# Patient Record
Sex: Female | Born: 1985 | Race: Black or African American | Hispanic: No | Marital: Single
Health system: Midwestern US, Community
[De-identification: ages and names within clinical notes are randomized; demographics above are authoritative.]

## PROBLEM LIST (undated history)

## (undated) DIAGNOSIS — R10A1 Flank pain, right side: Secondary | ICD-10-CM

## (undated) DIAGNOSIS — N200 Calculus of kidney: Secondary | ICD-10-CM

## (undated) DIAGNOSIS — N189 Chronic kidney disease, unspecified: Secondary | ICD-10-CM

## (undated) DIAGNOSIS — E875 Hyperkalemia: Secondary | ICD-10-CM

## (undated) DIAGNOSIS — N186 End stage renal disease: Secondary | ICD-10-CM

## (undated) DIAGNOSIS — I10 Essential (primary) hypertension: Secondary | ICD-10-CM

## (undated) DIAGNOSIS — D62 Acute posthemorrhagic anemia: Secondary | ICD-10-CM

## (undated) DIAGNOSIS — M329 Systemic lupus erythematosus, unspecified: Secondary | ICD-10-CM

## (undated) DIAGNOSIS — N19 Unspecified kidney failure: Secondary | ICD-10-CM

## (undated) DIAGNOSIS — IMO0002 Reserved for concepts with insufficient information to code with codable children: Secondary | ICD-10-CM

## (undated) DIAGNOSIS — I871 Compression of vein: Secondary | ICD-10-CM

## (undated) HISTORY — PX: KIDNEY SURGERY: SHX687

## (undated) HISTORY — PX: CARDIAC SURGERY: SHX584

## (undated) NOTE — Unmapped External Note (Signed)
Formatting of this note might be different from the original.      The patient's case has been endorsed to me by Dr Jeannine Kitten, please see their initial patient care note.  Pt was discussed on rounds, plan of care agreed upon plan as follows:    CT AP and then Admit  Renal failure     Likely Disposition:Admit    SIGN OUT RE-EVALUATION:    Pt is to have a CT and then admit given they are in ESRD and no dialysis.     9:40 AM EST  FINDINGS/IMPRESSION:   *    Vicarious excretion of contrast within the gallbladder.  *    Numerous bilateral renal cystic lesions replacing most of the renal parenchyma. No hydroureteronephrosis. No obstructing renal calculi.  *    Left adnexal cyst measuring up to 3.0 cm.  *    Splenic cystic lesion. Mild gastric wall edema. Diffuse engorged subcutaneous vasculature.  *    No bowel obstruction or distention.      Preliminary findings were published to the electronic medical record by Tish Frederickson, MD.    9:54 AM EST  Pt still with nausea and generalized abd pain.  Noted to also have suprapubic tenderness and bilateral CVA tenderness.     No active vomiting.   Mild headache  Will add on a COVID/Flu panel.   IV morphine and antiemetics. Cosideration to treat UTI (although pt makes only a small amount of urine every day)  She has also missed her dialysi (since Wednesday) and she has M/W/F.   Admit for pain and   Diagnosis(es):  ESRD  Abdominal pain     Disposition:  Admit per sign out from Dr. Jeannine Kitten    Condition:   Stable    The patient verbalizes understanding of the diagnosis and disposition.    Pt will notify their PCP of the ED visit and schedule a follow up.     Pt should return for worsening symptoms.     If any controlled substances or new pain medications/ muscle relaxants are given, please read the insert from the pharmacy regarding activities while taking those medications.    Please do not drink or drive with these medications.   Follow up with your PCP regarding any new medications.      Pt appears to have medical capacity and all questions answered to their satisfaction    Note: translation line was used, if needed.     Alferd Patee, MD    Electronically signed by Alferd Patee, MD at 03/13/2022  9:57 AM EST

## (undated) NOTE — Nursing Note (Signed)
Formatting of this note might be different from the original.  Writer reviewed discharge instructions with pt. Pt packed up all her personal belongings herself. No personal belongings left in room.  Pt left discharge instructions on bed stating we did not do anything for her so she is not taking it.   Electronically signed by Simonne Come, LPN at [phone]  [id number] PM EDT

## (undated) NOTE — H&P (Signed)
Associated Order(s): ED Admission Consult to Hospitalist  Summary: Patient admitted for missed hemodialysis with hyperkalemia nausea and vomiting    Formatting of this note is different from the original.  Consult:  ED Admission Consult to Hospitalist  Consult performed by: Shirley Friar, MD  Consult ordered by: Renold Genta, MD    CC: Missed hemodialysis with dizziness and lightheadedness    History Of Present Illness:  Katherine [name] is a 43 y.o. female presenting with generalized weakness and lightheadedness associated with the fact that she has missed hemodialysis for over a week now.  Patient also reported swelling of the extremities bilaterally and generalized body aches and pains.  Patient reports that she is a traveling [place].  Reports nausea and vomiting but no diarrhea.  Lab work investigations in the ER showed hyperkalemia, 6.4Chest x-ray shows vascular congestion but EKG did not show any changes.  Case discussed with nephrology in the ER we will dialyze patient immediately.  Patient denies any focal weaknesses.    Past Medical History:  She has a past medical history of ESRD (end stage renal disease) (CMS/HCC) and Lupus (CMS/HCC) (HCC) (CMS/HCC).    Surgical History:  She has no past surgical history on file.    Social History:  She has no history on file for tobacco use, alcohol use, and drug use.  Her family history is not on file.    Allergies:  Ace inhibitors, Acetaminophen, Codeine, Fentanyl, Hydrocodone, Oxycodone, Sulfa (sulfonamide antibiotics), Nsaids (non-steroidal anti-inflammatory drug), and Zofran [ondansetron hcl]    Medications:  Prior to Admission medications    Medication Sig Start Date End Date Taking? Authorizing Provider   amLODIPine (NORVASC) 10 mg tablet Take ONE tablet by mouth 1 (one) time each day.    Historical Provider, MD   apixaban (ELIQUIS) 5 mg tablet Take ONE tablet by mouth every 12 (twelve) hours. 04/12/21   Historical Provider, MD   calcitrioL (ROCALTROL) 0.25  mcg capsule Take ONE capsule by mouth 3 (three) times a week. 04/14/21   Historical Provider, MD   losartan (COZAAR) 50 mg tablet Take ONE tablet by mouth 1 (one) time each day. 08/27/21   Historical Provider, MD   metoprolol succinate (TOPROL-XL) 25 mg 24 hr tablet Take ONE tablet by mouth 1 (one) time each day. 06/11/21   Historical Provider, MD       Review of Systems   Constitutional:  Positive for activity change, appetite change and fatigue. Negative for chills, fever and unexpected weight change.   HENT:  Negative for congestion, ear pain, sinus pain and sore throat.    Eyes:  Negative for pain, discharge and redness.   Respiratory:  Negative for cough, chest tightness, shortness of breath and wheezing.    Cardiovascular:  Negative for chest pain, palpitations and leg swelling.   Gastrointestinal:  Negative for abdominal pain, blood in stool, constipation, diarrhea, nausea and vomiting.   Endocrine: Negative for cold intolerance, heat intolerance, polydipsia, polyphagia and polyuria.   Genitourinary:  Negative for decreased urine volume, difficulty urinating, dysuria, flank pain, frequency, hematuria and urgency.   Musculoskeletal:  Negative for back pain, joint swelling, neck pain and neck stiffness.   Skin:  Negative for color change, rash and wound.   Allergic/Immunologic: Negative for immunocompromised state.   Neurological:  Negative for dizziness, tremors, speech difficulty, weakness, light-headedness, numbness and headaches.   Hematological:  Does not bruise/bleed easily.   Psychiatric/Behavioral:  Negative for agitation, confusion, hallucinations, sleep disturbance and suicidal ideas. The  patient is not nervous/anxious.      Last Recorded Vitals:  Blood pressure 135/86, pulse 96, temperature 36.7 C (98.1 F), temperature source Axillary, resp. rate 26, height 1.676 m (5' 6"$ ), SpO2 95 %.    Physical Exam  Constitutional:       General: She is not in acute distress.     Appearance: She is well-developed.  She is ill-appearing. She is not diaphoretic.   HENT:      Head: Normocephalic and atraumatic.      Nose: Nose normal.      Mouth/Throat:      Mouth: Mucous membranes are moist.      Pharynx: Oropharynx is clear.   Eyes:      General: No scleral icterus.     Extraocular Movements: Extraocular movements intact.      Conjunctiva/sclera: Conjunctivae normal.      Pupils: Pupils are equal, round, and reactive to light.   Neck:      Thyroid: No thyromegaly.      Vascular: No JVD.   Cardiovascular:      Rate and Rhythm: Normal rate and regular rhythm.      Pulses: Normal pulses.      Heart sounds: Murmur heard.      No friction rub. No gallop.   Pulmonary:      Effort: Pulmonary effort is normal. No respiratory distress.      Breath sounds: Normal breath sounds. No wheezing or rales.   Chest:      Chest wall: No tenderness.   Abdominal:      General: Bowel sounds are normal. There is no distension.      Palpations: Abdomen is soft. There is no mass.      Tenderness: There is no abdominal tenderness. There is no rebound.   Musculoskeletal:         General: Swelling present. Normal range of motion.      Cervical back: Normal range of motion and neck supple.      Right lower leg: Edema present.      Left lower leg: Edema present.      Comments: Status post right arm HD access   Lymphadenopathy:      Cervical: No cervical adenopathy.   Skin:     General: Skin is warm and dry.      Capillary Refill: Capillary refill takes less than 2 seconds.   Neurological:      General: No focal deficit present.      Mental Status: She is alert and oriented to person, place, and time. Mental status is at baseline.   Psychiatric:         Mood and Affect: Mood normal.     I reviewed the patient's labs, notes, and procedures.     Relevant Results:  I reviewed lab results and outside records for this visit and discussed relevant results with patient and/or family.  Recent Results (from the past 24 hour(s))   ECG 12 lead    Collection Time:  12/23/21  8:51 PM   Result Value Ref Range    Heart Rate 78 bpm    RR Interval 769 ms    Atrial Rate 77 ms    P-R Interval 218 ms    P Duration 136 ms    P Horizontal Axis 10 deg    P Front Axis 46 deg    Q Onset 510 ms    QRSD Interval 97 ms    QT Interval  405 ms    QTcB 462 ms    QTcF 442 ms    QRS Horizontal Axis -36 deg    QRS Axis 34 deg    I-40 Horizontal Axis 5 deg    I-40 Front Axis 29 deg    T-40 Horizontal Axis -78 deg    T-40 Front Axis -4 deg    T Horizontal Axis 42 deg    T Wave Axis 60 deg    S-T Horizontal Axis 74 deg    S-T Front Axis 76 deg   CBC    Collection Time: 12/23/21  9:53 PM   Result Value Ref Range    WBC  4.9 4.5 - 12.5 x10*3/uL    RBC 3.51 (L) 4.20 - 5.40 x10E6/uL    Hemoglobin 11.0 (L) 12.0 - 16.0 g/dL    Hematocrit 33.2 (L) 36.0 - 48.0 %    MCV 94.6 81.0 - 99.0 fL    MCH 31.3 (H) 27.0 - 31.0 pg    MCHC 33.1 31.0 - 36.0 g/dL    RDWSD 60.1 (H) 36.4 - 46.3 fL    Platelets 140 (L) 150 - 450 x10*3/uL    MPV 10.3 7.4 - 10.4 fL    RDWCV 17.7 (H) 11.7 - 14.4 %    nRBC 0.0 %    nRBC Absolute 0.00 0.00 - 0.01 x10*3/uL   Comprehensive Metabolic Panel    Collection Time: 12/23/21  9:53 PM   Result Value Ref Range    Sodium 137 136 - 145 mmol/L    Potassium 6.4 (HH) 3.4 - 4.9 mmol/L    Chloride 96 (L) 98 - 107 mmol/L    CO2 16 (L) 21 - 32 mmol/L    BUN 92 (H) 7 - 25 mg/dL    Creatinine 15.14 (HH) 0.60 - 1.30 mg/dL    Glucose, Random 63 (L) 74 - 109 mg/dL    Calcium 8.3 (L) 8.6 - 10.2 mg/dL    AST 16 13 - 39 U/L    ALT  7 7 - 52 U/L    Alkaline Phosphatase 84 30 - 105 U/L    Protein Total 8.1 6.4 - 8.9 g/dL    Albumin 4.4 3.5 - 5.7 g/dL    Bilirubin Total 0.4 0.3 - 1.0 mg/dL    eGFR 2.9 (L) >60.0 mL/min/1.55m2    Anion Gap 25 (H) 1 - 11 mmol/L   Lipase    Collection Time: 12/23/21  9:53 PM   Result Value Ref Range    Lipase 22 11 - 82 U/L   Magnesium    Collection Time: 12/23/21  9:53 PM   Result Value Ref Range    Magnesium 2.7 1.9 - 2.7 mg/dL   POCT glucose meter docked device    Collection Time:  12/24/21  2:52 AM   Result Value Ref Range    Glucose bgl- 35 mg/dl    POCT glucose meter    Collection Time: 12/24/21  2:52 AM   Result Value Ref Range    Glucose 35 (LL) 74 - 106 mg/dL   POCT glucose meter    Collection Time: 12/24/21  3:21 AM   Result Value Ref Range    Glucose 49 (LL) 74 - 106 mg/dL   POCT glucose meter    Collection Time: 12/24/21  3:40 AM   Result Value Ref Range    Glucose 60 (L) 74 - 106 mg/dL   POCT glucose meter    Collection Time: 12/24/21  4:56 AM  Result Value Ref Range    Glucose 106 74 - 106 mg/dL   POCT glucose meter docked device    Collection Time: 12/24/21  4:57 AM   Result Value Ref Range    Glucose bgl-106 mg/dl      X-ray Chest 1 View  FINDINGS:  Mild cardiomegaly. Borderline vascular congestion without overt edema. Interval  placement of graft material in the brachiocephalic/SVC    IMPRESSION:  Borderline vascular congestion    Assessment/Plan:   #.  End-stage renal disease with missed hemodialysis sessions       Admit to medical floor telemetry       Nephrology consult for hemodialysis    #.  Hyperkalemia secondary to end-stage renal disease: Patient has already received initial treatment with calcium gluconate IV insulin and Lokelma.,  Continue hemodialysis and monitor levels post hemodialysis and treat accordingly.  May need to hold off losartan hyperkalemia is corrected.    #.  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  History of lupus: Continue home medications and follow-up with rheumatology as scheduled.    #.  Nausea with intermittent vomiting: IV promethazine as needed    Continue with all other pre admission medications as appropriate  SCDs  and lovenox for DVT prophylaxis  Protonix for GI prophylaxis  Pt is high risk for decompensation  Expected length of stay more than 2 midnights  Plan of care discussed with patient    Medical Decision Making  Refer to assessment and plan for further details    Amount and/or Complexity of Data  Reviewed  Independent Historian:      Details: Obtained from patient  Labs: ordered.     Details: Labs ordered to follow response to treatment.  Radiology: independent interpretation performed.     Details: Reviewed  ECG/medicine tests: independent interpretation performed.     Details: Reviewed    Risk  Prescription drug management.  Drug therapy requiring intensive monitoring for toxicity.  Decision regarding hospitalization.  Risk Details: Is high risk for decompensation.      Electronically signed by Shirley Friar, MD at 12/24/2021  7:44 AM EDT

## (undated) NOTE — Nursing Note (Signed)
Formatting of this note might be different from the original.  Pt arrived to the floor escorted by [employer] transportation. Pt oriented to room. Pt states she wants a new MD. Charge nurse notified and I will notify MD in MDR at 1300.  Electronically signed by Simonne Come, LPN at [phone] [id number] PM EDT

## (undated) NOTE — ED Notes (Signed)
Formatting of this note might be different from the original.  Pt on call light states she wants a new doctor as this current one does not seemed concerned about her at all. Pt notified pain meds requested not received. Pt sitting at bedside on home computer eating salad at present time in no acute distress  Electronically signed by Roxanne Mins, RN at 07/01/2022  8:54 PM EDT

## (undated) NOTE — Progress Notes (Signed)
Formatting of this note is different from the original.    [place] Team E Progress Notes      Patient ID: [facility] day:  2  Admit Date: 03/12/2022 11:27 PM  Today's date: 03/15/2022    Summary Statement     Katherine [name] is a 46 y.o. female with a PMHx of  Lupus, ESRD, Superior Vena Cava Syndrome, and HTN  admitted for abdominal pain. Patient states she has been having nausea for the last couple of days. Complains of supra pubic pain that wraps around to the back. Endorses some increased urinary frequency and occasional dysuria. Patient tolerated lunch at bedside. S/p HD today.     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)    Secondary hyperparathyroidism of renal origin (CMS-HCC)     Subjective     HD went well today, Completed full lunch at bedside today     Objective    Vital Signs   Temp:  [36.5 C (97.7 F)-37.2 C (99 F)] 36.5 C (97.7 F)  Heart Rate:  [55-78] 78  Respirations:  [18] 18  Blood Pressure: (126-155)/(77-103) 152/88  BP (!) 152/88   Pulse 78   Temp 36.5 C (97.7 F) (Tympanic)   Resp 18   Ht 1.676 m (5' 6"$ )   Wt 95.5 kg (210 lb 9.6 oz)   SpO2 100%   BMI 33.99 kg/m      Intake/Output Summary (Last 24 hours) at 03/15/2022 1414  Last data filed at 03/15/2022 1030  Gross per 24 hour   Intake 500 ml   Output 2500 ml   Net -2000 ml       Physical Exam  General appearance: Pt is not  in NAD. The patient is conversant.   Eyes: Anicteric sclerae, moist conjunctivae; no lid-lag  HENT: Atraumatic  Neck: Trachea midline. FROM, supple, tortuous veins noted  Lungs: Trachea in midline. Equal chest expansions bilaterally. Clear breath sounds heard bilaterally.  No wheezes or rales appreciated. No peripheral cyanosis or clubbing.   CV: Regular rate and rhythm. Normal S1/S2. No S3/S4. Systolic murmur noted   Abdomen:  Non-distended. No visible pulsations. Normoactive bowel sounds. No voluntary or involuntary guarding; or rebound tenderness; no obvious organomegaly. Mild tenderness noted   Musculoskeletal: Normal bulk and tone, left UE fistula noted, RUE healed fistula noted   Skin: Normal temperature, turgor and texture  Neurological:  CN 2-12 are grossly intact without any deficits.   Psych: Appropriate affect, alert and oriented to person, place and time     Medications    Scheduled Medications   normal saline  10 mL Intravenous EVERY 8 HOURS    AmLODIPine  10 mg Oral Daily    apixaban  5 mg Oral BID    cefTRIAXone  1 g IV PUSH Q24H    sevelamer carbonate  1,600 mg Oral TID w/meals     Continuous Infusions     PRN Medications  promethazine, morphine, albumin human, [COMPLETED] Insert Midline Catheter **AND** Maintain Midline Catheter **AND** normal saline **AND** sodium chloride, [COMPLETED] Insert Peripheral IV **AND** Maintain Peripheral IV **AND** normal saline **AND** sodium chloride, sennosides, polyethylene glycol, ondansetron    Labs    Recent Labs     03/13/22  0126 03/15/22  1003  NA 139 135   K 4.2 3.5   CL 97* 93*   CO2 25 29   BUN 28* 23*   CREATININE 8.3* 6.8*   GLU 82 76   MG  --  1.9   PHOS  --  2.9   PROT 7.0 6.8     Recent Labs     03/13/22  0126 03/15/22  0924   WBC 2.2* 2.2*   HGB 10.4* 9.5*   HCT 32.4* 28.2*   PLT 132* 123*   MCH 30.9 31.4   MCHC 32.1 33.6   MCV 96.0 94.0     No results for input(s): "PT", "APTT", "INR" in the last 72 hours.    No results for input(s): "PHART", "PCO2ART", "PO2ART" in the last 72 hours.    No results for input(s): "CKMB", "TROPONINI", "BNP" in the last 72 hours.    Invalid input(s): "CPKTOTAL"    Urine Analysis      Component Ref Range & Units 03/13/22 0426   COLOR-URINE Yellow, Straw, Colorless Yellow   CLARITY-URINE Clear, Hazy Hazy   SPECIFIC GRAVITY-URINE 1.001 - 1.035 1.009   PH, QUANT-URINE 5.0 - 8.0 8.0   PROTEIN,QUALITATIVE-URINE Negative <30 mg/dL 2+=100 mg/dL  Abnormal    GLUCOSE,QUALITATIVE-URINE Negative < 50 mg/dL Negative < 50 mg/dL   KETONES,QUALITATIVE-URINE Negative < 5 mg/dL Negative < 5 mg/dL   BILIRUBIN,QUALITATIVE-URINE Negative < 2.0 mg/dL Negative < 2.0 mg/dL   UROBILINOGEN,QUALITATIVE-URINE Negative <2.0 mg/dL Negative <2.0 mg/dL   HEMOGLOBIN,QUALITATIVE-URINE Negative <0.03 mg/dL Negative <0.03 mg/dL   NITRITE,BACTERIAL-URINE Negative Negative   LEUKOCYTE ESTERASE-URINE Negative <25 WBC/uL 3+=500 WBC/uL Abnormal    WBC-URINE None / HPF, <2 / HPF > 50 / HPF Abnormal    RBC-URINE < 3 / HPF 3-10 / HPF Abnormal    SQUAMOUS EPITHELIAL CELL-URINE None / HPF, <2 / HPF 6 - 10 /HPF Abnormal    BACTERIA-URINE (none) Present Abnormal        Imaging:    03/13/22  Narrative & Impression   EXAM: CT ABDOMEN AND PELVIS WITH IV CONTRAST ONLY    CLINICAL INDICATION:  Abdominal pain, acute, nonlocalized     TECHNIQUE: Following administration of non-ionic IV contrast, postcontrast images through the abdomen and pelvis were obtained.    COMPARISON: None    FINDINGS:   Lower Thorax: Normal.    Liver: No suspicious hepatic lesions. Mild periportal edema.    Gallbladder/Biliary Tree: Excreted contrast within the gallbladder lumen    Spleen: Indeterminate hypoattenuating lesion within the spleen measuring up to 1.6 cm    Pancreas: No pancreatic ductal dilation.    Adrenal Glands: Normal.    Kidneys/Ureters: Innumerable cystic lesions within the bilateral kidneys most of which are too small to characterize. No hydronephrosis.    Gastrointestinal: No bowel obstruction. Colonic diverticulosis without inflammation.     Bladder: Decompressed.    Uterus/Adnexa: 3.0 x 1.9 cm cystic lesion of the left adnexa likely represents a benign cyst.    Lymph Nodes: No pathologically enlarged lymph nodes    Vessels: Aorta is normal in caliber. Proximal most celiac trunk and proximal most SMA are patent. Main portal vein is patent.    Peritoneum/Retroperitoneum: No organized fluid  collections.    Bones/Soft Tissues: Diffuse abdominal wall collaterals as well as partially visualized chest wall collaterals.    *  IMPRESSION:   1.    No acute intra-abdominal or intrapelvic processes.  2.    Innumerable bilateral renal cystic lesions most of which are  too small to characterize which could be secondary to polycystic renal disease. No hydronephrosis  3.    Diffuse engorged subcutaneous vessels involving the partially visualized thoracic subcutaneous soft tissues as well as the abdominal wall. This is of uncertain etiology.  4.    Additional chronic/incidental findings, as above.      Assessment and Plan   Katherine [name] is a 42 y.o. female with a PMHx of Lupus, ESRD, Superior Vena Cava Syndrome, and HTN  admitted for abdominal pain.       #Abdominal Pain   Assessment:  - Lipase: 30, Liver Enzymes unremarkable   - Complaints of persistent nausea and abdominal pain   - CT AB Reading:   1.    No acute intra-abdominal or intrapelvic processes.  2.    Innumerable bilateral renal cystic lesions most of which are too small to characterize which could be secondary to polycystic renal disease. No hydronephrosis  3.    Diffuse engorged subcutaneous vessels involving the partially visualized thoracic subcutaneous soft tissues as well as the abdominal wall. This is of uncertain etiology.  4.    Additional chronic/incidental findings, as above.  - Patient has had several ED visits and admission   - Patient tolerated lunch today   Plan:  - Pain management 27m morphine Q6 prn   - Zofran 475mIV Q8prn fo nausea   - Will add Promethazine   - Advance diet as tolerated   - Follow up final read CTAB   - Continue to monitor     #ESRD   Assessment:  - On HD MWF endorses missing some sessions but recently [place]"  - S/p HD today   Plan:  - Appreciate recommendations per nephrology       #SLE   Assessment:  - Does not appear to be on any specific  management  Plan:  - Can consider Rheum consult if patient fails to improve     #Hypertension   Assessment:  - Per patient is only taking Amlodipine 1065m - BP 150s/90s may be 2/2 to c/o of pain and discomfort  Plan:  - Continue home med  - Continue to monitor       #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   Assessment:  - Home Med: Apixiban 5mg47mD   Plan:  - Continue home medication     #Concern for UTI   Assessment:  - States that she has had increased frequency with occasional dysuria   - UA is LE positive   Plan:  - Continue Ceftriaxone     -----------------------------------------------------------------------------------------------  Activity                         :  As tolerated   Diet                              :  Regular   Prophylaxis                 :  On Home Anticoagulation   Consults                      :  Nephrology  Code status                 :  Full  Disposition                  :   Pending clinical advancement   Discharge planning     :   Pending    Amparo Bristol, MD  Family Medicine, PGY-3  [place] Team H  Phone: [phone]    Please call Cross Cover Mon-Fri after 3pm, and Sat-Sun after 12pm  [facility] Deliah Goody team Pray cover: [phone]   [facility] team Cross cover: [phone]   [facility] Nighttime Cross cover: [phone] (7pm-7am)     Electronically signed by Hildred Laser, MD at 03/16/2022 10:31 AM EST    Associated attestation - Hildred Laser, MD - 03/16/2022 10:31 AM EST  Formatting of this note might be different from the original.  Patient seen and examined.  The resident team and I discussed the patient and agree with assessment plan.

## (undated) NOTE — Consults (Signed)
Formatting of this note might be different from the original.  Initial Care Management Discharge Planning Assessment    Per chart, 32 y.o. female with a PMHx of  Lupus, ESRD, Superior Vena Cava Syndrome, and HTN  admitted for abdominal pain. Patient states she has been having nausea for the last couple of days. States that she is not experiencing vomiting or diarrhea. Nausea is made worse by eating, was able to tolerate juice at bedside. Complains of supra pubic pain that wraps around to the back. Endorses some increased urinary frequency and occasional dysuria. States that she has multiple ED visits due to her chronic conditions. Last dialysis she states was "Monday". States she is visiting a sick cousin for the holidays but primary residence is in [place].     Current living environment:   Address: [place] [address]   Has address been verified? Yes    Updated Address (if applicable): N/A     Residential type: Lives alone      Legal next of kin/emergency contact: Did not provide one    Payor source (Please contact financial services to verify payor source.):   Payor 1: [place] 2 (if applicable): None  Payor 3 (if applicable): None    Services prior to admission: Paediatric nurse: HD (MWF) with [employer], [place]    High risks factors: Hospitalization related to CHF, COPD, DM, End Stage Disease, CVA, Cancer, Sickle Cell, Poor social supports, Comorbidities, and Life altering diagnosis    Current functional status: Level I: Independent. Able to complete all mobility and activities of daily living without assistance.      Do you have stairs to enter your home ? no    If so how many ?  0    Mode of transportation at discharge: Self    Where will medications be filled at discharge? (insert address/phone number of non-[facility] locations, if known): [facility]  *NOTE: If the patient does not know the actual address, a  location can be used instead (i.e. "[facility]    Discharge Planning Guide given? no    Discharge Planning Guide reviewed with: Patient    Concern about returning to current living environment? None    Projected discharge plan: Return to [place]. Per patient she has a bus ticket to [place], when asked the time on the ticket, she stated afternoon, not specific about the time.    [facility]  [address]  Rm [id number]  [place] [address]  Phone: [phone] Fax: [phone]    Yates Decamp, [place]        Electronically signed by Yates Decamp, [place] at 03/16/2022  2:37 PM EST

## (undated) NOTE — Unmapped External Note (Signed)
Formatting of this note might be different from the original.    Problem: Pain/Comfort  Goal: Adequate pain control while hospitalized  Outcome: Continue with Current Goal    Problem: Fall Risk  Goal: Patient will remain as independent as possible  Outcome: Continue with Current Goal    Problem: Fall Risk  Goal: Patient will have lower fall risk. Lower injury risk.  Outcome: Continue with Current Goal    Problem: Fall Risk  Goal: Patient will remain safe from falls and injury  Outcome: Continue with Current Goal    Electronically signed by Rolan Lipa, RN at 03/13/2022 10:16 PM EST

## (undated) NOTE — Unmapped External Note (Signed)
Formatting of this note might be different from the original.    Problem: Pain/Comfort  Goal: Adequate pain control while hospitalized  Outcome: Continue with Current Goal    Problem: Fall Risk  Goal: Patient will remain as independent as possible  Outcome: Continue with Current Goal  Goal: Patient will have lower fall risk. Lower injury risk.  Outcome: Continue with Current Goal  Goal: Patient will remain safe from falls and injury  Outcome: Continue with Current Goal  Goal: Patient/family will understand fall prevention measures  Outcome: Continue with Current Goal  Goal: Patient/family will understand injury reduction measures  Outcome: Continue with Current Goal  Goal: Patient/family will comply with fall program  Outcome: Continue with Current Goal  Goal: Patient/family will verbalize fall prevention strategies to implement after discharge  Outcome: Continue with Current Goal    Electronically signed by Carron Curie, RN at 03/16/2022  8:28 AM EST

## (undated) NOTE — Nursing Note (Signed)
Formatting of this note might be different from the original.  Pt signed of tx AMA. Nephrologist notified. She received approximately 3 hrs and 10 min tx. Removed 2580 ml. Pt received pain and nausea medications during tx. Post VSS. NAD noted. Pt is able to reposition self. Pt's status is unchanged. Pt's condition is stable to return to floor.        Electronically signed by Lake Bells, RN at 12/24/2021 11:22 AM EDT

## (undated) NOTE — Nursing Note (Signed)
Formatting of this note might be different from the original.  Patient arrived to the unit with transport,ambulated to the room without assistance and AIDET performed.Admission,skin assessment and CHG completed..Oriented to the room,belongings at bedside with patient informed.One episode of emesis ,prn pain medicine given times two rating pain  at 10/10. Has urinalysis pending, patient educated to call when sample is available.  Fluids and snacks given,call light within reach.  Electronically signed by Rolan Lipa, RN at 03/14/2022  4:47 AM EST

## (undated) NOTE — ED Notes (Signed)
Formatting of this note might be different from the original.  Assumed of patient sleeping in hall bed who was transferred from [facility] for dialysis. Pt was at [facility] prior to [facility] long and left AMA. Pt states everyone is just ignoring me. Pt c/o right sided flank pain x 1 week missed dialysis x 2 days and headache. Pt wants pain and nausea meds but was just given phenergan and is lethargic at present time. Pt has fistula in right upper arm + thrill/bruite. Pt slightly htn at 155/101 all other vs wnl  Electronically signed by Roxanne Mins, RN at 07/01/2022  7:59 PM EDT

## (undated) NOTE — ED Provider Notes (Signed)
Formatting of this note is different from the original.    [facility]    ED Provider Note    Katherine [name] 55 y.o. female DOB: 14-Dec-1985 MRN: [id number]  History     Chief Complaint   Patient presents with   ? Throat Pain     Pt c/o throat pain/swelling that radiates to R side of chest and abdomen, HA, N/V/D since last night.      This is a 36 year old female with a complicated medical history which includes end-stage renal disease on dialysis, hypertension, lupus nephritis, SVC syndrome, presents with multiple complaints.  Patient states that she is fatigued and has had some nausea and vomiting over the past 24 hours.  Of note: This is the patient's third emergency department visit in less than 24 hours.  She was seen at [facility] for numerous complaints yesterday followed by a subsequent visit at this hospital earlier today and now presents again.  Patient states she is in from out of town visiting family.  She normally follows at [place].  Patient endorses compliance with dialysis.  Denies fever, chills or trauma.    Past Medical History:   Diagnosis Date   ? Dialysis patient (*)    ? Hypertension    ? Lupus nephritis (*)    ? Superior vena cava syndrome      No past surgical history on file.    Social History     Substance and Sexual Activity   Alcohol Use Never     Social History     Tobacco Use   Smoking Status Never   Smokeless Tobacco Never     E-Cigarettes   ? Vaping Use     ? Start Date     ? Cartridges/Day     ? Quit Date       Social History     Substance and Sexual Activity   Drug Use Never           Allergies   Allergen Reactions   ? Ace Inhibitors Unknown   ? Acetaminophen Unknown   ? Fentanyl Unknown   ? Hydrocodone Unknown   ? Nsaids Unknown   ? Oxycodone Unknown   ? Roxicodone Unknown   ? Sulfa Antibiotics Unknown     Discharge Medication List as of 11/27/2021  6:31 AM     CONTINUE these medications which have NOT CHANGED    Details   ondansetron  (ZOFRAN-ODT) 4 mg disintegrating tablet Take one tablet (4 mg dose) by mouth every 8 (eight) hours as needed for up to 7 days., Starting Fri 11/26/2021, Until Fri 12/03/2021 at 2359, Print         Review of Systems     Review of Systems   Constitutional: Negative for appetite change, fatigue and fever.   HENT: Negative for trouble swallowing.    Respiratory: Negative for cough, chest tightness and shortness of breath.    Cardiovascular: Negative for chest pain.   Gastrointestinal: Positive for nausea and vomiting. Negative for abdominal pain and diarrhea.   Endocrine: Negative for polyuria.   Genitourinary: Negative for difficulty urinating, vaginal bleeding and vaginal discharge.   Musculoskeletal: Negative for arthralgias, back pain and neck pain.   Skin: Negative for rash.   Neurological: Negative for weakness and headaches.   Psychiatric/Behavioral: Negative for sleep disturbance.     Physical Exam     ED Triage Vitals [11/27/21 0229]   BP (!) 166/85  Heart Rate 116   Resp 16   SpO2 95 %   Temp 97.5 F (36.4 C)     Physical Exam   Nursing note and vitals reviewed.  Constitutional: She appears well-developed and well-nourished.   HENT:   Head: Normocephalic and atraumatic.   Mouth/Throat: Voice normal.   Eyes: EOM are intact. Conjunctivae are normal. Pupils are equal, round, and reactive to light.   Neck: Normal range of motion and voice normal. Neck supple.   Cardiovascular: Normal rate, regular rhythm, S1 normal and S2 normal.   Audible murmur.    Systolic murmur is present.No friction rub and gallop.   Prominent anterior/superior chest wall veins are noted into her right shoulder, chest and neck.   Pulmonary/Chest: Respiratory effort normal. No wheezing. No rhonchi. No rales.  Abdominal: Soft. There is no abdominal tenderness. There is no guarding and no rebound. Bowel sounds are normal. There is no CVA tenderness. No ascites present.   Musculoskeletal: No obvious deformity noted to extremities.      Cervical  back: Normal range of motion and neck supple. no edema.    Lymphadenopathy:     No cervical adenopathy.   Neurological: She is alert and oriented to person, place, and time. Moves all extremities equally. She has normal speech. Cranial nerves intact II through XII. She has normal strength.   Skin: Skin is warm. No rash noted.   Psychiatric: She has a normal mood and affect. Thought content normal.     ED Course     Lab results:    CBC AND DIFFERENTIAL - Abnormal       Result Value    WBC 3.8      RBC 3.41 (*)     HGB 10.3 (*)     HCT 32.2 (*)     MCV 94      MCH 30.2      MCHC 32.0      Plt Ct 157      RDW SD 54.7 (*)     MPV 9.9      NRBC% 0.0      NRBC 0.000      NEUTROPHIL % 50.6      LYMPHOCYTE % 30.5      MONOCYTE % 9.3      Eosinophil % 7.4 (*)     BASOPHIL % 1.9      IG% 0.300      ABSOLUTE NEUTROPHIL COUNT 1.91      ABSOLUTE LYMPHOCYTE COUNT 1.2      MONO ABSOLUTE 0.4      EOS ABSOLUTE 0.3      BASO ABSOLUTE 0.1      IG ABSOLUTE 0.010     COMPREHENSIVE METABOLIC PANEL - Abnormal    Na 136      Potassium 4.9      Cl 96 (*)     CO2 19 (*)     AGAP 21 (*)     Glucose 67      BUN 60 (*)     Creatinine 11.65 (*)     Ca 8.5 (*)     ALK PHOS 135      T Bili 0.15      Total Protein 7.5      Alb 4.6      GLOBULIN 2.9      ALBUMIN/GLOBULIN RATIO 1.6      BUN/CREAT RATIO 5.2 (*)     ALT 11      AST  9      eGFR 4      Comment: Normal GFR (glomerular filtration rate) > 60 mL/min/1.73 meters squared, < 60 may include impaired kidney function.  Calculation based on the Chronic Kidney Disease Epidemiology Collaboration (CK-EPI)equation refit without adjustment for race.   COVID-19, FLU A+B AND RSV - Normal    Flu A Negative      Flu B Negative      RSV PCR Negative      SARS-COV-2 Not Detected      Narrative:     SARS-COV-2 (COVID-19)PCR-Negative results do not preclude SARS-CoV-2 infection and should not be used as the sole basis for patient management decisions. Negative results must be combined with clinical  observations, patient history, and epidemiological information.    Flu and/or RSV - Negative results do not preclude the presence of Flu or RSV virus and should not be used as the sole basis for treatment or other patient management decisions. False negative results may occur if virus is present at levels below the analytical limit of detection.    This test detects Influenza A, Influenza B, and Respiratory Syncytial Virus and SARS-COV-2 (COVID-19) by PCR.    Testing was performed using the CEPHEID SARS-CoV-2 EUA ASSAY:   The Cepheid SARS-CoV-2 EUA assay has not been FDA cleared or approved.  It has been authorized by FDA under an Emergency Use Authorization (EUA).  The test has been authorized only for the detection of nucleic acid from SARS-CoV-2, not for any other viruses or pathogens.  It is only authorized for the duration of time the declaration that circumstances exist justifying the authorization of the emergency use of in vitro diagnostic tests for detection of SARS-CoV-2 virus and/or diagnosis of COVID-19 infection under section 564(b) (1) of the Act, 21 U.S.C [phone] (b) (1), unless the authorization is terminated or revoked sooner.     Link to Patient Fact Sheet:   PinkCheek.be     Link to Provider Fact Sheet   GravelBags.it      BETA STREP SCREEN THROAT - Normal    Strep A Negative     LIPASE - Normal    Lipase 50     HUMAN CHORIONIC GONADOTROPIN (HCG), BETA-SUBUNIT, QUALITATIVE - Normal    Beta HCG Qual Negative     CULTURE, THROAT   LIGHT BLUE TOP   GOLD SST     Imaging:  No data to display    ECG:  ECG Results    None                                  Pre-Sedation  Procedures      Medical Decision Making  Differential diagnosis includes metabolic derangement, volume overload, infectious etiology, traumatic etiology or other.    Again: This is patient's third ED visit in the past 24 hours.  [facility] abnormality.    Laboratory evaluation was again repeated this evening: No significant changes are noted.    Patient's vital signs are within a reasonable limit.  Heart rate on my exam is in the mid to upper 80s.    Prior records reviewed: Please see HPI, the patient also with approximately 21 ED visits in the past 2 months between [name] as well as [facility].  After review of these records I feel the patient is  at her baseline regarding her chronic disease and requires no further work-up here.    Patient is amenable to the plan.  She has no further complaint.    Patient appears non-toxic and is hemodynamically stable at this time.     Katherine [name] has been re-evaluated and is ready for discharge. Reviewed available results with her. I have counseled her on diagnosis and care plan, she has expressed understanding, and all questions have been answered. Patient agrees with plan and agrees to F/U as recommended, or return to the ED if her sxs worsen. Discharge instructions have been provided and explained to this patient, along with reasons to return to the ED.     Amount and/or Complexity of Data Reviewed  Labs: ordered.    Provider Communication    Discharge Medication List as of 11/27/2021  6:31 AM       Discharge Medication List as of 11/27/2021  6:31 AM       Discharge Medication List as of 11/27/2021  6:31 AM       Clinical Impression     Final diagnoses:   SVC syndrome     ED Disposition     ED Disposition   Discharge    Condition   Stable    Comment   --                  Follow-up Information     [facility] Emergency Department.    Specialty: Emergency Medicine  Comments: As needed  Contact information:  [place] [phone]  [phone]                Electronically signed by:    Graylon Good, PA-C  11/27/21 2359    Electronically signed by Tonye Pearson, MD at 11/28/2021  4:05 AM EDT    Associated  attestation - Tonye Pearson, MD - 11/28/2021  4:05 AM EDT  Formatting of this note might be different from the original.  I have reviewed and agree with the APP's findings and plan for this patient.  Tonye Pearson, MD  Emergency Department - 11/28/2021 4:05 AM

## (undated) NOTE — Unmapped External Note (Signed)
Formatting of this note is different from the original.  Katherine [name]  Admit Date: 07/01/2022  07/01/2022  Katherine [name]  Requesting Physician:  Fredderick Phenix MD    Reason for Consult:  ESRD, Hyperkalemia  HPI:   25F ESRD THS in [place] with [facility] presented to Katherine [name], ED earlier today after missing dialysis and found to have hyperkalemia with a potassium of 6.8.  Thus far treated with Lokelma, insulin/dextrose, albuterol.  She was seen yesterday in the [facility] emergency room.  Notes have been reviewed.  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      She had missed least 2 outpatient treatments prior to her presentation yesterday at [facility].  It appears that she left AMA before receiving dialysis yesterday and her potassium was 5.7.  She was seen by nephrology there.    In interview with the patient she is defiant and somewhat angry.  She starts her conversation asking for pain medicines and nausea medicines.  She declines to tell me the reason that she is come to [place].  It sounds like it is connected to family matters.    She has a right upper extremity AV fistula with no concerns.  Chest x-ray is clear without active cardiopulmonary disease.  Hemoglobin is 8.0 it was 8.7 yesterday.  Imaging yesterday included a CT of the abdomen and pelvis with contrast demonstrating an evolving and decreased in size left subcapsular renal hematoma without evidence of hydronephrosis.    EKG with normal sinus rhythm.  Some peaked T's in leads II, III, V4, V5    ROS  Balance of 12 systems is negative w/ exceptions as above    PMH   Past Medical History:   Diagnosis Date    Kidney failure     Lupus      PSH History reviewed. No pertinent surgical history.  FH History reviewed. No pertinent family history.  SH  reports that she  has never smoked. She has never used smokeless tobacco. She reports that she does not drink alcohol and does not use drugs.  Allergies   Allergies   Allergen Reactions    Acetaminophen Anaphylaxis    Codeine Anaphylaxis    Enalapril Anaphylaxis    Fentanyl Anaphylaxis    Hydrocodone Anaphylaxis    Oxycodone Anaphylaxis    Sulfa Antibiotics Anaphylaxis and Other (See Comments)     Other reaction(s): Anaphylaxis    Nsaids      Not an actual allergy, pt cannot take as a dialysis pt     Home medications  Prior to Admission medications    Medication Sig Start Date End Date Taking? Authorizing Provider   amLODipine (NORVASC) 10 MG tablet Take 10 mg by mouth daily. 04/12/17   [provider]   calcium acetate (PHOSLO) 667 MG capsule Take 667 mg by mouth 3 (three) times daily with meals.    [provider]   HYDROmorphone (DILAUDID) 2 MG tablet Take 0.5 tablets (1 mg total) by mouth every 4 (four) hours as needed for severe pain. 09/23/17   Jacalyn Lefevre, MD   ondansetron (ZOFRAN-ODT) 4 MG disintegrating tablet Take 1 tablet (4 mg total) by mouth every 8 (eight) hours as needed for up to 15 doses for nausea or vomiting. 11/30/21   Trifan, Kermit Balo, MD  promethazine (PHENERGAN) 25 MG tablet Take 1 tablet (25 mg total) by mouth every 6 (six) hours as needed for nausea or vomiting. 09/23/17   Jacalyn Lefevre, MD     Current Medications  Scheduled Meds:  Continuous Infusions:   promethazine (PHENERGAN) injection (IM or IVPB) Stopped (07/01/22 1915)     PRN Meds:.promethazine (PHENERGAN) injection (IM or IVPB)    CBC  Recent Labs   Lab 07/01/22  1420   WBC 3.1*   HGB 8.0*   HCT 24.4*   MCV 90.7   PLT 162     Basic Metabolic Panel  Recent Labs   Lab 07/01/22  1420   NA 129*   K 6.8*   CL 94*   CO2 21*   GLUCOSE 80   BUN 75*   CREATININE 11.87*   CALCIUM 7.4*     Physical Exam  Blood pressure (!) 155/101, pulse 91, temperature (!) 97.5 F (36.4 C), temperature source Oral, resp. rate 15, height 5\' 6"  (1.676 m),  SpO2 100 %.  GEN: NAD, sitting at the edge of the bed  ENT: NCAT  EYES: EOMI  CV: Regular, systolic murmur noted  PULM: Clear bilaterally, normal work of breathing  ABD: Soft, nontender  SKIN: No rashes or lesions  EXT: No peripheral edema  VASCULAR: Bruit and thrill present of right upper arm AV fistula  NEURO: Nonfocal, CN II through XII grossly intact    Assessment  28F ESRD with Hyperkalemia after missing HD several times.     ESRD on HD, [facility] in [place], [place] AVF  Hyperkalemia, moderate with EKG changes  Anemia  Recent L subcapsular hematoma, improving it appears  HTN  SLE, hx/o LN, APLA and SVC syndrome  Hypocalcemia, ASx mild  Mild hyponatremia    Plan  HD tonight, 2K, 2-3L UF, AVF, no heparin.    No indications for admission, will return to ED post HD for reassessment and disposition    Katherine [name]   07/01/2022, 8:30 PM      Electronically signed by Katherine Miss, MD at 07/01/2022  8:40 PM EDT

## (undated) NOTE — ED Triage Notes (Signed)
Formatting of this note might be different from the original.  N/V and flank pain since Wed afternoon.  Electronically signed by Carmell Austria, RN at 07/01/2022 12:30 PM EDT

## (undated) NOTE — ED Triage Notes (Signed)
Formatting of this note might be different from the original.  Pt A+Ox4 with C/O neck pain, leg pain, flank pain and stomach pain x 2 days. She has been having nausea and vomiting x 2 days with minimal blood. She also C/O minimal blood in her stool since 2 days ago. Swelling to legs and neck since yesterday. [place] and hit her head yesterday. Had no LOC. She does dialysis M/W/F. Her last dialysis was last week WED. H/O ESRD, HTN.  Electronically signed by Merrily Brittle, RN at 12/24/2021 12:44 AM EDT

## (undated) NOTE — ED Triage Notes (Signed)
Formatting of this note might be different from the original.  Pt BIB EMS from [facility]. Per EMS, pt has had increasing right flank pain over the last several days. Pt was seen at another hospital prior to [facility] and left AMA because she felt she was being treated properly. Pt ended up missing dialysis. Flank pain persists. A/Ox4. VSS.   Electronically signed by Jeremy Johann, RN at 07/01/2022  6:13 PM EDT

## (undated) NOTE — Unmapped External Note (Signed)
Formatting of this note might be different from the original.    Problem: Knowledge Deficit  Goal: Patient will Participate in learning opportunities offered by staff  Outcome: Continue with Current Goal    Problem: Infection/Isolation (Risk/Actual)  Goal: Patient will be free of hospital acquired infection throughout hospitalization  Outcome: Continue with Current Goal    Problem: Pain/Comfort  Goal: Adequate pain control while hospitalized  Outcome: Continue with Current Goal    Problem: Fall Risk  Goal: Patient will remain as independent as possible  Outcome: Continue with Current Goal  Goal: Patient will have lower fall risk. Lower injury risk.  Outcome: Continue with Current Goal  Goal: Patient will remain safe from falls and injury  Outcome: Continue with Current Goal  Goal: Patient/family will understand fall prevention measures  Outcome: Continue with Current Goal    Electronically signed by Duayne Cal, RN at 03/15/2022  8:13 PM EST

## (undated) NOTE — ED Notes (Signed)
Formatting of this note might be different from the original.  Patient has refused other vital signs at this time    Electronically signed by Carleene Overlie, Paramedic at 07/01/2022  5:02 PM EDT

## (undated) NOTE — ED Notes (Signed)
Formatting of this note might be different from the original.  Patient to nurses station demanding to speak to supervisor above CN head is to be transported to dialysis   Electronically signed by Roxanne Mins, RN at 07/01/2022 10:42 PM EDT

## (undated) NOTE — ED Notes (Signed)
Formatting of this note might be different from the original.  Pt given urine specimen cup and wet prep for self swab. Educated on indication and how to collect specimens, states she would like to wait until she has to urinate to collect.   Electronically signed by Pricilla Larsson, RN at 03/13/2022  1:38 AM EST

## (undated) NOTE — ED Notes (Signed)
Formatting of this note might be different from the original.  Pts IV occluded. IV team brought down and could not find a suitable spot that the pt would allow them to attempt an IV. Pt became agitated when IV team wanted to try a previously attempted IV location. This RN explained to pt that that was the only suitable spot available, pt became extremely rude and accused the staff of racism. RN informed pt they let the MD know of their refusal.  Electronically signed by Daleen Squibb, RN at 07/01/2022  4:36 PM EDT

## (undated) NOTE — H&P (Signed)
Formatting of this note is different from the original.  [facility] Medicine History & Physical Note   Patient ID: [facility] day:  0  Admit Date: 03/12/2022 11:27 PM  Today's date: 03/13/2022    Chief Complaint: Abdominal Pain     HPI:   Katherine [name] is a 25 y.o. female with a PMHx of  Lupus, ESRD, Superior Vena Cava Syndrome, and HTN  admitted for abdominal pain. Patient states she has been having nausea for the last couple of days. States that she is not experiencing vomiting or diarrhea. Nausea is made worse by eating, was able to tolerate juice at bedside. Complains of supra pubic pain that wraps around to the back. Endorses some increased urinary frequency and occasional dysuria. States that she has multiple ED visits due to her chronic conditions. Last dialysis she states was "Monday". States she is visiting a sick cousin for the holidays but primary residence is in [place]. Denies chest pain, SOB, active bleeding.     PMHx:   Past Medical History:   Diagnosis Date    ESRD (end stage renal disease) on dialysis (CMS-HCC)      Home medications:   No current facility-administered medications on file prior to encounter.     Current Outpatient Medications on File Prior to Encounter   Medication Sig Dispense Refill    AmLODIPine (NORVASC) 10 mg tablet Take 1 tablet by mouth.      apixaban (ELIQUIS) 5 MG tablet Take 5 mg by mouth.         FMHx:   family history is not on file.    SurgHx:  History reviewed. No pertinent surgical history.    SoHx:   Tobacco:  reports that she has never smoked. She has never used smokeless tobacco.   Alcohol:  reports no history of alcohol use.  Illicit Drugs:  reports no history of drug use.    Allergies:   Allergies   Allergen Reactions    Ace Inhibitors     Acetaminophen     Codeine     Fentanyl     Hydrocodone     Oxycodone     Sulfa Antibiotics      Review of Systems:  Review of Systems  GEN:   Denies fevers,   HEENT:  Denies changes in vision   CV:    Denies chest pain, orthopnea, and palpitations  PULM:  Denies cough and shortness of breath  GI:   Endorses nausea denies vomiting  GU:   Endorses dysuria and frequency  MSK:   Denies swelling and weakness  NEURO:  Denies numbness, tingling and focal weakness  SKIN:    Denies rash and skin breakdown  PSYCH:  Denies any depressive or manic symptoms    Objective     Physical Exam:   Weight: 77.5 kg (170 lb 13.7 oz)    Temp:  [35.7 C (96.3 F)-36.9 C (98.4 F)] 35.7 C (96.3 F)  Heart Rate:  [74-84] 75  Respirations:  [16-18] 18  Blood Pressure: (150-159)/(86-100) 150/97    Vitals:    03/12/22 1928   Weight: 77.5 kg (170 lb 13.7 oz)     No intake or output data in the 24 hours ending 03/13/22 1149    General appearance: Pt is not  in NAD. The patient is conversant.   Eyes: Anicteric sclerae, moist conjunctivae; no lid-lag  HENT: Atraumatic  Neck: Trachea midline. FROM, supple, tortuous veins noted  Lungs: Trachea in midline.  Equal chest expansions bilaterally. Clear breath sounds heard bilaterally.  No wheezes or rales appreciated. No peripheral cyanosis or clubbing.   CV: Regular rate and rhythm. Normal S1/S2. No S3/S4. No murmurs, rubs, or gallops.   Abdomen: Non-distended. No visible pulsations. Normoactive bowel sounds. No voluntary or involuntary guarding; or rebound tenderness; no obvious organomegaly. Mild tenderness noted   Musculoskeletal: Normal bulk and tone, left UE fistula noted, RUE healed fistula noted   Skin: Normal temperature, turgor and texture  Neurological:  CN 2-12 are grossly intact without any deficits.   Psych: Appropriate affect, alert and oriented to person, place and time      Labs/Studies:   Recent Labs   Lab 03/13/22  0126   WBC 2.2*   HGB 10.4*   HCT 32.4*   PLT 132*   MCH 30.9   MCHC 32.1   MCV 96.0     Recent Labs   Lab 03/13/22  0126   NA 139   K 4.2   CL 97*   CO2 25   BUN 28*   CREATININE 8.3*   GLU 82   CALCIUM 8.7*   CALIUMALBADJ 8.7*   LABALB 4.3   PROT 7.0   BILITOT 0.3    BILIDIR <0.1   ALKPHOS 104   ALT 20   AST 24     No results for input(s): "PT", "APTT", "INR" in the last 168 hours.  No results for input(s): "CKMB", "TROPONINI" in the last 72 hours.    Invalid input(s): "CPKTOTAL"  No results for input(s): "PHART", "PCO2ART", "PO2ART" in the last 72 hours.    Imaging:   Radiology Results (last 1 day)       Procedure Component Value Units Date/Time    CT Abdomen and Pelvis With IV Contrast Only [id number] Collected: 03/13/22 0912    Order Status: Completed Updated: 03/13/22 0918    Narrative:      PRELIMINARY FINDINGS AND COMMUNICATION:    EXAM:  CT ABDOMEN AND PELVIS WITH IV CONTRAST ONLY  MRN: [id number]  INDICATION:  Abdominal pain, acute, nonlocalized  ADDITIONAL HISTORY: Chart review reveals history of lupus, on dialysis. Presents status post abdominal pain after missed dialysis.  COMPARISON: None.    FINDINGS/IMPRESSION:   *    Vicarious excretion of contrast within the gallbladder.  *    Numerous bilateral renal cystic lesions replacing most of the renal parenchyma. No hydroureteronephrosis. No obstructing renal calculi.  *    Left adnexal cyst measuring up to 3.0 cm.  *    Splenic cystic lesion. Mild gastric wall edema. Diffuse engorged subcutaneous vasculature.  *    No bowel obstruction or distention.    Preliminary findings were published to the electronic medical record by Tish Frederickson, MD.    COMMUNICATION:  None.    Prelim reconciliation: N/A         Assessment and Plan     Katherine [name] is a 26 y.o. female with a PMHx of Lupus, ESRD, Superior Vena Cava Syndrome, and HTN  admitted for abdominal pain.     #Abdominal Pain   Assessment:  - Lipase: 30, Liver Enzymes unremarkable   - Complaints of persistent nausea and abdominal pain   - CT AB Prelim Reading:    *    Vicarious excretion of contrast within the gallbladder.  *    Numerous bilateral renal cystic lesions replacing most of the renal parenchyma. No hydroureteronephrosis. No obstructing renal calculi.  *     Left  adnexal cyst measuring up to 3.0 cm.  *    Splenic cystic lesion. Mild gastric wall edema. Diffuse engorged subcutaneous vasculature.  *    No bowel obstruction or distention.  - Patient has had several ED visits and admission   Plan:  - Pain management 65m morphine Q4 prn   - Zofran 428mIV Q8prn fo nausea   - Advance diet as tolerated   - Follow up final read CTAB   - Continue to monitor     #ESRD   Assessment:  - On HD MWF endorses missing some sessions but [place]  Plan:  - Consult to Nephrology for HD scheduling     #SLE   Assessment:  - Does not appear to be on any specific management  Plan:  - Can consider Rheum consult if patient fails to improve     #Hypertension   Assessment:  - Per patient is only taking Amlodipine 1051m - BP 150s/90s may be 2/2 to c/o of pain and discomfort  Plan:  - Continue home med  - Continue to monitor     #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   Assessment:  - Home Med: Apixiban 5mg34mD   Plan:  - Continue home medication     #Concern for UTI   Assessment:  - States that she has had increased frequency with occasional dysuria   - UA is LE positive   Plan:  - Start Ceftriaxone     -----------------------------------------------------------------------------------------------  Activity   :  As tolerated   Diet   :  Regular   Prophylaxis  : On Home Anticoagulation   Consults  : Nephrology  Code status  : Full   Disposition  : Pending clinical advancement   Discharge planning : Pending    CarmAmparo Bristol  Family Medicine, PGY-3  [facility]  Phone: [phone] call Cross Cover Mon-Fri after 3pm, and Sat-Sun after 12pm  [place] Team A, CDeliah Goodym CrosPort Townsender: [phone] Cross cover: [phone] Nighttime Cross cover: [phone]-[phone])         Electronically signed by KhanHildred Laser at 03/16/2022 10:32 AM EST    Associated attestation - KhanHildred Laser - 03/16/2022 10:32 AM EST  Formatting of this note might be different from the original.  Patient seen and examined.  The resident team and I discussed the patient and agree with assessment plan.

## (undated) NOTE — Unmapped External Note (Signed)
Formatting of this note might be different from the original.    Problem: Knowledge Deficit  Goal: Patient will Participate in learning opportunities offered by staff  Outcome: Continue with Current Goal    Problem: Infection/Isolation (Risk/Actual)  Goal: Patient will be free of hospital acquired infection throughout hospitalization  Outcome: Continue with Current Goal    Problem: Pain/Comfort  Goal: Adequate pain control while hospitalized  Outcome: Continue with Current Goal    Problem: Fall Risk  Goal: Patient will remain as independent as possible  Outcome: Continue with Current Goal    Electronically signed by Benito Mccreedy, RN at 03/14/2022  7:49 AM EST

## (undated) NOTE — Consults (Signed)
Formatting of this note is different from the original.  Reason for Consult:  ESRD on Dialysis    History Of Present Illness:  Katherine [name] is a 60 y.o. 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 She was also seen at [facility] in [place] on 9/30 and diagnosed with pyelonephritis based on positive urinalysis and flank pain. She was discharged with a 10 day course of 500 mg ciprofloxacin to treat. She denies any dysuria, but does not urinary frequency over the past few weeks. On admission she was found to have potassium of 6.4 and a creatinine of 15.14, baseline ~8 and was given calcium gluconate, insulin with glucose and lokelma. Nephrology was consulted for dialysis.    Past Medical History:  She has a past medical history of ESRD (end stage renal disease) (CMS/HCC) and Lupus (CMS/HCC) (HCC) (CMS/HCC).    Surgical History:  She has no past surgical history on file.    Social History:  She has no history on file for tobacco use, alcohol use, and drug use.    Allergies:  Ace inhibitors, Acetaminophen, Codeine, Fentanyl, Hydrocodone, Oxycodone, Sulfa (sulfonamide antibiotics), Nsaids (non-steroidal anti-inflammatory drug), and Zofran [ondansetron hcl]    Medications:  Medications Prior to Admission   Medication Sig Dispense Refill Last Dose    amLODIPine (NORVASC) 10 mg tablet Take ONE tablet by mouth 1 (one) time each day.       apixaban (ELIQUIS) 5 mg tablet Take ONE tablet by mouth every 12 (twelve) hours.       calcitrioL (ROCALTROL) 0.25 mcg capsule Take ONE capsule by mouth 3 (three) times a week.       losartan (COZAAR) 50 mg tablet Take ONE tablet by mouth 1 (one) time each day.       metoprolol  succinate (TOPROL-XL) 25 mg 24 hr tablet Take ONE tablet by mouth 1 (one) time each day.        Review of Systems   Constitutional:  Positive for appetite change (decreased) and fatigue. Negative for fever and unexpected weight change.   Respiratory:  Negative for chest tightness and shortness of breath.    Cardiovascular:  Negative for chest pain, palpitations and leg swelling.   Gastrointestinal:  Positive for nausea and vomiting. Negative for abdominal distention, abdominal pain, constipation and diarrhea.   Genitourinary:  Positive for frequency. Negative for difficulty urinating, dysuria, flank pain, hematuria and urgency.   Musculoskeletal:  Negative for back pain.   Skin: Negative.    Neurological:  Positive for light-headedness. Negative for dizziness and weakness.   :    Physical Exam  Vitals reviewed.   Constitutional:       General: She is not in acute distress.     Appearance: Normal appearance. She is ill-appearing. She is not toxic-appearing.   HENT:      Head: Normocephalic and atraumatic.      Mouth/Throat:      Mouth: Mucous membranes are moist.      Pharynx: Oropharynx is clear. No oropharyngeal exudate or posterior oropharyngeal erythema.   Eyes:      Extraocular Movements: Extraocular movements intact.      Pupils: Pupils are equal, round, and reactive  to light.   Cardiovascular:      Rate and Rhythm: Normal rate and regular rhythm.      Pulses: Normal pulses.      Heart sounds: Normal heart sounds. No murmur heard.     No friction rub. No gallop.   Pulmonary:      Effort: Pulmonary effort is normal. No respiratory distress.      Breath sounds: No wheezing, rhonchi or rales.   Abdominal:      General: Bowel sounds are normal. There is no distension.      Palpations: Abdomen is soft. There is no mass.      Tenderness: There is no abdominal tenderness. There is no right CVA tenderness or left CVA tenderness.      Hernia: No hernia is present.   Musculoskeletal:         General: No tenderness.  Normal range of motion.      Cervical back: Normal range of motion and neck supple.      Right lower leg: Edema (1+ pitting edema up to mid calf) present.      Left lower leg: Edema (1+ pitting edema up to mid calf) present.   Skin:     General: Skin is warm and dry.   Neurological:      General: No focal deficit present.      Mental Status: She is alert and oriented to person, place, and time.   Psychiatric:         Mood and Affect: Mood normal.         Behavior: Behavior normal.     :    Last Recorded Vitals:   Blood pressure (!) 182/111, pulse 88, temperature 36.4 C (97.6 F), temperature source Infrared Forehead, resp. rate 15, height 1.676 m (5' 6"$ ), SpO2 100 %.    Relevant Results:  Recent Results (from the past 24 hour(s))   ECG 12 lead    Collection Time: 12/23/21  8:51 PM   Result Value Ref Range    Heart Rate 78 bpm    RR Interval 769 ms    Atrial Rate 77 ms    P-R Interval 218 ms    P Duration 136 ms    P Horizontal Axis 10 deg    P Front Axis 46 deg    Q Onset 510 ms    QRSD Interval 97 ms    QT Interval 405 ms    QTcB 462 ms    QTcF 442 ms    QRS Horizontal Axis -36 deg    QRS Axis 34 deg    I-40 Horizontal Axis 5 deg    I-40 Front Axis 29 deg    T-40 Horizontal Axis -78 deg    T-40 Front Axis -4 deg    T Horizontal Axis 42 deg    T Wave Axis 60 deg    S-T Horizontal Axis 74 deg    S-T Front Axis 76 deg   CBC    Collection Time: 12/23/21  9:53 PM   Result Value Ref Range    WBC  4.9 4.5 - 12.5 x10*3/uL    RBC 3.51 (L) 4.20 - 5.40 x10E6/uL    Hemoglobin 11.0 (L) 12.0 - 16.0 g/dL    Hematocrit 33.2 (L) 36.0 - 48.0 %    MCV 94.6 81.0 - 99.0 fL    MCH 31.3 (H) 27.0 - 31.0 pg    MCHC 33.1 31.0 - 36.0 g/dL    RDWSD 60.1 (  H) 36.4 - 46.3 fL    Platelets 140 (L) 150 - 450 x10*3/uL    MPV 10.3 7.4 - 10.4 fL    RDWCV 17.7 (H) 11.7 - 14.4 %    nRBC 0.0 %    nRBC Absolute 0.00 0.00 - 0.01 x10*3/uL   Comprehensive Metabolic Panel    Collection Time: 12/23/21  9:53 PM   Result Value Ref Range    Sodium 137 136 - 145  mmol/L    Potassium 6.4 (HH) 3.4 - 4.9 mmol/L    Chloride 96 (L) 98 - 107 mmol/L    CO2 16 (L) 21 - 32 mmol/L    BUN 92 (H) 7 - 25 mg/dL    Creatinine 15.14 (HH) 0.60 - 1.30 mg/dL    Glucose, Random 63 (L) 74 - 109 mg/dL    Calcium 8.3 (L) 8.6 - 10.2 mg/dL    AST 16 13 - 39 U/L    ALT  7 7 - 52 U/L    Alkaline Phosphatase 84 30 - 105 U/L    Protein Total 8.1 6.4 - 8.9 g/dL    Albumin 4.4 3.5 - 5.7 g/dL    Bilirubin Total 0.4 0.3 - 1.0 mg/dL    eGFR 2.9 (L) >60.0 mL/min/1.41m2    Anion Gap 25 (H) 1 - 11 mmol/L   Lipase    Collection Time: 12/23/21  9:53 PM   Result Value Ref Range    Lipase 22 11 - 82 U/L   Magnesium    Collection Time: 12/23/21  9:53 PM   Result Value Ref Range    Magnesium 2.7 1.9 - 2.7 mg/dL   POCT glucose meter docked device    Collection Time: 12/24/21  2:52 AM   Result Value Ref Range    Glucose bgl- 35 mg/dl    POCT glucose meter    Collection Time: 12/24/21  2:52 AM   Result Value Ref Range    Glucose 35 (LL) 74 - 106 mg/dL   POCT glucose meter    Collection Time: 12/24/21  3:21 AM   Result Value Ref Range    Glucose 49 (LL) 74 - 106 mg/dL   POCT glucose meter    Collection Time: 12/24/21  3:40 AM   Result Value Ref Range    Glucose 60 (L) 74 - 106 mg/dL   POCT glucose meter    Collection Time: 12/24/21  4:56 AM   Result Value Ref Range    Glucose 106 74 - 106 mg/dL   POCT glucose meter docked device    Collection Time: 12/24/21  4:57 AM   Result Value Ref Range    Glucose bgl-106 mg/dl      Assessment/Plan:    #ESRD HD MWF  #Hyperkalemia  Anemia in CKD  Dependence on dialysis   Patient was diagnosed with ESRD in 2016 2/2 lupus nephritis and is currently on dialysis MWF. She missed the past week of dialysis treatments as she was out of town.   -Nausea, vomiting, fatigue likely secondary to uremia in the setting of missed dialysis.  -Hyperkalemia 2/2 missed dialysis, anticipate this will improve with HD.  -Can give calcium gluconate and lokelma if potassium does not improve with  dialysis.  -Hemodialysis today, return to normal schedule at [place].  -Patient cleared for  after dialysis from a nephrology perspective.    #NAGMA  Normal anion gap with CO2 of 16 on admission.  -Likely pure NAGMA in the setting of hyperkalemia, will improve with  improvement in serum potassium.    I have seen and evaluated this patient and discussed the plan of care with my attending Dr. Selinda Orion. Please see attestation for definitive recommendations.    Charlann Boxer Earleen Newport DO  Internal Medicine, PGY2  Electronically signed by Oneal Deputy, MD at 12/24/2021 11:56 AM EDT

## (undated) NOTE — ED Provider Notes (Signed)
Formatting of this note is different from the original.  Images from the original note were not included.    [facility] EMERGENCY DEPARTMENT AT [facility]  Provider Note    CSN: [id number]  Arrival date & time: 07/01/22  1219        History    Chief Complaint   Patient presents with    Emesis    Nausea    Flank Pain     Katherine [name] is a 5 y.o. female with past medical history significant for ESRD on dialysis, lupus, lupus nephritis, anemia of chronic disease, asthma, hypertension presents to the ED complaining of nausea, vomiting, and left-sided flank pain since Wednesday afternoon.  Patient was seen for same symptoms yesterday at [facility] ED, but did not stay for treatment.  She reports some shortness of breath and leg swelling today.  Patient attends dialysis T/Th/Sat with her last session on Tuesday.  Patient missed yesterday and this past Saturday.  Patient had sudden onset of left flank pain on Wednesday and states it reminds her of her prior renal hematoma.  Denies fever, chills, diarrhea, chest pain, abdominal pain, weakness, syncope, palpitations.          Home Medications  Prior to Admission medications    Medication Sig Start Date End Date Taking? Authorizing Provider   amLODipine (NORVASC) 10 MG tablet Take 10 mg by mouth daily. 04/12/17   [provider]   calcium acetate (PHOSLO) 667 MG capsule Take 667 mg by mouth 3 (three) times daily with meals.    [provider]   HYDROmorphone (DILAUDID) 2 MG tablet Take 0.5 tablets (1 mg total) by mouth every 4 (four) hours as needed for severe pain. 09/23/17   Jacalyn Lefevre, MD   ondansetron (ZOFRAN-ODT) 4 MG disintegrating tablet Take 1 tablet (4 mg total) by mouth every 8 (eight) hours as needed for up to 15 doses for nausea or vomiting. 11/30/21   Terald Sleeper, MD   promethazine (PHENERGAN) 25 MG tablet Take 1 tablet (25 mg total) by mouth every 6 (six) hours as needed for nausea or vomiting. 09/23/17   Jacalyn Lefevre, MD        Allergies     Acetaminophen, Codeine, Enalapril, Fentanyl, Hydrocodone, Oxycodone, Sulfa antibiotics, and Nsaids      Review of Systems    Review of Systems   Constitutional:  Negative for chills and fever.   Respiratory:  Positive for shortness of breath.    Cardiovascular:  Positive for leg swelling. Negative for chest pain and palpitations.   Gastrointestinal:  Positive for nausea and vomiting. Negative for abdominal pain and diarrhea.   Genitourinary:  Positive for flank pain.   Neurological:  Negative for syncope and weakness.     Physical Exam  Updated Vital Signs  BP 137/84 (BP Location: Left Arm)   Pulse 84   Temp 97.8 F (36.6 C) (Oral)   Resp 16   Ht 5\' 6"  (1.676 m)   SpO2 99%   BMI 27.58 kg/m   Physical Exam  Vitals and nursing note reviewed.   Constitutional:       General: She is not in acute distress.     Appearance: Normal appearance. She is not ill-appearing or diaphoretic.   Cardiovascular:      Rate and Rhythm: Normal rate and regular rhythm.      Heart sounds: Normal heart sounds.      Arteriovenous access: Right arteriovenous access is present.  Pulmonary:  Effort: Pulmonary effort is normal.      Breath sounds: Normal breath sounds and air entry.   Abdominal:      General: Abdomen is flat.      Palpations: Abdomen is soft.      Tenderness: There is no abdominal tenderness. There is left CVA tenderness.      Comments: Left CVA tenderness with light palpation.  Abdomen is soft and non-tender.    Musculoskeletal:      Right lower leg: 2+ Edema present.      Left lower leg: 2+ Edema present.   Skin:     General: Skin is warm and dry.      Capillary Refill: Capillary refill takes less than 2 seconds.   Neurological:      Mental Status: She is alert and oriented to person, place, and time. Mental status is at baseline.   Psychiatric:         Mood and Affect: Mood normal.         Behavior: Behavior normal.     ED Results / Procedures / Treatments    Labs  (all labs ordered are listed,  but only abnormal results are displayed)  Labs Reviewed   COMPREHENSIVE METABOLIC PANEL - Abnormal; Notable for the following components:       Result Value    Sodium 129 (*)     Potassium 6.8 (*)     Chloride 94 (*)     CO2 21 (*)     BUN 75 (*)     Creatinine, Ser 11.87 (*)     Calcium 7.4 (*)     AST 14 (*)     GFR, Estimated 4 (*)     All other components within normal limits   CBC - Abnormal; Notable for the following components:    WBC 3.1 (*)     RBC 2.69 (*)     Hemoglobin 8.0 (*)     HCT 24.4 (*)     RDW 15.9 (*)     All other components within normal limits   LIPASE, BLOOD   HEPATITIS B SURFACE ANTIGEN   HEPATITIS B SURFACE ANTIBODY, QUANTITATIVE   I-STAT BETA HCG BLOOD, ED ([facility], [facility], [facility] ONLY)     EKG  EKG Interpretation    Date/Time:  Friday July 01 2022 13:55:16 EDT  Ventricular Rate:  77  PR Interval:  218  QRS Duration: 108  QT Interval:  413  QTC Calculation: 468  R Axis:   78  Text Interpretation: Sinus rhythm Prolonged PR interval similar to prior, no stemi Confirmed by Tanda Rockers ([id number]) on 07/01/2022 3:18:26 PM    Radiology  DG Chest Portable 1 View    Result Date: 07/01/2022  CLINICAL DATA:  Short of breath.  Nausea, vomiting and flank pain. EXAM: PORTABLE CHEST 1 VIEW COMPARISON:  09/23/2017. FINDINGS: Cardiac silhouette normal in size. No mediastinal or hilar masses. Right subclavian vascular stent, new since the prior exam. Clear lungs.  No pleural effusion or pneumothorax. Surgical vascular clips along the right lateral breast. Medial right upper extremity vascular stent. IMPRESSION: No acute cardiopulmonary disease. Electronically Signed   By: Amie Portland M.D.   On: 07/01/2022 14:51      Procedures  Procedures     Medications Ordered in ED  Medications   HYDROmorphone (DILAUDID) injection 1 mg (1 mg Intravenous Given 07/01/22 1501)   sodium zirconium cyclosilicate (LOKELMA) packet 10 g (10 g Oral Given 07/01/22 1532)  insulin aspart (novoLOG) injection 5 Units (5 Units Intravenous Given  07/01/22 1732)     And   dextrose 50 % solution 50 mL (50 mLs Intravenous Given 07/01/22 1729)   albuterol (PROVENTIL) (2.5 MG/3ML) 0.083% nebulizer solution 10 mg (10 mg Nebulization Given 07/01/22 1732)     ED Course/ Medical Decision Making/ A&P        Medical Decision Making  Amount and/or Complexity of Data Reviewed  Labs: ordered.  Radiology: ordered.    Risk  OTC drugs.  Prescription drug management.    This patient presents to the ED with chief complaint(s) of left flank pain with pertinent past medical history of ESRD on dialysis, lupus, lupus nephritis.  The complaint involves an extensive differential diagnosis and also carries with it a high risk of complications and morbidity.      The differential diagnosis includes symptoms secondary to missed dialysis, renal hemorrhage, musculoskeletal strain; low suspicion for pyelonephritis, obstructive uropathy, or renal infarction due to patient having CT performed yesterday without acute findings    The initial plan is to obtain baseline labs, provide analgesia and antiemetics     Additional history obtained:  Records reviewed  patient has been seen at [facility] for similar symptoms and has a known hematoma of the left kidney.  CT imaging was performed yesterday at [facility] ED with evidence of interval improvement in the hematoma and no active extravasation.  Plan was to have patient emergently dialyzed due to missing dialysis.  She received IV calcium, Lasix, and Lokelma.  ECG yesterday with peaked T waves.  Patient was to be admitted for further workup and dialysis, however, left AMA.     Initial Assessment:    Exam significant for tenderness to palpation of the L CVA.  No abdominal tenderness or obvious distension.  She does have 2+ non pitting edema in bilateral lower extremities.  She is able to ambulate without difficulty.  Heart rate is normal in the 80s with regular rhythm.  Lungs clear to auscultation with adequate tidal volume.  Vitals are stable.      Independent ECG/labs interpretation:   The following labs were independently interpreted:   ECG with prolonged PR interval, but no peaked T waves, ischemia, or infarction.    CBC with leukopenia and anemia.  Patient with known anemia of chronic disease.   Metabolic panel with hyperkalemia, appears worse than yesterday, and worsening creatinine.  She also has continued hypocalcemia and hyponatremia.  Hepatic enzymes are not elevated.   Lipase is normal.     Independent visualization and interpretation of imaging:  I independently visualized the following imaging with scope of interpretation limited to determining acute life threatening conditions related to emergency care: chest x-ray, which revealed no evidence of pleural effusion, pulmonary edema, or infiltrate.  Heart is of normal size.  I agree with radiologist interpretation.     I do not believe that further imaging is required at this time.  Patient had CT performed yesterday at [facility] ED without new acute intraabdominal findings.  Patient had interval improvement in known renal hematoma.       Consultations Obtained:  I requested consultation with on-call nephrologist and discussed patient case with Dr. Marisue Humble who recommends patient to be transferred to [facility] for emergent dialysis.    Treatment and Reassessment:  IV dilaudid and phenergan given to patient with improvement in symptoms.  Patient was given [facility] for hyperkalemia.  Per Dr. Lily Lovings request, she was also given albuterol  and insulin/dextrose due to probable increased wait time until patient able to be dialyzed tonight.      Disposition:    Patient to be transferred to [facility] for emergent dialysis.  Patient will go via [facility].  Dr. Fredderick Phenix assumed care of the patient.      Final Clinical Impression(s) / ED Diagnoses  Final diagnoses:   Left flank pain   Nausea and vomiting, unspecified vomiting type   Non-compliance with renal dialysis     Rx / [place] Orders  ED Discharge Orders        None           Lenard Simmer, PA-C  07/02/22 1222      Sloan Leiter, DO  07/07/22 1538    Electronically signed by Sloan Leiter, DO at 07/07/2022  3:38 PM EDT    Associated attestation - Sloan Leiter, DO - 07/07/2022  3:38 PM EDT  Formatting of this note might be different from the original.  Attestation: Medical screening examination/treatment/procedure(s) were performed by non-physician practitioner and as supervising physician I was immediately available for consultation/collaboration.    EKG Interpretation    Date/Time:  Friday July 01 2022 13:55:16 EDT  Ventricular Rate:  77  PR Interval:  218  QRS Duration: 108  QT Interval:  413  QTC Calculation: 468  R Axis:   78  Text Interpretation: Sinus rhythm Prolonged PR interval similar to prior, no stemi Confirmed by Tanda Rockers ([id number]) on 07/01/2022 3:18:26 PM

## (undated) NOTE — ED Notes (Signed)
Formatting of this note might be different from the original.  Pt refusing vitals at this time.   Electronically signed by Marcella Dubs, NT at 07/01/2022  4:32 PM EDT

## (undated) NOTE — Discharge Summary (Signed)
Formatting of this note might be different from the original.  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  Patient Condition: Stable.    Hospital Course:  A 58 years old African-American female with obesity, ESRD on hemodialysis via right arm AV fistula, SVC syndrome, hypertension who presented with lightheadedness, nausea after missing dialysis.  She was found to have severe hypertension and hyperkalemia.  These were due to missing dialysis.  Nephrology took the patient for dialysis with improvement of her symptoms.  Subsequently she was discharged home in stable condition without oxygen supplement.  Dialysis compliance encouraged.  Her dialysis center is in [place], [place].  Of note; patient had multiple recent ED visits.  In September 2023, it was 11 times for various complaints.  Since October, 3 times already.    #ESRD  Dialysis per nephrology  Encouraged compliant  Continued calcitriol, phoslo    #Hypertension  Resumed home losartan, metoprolol, amlodipine    Pertinent Physical Exam At Time of Discharge:  Physical Exam  General Appearance: Obese lady, Not in Acute Distress.   Head/Ear/Nose/Throat: Normocephalic; Atraumatic.  On room air.  Respiratory: CTA B/L. No wheezing.  Cardiovascular: RRR; Normal S1, S2.   Gastrointestinal: Soft; Not tender; +BS.   Extremities: No Cyanosis, Clubbing or Edema.    Musculoskeletal: No Injury or Deformity.  Right arm AV fistula with good thrill.  Neurological: Awake alert.  Behavior/Emotional: Appropriate; Cooperative.     Outpatient Follow-Up:  PCP 1 week.    35 minutes used coordinating this discharge    Electronically signed by Hansel Feinstein, MD at 12/24/2021  3:27 PM EDT

## (undated) NOTE — Unmapped External Note (Signed)
Formatting of this note might be different from the original.  6:40 PM EST    Patient complains of vomiting with hematemesis and hematochezia. Patient is a dialysis patient and has fistula in right arm.  No acute distress, ambulatory, and A&OX4        I have seen the patient and initiated a medical screening exam.  Patient will require further evaluation to ensure that an emergency medical condition does not exist or that one has been stabilized.  Jodelle Gross, PA-C     Jodelle Gross, PA-C    Electronically signed by Jodelle Gross, PA-C at 03/12/2022  6:42 PM EST

## (undated) NOTE — Unmapped External Note (Signed)
Formatting of this note might be different from the original.    PLAN OF CARE SUMMARY NOTE    NAME: Katherine [name] MRN: [id number] DOB: 1985/09/16    Admit date: 03/12/2022    Medical reason(s) for inpatient admission (i.e., service(s)/ treatment(s) mandating inpatient care) :   Abdominal pain  N/V        Anticipated Discharge Date: Today    Estimated length of stay for DRG(s):  4 days    Actual Length of Stay: 4 days    Discharge Diagnoses:    Abdominal pain  N/V  UTI      Medications anticipated after discharge are listed in Discharge Medication Reconciliation.       Anticipated discharge to: home        Discharge instructions include:    1. Continue your medications as discussed.  2. Follow up with your appointment dates by calling Central Scheduling at               [phone] to schedule the appointment.   3. Please see your PCP within 1-2 weeks of discharge.  4. The Advice Nurse is available 24 hours a day, seven days a week to help patients access the healthcare services they need. The center is staffed by specially trained registered nurses. Doctors are also on call around the clock to support the advice nurses. Call [phone] if you have any questions/concerns    Important numbers and information if needed:  - Blood Work:  WALK-IN to the main outpatient lab 1st floor, D wing.    From 7am-5pm Monday through Friday.  - X-RAY: WALK-IN 3rd floor radiology, D wing.     From 7am-3pm Monday through Friday.   - PFTs (pulmonary function test):  2nd floor, D wing, Room 0051.    Call Central Scheduling to make appointment:  [phone]  - CT Scan: 10th floor, B wing.    Call Central Scheduling to make appointment: [phone]  - ECHO (heart ultrasound):  2nd floor, F wing.    Call to make appointment:  [phone]  - [facility] (Main Outpatient), please call [phone] or [phone] for refill .   - Social Work questions, please call [phone].     General Instructions:   Pt instructed to show up to all  appointments.  Pt instructed on importance of medical compliance/adherence to medications and follow up appointments.  Pt instructed on proper dietary and lifestyle modifications (ie sodium restriction, cessation of tobacco, drug, ETOH or unsafe sexual practices as it applies) to enhance adequate health maintenance.  Pt advised to notify PCP or present to [facility] if symptoms recur or worsen.    Future Appointments:  Follow-up with PCP, Nephrology      Also, the following future appointments already exist:  No future appointments.    Electronically signed by  Marko Stai, MD         03/17/2022  9:09 AM EST    Electronically signed by Hildred Laser, MD at 03/17/2022 11:13 AM EST    Associated attestation - Hildred Laser, MD - 03/17/2022 11:13 AM EST  Formatting of this note might be different from the original.  Agree.

## (undated) NOTE — ED Notes (Signed)
Formatting of this note is different from the original.  First contact with pt.  Chief Complaint   Patient presents with    Abdominal Pain     Pt endorses chest pain/flank pain/abd pain 9/10 for the past 2 days. Pt also endorses blood in vomit and bowel movements.       PMH ESRD w/HD M/W/F, lupus ovarian cysts. States she missed her dialysis on Wednesday and Friday. Denies HA, urinary symptoms. Resp even and unlabored on RA. Care ongoing.   Electronically signed by Pricilla Larsson, RN at 03/13/2022 12:46 AM EST

## (undated) NOTE — ED Provider Notes (Signed)
Formatting of this note might be different from the original.  Patient has been waiting for dialysis.  I was notified by nursing staff that patient left yelling down the hall that she did not want to wait any longer and felt that this was a racist hospital.  I did not have the opportunity to discuss this further with the patient as she was already gone.    Rolan Bucco, MD  07/01/22 2337    Electronically signed by Rolan Bucco, MD at 07/01/2022 11:37 PM EDT

## (undated) NOTE — Progress Notes (Signed)
Formatting of this note might be different from the original.  Care Management Final Discharge Note    Discharge Disposition:    Discharge: 01 - Discharge to Home or Self Care (Routine Discharge)   Hospice:     Readmission:       1. Disposition Location: Home/Recuperative Care/Self    2. Address of Discharge Location: [place], [place] [address]    3. Nurse-to-Nurse Report Number (if applicable): N/A    4. Family Notified of Discharge Location: yes, by pt    Contact Information:     5. Transportation (please call Dispatch at [phone] and follow prompts): Self-pt has a bus ticket to get back to [place]  Time scheduled:     6. Post-Discharge Services Arranged:    Home Health? No  Home Health Provider: N/A  Clinic Appointments? Yes  TOC Clinic? Yes  DME? No  Home IV Abx? No  Equipment: N/A  DME Provider: N/A  Infusion Company: N/A    7. Choice Form Completed? N/A    8. IM Medicare Letter Completed? N/A    9. TOC Completed? Yes    10. Comments: Patient is independent and is returning to [place] by bus. Patient did not request any further assistance.     Bertram Denver, [place]    Electronically signed by Bertram Denver, MSW at 03/17/2022 10:07 AM EST

## (undated) NOTE — Consults (Signed)
Formatting of this note is different from the original.    [facility] Nephrology Consult Notes     Patient ID: Katherine [name]  MRN: [id number]  Admission Date: 03/12/2022  Attending [place], [place]: [facility] - [facility] [place]  Name of Nephrologist: Dr. Lacretia Leigh  Access: RUE AVF ~7 years ago  How long has patient been on dialysis: 2014    Reason for consult: ESRD(MWF)    Subjective     CC: Abdominal/flank/chest pain    Katherine [name] is a 29 y.o. 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 Pt states these symptoms started 2 days ago, but denies any fevers, SOB. Endorse pleuritic chest pain. Patient was last dialyzed on Monday prior to arriving to [facility] for an emergency family case. She does not take any medications associated for her lupus since starting HD ~10 years ago. Pt also c/o of dysuria and increase in urinary frequency starting 2 days ago.    Upon admission to ED, patient lab significant for BUN 28, Cr 8.3, AG17, with a UA positive for >50 WBC and 3+LE. She received CTAP with IV contrast significant for bilateral renal cystic lesions, likely consistent with ESRD. Pt started on CTX 1g. [facility] was consulted for assistance in management of ESRD(MWF).     Past Medical History  Past Medical History:   Diagnosis Date    ESRD (end stage renal disease) on dialysis (CMS-HCC)      Past Surgical History  History reviewed. No pertinent surgical history.  Right UE AVF placed ~7 years ago    Home Medications  No current facility-administered medications on file prior to encounter.     Current Outpatient Medications on File Prior to Encounter   Medication Sig Dispense Refill    AmLODIPine (NORVASC) 10 mg tablet Take 1 tablet by mouth.      apixaban (ELIQUIS) 5 MG tablet Take 5 mg by mouth.       Allergies  Allergies   Allergen  Reactions    Ace Inhibitors     Acetaminophen     Codeine     Fentanyl     Hydrocodone     Oxycodone     Sulfa Antibiotics      Social History  Social History     Tobacco Use    Smoking status: Never    Smokeless tobacco: Never   Substance Use Topics    Alcohol use: Never    Drug use: Never     Family History  No family history on file.  None    Review of Systems  A 10 point ROS was completed and was negative except per HPI    Objective      Vital Signs  Vital Signs    Temp:  [35.7 C (96.3 F)-36.9 C (98.4 F)] 35.7 C (96.3 F) (12/24 1059)  Heart Rate:  [74-84] 75 (12/24 1059)  Respirations:  [16-18] 18 (12/24 1059)  Blood Pressure: (150-159)/(86-100) 150/97 (12/24 1059)     No intake or output data in the 24 hours ending 03/13/22 1244     No intake/output data recorded.  No intake/output data recorded.    Physical Exam  Constitutional: NAD, appropriately conversant   Eyes: Anicteric sclerae, EOMI bilaterally  HENMT: NC/AT, MMM  Cardiovascular: Harsh systolic murmur, normal S1 S2, no pericardial friction rub, no JVD  Respiratory: CTAB, no wheezing, rales, rhonchi  Gastrointestinal: mildly tender, NT, BS+  Genitourital: Suprapubic tenderness  Musculoskeletal: NROM, cyanosis, 1+ pitting edema, RUE AVF with +thrill/bruit, negative asterixis   Skin: Warm, dry  Neurologic: AOx4  Psychiatric: Appropriate mood and affect  Hem/Lymph/Imm: No bruising, bleeding or petechiae appreciable    Labs/Imaging     Lab Results   Component Value Date    WBC 2.2 (L) 03/13/2022    HGB 10.4 (L) 03/13/2022    HCT 32.4 (L) 03/13/2022    PLT 132 (L) 03/13/2022    NA 139 03/13/2022    K 4.2 03/13/2022    CL 97 (L) 03/13/2022    CO2 25 03/13/2022    BUN 28 (H) 03/13/2022    CREATININE 8.3 (H) 03/13/2022    GLU 82 03/13/2022    AST 24 03/13/2022    ALT 20 03/13/2022    BILITOT 0.3 03/13/2022    BILIDIR <0.1 03/13/2022    ALKPHOS 104 03/13/2022    PROT 7.0 03/13/2022    LABALB 4.3 03/13/2022     UA:  Lab Results   Component Value Date     COLORU Yellow 03/13/2022    CLARITYU Hazy 03/13/2022    SPECGRAV 1.009 03/13/2022    UAPH 8.0 03/13/2022    PROTEINU 2+=100 mg/dL (A) 03/13/2022    GLUCOSEU Negative < 50 mg/dL 03/13/2022    KETONESU Negative < 5 mg/dL 03/13/2022    BILIRUBINUR Negative < 2.0 mg/dL 03/13/2022    UROBILUA Negative <2.0 mg/dL 03/13/2022    HGBQUALU Negative <0.03 mg/dL 03/13/2022    NITRITE Negative 03/13/2022    LEUKESTRASEU 3+=500 WBC/uL (A) 03/13/2022    LEUKOCYTESUR > 50 / HPF (A) 03/13/2022    RBCU 3-10 / HPF (A) 03/13/2022    SQUAEPIUA 6 - 10 /HPF (A) 03/13/2022     Lab Results   Component Value Date    BACTERIAUA Present (A) 03/13/2022     No results found for: "RENALEPIUA", "HYALINECAST", "BROADCASTS", "GRANCASTUA", "CELLCASTUA", "WBCCASTSUR", "RBCCASTSUR", "EPICELLCAST", "WAXYCASTSUR", "FATTYCASTUA", "CASTSOTHERUA", "FINEGRANCA", "CRSEGRANCASU", "CACARBONCRY", "CALOXCRY"  No results found for: "PROTEIN24HR"    CTAP with IV contrast  IMPRESSION:   1.    No acute intra-abdominal or intrapelvic processes.  2.    Innumerable bilateral renal cystic lesions most of which are too small to characterize which could be secondary to polycystic renal disease. No hydronephrosis  3.    Diffuse engorged subcutaneous vessels involving the partially visualized thoracic subcutaneous soft tissues as well as the abdominal wall. This is of uncertain etiology.  4.    Additional chronic/incidental findings, as above.    Summary     Katherine [name] is a 29 y.o. 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 [facility] was consulted for assistance in management of ESRD(MWF) last dialyzed Monday.     Assessment and Recommendations     #ESRD(MWF)  #Anemia of Chronic Disease  #AGMA  - Pt last reported HD session on Monday. Lives in [place] and droves down to [place] for emergent family case.   - PE negative for pericardial friction rub, asterixis  - Clinically mildly volume overloaded  with 1+ PE  - Labs: BUN 28, Cr 8.3 with AG 17, Hgb 10.4  - Access RUE AVF  - Given patients current clinical picture, with normal electrolytes, and mildly elevated BUN and Cr. Will hold ooff on HD today.    #HTN  BP 150/90s  Home med: amlodipine 10 mg    #UTI  - Dysuria and increase in urinary frequency ~2 days   - +UA 3+ LE, and >50 WBC, started on CTX 1g    #SLE  - Dx over 10 years ago, without any medical management since starting HD ~7 years ago  - Denies any joint paint    Recommendations:  - Re-evaluate tomorrow for need of dialysis. Continue to monitor for now  - Restart home BP medications  - Collect Mg, PO4, PTH, Vit. D, Iron, TIBC, %Iron Sat, ferritin  - Collect HBV surface antigen  - Renally dose medications  - Avoid nephrotoxins   - Monitor UOP  - Monitor renal function     Thank you for having [place] participate in this patient's care! The assessment & recommendations reflect the input of my attending. Will continue to follow. Attending note to follow.      Haskel Khan, MD  PGY-1, Internal Medicine  [facility]  Nephrology Phone: [phone]      Electronically signed by Rosine Door, MD at 03/13/2022  7:27 PM EST    Associated attestation - Rosine Door, MD - 03/13/2022  7:27 PM EST  Formatting of this note might be different from the original.  Nephrology Attending:    Pt seen and examined. Reviewed with Dr. Erlinda Hong.  Agree with resident's note & findings. The assessment and plan of care reflects my direct input.  ESRD pt with SLE admitted for abdominal pain with N/V.  No s/s of uremia.  Not volume overloaded.  No HD today. Evaluate for HD in am.    Rosine Door, MD  [facility]  [facility] # [phone]  Pager# [phone]

## (undated) NOTE — Unmapped External Note (Signed)
Formatting of this note might be different from the original.    Problem: Knowledge Deficit  Goal: Patient will Participate in learning opportunities offered by staff  Outcome: Continue with Current Goal    Problem: Infection/Isolation (Risk/Actual)  Goal: Patient will be free of hospital acquired infection throughout hospitalization  Outcome: Continue with Current Goal    Problem: Pain/Comfort  Goal: Adequate pain control while hospitalized  Outcome: Continue with Current Goal    Problem: Fall Risk  Goal: Patient will remain as independent as possible  Outcome: Continue with Current Goal  Goal: Patient will have lower fall risk. Lower injury risk.  Outcome: Continue with Current Goal  Goal: Patient will remain safe from falls and injury  Outcome: Continue with Current Goal    Electronically signed by Duayne Cal, RN at 03/14/2022  8:25 PM EST

## (undated) NOTE — Nursing Note (Signed)
Formatting of this note might be different from the original.  Pt requested to speak to a supervisor. House nursing supervisor called and patient relations notified. Patient relations arrived to room but pt was in the bathroom. Pt relation waited for 10-15 min pt never came out of the restroom so she left. House supervisor called and arrived to bedside to speak with the pt. Pt stated she wanted to stay for dinner, pt made aware that she has been discharged, and writer wanted t make sure she spoke to someone before she left to hear her grievances. IV and tele removed. Pt received and reviewed discharge instructions.   Electronically signed by Simonne Come, LPN at [phone]  075-GRM PM EDT

## (undated) NOTE — ED Triage Notes (Signed)
Formatting of this note might be different from the original.  Although pt made aware of the risks of refusing the medication - she is refusing to take  Electronically signed by Janene Madeira, RN at 12/24/2021 12:48 AM EDT

## (undated) NOTE — ED Notes (Signed)
Formatting of this note might be different from the original.  Report given to [facility] charge RN  Electronically signed by Daleen Squibb, RN at 07/01/2022  5:56 PM EDT

## (undated) NOTE — Nursing Note (Signed)
Formatting of this note might be different from the original.  Pt stable and in NAD. Pt pain managed with PRN pain medication. Pt off unit for dialysis. Will transfer pt care to incoming day shift nurse.  Electronically signed by Duayne Cal, RN at 03/15/2022  7:22 AM EST

## (undated) NOTE — Consults (Signed)
Formatting of this note is different from the original.    [facility] Nephrology Progress Note     Patient ID: [facility] day: 1  Reason for consult: ESRD needing HD     Medications  Scheduled meds:    normal saline  10 mL Intravenous EVERY 8 HOURS    AmLODIPine  10 mg Oral Daily    apixaban  5 mg Oral BID    cefTRIAXone  1 g IV PUSH Q24H    sevelamer carbonate  1,600 mg Oral TID w/meals     Infusion meds:   PRN meds: promethazine, morphine, [COMPLETED] Insert Midline Catheter **AND** Maintain Midline Catheter **AND** normal saline **AND** sodium chloride, [COMPLETED] Insert Peripheral IV **AND** Maintain Peripheral IV **AND** normal saline **AND** sodium chloride, sennosides, polyethylene glycol, ondansetron    Summary Statement   Katherine [name] is a 69 y.o. female with a PMHx of  Lupus, ESRD, Superior Vena Cava Syndrome, and HTN  admitted for abdominal pain and missed HD session with her last session being 03/07/22 due to visiting [place] from [place] for family related issues.      Subjective     NAEO. Pt denies any CP and reports improvement of her abdominal pain.    Objective #1 24 hours     No UOP overnight     Objective #2   Vital Signs  Range: Temp:  [36.1 C (97 F)-36.3 C (97.3 F)] 36.3 C (97.3 F)  Heart Rate:  [65-78] 65  Respirations:  [18] 18  Blood Pressure: (143-169)/(96-102) 143/97  Current: BP (!) 143/97   Pulse 65   Temp 36.3 C (97.3 F) (Oral)   Resp 18   Ht 1.676 m (5' 6"$ )   Wt 85.1 kg (187 lb 9.6 oz)   SpO2 100%   BMI 30.28 kg/m     I/O:  No intake or output data in the 24 hours ending 03/14/22 1515  No intake/output data recorded.  No intake/output data recorded.    Physical Exam  Constitutional: NAD, pleasant  Eyes: Anicteric sclerae, EOMI bilaterally  HENMT: NC/AT, mucous membranes moist, supple neck  Cardiovascular: RRR, normal s1s2, no m/r/g, 2+ pulses radial/DP bilaterally, no JVD  Respiratory: CTAB, no wheezes, rales, rhonchi  Gastrointestinal: Soft,  NT, ND, normoactive +BS  Genitourinary: Deferred  Musculoskeletal: NROM, no edema, cyanosis  Skin: Warm, dry  Neurologic: AOx4, CN II-XII grossly intact  Psychiatric: Appropriate mood and affect  Hem/Lymph/Imm: No bruising, bleeding or petechiae appreciable    Labs/Imaging  Recent Labs   Lab 03/13/22  0126   WBC 2.2*   HGB 10.4*   HCT 32.4*   PLT 132*   MCH 30.9   MCHC 32.1   MCV 96.0     Recent Labs   Lab 03/13/22  0126   NA 139   K 4.2   CL 97*   CO2 25   BUN 28*   CREATININE 8.3*   GFR 6*   GLU 82   CALCIUM 8.7*   CALIUMALBADJ 8.7*   LABALB 4.3   PROT 7.0   BILITOT 0.3   BILIDIR <0.1   AST 24   ALT 20   ALKPHOS 104     No results for input(s): "PT", "APTT", "INR" in the last 168 hours.  No results for input(s): "PHART", "PCO2ART", "PO2ART" in the last 72 hours.  No results for input(s): "CKMB", "TROPONINI", "BNP" in the last 72 hours.    Invalid input(s): "CPKTOTAL"    Assessment  and Plan     #ESRD(MWF)  #Anemia of Chronic Disease  #AGMA  - Pt last reported HD session on Monday. Lives in [place] and dro[place] to [place] for emergent family case.   - PE negative for pericardial friction rub, asterixis  - Clinically mildly volume overloaded with 1+ PE  - Labs: BUN 28, Cr 8.3 with AG 17, Hgb 10.4  - Access RUE AVF  - Given patients current clinical picture, with normal electrolytes, and mildly elevated BUN and Cr. Will hold ooff on HD today.    #HTN  BP 150/90s  Home med: amlodipine 10 mg    #UTI  - Dysuria and increase in urinary frequency ~2 days   - +UA 3+ LE, and >50 WBC, started on CTX 1g    #SLE  - Dx over 10 years ago, without any medical management since starting HD ~7 years ago  - Denies any joint paint    Recommendations:  - NO HD today. Due to pt being stable.   - will schedule her for HD tomorrow but continue to monitor for now   - Collect Mg, PO4, PTH, Vit. D, Iron, TIBC, %Iron Sat, ferritin  - Collect HBV surface antigen  - Restart home BP medications  - Renally dose medications  - Avoid nephrotoxins   -  Monitor UOP  - Monitor renal function       Thank you for allowing [place] to participate in the care of this patient. Attending note to follow.    Freda Jackson, MD  PGY-3  [facility]  03/14/2022  3:15 PM EST    Electronically signed by Rosine Door, MD at 03/14/2022  4:38 PM EST    Associated attestation - Rosine Door, MD - 03/14/2022  4:38 PM EST  Formatting of this note might be different from the original.  Nephrology Attending:    Pt seen and examined. Reviewed with Dr. Michela Pitcher.  Agree with resident's note & findings. The assessment and plan of care reflects my direct input.  ESRD pt with SLE admitted for abdominal pain with N/V. Symptoms persist.  No s/s of uremia.  Not volume overloaded.  No HD today. For HD in am. Orders placed.    Rosine Door, [facility]  [facility] # [phone]  Pager# [phone]

## (undated) NOTE — Progress Notes (Signed)
Formatting of this note might be different from the original.  Discharge Planning Note    SW spoke with patient and she is requesting she be provided with transportation back  to [place], [place].  SW confirmed with patient that she did not have family/friends to provide transportation, patients reply was '" if I had transportation you think I would be asking you'.      SW reached out to management for authorization.    SW will continue to follow to assist with discharge needs.    Patient provided with a bus pass.     Suezanne Jacquet, MSW    Electronically signed by Dustin Flock, Social Worker II at 12/24/2021  4:22 PM EDT

## (undated) NOTE — ED Notes (Signed)
Formatting of this note might be different from the original.  Patient is resting comfortably. No distress.  Electronically signed by Kandice Moos, RN at 03/13/2022  2:16 PM EST

## (undated) NOTE — Progress Notes (Signed)
Formatting of this note might be different from the original.  Katherine [name]   69 y.o. female with a PMHx of  Lupus, ESRD, Superior Vena Cava Syndrome, and HTN  admitted for abdominal pain and missed HD session with her last session being 03/07/22 due to visiting [name] from [place] for family related issues.     Height: 167.6 cm (5' 6"$ )    Weight: 100.5 kg (221 lb 8 oz)   Body mass index is 35.75 kg/m.  %Weight Change: Pt denies recent wt changes.     RBW: 70 kg (BMI 24.9kg/m ABW)  EEN: 2100-2450 kcal (25-30 kcal/kg RBW)   EPN: 77-105 g (1.1-1.5 g/kg RBW)    -Needs adjusted for ESRD on HD     RD Assessment: Pt was awake and in good spirits upon visit. RD observed lunch tray being delivered to bed side. Pt says that her appetite is good however, she is currently sleepy and will eat once she has woken up more. No documentation for % of meal consumed noted via flow sheet. Pt denies N/V and chewing/swallowing issues. PTA she says that appetite was good and that she typically eats 2-3 meals per day.        Nutrition History:   Medical Tests/Procedures: Ct abdomen/pelvis   Physical Findings: NFPE performed with no muscle or fat wasting noted.   Level of Malnutrition: Nourished  Wound Assessment: No recent wounds or pressure injuries noted via avatar.   Current Nutrition Therapy: DIET REGULAR  Significant Labs: Cl 90, BUN 31, Creatinine 8.7, GFR 6, Ca 8.2, Phos 5.2, Vitamin D 10  Significant Medications: Norvasc, Renvela, Zofran-PRN     Food Insecurity: Negative Food Insecurity     Education Needs: Pt declined education at this time 2/2 just waking up.     NUTRITION DIAGNOSIS:  Increased nutrient needs (energy and protein) NI-5.1 related to altered renal function as evidence by ESRD on HD      NUTRITION INTERVENTIONS:Energy-modified diet and Protein-modified diet     PLAN:  Adjust to Renal Diet Level 2 with low phos restriction   -Encourage and  monitor PO intake   -Document % of meal consumed as able     2. Consider adding phosphate binder if pt continues to have hyperphosphatemia     Nutrition Goal: Patient to consume greater than 75% meals Throughout stay  Monitoring/Evaluation: PO intake, labs, wt, BM   Goal Outcome/Progress: New goal established    Erin Hearing MS, RDN, LD   [phone]  For weekend/holiday coverage please page [phone]  Electronically signed by Phineas Semen, RD at 03/16/2022  1:12 PM EST

## (undated) NOTE — Unmapped External Note (Signed)
Formatting of this note might be different from the original.    Problem: Knowledge Deficit  Goal: Patient will verbalize and/or demonstrate understanding of teaching by discharge  Outcome: Continue with Current Goal  Goal: Patient will Participate in learning opportunities offered by staff  Outcome: Continue with Current Goal    Problem: Infection/Isolation (Risk/Actual)  Goal: Patient will be free of hospital acquired infection throughout hospitalization  Outcome: Continue with Current Goal    Problem: Pain/Comfort  Goal: Adequate pain control while hospitalized  Outcome: Continue with Current Goal    Problem: Fall Risk  Goal: Patient will remain as independent as possible  Outcome: Continue with Current Goal  Goal: Patient will have lower fall risk. Lower injury risk.  Outcome: Continue with Current Goal  Goal: Patient will remain safe from falls and injury  Outcome: Continue with Current Goal  Goal: Patient/family will understand fall prevention measures  Outcome: Continue with Current Goal  Goal: Patient/family will understand injury reduction measures  Outcome: Continue with Current Goal  Goal: Patient/family will comply with fall program  Outcome: Continue with Current Goal  Goal: Patient/family will verbalize fall prevention strategies to implement after discharge  Outcome: Continue with Current Goal    Problem: Hemodialysis  Goal: Minimize complications related to the disease process.  Outcome: Continue with Current Goal  Goal: Knowledge of the prescribed therapeutic regimen will improve  Outcome: Continue with Current Goal  Goal: Remain free from infection  Outcome: Continue with Current Goal    Electronically signed by Eli Phillips, RN at 03/17/2022  6:45 PM EST

## (undated) NOTE — Unmapped External Note (Signed)
Formatting of this note might be different from the original.    Problem: Knowledge Deficit  Goal: Patient will verbalize and/or demonstrate understanding of teaching by discharge  03/17/2022 1947 by Eli Phillips, RN  Outcome: Goal Met  03/17/2022 1845 by Eli Phillips, RN  Outcome: Continue with Current Goal  Goal: Patient will Participate in learning opportunities offered by staff  03/17/2022 1947 by Eli Phillips, RN  Outcome: Goal Met  03/17/2022 1845 by Eli Phillips, RN  Outcome: Continue with Current Goal    Problem: Infection/Isolation (Risk/Actual)  Goal: Patient will be free of hospital acquired infection throughout hospitalization  03/17/2022 1947 by Eli Phillips, RN  Outcome: Goal Met  03/17/2022 1845 by Eli Phillips, RN  Outcome: Continue with Current Goal    Problem: Pain/Comfort  Goal: Adequate pain control while hospitalized  03/17/2022 1947 by Eli Phillips, RN  Outcome: Goal Met  03/17/2022 1845 by Eli Phillips, RN  Outcome: Continue with Current Goal    Problem: Fall Risk  Goal: Patient will remain as independent as possible  03/17/2022 1947 by Eli Phillips, RN  Outcome: Goal Met  03/17/2022 1845 by Eli Phillips, RN  Outcome: Continue with Current Goal  Goal: Patient will have lower fall risk. Lower injury risk.  03/17/2022 1947 by Eli Phillips, RN  Outcome: Goal Met  03/17/2022 1845 by Eli Phillips, RN  Outcome: Continue with Current Goal  Goal: Patient will remain safe from falls and injury  03/17/2022 1947 by Eli Phillips, RN  Outcome: Goal Met  03/17/2022 1845 by Eli Phillips, RN  Outcome: Continue with Current Goal  Goal: Patient/family will understand fall prevention measures  03/17/2022 1947 by Eli Phillips, RN  Outcome: Goal Met  03/17/2022 1845 by Eli Phillips, RN  Outcome: Continue with Current Goal  Goal: Patient/family will understand injury reduction measures  03/17/2022 1947 by Eli Phillips, RN  Outcome: Goal Met  03/17/2022 1845 by Eli Phillips,  RN  Outcome: Continue with Current Goal  Goal: Patient/family will comply with fall program  03/17/2022 1947 by Eli Phillips, RN  Outcome: Goal Met  03/17/2022 1845 by Eli Phillips, RN  Outcome: Continue with Current Goal  Goal: Patient/family will verbalize fall prevention strategies to implement after discharge  03/17/2022 1947 by Eli Phillips, RN  Outcome: Goal Met  03/17/2022 1845 by Eli Phillips, RN  Outcome: Continue with Current Goal    Problem: Hemodialysis  Goal: Minimize complications related to the disease process.  03/17/2022 1947 by Eli Phillips, RN  Outcome: Goal Met  03/17/2022 1845 by Eli Phillips, RN  Outcome: Continue with Current Goal  Goal: Knowledge of the prescribed therapeutic regimen will improve  03/17/2022 1947 by Eli Phillips, RN  Outcome: Goal Met  03/17/2022 1845 by Eli Phillips, RN  Outcome: Continue with Current Goal  Goal: Remain free from infection  03/17/2022 1947 by Eli Phillips, RN  Outcome: Goal Met  03/17/2022 1845 by Eli Phillips, RN  Outcome: Continue with Current Goal    Electronically signed by Eli Phillips, RN at 03/17/2022  7:47 PM EST

## (undated) NOTE — ED Notes (Signed)
Formatting of this note might be different from the original.  Vasc team at bedside for midline placement.  Electronically signed by Pricilla Larsson, RN at 03/13/2022  3:14 AM EST

## (undated) NOTE — ED Notes (Signed)
Formatting of this note might be different from the original.  Pt states that she is a hard stick and will not see anyone to draw her blood unless they have an ultrasound machine.   Electronically signed by Vilinda Flake, RN at 03/12/2022  7:33 PM EST

## (undated) NOTE — Progress Notes (Signed)
Formatting of this note is different from the original.    [place] Team E Progress Notes      Patient ID: [facility] day:  1  Admit Date: 03/12/2022 11:27 PM  Today's date: 03/14/2022    Summary Statement     Katherine [name] is a 72 y.o. female with a PMHx of  Lupus, ESRD, Superior Vena Cava Syndrome, and HTN  admitted for abdominal pain. Patient states she has been having nausea for the last couple of days. Complains of supra pubic pain that wraps around to the back. Endorses some increased urinary frequency and occasional dysuria.     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 The patient is conversant.   Eyes: Anicteric sclerae, moist conjunctivae; no lid-lag  HENT: Atraumatic  Neck: Trachea midline. FROM, supple, tortuous veins noted  Lungs: Trachea in midline. Equal chest expansions bilaterally. Clear breath sounds heard bilaterally.  No wheezes or rales appreciated. No peripheral cyanosis or clubbing.   CV: Regular rate and rhythm. Normal S1/S2. No S3/S4. No murmurs, rubs, or gallops.   Abdomen: Non-distended. No visible pulsations. Normoactive bowel sounds. No voluntary or involuntary guarding; or rebound tenderness; no obvious organomegaly. Mild  tenderness noted   Musculoskeletal: Normal bulk and tone, left UE fistula noted, RUE healed fistula noted   Skin: Normal temperature, turgor and texture  Neurological:  CN 2-12 are grossly intact without any deficits.   Psych: Appropriate affect, alert and oriented to person, place and time     Medications    Scheduled Medications   normal saline  10 mL Intravenous EVERY 8 HOURS    AmLODIPine  10 mg Oral Daily    apixaban  5 mg Oral BID    cefTRIAXone  1 g IV PUSH Q24H    sevelamer carbonate  1,600 mg Oral TID w/meals     Continuous Infusions     PRN Medications  [COMPLETED] Insert Midline Catheter **AND** Maintain Midline Catheter **AND** normal saline **AND** sodium chloride, [COMPLETED] Insert Peripheral IV **AND** Maintain Peripheral IV **AND** normal saline **AND** sodium chloride, sennosides, polyethylene glycol, morphine, ondansetron    Labs    Recent Labs     03/13/22  0126   NA 139   K 4.2   CL 97*   CO2 25   BUN 28*   CREATININE 8.3*   GLU 82   PROT 7.0     Recent Labs     03/13/22  0126   WBC 2.2*   HGB 10.4*   HCT 32.4*   PLT 132*   MCH 30.9   MCHC 32.1   MCV 96.0     No results for input(s): "PT", "APTT", "INR" in the last 72 hours.    No results for input(s): "PHART", "PCO2ART", "PO2ART" in the last 72 hours.    No results for input(s): "CKMB", "TROPONINI", "BNP" in the last 72 hours.    Invalid input(s): "CPKTOTAL"    Urine Analysis      Component Ref Range & Units 03/13/22 0426   COLOR-URINE Yellow, Straw, Colorless Yellow   CLARITY-URINE Clear, Hazy Hazy   SPECIFIC GRAVITY-URINE 1.001 - 1.035 1.009   PH, QUANT-URINE 5.0 - 8.0 8.0   PROTEIN,QUALITATIVE-URINE Negative <30 mg/dL 2+=100 mg/dL Abnormal    GLUCOSE,QUALITATIVE-URINE Negative < 50 mg/dL Negative < 50 mg/dL   KETONES,QUALITATIVE-URINE Negative < 5 mg/dL Negative < 5 mg/dL   BILIRUBIN,QUALITATIVE-URINE Negative < 2.0 mg/dL Negative < 2.0 mg/dL   UROBILINOGEN,QUALITATIVE-URINE Negative <2.0 mg/dL Negative <2.0 mg/dL    HEMOGLOBIN,QUALITATIVE-URINE Negative <0.03 mg/dL Negative <0.03 mg/dL   NITRITE,BACTERIAL-URINE Negative Negative   LEUKOCYTE ESTERASE-URINE Negative <25 WBC/uL 3+=500 WBC/uL Abnormal    WBC-URINE None / HPF, <2 / HPF > 50 / HPF Abnormal    RBC-URINE < 3 / HPF 3-10 / HPF Abnormal    SQUAMOUS EPITHELIAL CELL-URINE None / HPF, <2 / HPF 6 - 10 /HPF Abnormal    BACTERIA-URINE (none) Present Abnormal        Imaging:    03/13/22  Narrative & Impression   EXAM: CT ABDOMEN AND PELVIS WITH IV CONTRAST ONLY    CLINICAL INDICATION:  Abdominal pain, acute, nonlocalized     TECHNIQUE: Following administration of non-ionic IV contrast, postcontrast images through the abdomen and pelvis were obtained.    COMPARISON: None    FINDINGS:   Lower Thorax: Normal.    Liver: No suspicious hepatic lesions. Mild periportal edema.    Gallbladder/Biliary Tree: Excreted contrast within the gallbladder lumen    Spleen: Indeterminate hypoattenuating lesion within the spleen measuring up to 1.6 cm    Pancreas: No pancreatic ductal dilation.    Adrenal Glands: Normal.    Kidneys/Ureters: Innumerable cystic lesions within the bilateral kidneys most of which are too small to characterize. No hydronephrosis.    Gastrointestinal: No bowel obstruction. Colonic diverticulosis without inflammation.     Bladder: Decompressed.    Uterus/Adnexa: 3.0 x 1.9 cm cystic lesion of the left adnexa likely represents a benign cyst.    Lymph Nodes: No pathologically enlarged lymph nodes    Vessels: Aorta is normal in caliber. Proximal most celiac trunk and proximal most SMA are patent. Main portal vein is patent.    Peritoneum/Retroperitoneum: No organized fluid collections.    Bones/Soft Tissues: Diffuse abdominal wall collaterals as well as partially visualized chest wall collaterals.    *  IMPRESSION:   1.    No acute intra-abdominal or intrapelvic processes.  2.    Innumerable bilateral renal cystic lesions most of which are too small to characterize which  could be secondary to polycystic renal disease. No hydronephrosis  3.    Diffuse engorged subcutaneous vessels involving the partially visualized thoracic subcutaneous soft tissues as well as the abdominal wall. This is of uncertain etiology.  4.    Additional chronic/incidental findings, as above.      Assessment and Plan   Katherine [name] is a 87 y.o. female with a PMHx of Lupus,  ESRD, Superior Vena Cava Syndrome, and HTN  admitted for abdominal pain.       #Abdominal Pain   Assessment:  - Lipase: 30, Liver Enzymes unremarkable   - Complaints of persistent nausea and abdominal pain   - CT AB Reading:   1.    No acute intra-abdominal or intrapelvic processes.  2.    Innumerable bilateral renal cystic lesions most of which are too small to characterize which could be secondary to polycystic renal disease. No hydronephrosis  3.    Diffuse engorged subcutaneous vessels involving the partially visualized thoracic subcutaneous soft tissues as well as the abdominal wall. This is of uncertain etiology.  4.    Additional chronic/incidental findings, as above.  - Patient has had several ED visits and admission   Plan:  - Pain management 66m morphine Q4 prn   - Zofran 475mIV Q8prn fo nausea   - Will add Promethazine   - Advance diet as tolerated   - Follow up final read CTAB   - Continue to monitor     #ESRD   Assessment:  - On HD MWF endorses missing some sessions but [place]  Plan:  - Appreciate recommendations per cardiology     #SLE   Assessment:  - Does not appear to be on any specific management  Plan:  - Can consider Rheum consult if patient fails to improve     #Hypertension   Assessment:  - Per patient is only taking Amlodipine 1057m - BP 150s/90s may be 2/2 to c/o of pain and discomfort  Plan:  - Continue home med  - Continue to monitor       #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   Assessment:  - Home Med: Apixiban 5mg88mD   Plan:  - Continue home medication     #Concern for UTI   Assessment:  - States that she has had increased frequency with occasional dysuria   - UA is LE positive   Plan:  - Continue Ceftriaxone     -----------------------------------------------------------------------------------------------  Activity                         :  As tolerated   Diet                              :  Regular   Prophylaxis                 :  On Home Anticoagulation   Consults                      :  Nephrology  Code status                 :  Full   Disposition                  :   Pending clinical advancement   Discharge planning     :   Pending    CarmAmparo Bristol  Family Medicine, PGY-3  [facility]  Phone: [phone] call Cross Cover Mon-Fri after 3pm, and Sat-Sun after 12pm  [facility] Cross cover: [phone] Hospital[facility] Cross  cover: [phone]   [facility] Nighttime Cross cover: [phone] (7pm-7am)     Electronically signed by Hildred Laser, MD at 03/16/2022 10:32 AM EST    Associated attestation - Hildred Laser, MD - 03/16/2022 10:32 AM EST  Formatting of this note might be different from the original.  Patient seen and examined.  The resident team and I discussed the patient and agree with assessment plan.

## (undated) NOTE — ED Provider Notes (Signed)
Formatting of this note is different from the original.     EMERGENCY MEDICINE  RAPID MEDICAL EXAMINATION NOTE       03/12/2022  2:39 AM EST    Subjective:   Chief Complaint:   Chief Complaint   Patient presents with    Abdominal Pain     Pt endorses chest pain/flank pain/abd pain 9/10 for the past 2 days. Pt also endorses blood in vomit and bowel movements.      HPI:(at least one Chief Complaint detail)  Narrative: Katherine [name] is a 26 yo female with ho lupus who presents with co cp and abdominal pain after missed dialysis. Pt reports she is typically dialyzed MWF, however she traveled to [place] to see about her family member with last dialysis session on Monday. PT reports over the past 2 days she has had achy, diffuse pain to her abdomen. She reports multiple episodes of nausea/vomiting and diarrhea and states she saw streaking of blood in her vomit and diarrhea. She denies melena.    Past Medical History: The information below is populated from the nursing note but has been reviewed by the provider.  Past Medical History:   Diagnosis Date    ESRD (end stage renal disease) on dialysis (CMS-HCC)      History reviewed. No pertinent surgical history.   No family history on file.  Social History     Socioeconomic History    Marital status: Single     Spouse name: None    Number of children: None    Years of education: None    Highest education level: None   Occupational History    None   Tobacco Use    Smoking status: Never    Smokeless tobacco: Never   Substance and Sexual Activity    Alcohol use: Never    Drug use: Never    Sexual activity: Not Currently   Other Topics Concern    None   Social History Narrative    None     Social Determinants of Health     Financial Resource Strain: Not on file   Food Insecurity: Not on file   Transportation Needs: Not on file   Housing Stability: Not on file     Objective: (at least two organ systems)  VS: Temp: 36.9 C (98.4 F)  Heart Rate: 78  Respirations: 16  Blood Pressure:  (!) 159/100  SpO2: 100 %  Pt alert  RRR  Lungs clear  Abdomen soft, nt, nd  Fistula noted to RUE.   No significant leg swelling noted.      Assessment/Diagnosis(es)   Plan/Recommendation    1. Pt presents with diffuse abdominal pain in setting of missed dialysis. Pt also reports some streaking of blood in her vomit and stool.  VS notable only for mild elevation in bp. Plan for basic labs and reassessment.     1. Pending Ct Abdominal/Pelvis      Disposition: PENDING CT    Condition: stable    Documentation of Risk  In support of CPT [phone], the chart reflects that the patient was seen for a new problem, has at least one system reviewed, has at least two organ systems examined, and had two or more data points ordered/reviewed.     Zenaida Deed, MD     Emergency Department       Zenaida Deed, MD  03/23/22 1239    Electronically signed by Zenaida Deed, MD at 03/23/2022 12:39  PM EST

## (undated) NOTE — ED Notes (Signed)
Formatting of this note might be different from the original.  [facility] called by unit Diplomatic Services operational officer. Pt on transport list  Electronically signed by Daleen Squibb, RN at 07/01/2022  5:24 PM EDT

## (undated) NOTE — Unmapped External Note (Signed)
Formatting of this note might be different from the original.    Problem: Pain/Comfort  Goal: Adequate pain control while hospitalized  Outcome: Progressing Toward Goal    Problem: Hemodialysis  Goal: Minimize complications related to the disease process.  Outcome: Progressing Toward Goal  Goal: Knowledge of the prescribed therapeutic regimen will improve  Outcome: Progressing Toward Goal  Goal: Remain free from infection  Outcome: Progressing Toward Goal    Problem: Pain/Comfort  Goal: Adequate pain control while hospitalized  Outcome: Progressing Toward Goal    Problem: Fall Risk  Goal: Patient will remain as independent as possible  Outcome: Progressing Toward Goal  Goal: Patient will have lower fall risk. Lower injury risk.  Outcome: Progressing Toward Goal  Goal: Patient will remain safe from falls and injury  Outcome: Progressing Toward Goal  Goal: Patient/family will understand fall prevention measures  Outcome: Progressing Toward Goal  Goal: Patient/family will understand injury reduction measures  Outcome: Progressing Toward Goal  Goal: Patient/family will comply with fall program  Outcome: Progressing Toward Goal  Goal: Patient/family will verbalize fall prevention strategies to implement after discharge  Outcome: Progressing Toward Goal    Electronically signed by Laretta Alstrom, RN at 03/17/2022  1:44 AM EST

## (undated) NOTE — Progress Notes (Signed)
Formatting of this note is different from the original.    [facility] Nephrology Progress Note     Patient ID: [facility] day: 3  Reason for consult: ESRD needing HD     Medications  Scheduled meds:    cefdinir  300 mg Oral Every Other Day    normal saline  10 mL Intravenous EVERY 8 HOURS    AmLODIPine  10 mg Oral Daily    apixaban  5 mg Oral BID    sevelamer carbonate  1,600 mg Oral TID w/meals     Infusion meds:   PRN meds: promethazine, morphine, [COMPLETED] Insert Midline Catheter **AND** Maintain Midline Catheter **AND** normal saline **AND** sodium chloride, [COMPLETED] Insert Peripheral IV **AND** Maintain Peripheral IV **AND** normal saline **AND** sodium chloride, sennosides, polyethylene glycol, ondansetron    Summary Statement   Katherine [name] is a 35 y.o. female with a PMHx of  Lupus, ESRD, Superior Vena Cava Syndrome, and HTN  admitted for abdominal pain and missed HD session with her last session being 03/07/22 due to visiting [place] from [place] for family related issues.      Subjective     NAEON.    The patient was seen & assessed at the bedside she stated that she was doing well and had no complaints. She reports that she tolerated dialysis well [id number] without complications.     HD 12/26 with 2L of fluid removed.     Objective #2   Vital Signs  Range: Temp:  [36.7 C (98.1 F)-37.1 C (98.8 F)] 36.7 C (98.1 F)  Heart Rate:  [66-77] 72  Respirations:  [18-19] 19  Blood Pressure: (122-137)/(72-82) 122/72  Current: BP 122/72   Pulse 72   Temp 36.7 C (98.1 F) (Oral)   Resp 19   Ht 1.676 m (5' 6"$ )   Wt 100.5 kg (221 lb 8 oz)   SpO2 99%   BMI 35.75 kg/m     I/O:  No intake or output data in the 24 hours ending 03/16/22 1558    I/O last 3 completed shifts:  In: 500   Out: 2500   No intake/output data recorded.    Physical Exam  GENERAL: Middle age female seen laying comfortably in bed  CVS: RRR, normal s1s2, no m/r/g, 2+ pulses radial/DP bilaterally, no JVD  RESP:  CTAB, no wheezes, rales, rhonchi  GI: Soft, NT, ND, normoactive +BS  MSK: RUE AV Fistula with audible bruit and palpable thrill  NEURO: AOx4    Labs/Imaging  Recent Labs   Lab 03/13/22  0126 03/15/22  0924 03/16/22  0241   WBC 2.2* 2.2* 2.7*   HGB 10.4* 9.5* 9.2*   HCT 32.4* 28.2* 27.3*   PLT 132* 123* 118*   MCH 30.9 31.4 31.5   MCHC 32.1 33.6 33.6   MCV 96.0 94.0 94.0     Recent Labs   Lab 03/13/22  0126 03/15/22  1003 03/15/22  1004 03/16/22  0241   NA 139 135  --  135   K 4.2 3.5  --  4.3   CL 97* 93*  --  90*   CO2 25 29  --  30   BUN 28* 23*  --  31*   CREATININE 8.3* 6.8*  --  8.7*   GFR 6* 8*  --  6*   GLU 82 76  --  94   CALCIUM 8.7* 8.4* 8.4* 8.2*   CALIUMALBADJ 8.7* 8.4* 8.4* 8.2*   MG  --  1.9  --  2.2   PHOS  --  2.9  --  5.2*   LABALB 4.3 4.2 4.2 3.7   PROT 7.0 6.8  --  6.5   BILITOT 0.3 0.3  --  0.2*   BILIDIR <0.1 <0.1  --  <0.1   AST 24 21  --  22   ALT 20 20  --  21   ALKPHOS 104 107  --  109     No results for input(s): "PT", "APTT", "INR" in the last 168 hours.  No results for input(s): "PHART", "PCO2ART", "PO2ART" in the last 72 hours.  No results for input(s): "CKMB", "TROPONINI", "BNP" in the last 72 hours.    Invalid input(s): "CPKTOTAL"    Assessment and Plan     #ESRD (MWF) RUE AV Fistula   #Anemia of ESRD  #AGMA  #CKD MBD  #Secondary Hyperparathyroidism   #HTN  - Pt last reported HD session on Monday. Lives in [place] and drove down to [place] for emergent family situation.   - Access RUE AVF  - Hgb 9.2  - Ferritin 1,582, Iron 216  - Vit D 10, PTH 501.6   - HBV surface antigen negative   - Potassium 3.5, BUN 23, Cr 6.8   Last dialysis 12/26. The patient tolerated the treatment well with 2L removed     #UTI  - Dysuria and increase in urinary frequency  - +UA 3+ LE, and >50 WBC, started on ceftriaxone 1g    #SLE  - Dx over 10 years ago, without any medical management since starting HD ~7 years ago  - Denies any joint paint  CT abdomen and pelvis showed Innumerable bilateral renal cystic lesions most  of which are too small to characterize concerning for possible polycystic renal disease. She denied any knowledge of cysts or history of cysts in the past      Recommendations:  - HD MWF  - Continue home BP medications  - Renally dose medications  - Avoid nephrotoxins   - Monitor UOP  - Monitor renal function     #Hyperphosphatemia  Assessment:  Acute  Likely 2/2 ESRD  Phos 5.2  Recommendations:   - Continue Sevelamer 1614m TID      Thank you for allowing [facility] participate in the care of this patient. Attending note to follow.    DAvie Echevaria MBBS  PGY-1  [facility]  03/16/2022  3:15 PM EST    Electronically signed by BRosine Door MD at 03/16/2022 10:57 PM EST    Associated attestation - BRosine Door MD - 03/16/2022 10:57 PM EST  Formatting of this note might be different from the original.  Nephrology Attending:    Pt seen and examined. Reviewed with Dr. FMarijean Bravo  Agree with resident's note & findings. The assessment and plan of care reflects my direct input.  No HD today.  Next HD on Friday.  D/C planning.    KRosine Door MFrontierNephrology  [facility]# [phone] Pager# [phone]

## (undated) NOTE — ED Provider Notes (Signed)
Formatting of this note might be different from the original.  Patient presents from [facility] with left flank pain.  She has been missing dialysis recently.  She was noted to be hyperkalemic and in need of dialysis.  There is no evidence of respiratory symptoms.  Chest x-ray does not show any fluid overload.  She does have a known history of a left kidney hematoma.  She had a CT of her abdomen pelvis yesterday which showed improvement of the left kidney hematoma.  Her pain seems to be consistent with the pain it has been ongoing with this hematoma.  Will consult nephrology for dialysis.    Rolan Bucco, MD  07/01/22 1840    Electronically signed by Rolan Bucco, MD at 07/01/2022  6:40 PM EDT

## (undated) NOTE — Progress Notes (Signed)
Formatting of this note is different from the original.  Hemodialysis Treatment Summary Note    Name: Katherine [name] Admission Date: 03/12/2022 11:27 PM   Sex: female Attending Provider: Hildred Laser, MD   MRN: [id number] DOB: 1985-09-01   Age: 27 y.o.      Treatment Start Date: 03/15/22  Treatment Start Time: 0730  Treatment End Date: (P) 03/15/22  Treatment End Time: (P) 1030    Treatment Status: (P) Completed  Reason Treatment Not Completed: --    Albumin (mL): (P) 0  Normal Saline (mL): (P) 0  Blood Transfusion (mL): (P) 0  Net Fluid Bal: (P) -2000 mL    Patient remained stable during treatment, tolerated well. SBAR given to Hinda Lenis, RN    Signed: Tyna Jaksch, RN  Electronically signed by Tyna Jaksch, RN at 03/15/2022 10:48 AM EST

## (undated) NOTE — Unmapped External Note (Signed)
Formatting of this note might be different from the original.    Problem: Increased nutrient needs (NI-5.1)  Goal: Food and/or Nutrient Delivery  Description: Individualized approach for food/nutrient provision.  Outcome: New Goal Established    Electronically signed by Phineas Semen, RD at 03/16/2022  1:12 PM EST

## (undated) NOTE — Progress Notes (Signed)
Formatting of this note is different from the original.    [facility] Nephrology Progress Note     Patient ID: [facility] day: 2  Reason for consult: ESRD needing HD     Medications  Scheduled meds:    heparin (porcine)  2,000 Units Intravenous Once in dialysis    heparin (porcine)  1,000 Units Intravenous Once in dialysis    epoetin alfa  4,000 Units Intravenous Once in dialysis    paricalcitol  1 mcg Intravenous Once in dialysis    normal saline  10 mL Intravenous EVERY 8 HOURS    AmLODIPine  10 mg Oral Daily    apixaban  5 mg Oral BID    cefTRIAXone  1 g IV PUSH Q24H    sevelamer carbonate  1,600 mg Oral TID w/meals     Infusion meds:   PRN meds: promethazine, morphine, albumin human, [COMPLETED] Insert Midline Catheter **AND** Maintain Midline Catheter **AND** normal saline **AND** sodium chloride, [COMPLETED] Insert Peripheral IV **AND** Maintain Peripheral IV **AND** normal saline **AND** sodium chloride, sennosides, polyethylene glycol, ondansetron    Summary Statement   Katherine [name] is a 44 y.o. female with a PMHx of  Lupus, ESRD, Superior Vena Cava Syndrome, and HTN  admitted for abdominal pain and missed HD session with her last session being 03/07/22 due to visiting [place] from [place] for family related issues.      Subjective     No acute events overnight or current concerns, set to receive dialysis today     Objective #2   Vital Signs  Range: Temp:  [36.7 C (98.1 F)-37.2 C (99 F)] 36.8 C (98.2 F)  Heart Rate:  [55-74] 69  Respirations:  [18] 18  Blood Pressure: (126-155)/(77-103) 152/103  Current: BP (!) 152/103   Pulse 69   Temp 36.8 C (98.2 F) (Tympanic)   Resp 18   Ht 1.676 m (5' 6"$ )   Wt 95.5 kg (210 lb 9.6 oz)   SpO2 100%   BMI 33.99 kg/m     I/O:  No intake or output data in the 24 hours ending 03/15/22 0811  No intake/output data recorded.  No intake/output data recorded.    Physical Exam  Constitutional: NAD, pleasant  Cardiovascular: RRR, normal s1s2,  no m/r/g, 2+ pulses radial/DP bilaterally, no JVD  Respiratory: CTAB, no wheezes, rales, rhonchi  Gastrointestinal: Soft, NT, ND, normoactive +BS  Neurologic: AOx4    Labs/Imaging  Recent Labs   Lab 03/13/22  0126   WBC 2.2*   HGB 10.4*   HCT 32.4*   PLT 132*   MCH 30.9   MCHC 32.1   MCV 96.0     Recent Labs   Lab 03/13/22  0126   NA 139   K 4.2   CL 97*   CO2 25   BUN 28*   CREATININE 8.3*   GFR 6*   GLU 82   CALCIUM 8.7*   CALIUMALBADJ 8.7*   LABALB 4.3   PROT 7.0   BILITOT 0.3   BILIDIR <0.1   AST 24   ALT 20   ALKPHOS 104     No results for input(s): "PT", "APTT", "INR" in the last 168 hours.  No results for input(s): "PHART", "PCO2ART", "PO2ART" in the last 72 hours.  No results for input(s): "CKMB", "TROPONINI", "BNP" in the last 72 hours.    Invalid input(s): "CPKTOTAL"    Assessment and Plan     #ESRD(MWF)  #Anemia of  Chronic Disease  #AGMA  - Pt last reported HD session on Monday. Lives in [place] and droves down to [place] for emergent family case.   - Access RUE AVF  - Hgb 10.4  - Ferritin 1,582  -Iron 216  - Vit D 10  -HBV surface antigen negative   - Potassium 3.5, BUN 23, Cr 6.8   Dialysis today removed 2 L    #HTN  Systolic BP ranged from [id number] to 150s   Home med: amlodipine 10 mg    #UTI  - Dysuria and increase in urinary frequency  - +UA 3+ LE, and >50 WBC, started on ceftriaxone 1g    #SLE  - Dx over 10 years ago, without any medical management since starting HD ~7 years ago  - Denies any joint paint  CT abdomen and pelvis showed Innumerable bilateral renal cystic lesions most of which are too small to characterize concerning for possible polycystic renal disease. She denied any knowledge of cysts or history of cysts in the past      Recommendations:  -HD today   -Sevelamer carbonate   - Continue home BP medications  - Renally dose medications  - Avoid nephrotoxins   - Monitor UOP  - Monitor renal function     Thank you for allowing [place] to participate in the care of this patient. Attending note to  follow.    Velta Addison, MD  PGY-1  [facility] Nephrology  03/15/2022  3:15 PM EST    Electronically signed by Rosine Door, MD at 03/15/2022  6:52 PM EST    Associated attestation - Rosine Door, MD - 03/15/2022  6:52 PM EST  Formatting of this note might be different from the original.  Nephrology Attending:    Pt seen and examined. Reviewed with Dr. Elisabeth Cara.  Agree with resident's note & findings. The assessment and plan of care reflects my direct input.  HD today- tolerated it well.    Rosine Door, [facility]  [facility] # [phone]  Pager# [phone]

## (undated) NOTE — Progress Notes (Signed)
Formatting of this note might be different from the original.  Patient medicated with morphine for abdominal pain and relief obtained. Fully alert , oriented x 4 . No significant changes at this moment, plan of care in progress  Laretta Alstrom, RN    Electronically signed by Laretta Alstrom, RN at 03/17/2022  2:02 AM EST

## (undated) NOTE — Nursing Note (Signed)
Formatting of this note might be different from the original.  Patient is discharged,discharge instructions given and explained to patient,received all prescribed medications and Peripheral IV removed Fistula to Lt arm intact, thrills and bruit present on auscultation.Patient is ambulatory and stated she has an arranged private transportation.  Electronically signed by Eli Phillips, RN at 03/17/2022  7:46 PM EST

## (undated) NOTE — ED Provider Notes (Signed)
Formatting of this note is different from the original.  HPI:     Chief Complaint   Patient presents with    Abdominal Pain    Vomiting    Nausea    Neck Pain    Leg Pain    Flank Pain    Fall    Leg Swelling     HPI  Patient is a 59 year old female with history of lupus nephritis end-stage renal disease on hemodialysis.  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  She reports she stopped at multiple emergency departments requesting dialysis but none of them have been able to treat her because she did not meet their criteria.  Patient arrives today complaining of neck pain leg pain flank pain and stabbing pain x2 days.  She is also having persistent nausea and vomiting.  She reports she has a complex medical history with previous port and AV fistula on the same side.  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  She is also complaining of some blood in her stools.  She still makes urine.  Reports swelling has worsened over the last day.  She did report that she fell and hit her head yesterday but did not have any loss of consciousness.  She normally gets dialysis Monday Wednesdays and Fridays.  Her last dialysis was last week Wednesday.  She reports multiple drug allergies and states the only pain medication she can take is Dilaudid.  Also request for Phenergan for nausea and vomiting.  Reports she has been unable to keep down any of her blood pressure medications due to the nausea and vomiting.  Denies any history of drug use tobacco use or alcohol abuse.              Glasgow Coma Scale Score: 15           Patient History:     No past medical history on file.    No past surgical history on file.    No family history on file.    Social History     Tobacco Use    Smoking status: Not on file    Smokeless tobacco: Not on  file   Substance Use Topics    Alcohol use: Not on file    Drug use: Not on file      Review of Systems:     Review of Systems   Constitutional:  Negative for chills and fever.   HENT:  Negative for ear pain and sore throat.    Eyes:  Negative for pain and visual disturbance.   Respiratory:  Negative for cough and shortness of breath.    Cardiovascular:  Positive for leg swelling. Negative for chest pain and palpitations.   Gastrointestinal:  Positive for abdominal pain, blood in stool, nausea and vomiting.   Genitourinary:  Positive for flank pain. Negative for dysuria and hematuria.   Musculoskeletal:  Positive for neck pain. Negative for arthralgias and back pain.   Skin:  Negative for color change and rash.   Neurological:  Negative for seizures and syncope.   All other systems reviewed and are negative.     Physical Exam:     ED Triage Vitals [12/23/21 2004]   Temp Heart Rate Resp BP   36.7 C (98  F) 80 15 (!) 181/108     SpO2 Temp Source Heart Rate Source Patient Position   100 % Oral Monitor Sitting     BP Location FiO2 (%)     Left arm --       Physical Exam  Vitals and nursing note reviewed.   Constitutional:       General: She is not in acute distress.     Appearance: She is well-developed. She is ill-appearing (chronically).   HENT:      Head: Normocephalic and atraumatic.      Mouth/Throat:      Dentition: Abnormal dentition.   Eyes:      Conjunctiva/sclera: Conjunctivae normal.   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      Pulses:           Radial pulses are 2+ on the right side and 2+ on the left side.        Dorsalis pedis pulses are 2+ on the right side and 2+ on the left side.      Heart sounds: Murmur (patient reports history of VSD) heard.      Arteriovenous access: Right arteriovenous access is present.     Comments: Good pulse/thrill right Bicep AV fistula with overlying pseudoaneursym    Pulmonary:      Effort: Pulmonary effort is normal. No respiratory distress.      Breath sounds: Normal breath sounds.   Abdominal:      General: Abdomen is protuberant.      Palpations: Abdomen is soft.      Tenderness: There is no abdominal tenderness. There is no guarding or rebound.   Musculoskeletal:         General: No swelling.      Cervical back: Neck supple.      Right lower leg: Edema present.      Left lower leg: Edema present.   Skin:     General: Skin is warm and dry.      Capillary Refill: Capillary refill takes less than 2 seconds.   Neurological:      Mental Status: She is alert.   Psychiatric:         Mood and Affect: Mood normal.      ED Course & MDM     ED Course as of 12/24/21 0351   Thu Dec 23, 2021   2034 MSE initiated in triage.  34 year old female with history of lupus, ESRD on dialysis and SVC syndrome presents to the ED complaining of abdominal pain, nausea, vomiting, and leg swelling over the past 2 days.  Patient reports pain across her entire abdomen and both flanks.  She reports that she has missed multiple dialysis sessions and has not been dialyzed since last Wednesday.  She does report small amounts of blood in her emesis yesterday but none today.  She endorses a fall yesterday in which she hit her head but did not lose consciousness.  She denies being on anticoagulation.  On examination, patient's abdomen is soft and nontender.  She has distended neck veins due to her history of SVC syndrome but her airway is patent and there is no stridor or increased work of breathing.  Labs, EKG, and Phenergan ordered in triage. [DE]   Fri Dec 24, 2021   0224 [place] IV placed at bedside   [KW]  [id number] X-ray Chest 1 View  FINDINGS:  Mild cardiomegaly. Borderline vascular congestion without overt edema. Interval  placement of graft material in the brachiocephalic/SVC    IMPRESSION:  Borderline vascular congestion [KW]   0228 ECG 12 lead  EKG my interpretation sinus rhythm with a rate of 78.  Prolonged  PR interval consistent with first-degree AV block.  Normal QTc interval.  No ST segment changes concerning for acute ischemia.  Mild peaking of T waves in V2 and V3 but no previous for comparison. [KW]   0232 Potassium(!!): 6.4  HyperK treatment ordered [KW]   0245 Spoke with nephrologist Dr. Selinda Orion who will place patient on dialysis schedule.  [KW]   [id number] Glucose: bgl- 35 mg/dl  Patient asymptomatic given glucose tablet [KW]     ED Course User Index  [DE] Sheran Lawless, MD  [KW] Doreen Salvage, DO       Clinical Impressions as of 12/24/21 0351   ESRD on hemodialysis (CMS/HCC) (HCC) (CMS/HCC)   Hyperkalemia   Hypoglycemia   SVC syndrome   Primary hypertension     Vital Signs  Temp Source: Oral (12/24/2021  1:40 AM)  Heart Rate: 78 (12/24/2021  1:40 AM)  Heart Rate Source: Monitor (12/24/2021  1:40 AM)  Resp: 17 (12/24/2021  1:40 AM)  Resp Rate Source: Monitor (12/24/2021  1:40 AM)  BP: (!) 191/121 (12/24/2021  1:40 AM)  BP MAP: 144 (12/24/2021  1:40 AM)  BP Location: Left arm (12/24/2021  1:40 AM)  BP Method: Automatic (12/24/2021  1:40 AM)  Patient Position: Sitting (12/24/2021  1:40 AM)  Level of Consciousness-NURSES ONLY: Alert (12/24/2021  1:41 AM)  Patient Refused Vitals?: No (12/24/2021  1:40 AM)    Medical Decision Making  Patient is a 89 year old female with history and presentation above.  Patient's presentation with multiple complaints.  Will evaluate with differential diagnosis include but not limited to fluid overload secondary to end-stage renal disease, gastroenteritis, gastritis, pancreatitis, pyelonephritis, urinary tract infection, musculoskeletal pain, nephrolithiasis.  Appropriate labs and imaging ordered.  Labs are consistent with end-stage renal disease with hyperkalemia noted 6.4.  Mild peaking of T waves but no recent for comparison.  Patient given hyperkalemia protocol.  Patient also severely hypertensive also likely secondary to missed dialysis and unable to tolerate p.o. to take blood pressure  medications nephrology consulted and will dialysis patient.  Patient does have distended jugular veins likely secondary to fluid overload as well as increasing peripheral edema is likely sick of the same.  No respiratory distress noted.  Chest x-ray does show mild pulmonary edema.  Patient also noted to have hypoglycemia that was treated with glucose tablets and food.  Patient minimally symptomatic at those times.  Hypoglycemia likely secondary to poor p.o. intake in the setting of persistent nausea and vomiting.  Given patient's presentation will admit for further work-up and management of her symptoms.    Amount and/or Complexity of Data Reviewed  Labs: ordered. Decision-making details documented in ED Course.  Radiology: ordered. Decision-making details documented in ED Course.  ECG/medicine tests:  Decision-making details documented in ED Course.    Risk  Prescription drug management.  Decision regarding hospitalization.          Doreen Salvage, DO  Resident  12/24/21 [place], DO  Resident  12/24/21 [place], DO  Resident  12/24/21 [id number]    Electronically signed by Renold Genta, MD at 12/29/2021  7:03 AM  EDT    Associated attestation - Renold Genta, MD - 12/29/2021  7:03 AM EDT  Formatting of this note might be different from the original.  ED Attending Attestation    This patient was examined, evaluated, and dispositioned by the resident physician. I physically examined and interviewed the patient. I discussed the evaluation, workup, and treatment plan of the patient directly with the resident physician and agree with associated documentation, plan, and disposition.

## (undated) NOTE — Progress Notes (Signed)
Formatting of this note might be different from the original.  Discharge Note    SW spoke with patient and informed her that she is discharging. Patient asked when would she be discharging; SW informed her once nurse provides discharge paperwork. Patient states that she has not eaten since she arrived to the hospital and she would like a new doctor. SW informed her that a new doctor will probably not be provided as she is up for discharge but SW will ask nursing about receiving a meal.   Electronically signed by Ledora Bottcher, Social Worker II at 12/24/2021  2:44 PM EDT

## (undated) NOTE — ED Notes (Signed)
Formatting of this note might be different from the original.  Pt to nurses station demands iv be removed stating this is a racist hospital because you all dont have no black doctor I can see now. Pt iv removed cath intact pt dresses and walks out refusing further vital signs  Electronically signed by Roxanne Mins, RN at 07/01/2022 11:19 PM EDT

## (undated) NOTE — ED Notes (Signed)
Formatting of this note might be different from the original.  Pt eating snacks despite request by the RN to avoid PO intake while she is still vomiting.  Electronically signed by Daleen Squibb, RN at 07/01/2022  4:39 PM EDT

---

## 2017-09-18 ENCOUNTER — Emergency Department
Admission: EM | Admit: 2017-09-18 | Discharge: 2017-09-19 | Disposition: A | Discharge status: Home / Self Care | Payer: Medicaid Other | Attending: Emergency Medicine | Admitting: Emergency Medicine

## 2017-09-18 ENCOUNTER — Emergency Department: Payer: Medicaid Other

## 2017-09-18 ENCOUNTER — Encounter: Payer: Self-pay | Admitting: Emergency Medicine

## 2017-09-18 DIAGNOSIS — M7918 Myalgia, other site: Secondary | ICD-10-CM | POA: Insufficient documentation

## 2017-09-18 DIAGNOSIS — R51 Headache: Secondary | ICD-10-CM | POA: Insufficient documentation

## 2017-09-18 DIAGNOSIS — R079 Chest pain, unspecified: Secondary | ICD-10-CM | POA: Diagnosis not present

## 2017-09-18 DIAGNOSIS — R112 Nausea with vomiting, unspecified: Secondary | ICD-10-CM | POA: Diagnosis not present

## 2017-09-18 DIAGNOSIS — M791 Myalgia, unspecified site: Secondary | ICD-10-CM

## 2017-09-18 DIAGNOSIS — R52 Pain, unspecified: Secondary | ICD-10-CM

## 2017-09-18 DIAGNOSIS — R110 Nausea: Secondary | ICD-10-CM

## 2017-09-18 HISTORY — DX: Systemic lupus erythematosus, unspecified: M32.9

## 2017-09-18 HISTORY — DX: Unspecified kidney failure: N19

## 2017-09-18 HISTORY — DX: Reserved for concepts with insufficient information to code with codable children: IMO0002

## 2017-09-18 NOTE — ED Notes (Signed)
MD wants urine specimen asap. Asked pt if she had urinated yet and pt states "No I will let you know when I do. Have they decided what they are going to do?" Notified pt that I put in a request for the IV team so we could obtain blood and give medicine and that a urine specimen is needed asap. Pt asked if the doctor had ordered the meds yet. Told pt yes but I could not give them yet as she does not have an IV and no urine has been given yet. Waiting for access.

## 2017-09-18 NOTE — ED Notes (Signed)
Lights out per pt request.

## 2017-09-18 NOTE — ED Triage Notes (Signed)
Pt reports has lupus and is on dialysis. Had dialysis today.

## 2017-09-18 NOTE — ED Notes (Signed)
Pt to xray

## 2017-09-18 NOTE — ED Triage Notes (Signed)
Pt reports pain over her entire body and nausea and vomiting since yesterday.

## 2017-09-18 NOTE — ED Notes (Signed)
Pt to the er c/o headache, pain in arms bilaterally, pain in legs bilaterally, pain in abdomen, chest pain, nausea, vomiting. Pt went to dialysis today and had the usual procedure done. Pt stated she told staff at dialysis about pain but they suggesste dnothing. Pt has a flat affect. Shunt is on the right side.

## 2017-09-18 NOTE — ED Notes (Signed)
Unable to obtain IV on pt. Pt states she doesn't want her AC attempted as the vein rolls. Right arm cannot be used due to shunt. Will have IV team consult. Pt is laying in bed on her side. No distress noted. VSS. Pt happy at this time.

## 2017-09-19 ENCOUNTER — Emergency Department: Payer: Medicaid Other

## 2017-09-19 ENCOUNTER — Other Ambulatory Visit: Payer: Self-pay

## 2017-09-19 LAB — CK: Total CK: 59 U/L (ref 38–234)

## 2017-09-19 LAB — COMPREHENSIVE METABOLIC PANEL
ALK PHOS: 141 U/L — AB (ref 38–126)
ALT: 13 U/L (ref 0–44)
ANION GAP: 14 (ref 5–15)
AST: 19 U/L (ref 15–41)
Albumin: 3.6 g/dL (ref 3.5–5.0)
BUN: 36 mg/dL — ABNORMAL HIGH (ref 6–20)
CO2: 24 mmol/L (ref 22–32)
Calcium: 8 mg/dL — ABNORMAL LOW (ref 8.9–10.3)
Chloride: 101 mmol/L (ref 98–111)
Creatinine, Ser: 4.73 mg/dL — ABNORMAL HIGH (ref 0.44–1.00)
GFR, EST AFRICAN AMERICAN: 13 mL/min — AB (ref >60)
GFR, EST NON AFRICAN AMERICAN: 12 mL/min — AB (ref >60)
Glucose, Bld: 79 mg/dL (ref 70–99)
Potassium: 3.5 mmol/L (ref 3.5–5.1)
Sodium: 139 mmol/L (ref 135–145)
Total Bilirubin: 0.6 mg/dL (ref 0.3–1.2)
Total Protein: 7.4 g/dL (ref 6.5–8.1)

## 2017-09-19 LAB — CBC
HEMATOCRIT: 36.2 % (ref 35.0–47.0)
HEMOGLOBIN: 11.8 g/dL — AB (ref 12.0–16.0)
MCH: 31.8 pg (ref 26.0–34.0)
MCHC: 32.6 g/dL (ref 32.0–36.0)
MCV: 97.6 fL (ref 80.0–100.0)
Platelets: 193 10*3/uL (ref 150–440)
RBC: 3.71 MIL/uL — ABNORMAL LOW (ref 3.80–5.20)
RDW: 18.9 % — AB (ref 11.5–14.5)
WBC: 3.6 10*3/uL (ref 3.6–11.0)

## 2017-09-19 LAB — LIPASE, BLOOD: LIPASE: 45 U/L (ref 11–51)

## 2017-09-19 LAB — POCT PREGNANCY, URINE: PREG TEST UR: NEGATIVE

## 2017-09-19 MED ORDER — HYDROMORPHONE HCL 1 MG/ML IJ SOLN
INTRAMUSCULAR | Status: AC
  Filled 2017-09-19: qty 1

## 2017-09-19 MED ORDER — HYDROMORPHONE HCL 1 MG/ML IJ SOLN
1.0000 mg | Freq: Once | INTRAMUSCULAR | Status: AC
  Administered 2017-09-19: 1 mg via INTRAVENOUS

## 2017-09-19 MED ORDER — PROMETHAZINE HCL 25 MG PO TABS
ORAL_TABLET | ORAL | Status: AC
  Filled 2017-09-19: qty 1

## 2017-09-19 MED ORDER — PROMETHAZINE HCL 25 MG PO TABS
25.0000 mg | ORAL_TABLET | Freq: Once | ORAL | Status: AC
  Administered 2017-09-19: 25 mg via ORAL

## 2017-09-19 MED ORDER — TRAMADOL HCL 50 MG PO TABS
100.0000 mg | ORAL_TABLET | Freq: Once | ORAL | Status: AC
  Administered 2017-09-19: 100 mg via ORAL
  Filled 2017-09-19: qty 2

## 2017-09-19 NOTE — ED Notes (Signed)
Pt to ct 

## 2017-09-19 NOTE — ED Notes (Signed)
Pt. Verbalizes understanding of d/c instructions, medications, and follow-up. VS stable.  Pt. In NAD at time of d/c and denies further concerns regarding this visit. Pt. Stable at the time of departure from the unit, departing unit by the safest and most appropriate manner per that pt condition and limitations with all belongings accounted for. Pt advised to return to the ED at any time for emergent concerns, or for new/worsening symptoms.   

## 2017-09-19 NOTE — ED Provider Notes (Signed)
I assumed care of the patient from Dr. Dwaine Deter at midnight.  Recommendation to follow-up with laboratory data and CT and if unchanged without any acute findings patient stable for discharge home.  IV team was unable to obtain access however laboratory testing was able to be performed.  Laboratory data unremarkable and appears to be baseline for the patient.  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  Patient received IM Dilaudid and Phenergan as patient states that this is what normally works for her pain and nausea.  I spoke with the patient at length regarding the necessity of following up with [facility] which he plans to do   Gregor Hams, MD 09/19/17 [phone]

## 2017-09-19 NOTE — ED Notes (Signed)
This Rn in with ER MD. Pt states she has been hurting since yesterday. IV team unsuccessful. MD asks if this is reoccurring and pt states no.

## 2017-09-19 NOTE — ED Notes (Signed)
Patient is resting comfortably at this time with no signs of distress present. Equal, unlabored rise and fall of chest noted within normal rate. VS stable. Will continue to monitor.   

## 2017-09-19 NOTE — ED Notes (Signed)
Pt given meds and blood sent to lab. Ct called. Pt has had no episodes of emesis yet and VSS.

## 2017-09-19 NOTE — ED Provider Notes (Signed)
[facility] Emergency Department Provider Note   ____________________________________________   First MD Initiated Contact with Patient 09/18/17 2233     (approximate)  I have reviewed the triage vital signs and the nursing notes.   HISTORY  Chief Complaint Abdominal Pain; Leg Pain; Back Pain; Headache; Emesis; and Nausea    HPI Evelyn Bryant is a 32 y.o. female history of end-stage renal disease and lupus  Patient also relates a history of a recent hematoma in her chest after a catheter removal.  Also had pneumonia about 3 weeks ago treated with antibiotics including doxycycline at either [facility] or [facility].  Patient not quite certain.  Patient reports she had dialysis today before she left [place], she traveled to [place] to visit a friend.  She reports throughout the entire day she is been experiencing achiness, joint discomfort, mild frontal throbbing headache, nausea, and vomiting at home but not any longer, discomfort in the muscles of her legs, her back and some achiness of her shoulders.  Patient reports she has had similar symptoms about 3 to 4 weeks ago, also been seen at [place] for similar.  She is compliant with her dialysis and had this done in [place] today.  No shortness of breath.  Denies chest pain.  She reports that her back and flank feel achy.  She reports the symptoms happen many times in the past and she is not sure why.  Patient reports only thing that seems to make her symptoms better are Dilaudid and Phenergan.  Denies pain or burning with urination.  Denies fevers.  No chest pain or cough.  Denies pregnancy, reports she is never been sexually active.    Past Medical History:  Diagnosis Date  . Kidney failure   . Lupus ([place])     There are no active problems to display for this patient.   History reviewed. No pertinent surgical history.  Prior to Admission medications   Not on File     Allergies Acetaminophen; Codeine; Enalapril; Fentanyl; Hydrocodone; Nsaids; Oxycodone; and Sulfur  No family history on file.  Social History Social History   Tobacco Use  . Smoking status: Not on file  Substance Use Topics  . Alcohol use: Not on file  . Drug use: Not on file    Review of Systems Constitutional: No fever/chills but some fatigue and achy all over Eyes: No visual changes. ENT: No sore throat. Cardiovascular: Denies chest pain. Respiratory: Denies shortness of breath. Gastrointestinal: No abdominal pain.  No diarrhea.  No constipation. Genitourinary: Negative for dysuria. Musculoskeletal: Negative for back pain except her flanks both feel sore. Skin: Negative for rash. Neurological: Negative for  focal weakness or numbness.    ____________________________________________   PHYSICAL EXAM:  VITAL SIGNS: ED Triage Vitals  Enc Vitals Group     BP 09/18/17 2144 (!) 136/107     Pulse Rate 09/18/17 2144 91     Resp 09/18/17 2144 16     Temp 09/18/17 2144 98 F (36.7 C)     Temp Source 09/18/17 2144 Oral     SpO2 09/18/17 2144 97 %     Weight 09/18/17 2140 152 lb 1.9 oz (69 kg)     Height 09/18/17 2140 5\' 6"  (1.676 m)     Head Circumference --      Peak Flow --      Pain Score 09/18/17 2140 10     Pain Loc --      Pain Edu? --  Excl. in [place]? --     Constitutional: Alert and oriented. Well appearing and in no acute distress.  She is pleasant.  Does not appear to be in any pain or distress, resting comfortably watching television. Eyes: Conjunctivae are normal. Head: Atraumatic. Nose: No congestion/rhinnorhea. Mouth/Throat: Mucous membranes are moist. Neck: No stridor.   Cardiovascular: Normal rate, regular rhythm.  Strong machinelike systolic murmur.  Good peripheral circulation.  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 Respiratory: Normal respiratory effort.  No retractions. Lungs  CTAB. Gastrointestinal: Soft and nontender. No distention. Musculoskeletal: No lower extremity tenderness nor edema.  Right upper extremity fistula with strong thrill. Neurologic:  Normal speech and language. No gross focal neurologic deficits are appreciated.  Skin:  Skin is warm, dry and intact. No rash noted. Psychiatric: Mood and affect are normal. Speech and behavior are normal.  ____________________________________________   LABS (all labs ordered are listed, but only abnormal results are displayed)  Labs Reviewed  CBC  CK  URINALYSIS, COMPLETE (UACMP) WITH MICROSCOPIC  COMPREHENSIVE METABOLIC PANEL  LIPASE, BLOOD  POCT PREGNANCY, URINE  POC URINE PREG, ED   ____________________________________________  EKG   ____________________________________________  RADIOLOGY  CT chest pending at time of signout ____________________________________________   PROCEDURES  Procedure(s) performed: None  Procedures  Critical Care performed: No  ____________________________________________   INITIAL IMPRESSION / ASSESSMENT AND PLAN / ED COURSE  Pertinent labs & imaging results that were available during my care of the patient were reviewed by me and considered in my medical decision making (see chart for details).  Patient presents for evaluation for multiple symptoms, reports muscle aches, arthralgias, headache, nausea, vomiting.  Of note, she does not report any focal tenderness on exam.  She does report nausea and vomiting but is not a vomiting in the emergency department.  She is afebrile stable hemodynamics.  Does not appear to have any respiratory distress, but has a very complicated medical history including what appears to be a previous chest hematoma, also treatment for pneumonia with doxycycline recently, end-stage renal disease and lupus.  She appears very stable and nontoxic, but given her symptoms and her recent work-up will proceed by checking lab work, checking  electrolytes, urinalysis, and also a CT of the chest to further evaluate for any evidence of pneumonia.  Should she have an ongoing pneumonia on CT scan, I would suggest is likely healthcare associated given her recent treatment with doxycycline.  I have ordered pain medicine, difficult IV access awaiting vascular access team.  Ongoing care assigned to Dr. Owens Shark to follow-up on lab work, CT of the chest.  If lab work and CAT scan are reassuring without acute, likely discharge to follow-up given her stability if lab work and ongoing evaluation are reassuring.  However if the patient does have pneumonia, would consider treatment for healthcare associated pneumonia or further complication denoted on CT or lab work patient may need admission.      ____________________________________________   FINAL CLINICAL IMPRESSION(S) / ED DIAGNOSES  Final diagnoses:  Myalgia  Nausea  Pain of multiple sites      NEW MEDICATIONS STARTED DURING THIS VISIT:  New Prescriptions   No medications on file     Note:  This document was prepared using Dragon voice recognition software and may include unintentional dictation errors.     Delman Kitten, MD 09/19/17 [phone]

## 2017-09-23 ENCOUNTER — Emergency Department (HOSPITAL_COMMUNITY): Payer: Medicaid Other

## 2017-09-23 ENCOUNTER — Other Ambulatory Visit: Payer: Self-pay

## 2017-09-23 ENCOUNTER — Emergency Department (HOSPITAL_COMMUNITY)
Admission: EM | Admit: 2017-09-23 | Discharge: 2017-09-23 | Disposition: A | Discharge status: Home / Self Care | Payer: Medicaid Other | Attending: Emergency Medicine | Admitting: Emergency Medicine

## 2017-09-23 ENCOUNTER — Encounter (HOSPITAL_COMMUNITY): Payer: Self-pay | Admitting: Emergency Medicine

## 2017-09-23 DIAGNOSIS — Z79899 Other long term (current) drug therapy: Secondary | ICD-10-CM | POA: Insufficient documentation

## 2017-09-23 DIAGNOSIS — R1084 Generalized abdominal pain: Secondary | ICD-10-CM | POA: Diagnosis present

## 2017-09-23 DIAGNOSIS — N83201 Unspecified ovarian cyst, right side: Secondary | ICD-10-CM | POA: Diagnosis not present

## 2017-09-23 LAB — COMPREHENSIVE METABOLIC PANEL
ALK PHOS: 128 U/L — AB (ref 38–126)
ALT: 11 U/L (ref 0–44)
ANION GAP: 16 — AB (ref 5–15)
AST: 35 U/L (ref 15–41)
Albumin: 3.6 g/dL (ref 3.5–5.0)
BILIRUBIN TOTAL: 1 mg/dL (ref 0.3–1.2)
BUN: 55 mg/dL — ABNORMAL HIGH (ref 6–20)
CALCIUM: 8.1 mg/dL — AB (ref 8.9–10.3)
CO2: 23 mmol/L (ref 22–32)
CREATININE: 5.73 mg/dL — AB (ref 0.44–1.00)
Chloride: 101 mmol/L (ref 98–111)
GFR calc non Af Amer: 9 mL/min — ABNORMAL LOW (ref >60)
GFR, EST AFRICAN AMERICAN: 11 mL/min — AB (ref >60)
GLUCOSE: 77 mg/dL (ref 70–99)
Potassium: 4.7 mmol/L (ref 3.5–5.1)
Sodium: 140 mmol/L (ref 135–145)
TOTAL PROTEIN: 7.4 g/dL (ref 6.5–8.1)

## 2017-09-23 LAB — CBC WITH DIFFERENTIAL/PLATELET
Basophils Absolute: 0 10*3/uL (ref 0.0–0.1)
Basophils Relative: 1 %
Eosinophils Absolute: 0.2 10*3/uL (ref 0.0–0.7)
Eosinophils Relative: 4 %
HEMATOCRIT: 37.7 % (ref 36.0–46.0)
HEMOGLOBIN: 12.1 g/dL (ref 12.0–15.0)
LYMPHS ABS: 1.5 10*3/uL (ref 0.7–4.0)
LYMPHS PCT: 31 %
MCH: 30.7 pg (ref 26.0–34.0)
MCHC: 32.1 g/dL (ref 30.0–36.0)
MCV: 95.7 fL (ref 78.0–100.0)
MONOS PCT: 8 %
Monocytes Absolute: 0.4 10*3/uL (ref 0.1–1.0)
NEUTROS ABS: 2.7 10*3/uL (ref 1.7–7.7)
NEUTROS PCT: 56 %
Platelets: 227 10*3/uL (ref 150–400)
RBC: 3.94 MIL/uL (ref 3.87–5.11)
RDW: 17.6 % — ABNORMAL HIGH (ref 11.5–15.5)
WBC: 4.8 10*3/uL (ref 4.0–10.5)

## 2017-09-23 LAB — I-STAT CG4 LACTIC ACID, ED
Lactic Acid, Venous: 0.73 mmol/L (ref 0.5–1.9)
Lactic Acid, Venous: 0.6 mmol/L (ref 0.5–1.9)

## 2017-09-23 LAB — LIPASE, BLOOD: Lipase: 52 U/L — ABNORMAL HIGH (ref 11–51)

## 2017-09-23 LAB — MAGNESIUM: Magnesium: 2.4 mg/dL (ref 1.7–2.4)

## 2017-09-23 MED ORDER — HYDROMORPHONE HCL 1 MG/ML IJ SOLN
1.0000 mg | Freq: Once | INTRAMUSCULAR | Status: DC
  Filled 2017-09-23: qty 1

## 2017-09-23 MED ORDER — HYDROMORPHONE HCL 1 MG/ML IJ SOLN
1.0000 mg | Freq: Once | INTRAMUSCULAR | Status: AC
  Administered 2017-09-23: 1 mg via INTRAVENOUS
  Filled 2017-09-23: qty 1

## 2017-09-23 MED ORDER — PROMETHAZINE HCL 25 MG PO TABS: 25.0000 mg | ORAL_TABLET | Freq: Four times a day (QID) | ORAL | 0 refills | Status: DC | PRN

## 2017-09-23 MED ORDER — PROMETHAZINE HCL 25 MG/ML IJ SOLN
12.5000 mg | Freq: Once | INTRAMUSCULAR | Status: AC
  Administered 2017-09-23: 12.5 mg via INTRAVENOUS
  Filled 2017-09-23: qty 1

## 2017-09-23 MED ORDER — HYDROMORPHONE HCL 2 MG PO TABS: 1.0000 mg | ORAL_TABLET | ORAL | 0 refills | Status: DC | PRN

## 2017-09-23 MED ORDER — IOPAMIDOL (ISOVUE-300) INJECTION 61%: 30.0000 mL | Freq: Once | INTRAVENOUS | Status: DC | PRN

## 2017-09-23 NOTE — ED Provider Notes (Signed)
Pt signed out from Dr. Ellender Hose pending CT results.  This showed:  IMPRESSION: 1. Complex right adnexal mass located posterior to the uterus toward the right. This mass measures 4.8 x 4.5 x 3.7 cm. Question hemorrhagic cyst arising from the right ovary. Differential considerations include endometrioma or ovarian neoplasm. Correlation with pelvic ultrasound may be advisable in this regard.  2. Incomplete visualization of mass at right cardiophrenic angle, fully described on recent CT of the chest. Suspect lymph node prominence in this area. Note that there is no appreciable adenopathy below the diaphragm.  3. No evident bowel obstruction. No abscess in the abdomen pelvis. No ascites. Appendix appears normal.  4.  No renal or ureteral calculus.  No hydronephrosis.  5.  Skin and subcutaneous edema, also noted on recent chest CT.  6.  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   So, a pelvic US was ordered which shows:  IMPRESSION: 1. There is a mass in the right adnexa. I believe this is the right ovary with a rim of normal tissue and a centrally located masslike region replacing much of the right ovary. There is no blood flow in the masslike region. I suspect these findings likely represent a large hemorrhagic cyst replacing much of the right ovary. A neoplasm is not completely excluded on this study. Recommend a follow-up ultrasound in 6-12 weeks to ensure resolution. 2. There is a small cyst in the uterus. It is unclear whether this is in the sub endometrial myometrium such as a degenerated fibroid or arising from the endometrium itself. Recommend attention on follow-up.  Pt has never seen obgyn, so she's given the number to the women's clinic to f/u.  She knows she is supposed to have a repeat US to ensure resolution of cyst.  Return if worse.   Isla Pence, MD 09/23/17 [phone]

## 2017-09-23 NOTE — ED Triage Notes (Signed)
Patient is complaining of pain all over. Patient states her dialysis is Monday, Wednesday, Friday.

## 2017-09-23 NOTE — Discharge Instructions (Signed)
It is very important that you follow up with OBGyn to have a pelvic US in 6-12 weeks to check to make sure your cyst goes away.

## 2017-09-23 NOTE — ED Provider Notes (Signed)
[facility] DEPT Provider Note   CSN: [id number] Arrival date & time: 09/23/17  0036     History   Chief Complaint Chief Complaint  Patient presents with  . Lupus    HPI Evelyn Bryant is a 32 y.o. female.  HPI 32 year old female with history of lupus and subsequent end-stage renal disease here with abdominal pain and diffuse body aches.  The patient states her primary complaint is a diffuse, cramp-like, generalized abdominal pain with associated nausea, vomiting, and diarrhea.  She denies any fevers.  She is been having difficulty eating and drinking over the last 24 hours.  She states that over the last 24 hours, she is also developed diffuse body aches.  She describes it as just "pain all over."  She has a history of chronic pain from her lupus, but states this is worse than usual.  Denies any cramping.  She denies any known sick contacts, though she does go to dialysis regularly.  She states she is at her dry weight.  She does note that her blood pressure has been slightly less than usual over the last 24 hours, and she is been unable to eat or drink.  She continues to produce a small amount of urine and denies any dysuria.  No other complaints.  Past Medical History:  Diagnosis Date  . Kidney failure   . Lupus ([place])     There are no active problems to display for this patient.   History reviewed. No pertinent surgical history.   OB History   None      Home Medications    Prior to Admission medications   Medication Sig Start Date End Date Taking? Authorizing Provider  amLODipine (NORVASC) 10 MG tablet Take 10 mg by mouth daily. 04/12/17  Yes [provider]  calcium acetate (PHOSLO) 667 MG capsule Take 667 mg by mouth 3 (three) times daily with meals.   Yes [provider]  HYDROmorphone (DILAUDID) 2 MG tablet Take 0.5 tablets (1 mg total) by mouth every 4 (four) hours as needed for severe pain. 09/23/17   Isla Pence,  MD  promethazine (PHENERGAN) 25 MG tablet Take 1 tablet (25 mg total) by mouth every 6 (six) hours as needed for nausea or vomiting. 09/23/17   Isla Pence, MD    Family History History reviewed. No pertinent family history.  Social History Social History   Tobacco Use  . Smoking status: Never Smoker  . Smokeless tobacco: Never Used  Substance Use Topics  . Alcohol use: Never    Frequency: Never  . Drug use: Never     Allergies   Acetaminophen; Codeine; Enalapril; Fentanyl; Hydrocodone; Oxycodone; Sulfa antibiotics; and Nsaids   Review of Systems Review of Systems  Constitutional: Positive for fatigue. Negative for chills and fever.  HENT: Negative for congestion and rhinorrhea.   Eyes: Negative for visual disturbance.  Respiratory: Negative for cough, shortness of breath and wheezing.   Cardiovascular: Negative for chest pain and leg swelling.  Gastrointestinal: Positive for abdominal pain, diarrhea, nausea and vomiting.  Genitourinary: Negative for dysuria and flank pain.  Musculoskeletal: Positive for arthralgias and myalgias. Negative for neck pain and neck stiffness.  Skin: Negative for rash and wound.  Allergic/Immunologic: Negative for immunocompromised state.  Neurological: Positive for weakness. Negative for syncope and headaches.  All other systems reviewed and are negative.    Physical Exam Updated Vital Signs BP 111/73 (BP Location: Left Arm)   Pulse 90   Temp 98.7  F (37.1 C) (Oral)   Resp 13   Ht 5\' 6"  (1.676 m)   Wt 68.9 kg (152 lb)   LMP 08/24/2017   SpO2 100%   BMI 24.53 kg/m   Physical Exam  Constitutional: She is oriented to person, place, and time. She appears well-developed and well-nourished. No distress.  HENT:  Head: Normocephalic and atraumatic.  Eyes: Conjunctivae are normal.  Neck: Neck supple.  Cardiovascular: Normal rate, regular rhythm and normal heart sounds. Exam reveals no friction rub.  No murmur  heard. Pulmonary/Chest: Effort normal and breath sounds normal. No respiratory distress. She has no wheezes. She has no rales.  Abdominal: Soft. She exhibits no distension. There is tenderness (Generalized, no rebound or guarding). There is guarding. There is no rebound.  Musculoskeletal: She exhibits no edema.  Right AV fistula intact, palpable thrill  Neurological: She is alert and oriented to person, place, and time. She exhibits normal muscle tone.  Skin: Skin is warm. Capillary refill takes less than 2 seconds.  Psychiatric: She has a normal mood and affect.  Nursing note and vitals reviewed.    ED Treatments / Results  Labs (all labs ordered are listed, but only abnormal results are displayed) Labs Reviewed  CBC WITH DIFFERENTIAL/PLATELET - Abnormal; Notable for the following components:      Result Value   RDW 17.6 (*)    All other components within normal limits  COMPREHENSIVE METABOLIC PANEL - Abnormal; Notable for the following components:   BUN 55 (*)    Creatinine, Ser 5.73 (*)    Calcium 8.1 (*)    Alkaline Phosphatase 128 (*)    GFR calc non Af Amer 9 (*)    GFR calc Af Amer 11 (*)    Anion gap 16 (*)    All other components within normal limits  LIPASE, BLOOD - Abnormal; Notable for the following components:   Lipase 52 (*)    All other components within normal limits  MAGNESIUM  I-STAT CG4 LACTIC ACID, ED  I-STAT CG4 LACTIC ACID, ED    EKG None  Radiology Ct Abdomen Pelvis Wo Contrast  Result Date: 09/23/2017 CLINICAL DATA:  Pain.  Renal failure. EXAM: CT ABDOMEN AND PELVIS WITHOUT CONTRAST TECHNIQUE: Multidetector CT imaging of the abdomen and pelvis was performed following the standard protocol without oral or IV contrast. COMPARISON:  Chest CT including lung bases September 19, 2017 FINDINGS: Lower chest: Currently there is incomplete visualization of a mass at the right cardiophrenic angle level, fully characterized on recent CT. There is atelectatic change in  the right middle lobe. Lung bases are otherwise clear. Hepatobiliary: No focal liver lesions are evident on this noncontrast enhanced study. Gallbladder wall is not appreciably thickened. There is no biliary duct dilatation. Pancreas: There is no pancreatic mass or inflammatory focus. Spleen: No splenic lesions are evident on this noncontrast enhanced study. Adrenals/Urinary Tract: Adrenals bilaterally appear normal. Kidneys are rather small bilaterally. There is no renal mass or hydronephrosis on either side. There is no renal or ureteral calculus on either side. Urinary bladder is midline with wall thickness within normal limits for degree of urinary bladder decompression. Stomach/Bowel: There is no appreciable bowel wall or mesenteric thickening. No evident bowel obstruction. No free air or portal venous air. Vascular/Lymphatic: There is no abdominal aortic aneurysm. No vascular lesion evident on this noncontrast enhanced study. No adenopathy is appreciable in the abdomen or pelvis. Reproductive: Uterus is anteverted. There is a complex mass measuring 4.8 x 4.5 x  3.7 cm located posteriorly and slightly to the right of the uterus, likely arising posteriorly from the right adnexa. This mass contains both cystic and noncystic components. No other pelvic mass is evident. Other: Appendix appears unremarkable. No abscess or ascites is evident in the abdomen or pelvis. There is extensive subcutaneous edema throughout the right breast region, similar to recent CT examination. There is skin in the abdominal wall with prominent vessels in this area, similar to recent CT examination of the chest and upper abdomen. 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 No lytic or destructive bone lesions evident. No intramuscular lesions are appreciable. IMPRESSION: 1. Complex right adnexal mass located posterior to the uterus toward the right. This mass measures 4.8 x 4.5 x 3.7 cm.  Question hemorrhagic cyst arising from the right ovary. Differential considerations include endometrioma or ovarian neoplasm. Correlation with pelvic ultrasound may be advisable in this regard. 2. Incomplete visualization of mass at right cardiophrenic angle, fully described on recent CT of the chest. Suspect lymph node prominence in this area. Note that there is no appreciable adenopathy below the diaphragm. 3. No evident bowel obstruction. No abscess in the abdomen pelvis. No ascites. Appendix appears normal. 4.  No renal or ureteral calculus.  No hydronephrosis. 5.  Skin and subcutaneous edema, also noted on recent chest CT. 6.  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 Electronically Signed   By: Lowella Grip III M.D.   On: 09/23/2017 08:02   US Transvaginal Non-ob  Result Date: 09/23/2017 CLINICAL DATA:  Pain. EXAM: TRANSABDOMINAL AND TRANSVAGINAL ULTRASOUND OF PELVIS DOPPLER ULTRASOUND OF OVARIES TECHNIQUE: Both transabdominal and transvaginal ultrasound examinations of the pelvis were performed. Transabdominal technique was performed for global imaging of the pelvis including uterus, ovaries, adnexal regions, and pelvic cul-de-sac. It was necessary to proceed with endovaginal exam following the transabdominal exam to visualize the endometrium and ovaries. Color and duplex Doppler ultrasound was utilized to evaluate blood flow to the ovaries. COMPARISON:  CT scan September 23, 2017 FINDINGS: Uterus Measurements: 7.4 x 4.3 x 4.8 cm. No fibroids or other mass visualized. Endometrium Thickness: 7.8 mm. There is a small cyst measuring 4 x 3 x 4 mm either within the endometrium or subendometrial myometrium. This is in the lower uterine segment. Right ovary Measurements: 5.1 x 4.2 x 4.9 cm. There is a complex mass in the right adnexa measuring 4.6 x 4.2 x 4.1 cm, likely ovarian in origin. There is no blood flow in the mass but there is arterial and venous blood flow in the rim of peripheral tissue  thought to be ovarian. Left ovary Measurements: 3.8 x 2.4 x 2.7 cm. Contains a dominant follicle measuring 1.6 cm. Pulsed Doppler evaluation of both ovaries demonstrates normal low-resistance arterial and venous waveforms. Other findings Trace fluid in the pelvis is likely physiologic. IMPRESSION: 1. There is a mass in the right adnexa. I believe this is the right ovary with a rim of normal tissue and a centrally located masslike region replacing much of the right ovary. There is no blood flow in the masslike region. I suspect these findings likely represent a large hemorrhagic cyst replacing much of the right ovary. A neoplasm is not completely excluded on this study. Recommend a follow-up ultrasound in 6-12 weeks to ensure resolution. 2. There is a small cyst in the uterus. It is unclear whether this is in the sub endometrial myometrium such as a degenerated fibroid or arising from the endometrium itself. Recommend attention on  follow-up. Electronically Signed   By: Dorise Bullion III M.D   On: 09/23/2017 09:33   US Pelvis Complete  Result Date: 09/23/2017 CLINICAL DATA:  Pain. EXAM: TRANSABDOMINAL AND TRANSVAGINAL ULTRASOUND OF PELVIS DOPPLER ULTRASOUND OF OVARIES TECHNIQUE: Both transabdominal and transvaginal ultrasound examinations of the pelvis were performed. Transabdominal technique was performed for global imaging of the pelvis including uterus, ovaries, adnexal regions, and pelvic cul-de-sac. It was necessary to proceed with endovaginal exam following the transabdominal exam to visualize the endometrium and ovaries. Color and duplex Doppler ultrasound was utilized to evaluate blood flow to the ovaries. COMPARISON:  CT scan September 23, 2017 FINDINGS: Uterus Measurements: 7.4 x 4.3 x 4.8 cm. No fibroids or other mass visualized. Endometrium Thickness: 7.8 mm. There is a small cyst measuring 4 x 3 x 4 mm either within the endometrium or subendometrial myometrium. This is in the lower uterine segment. Right  ovary Measurements: 5.1 x 4.2 x 4.9 cm. There is a complex mass in the right adnexa measuring 4.6 x 4.2 x 4.1 cm, likely ovarian in origin. There is no blood flow in the mass but there is arterial and venous blood flow in the rim of peripheral tissue thought to be ovarian. Left ovary Measurements: 3.8 x 2.4 x 2.7 cm. Contains a dominant follicle measuring 1.6 cm. Pulsed Doppler evaluation of both ovaries demonstrates normal low-resistance arterial and venous waveforms. Other findings Trace fluid in the pelvis is likely physiologic. IMPRESSION: 1. There is a mass in the right adnexa. I believe this is the right ovary with a rim of normal tissue and a centrally located masslike region replacing much of the right ovary. There is no blood flow in the masslike region. I suspect these findings likely represent a large hemorrhagic cyst replacing much of the right ovary. A neoplasm is not completely excluded on this study. Recommend a follow-up ultrasound in 6-12 weeks to ensure resolution. 2. There is a small cyst in the uterus. It is unclear whether this is in the sub endometrial myometrium such as a degenerated fibroid or arising from the endometrium itself. Recommend attention on follow-up. Electronically Signed   By: Dorise Bullion III M.D   On: 09/23/2017 09:33   [place] Art/ven Flow Abd Pelv Doppler  Result Date: 09/23/2017 CLINICAL DATA:  Pain. EXAM: TRANSABDOMINAL AND TRANSVAGINAL ULTRASOUND OF PELVIS DOPPLER ULTRASOUND OF OVARIES TECHNIQUE: Both transabdominal and transvaginal ultrasound examinations of the pelvis were performed. Transabdominal technique was performed for global imaging of the pelvis including uterus, ovaries, adnexal regions, and pelvic cul-de-sac. It was necessary to proceed with endovaginal exam following the transabdominal exam to visualize the endometrium and ovaries. Color and duplex Doppler ultrasound was utilized to evaluate blood flow to the ovaries. COMPARISON:  CT scan September 23, 2017  FINDINGS: Uterus Measurements: 7.4 x 4.3 x 4.8 cm. No fibroids or other mass visualized. Endometrium Thickness: 7.8 mm. There is a small cyst measuring 4 x 3 x 4 mm either within the endometrium or subendometrial myometrium. This is in the lower uterine segment. Right ovary Measurements: 5.1 x 4.2 x 4.9 cm. There is a complex mass in the right adnexa measuring 4.6 x 4.2 x 4.1 cm, likely ovarian in origin. There is no blood flow in the mass but there is arterial and venous blood flow in the rim of peripheral tissue thought to be ovarian. Left ovary Measurements: 3.8 x 2.4 x 2.7 cm. Contains a dominant follicle measuring 1.6 cm. Pulsed Doppler evaluation of both ovaries demonstrates normal low-resistance  arterial and venous waveforms. Other findings Trace fluid in the pelvis is likely physiologic. IMPRESSION: 1. There is a mass in the right adnexa. I believe this is the right ovary with a rim of normal tissue and a centrally located masslike region replacing much of the right ovary. There is no blood flow in the masslike region. I suspect these findings likely represent a large hemorrhagic cyst replacing much of the right ovary. A neoplasm is not completely excluded on this study. Recommend a follow-up ultrasound in 6-12 weeks to ensure resolution. 2. There is a small cyst in the uterus. It is unclear whether this is in the sub endometrial myometrium such as a degenerated fibroid or arising from the endometrium itself. Recommend attention on follow-up. Electronically Signed   By: Dorise Bullion III M.D   On: 09/23/2017 09:33   Dg Abdomen Acute W/chest  Result Date: 09/23/2017 CLINICAL DATA:  32 y/o F; right-sided abdominal pain with nausea, vomiting, and diarrhea. EXAM: DG ABDOMEN ACUTE W/ 1V CHEST COMPARISON:  09/19/2017 CT chest.  09/18/2017 chest radiograph. FINDINGS: There is no evidence of dilated bowel loops or free intraperitoneal air. No radiopaque calculi or other significant radiographic abnormality is  seen. Heart size and mediastinal contours are within normal limits. Mediastinal and right hilar fullness corresponding to adenopathy on prior CT of the chest is stable. Stable linear opacities in the right mid lung zone compatible with atelectasis/scarring. No new consolidation. Surgical clips project over the right breast. IMPRESSION: Negative abdominal radiographs. Stable mediastinal and right hilar fullness better characterized on prior CT of chest. Electronically Signed   By: Kristine Garbe M.D.   On: 09/23/2017 04:52    Procedures Procedures (including critical care time)  Medications Ordered in ED Medications  iopamidol (ISOVUE-300) 61 % injection 30 mL (has no administration in time range)  HYDROmorphone (DILAUDID) injection 1 mg (1 mg Intravenous Not Given 09/23/17 0849)  HYDROmorphone (DILAUDID) injection 1 mg (1 mg Intravenous Given 09/23/17 0551)  promethazine (PHENERGAN) injection 12.5 mg (12.5 mg Intravenous Given 09/23/17 0551)  promethazine (PHENERGAN) injection 12.5 mg (12.5 mg Intravenous Given 09/23/17 0847)  HYDROmorphone (DILAUDID) injection 1 mg (1 mg Intravenous Given 09/23/17 0854)     Initial Impression / Assessment and Plan / ED Course  I have reviewed the triage vital signs and the nursing notes.  Pertinent labs & imaging results that were available during my care of the patient were reviewed by me and considered in my medical decision making (see chart for details).     32 year old female with history of lupus and subsequent end-stage renal disease here with nausea, vomiting, diarrhea, and diffuse abdominal pain.  Vital signs are stable and patient is overall well-appearing on exam.  She has minimal diffuse tenderness but no overt peritonitis.  Initial lab work is very reassuring with normal white count.  Normal lactic acid.  Electrolytes and LFTs are within normal limits.  Given her diffuse tenderness, will obtain CT scan.  Suspect possible viral GI illness, versus  foodborne illness, versus other nonemergent pathology.  Patient care transferred to Dr. Gilford Raid at the end of my shift. Patient presentation, ED course, and plan of care discussed with review of all pertinent labs and imaging. Please see his/her note for further details regarding further ED course and disposition.   Final Clinical Impressions(s) / ED Diagnoses        Duffy Bruce, MD 09/23/17 [phone]

## 2017-09-23 NOTE — ED Notes (Signed)
Pt observed to be sleeping soundly upon entering room. Pt reports 8/10 pain and is requesting more pain and nausea medication

## 2017-09-27 NOTE — ED Notes (Signed)
Formatting of this note might be different from the original.  Pt refusing treatment. Does not want to provide urine specimen. States she is not going to sit here and hurt and be nauseated. Informed pt that we have zofran ordered for her nausea. Pt refuses zofran. States she needs dilaudid, morphine, and phenergan. Explained to pt that those medications will not be provided at this time. Pt states she is leaving. Refuses to sign AMA paper. Ambulatory without any difficulty.  Electronically signed by Orvil Feil, RN at 09/27/2017  3:55 AM EDT

## 2017-09-27 NOTE — ED Triage Notes (Signed)
Formatting of this note might be different from the original.  Patient presents ambulatory to ED c/o headache, n/v, and flank pain X 3 days. Patient reports having dialysis on Monday.   Electronically signed by Wilburn Mylar, RN at 09/27/2017  3:31 AM EDT

## 2017-09-27 NOTE — ED Provider Notes (Signed)
Formatting of this note is different from the original.  [facility] Department     Chief Complaint   Patient presents with   ? Vomiting   ? Flank Pain     The patient presents to this ER about 2 hours after leaving [facility] with similar complaints.  She complains of bilateral flank pain and vomiting.   No chest pain, sob, bleeding.   She states dilaudid and phenergan will help her.       Allergies: Ace inhibitors; Codeine; Fentanyl; Hydrocodone; Oxycodone; Sulfa (sulfonamide antibiotics); and Nsaids (non-steroidal anti-inflammatory drug)    Current Meds:   Current Facility-Administered Medications   Medication Dose Route Frequency Provider Last Rate Last Dose   ? ondansetron (ZOFRAN) injection 4 mg  4 mg Intravenous ED ONCE Octavio Manns, MD         Current Outpatient Medications   Medication Sig Dispense Refill   ? amLODIPine (NORVASC) 10 mg Oral Tablet Take 10 mg by mouth daily.     ? calcium acetate (PHOSLO) 667 mg Oral Capsule Take 667 mg by mouth Three Times a Day With Meals.       Past Medical History:   Diagnosis Date   ? GERD (gastroesophageal reflux disease)    ? Hypertension    ? Kidney failure    ? Lupus (CMS/HCC)      No past surgical history on file.    Social History     Tobacco Use   ? Smoking status: Never Smoker   ? Smokeless tobacco: Never Used   Substance Use Topics   ? Alcohol use: Not Currently     Social History     Substance and Sexual Activity   Drug Use Not Currently     Review of Systems   Constitutional: Negative for fever.   Respiratory: Negative for shortness of breath.    Cardiovascular: Negative for chest pain.   Gastrointestinal: Positive for abdominal pain, nausea and vomiting. Negative for blood in stool and diarrhea.   Genitourinary: Positive for flank pain. Negative for dysuria.   Musculoskeletal: Positive for back pain.   Neurological: Negative for dizziness, syncope, weakness, light-headedness and headaches.     Recent VS:  Patient Vitals for the past 24  hrs:   BP Temp Pulse Resp SpO2 Weight   09/27/17 0321 123/75 97.9 F (36.6 C) 89 20 100 % 165 lb 14.4 oz (75.3 kg)     I performed a physical exam    Patient height not recorded    Physical Exam   Constitutional: She is oriented to person, place, and time. No distress.   HENT:   Head: Atraumatic.   No stridor or bleeding.   Eyes: Pupils are equal, round, and reactive to light. Conjunctivae and EOM are normal. Right eye exhibits no discharge. Left eye exhibits no discharge.   Neck: Normal range of motion and full passive range of motion without pain. Neck supple. No JVD present. No Brudzinski's sign and no Kernig's sign noted.   Cardiovascular: Normal rate, regular rhythm and normal heart sounds.   Pulmonary/Chest: Effort normal and breath sounds normal. No accessory muscle usage or stridor. No tachypnea. No respiratory distress. She has no wheezes. She has no rales. She exhibits no tenderness.   Abdominal: Soft. Bowel sounds are normal. She exhibits no distension and no mass. There is tenderness in the periumbilical area. There is no rigidity, no rebound, no guarding, no CVA tenderness, no tenderness at McBurney's point and negative Murphy's  sign.   Musculoskeletal: Normal range of motion. She exhibits no edema, tenderness or deformity.   Neurological: She is alert and oriented to person, place, and time. She is not disoriented. She displays no tremor. No cranial nerve deficit or sensory deficit. She exhibits normal muscle tone. She displays no seizure activity. Coordination and gait normal. GCS eye subscore is 4. GCS verbal subscore is 5. GCS motor subscore is 6.   Skin: Skin is warm and dry. Capillary refill takes less than 2 seconds. No rash noted. She is not diaphoretic. No erythema. No pallor.   Psychiatric: She has a normal mood and affect. Her behavior is normal.     No results found for this visit on 09/27/17 (from the past 24 hour(s)).    EKG (Interpretation if ordered):     MEDICAL DECISION  MAKING/RESULTS/PLAN    I have reviewed the past medical, family and social history sections: including the medications, nurses notes, vital signs, and allergies listed in the above record.      SNOMED CT(R)    1. Chronic abdominal pain  CHRONIC ABDOMINAL PAIN    2. Nausea and vomiting, intractability of vomiting not specified, unspecified vomiting type  NAUSEA AND VOMITING    3. Drug-seeking behavior  DRUG SEEKING BEHAVIOR      Emergency Department Course:  Abdomen soft.    When advised workup needed prior to assessing need for medications, patient then refused further intervention and left ama.   I suspect drug seeking behavior.                 Clinical Impressions as of Sep 27 400   Chronic abdominal pain   Nausea and vomiting, intractability of vomiting not specified, unspecified vomiting type   Drug-seeking behavior     ED Provider: Octavio Manns, MD    09/27/2017      Electronically signed by Octavio Manns, MD at 09/27/2017  4:05 AM EDT

## 2018-08-02 IMAGING — US US ART/VEN ABD/PELV/SCROTUM DOPPLER LTD
1 series · 13 of 25 positions shown · non-contrast
Comparison: CT scan September 23, 2017

CLINICAL DATA: Pain.

EXAM:
TRANSABDOMINAL AND TRANSVAGINAL ULTRASOUND OF PELVIS
DOPPLER ULTRASOUND OF OVARIES
TECHNIQUE: Both transabdominal and transvaginal ultrasound examinations of the
pelvis were performed. Transabdominal technique was performed for
global imaging of the pelvis including uterus, ovaries, adnexal
regions, and pelvic cul-de-sac.
It was necessary to proceed with endovaginal exam following the
transabdominal exam to visualize the endometrium and ovaries. Color
and duplex Doppler ultrasound was utilized to evaluate blood flow to
the ovaries.

[Series 1: us art/ven abd/pelv/scrotum doppler ltd · 0.20mm/px · 13 of 77 slices shown]
[im 1/77]
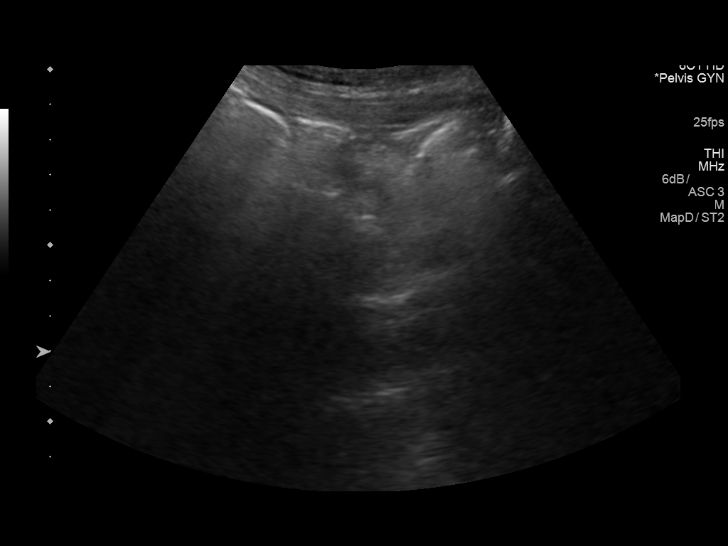
[im 7/77]
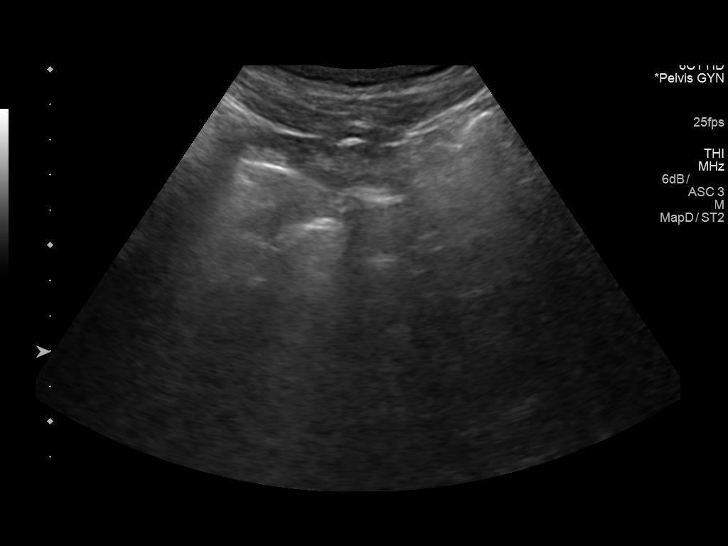
[im 13/77]
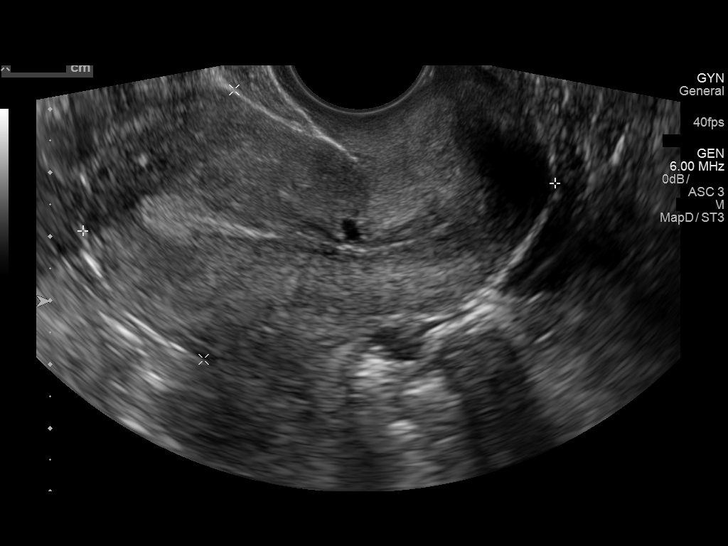
[im 20/77]
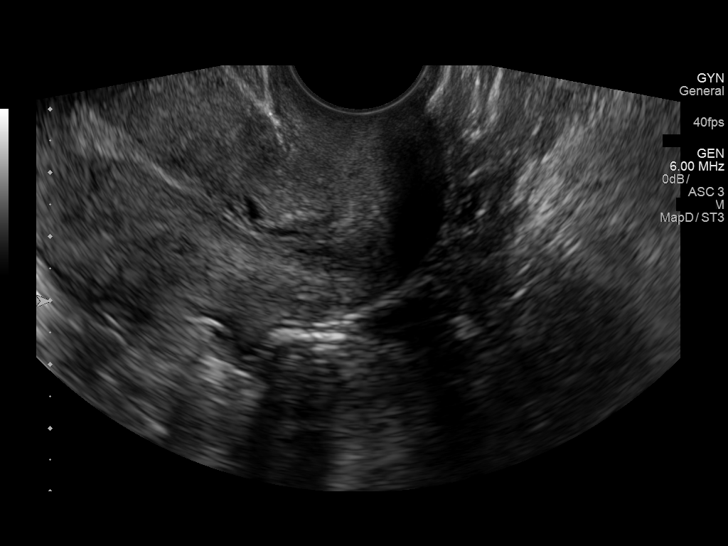
[im 26/77]
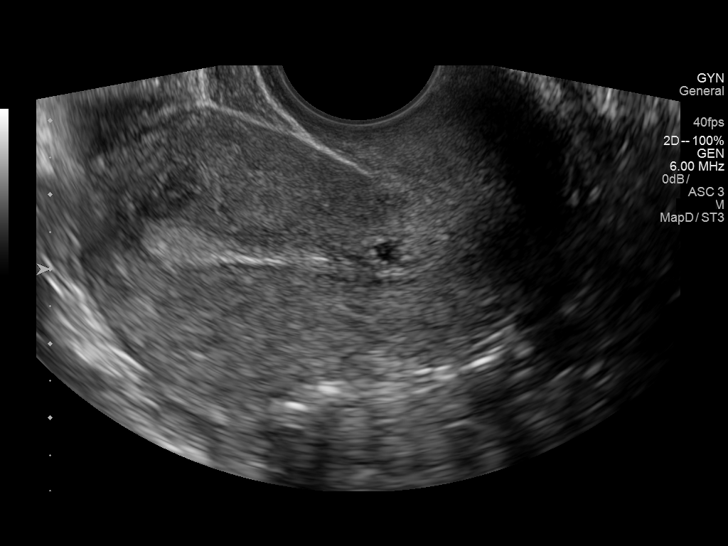
[im 32/77]
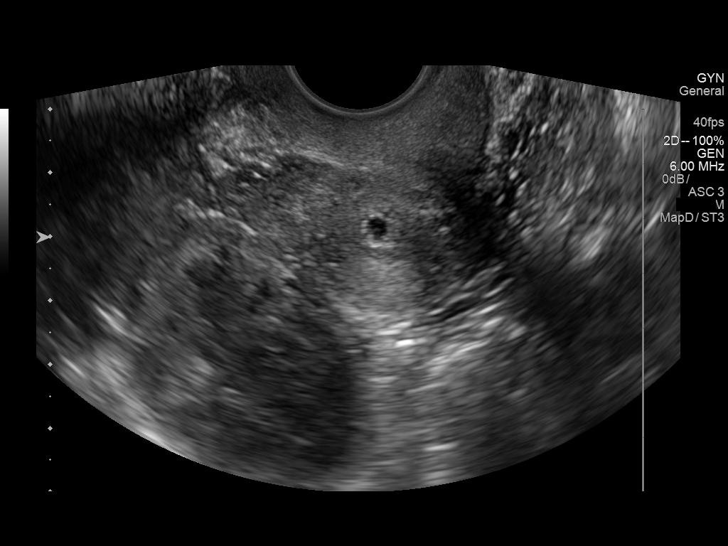
[im 39/77]
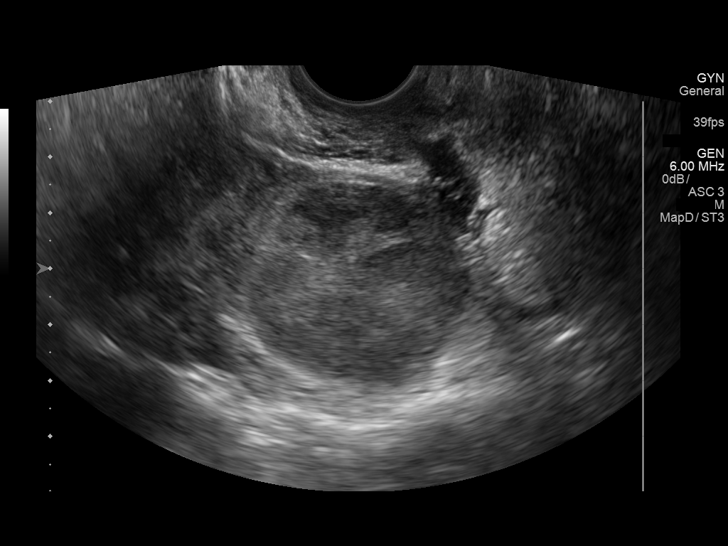
[im 45/77]
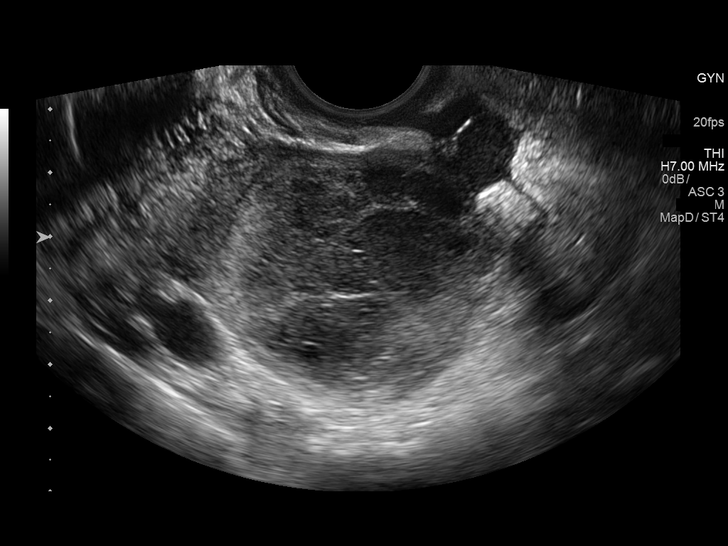
[im 51/77]
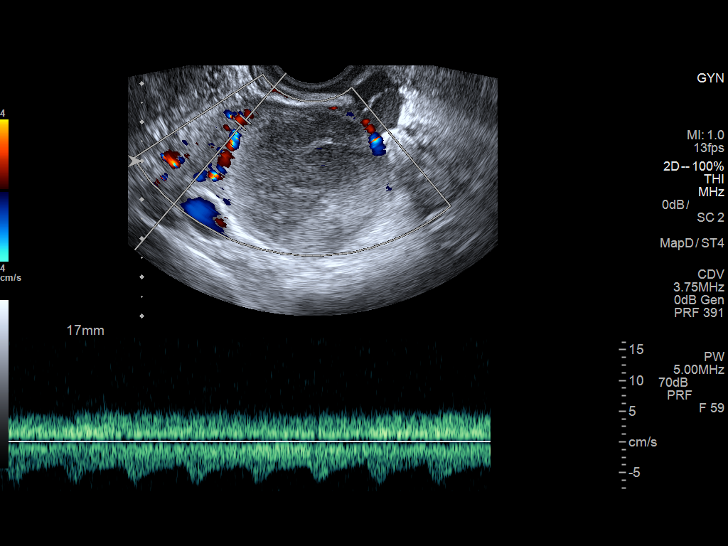
[im 58/77]
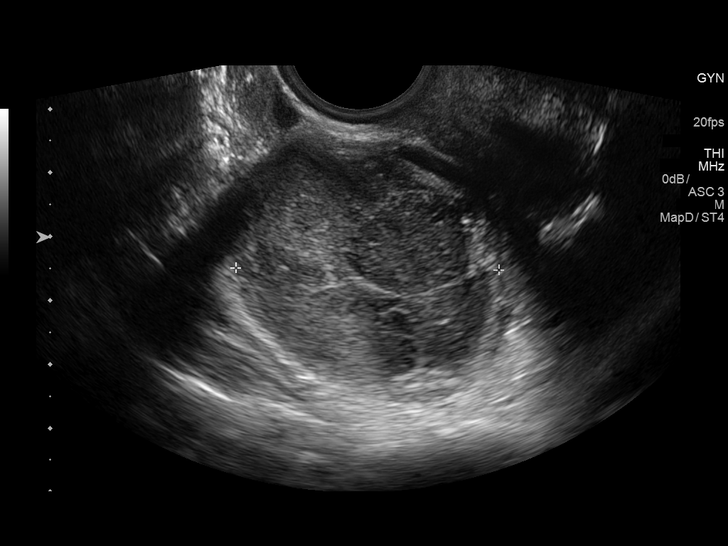
[im 64/77]
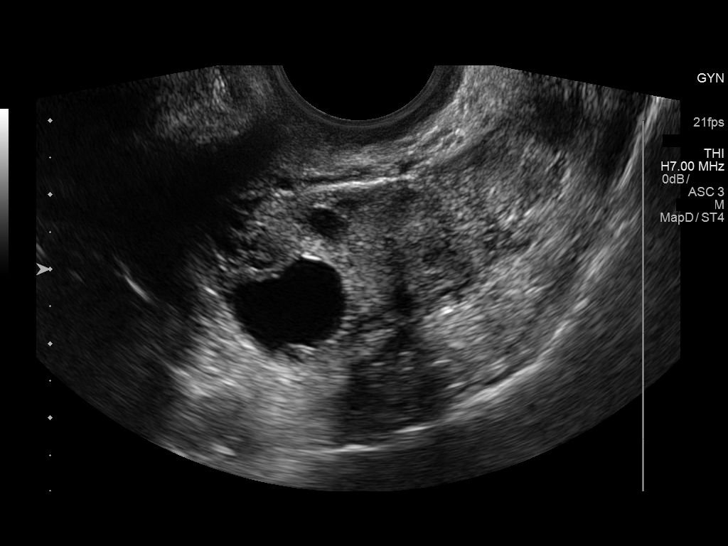
[im 70/77]
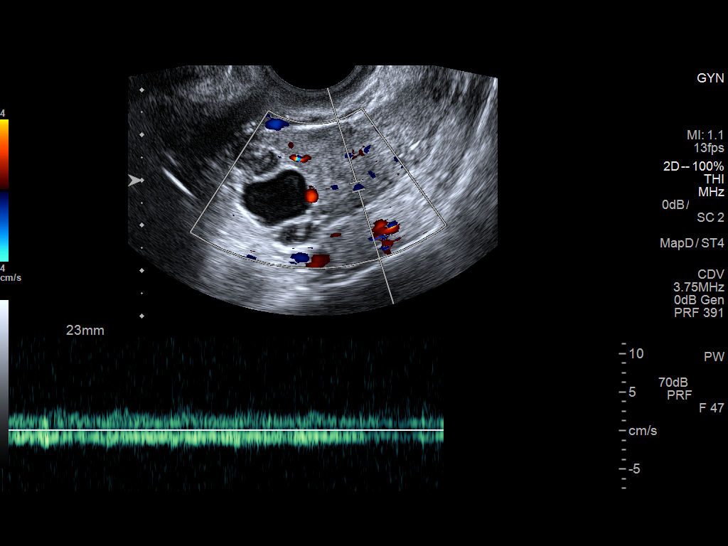
[im 77/77]
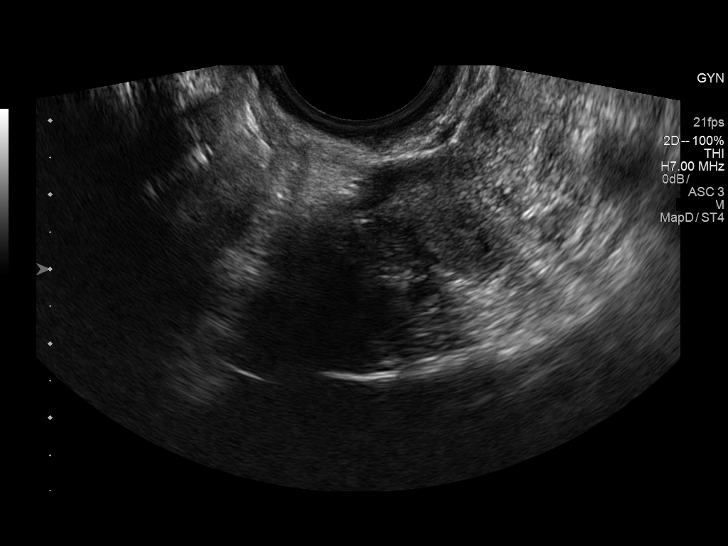

[13 of 25 positions shown; findings below may reference images not displayed]

FINDINGS: Uterus

Measurements: 7.4 x 4.3 x 4.8 cm. No fibroids or other mass
visualized.

Endometrium

Thickness: 7.8 mm. There is a small cyst measuring 4 x 3 x 4 mm
either within the endometrium or subendometrial myometrium. This is
in the lower uterine segment.

Right ovary

Measurements: 5.1 x 4.2 x 4.9 cm. There is a complex mass in the
right adnexa measuring 4.6 x 4.2 x 4.1 cm, likely ovarian in origin.
There is no blood flow in the mass but there is arterial and venous
blood flow in the rim of peripheral tissue thought to be ovarian.

Left ovary

Measurements: 3.8 x 2.4 x 2.7 cm. Contains a dominant follicle
measuring 1.6 cm.

Pulsed Doppler evaluation of both ovaries demonstrates normal
low-resistance arterial and venous waveforms.

Other findings

Trace fluid in the pelvis is likely physiologic.
IMPRESSION: 1. There is a mass in the right adnexa. I believe this is the right
ovary with a rim of normal tissue and a centrally located masslike
region replacing much of the right ovary. There is no blood flow in
the masslike region. I suspect these findings likely represent a
large hemorrhagic cyst replacing much of the right ovary. A neoplasm
is not completely excluded on this study. Recommend a follow-up
ultrasound in 6-12 weeks to ensure resolution.
2. There is a small cyst in the uterus. It is unclear whether this
is in the sub endometrial myometrium such as a degenerated fibroid
or arising from the endometrium itself. Recommend attention on
follow-up.

## 2018-10-26 IMAGING — DX DG ABDOMEN ACUTE W/ 1V CHEST
4 series · 4 of 4 positions shown · non-contrast
Comparison: 09/19/2017 CT chest.  09/18/2017 chest radiograph.

CLINICAL DATA: 31 y/o F; right-sided abdominal pain with nausea,
vomiting, and diarrhea.

EXAM:
DG ABDOMEN ACUTE W/ 1V CHEST

[chest pa]
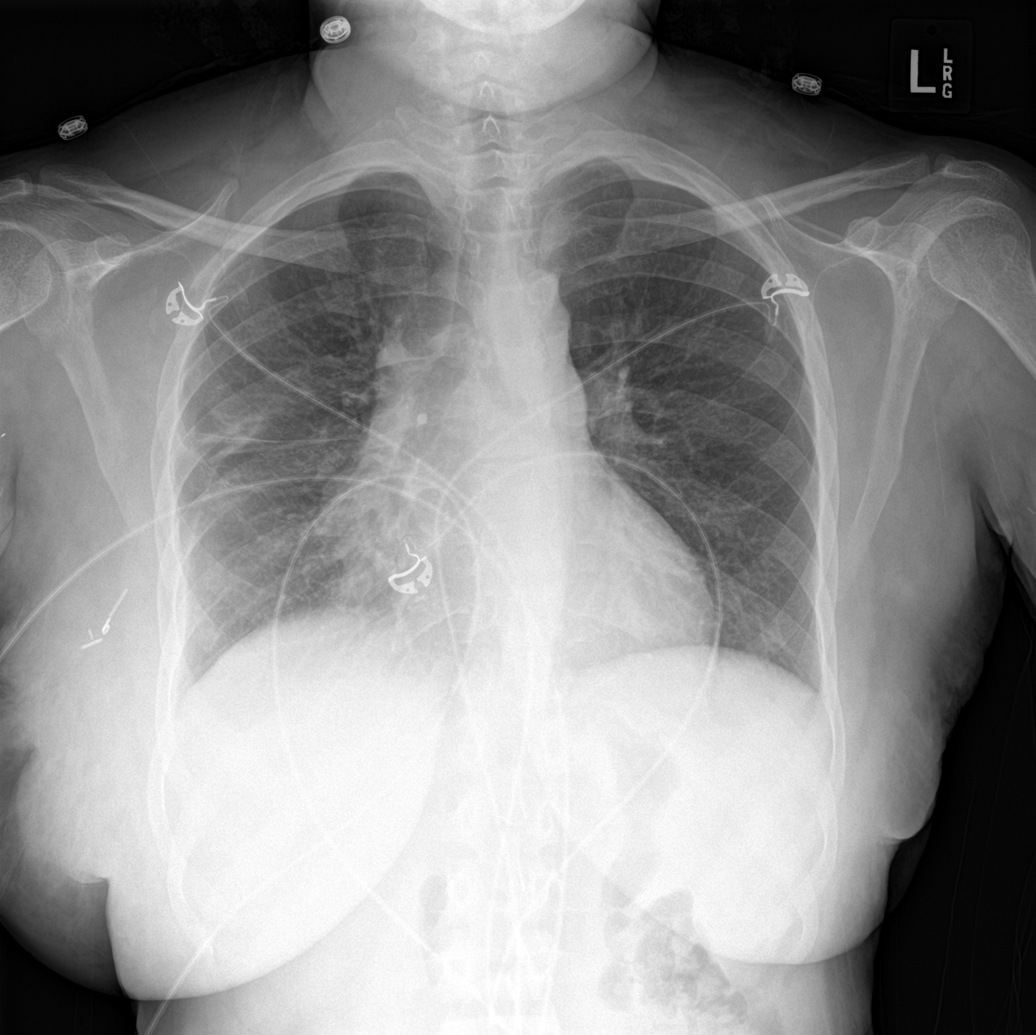

[abdomen erect]
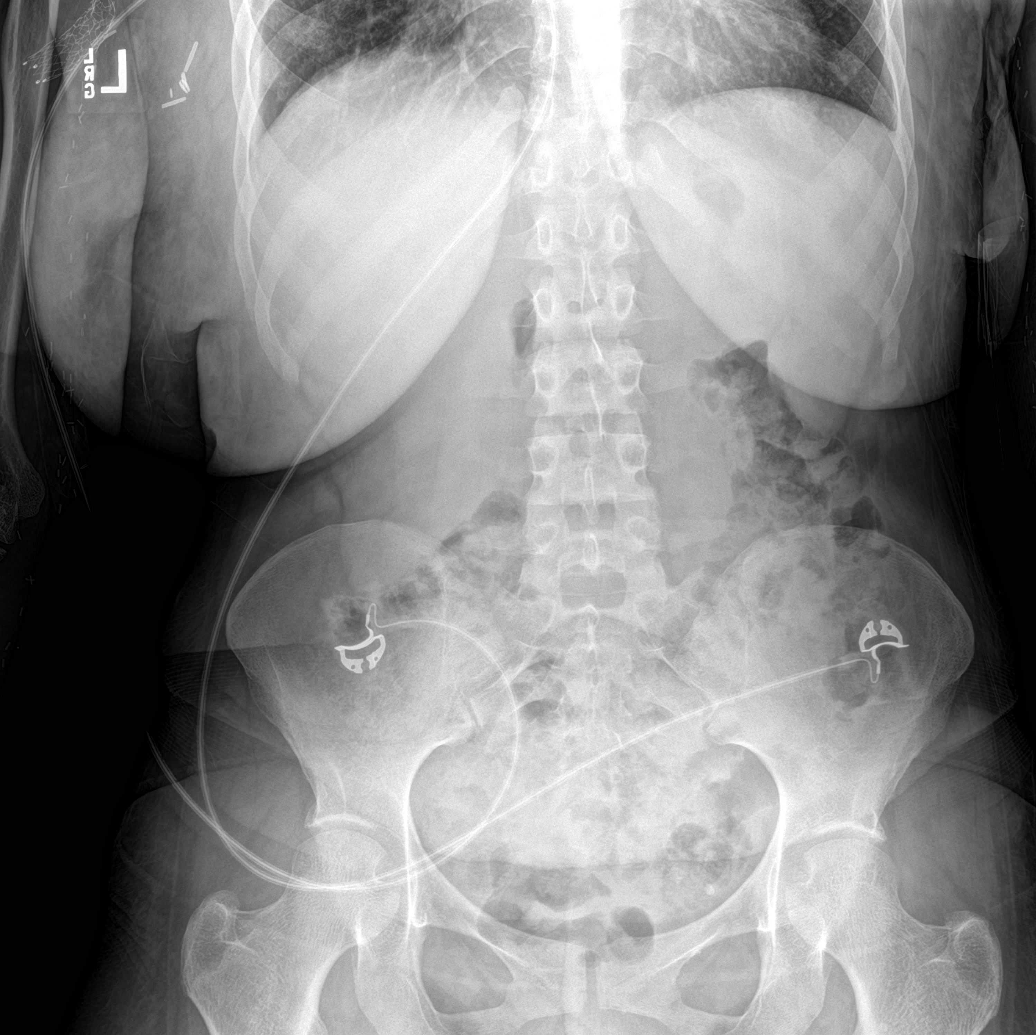

[abdomen supine (1 of 2)]
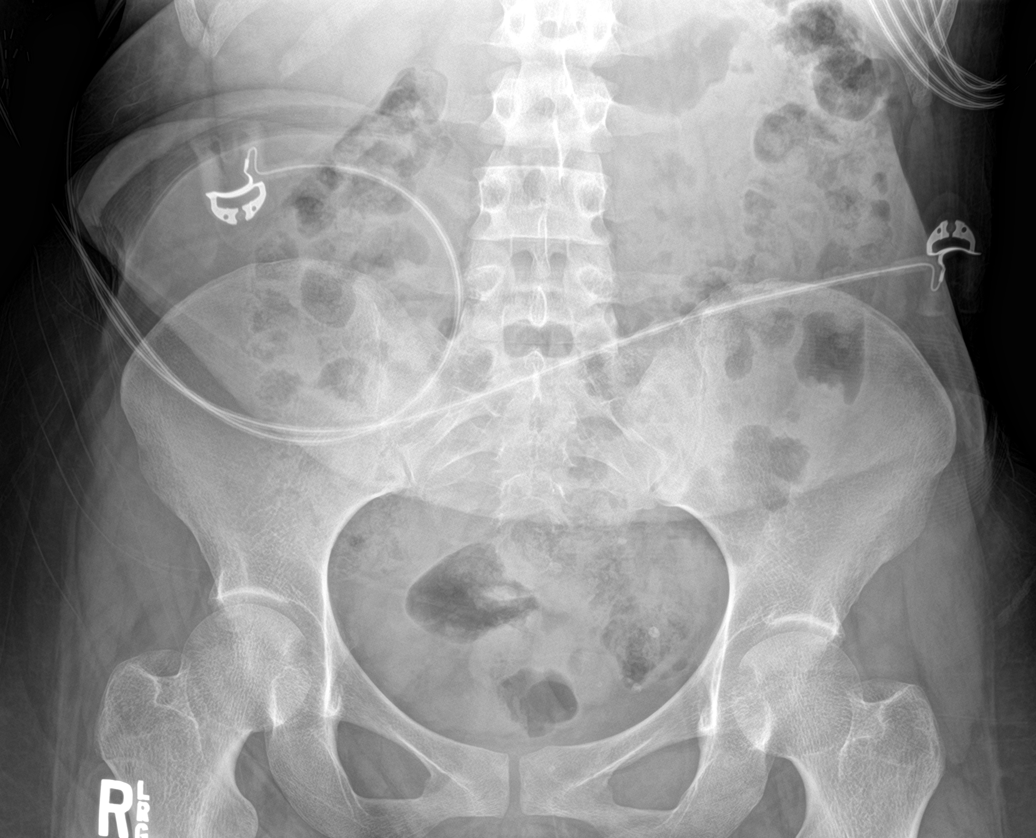

[abdomen supine (2 of 2)]
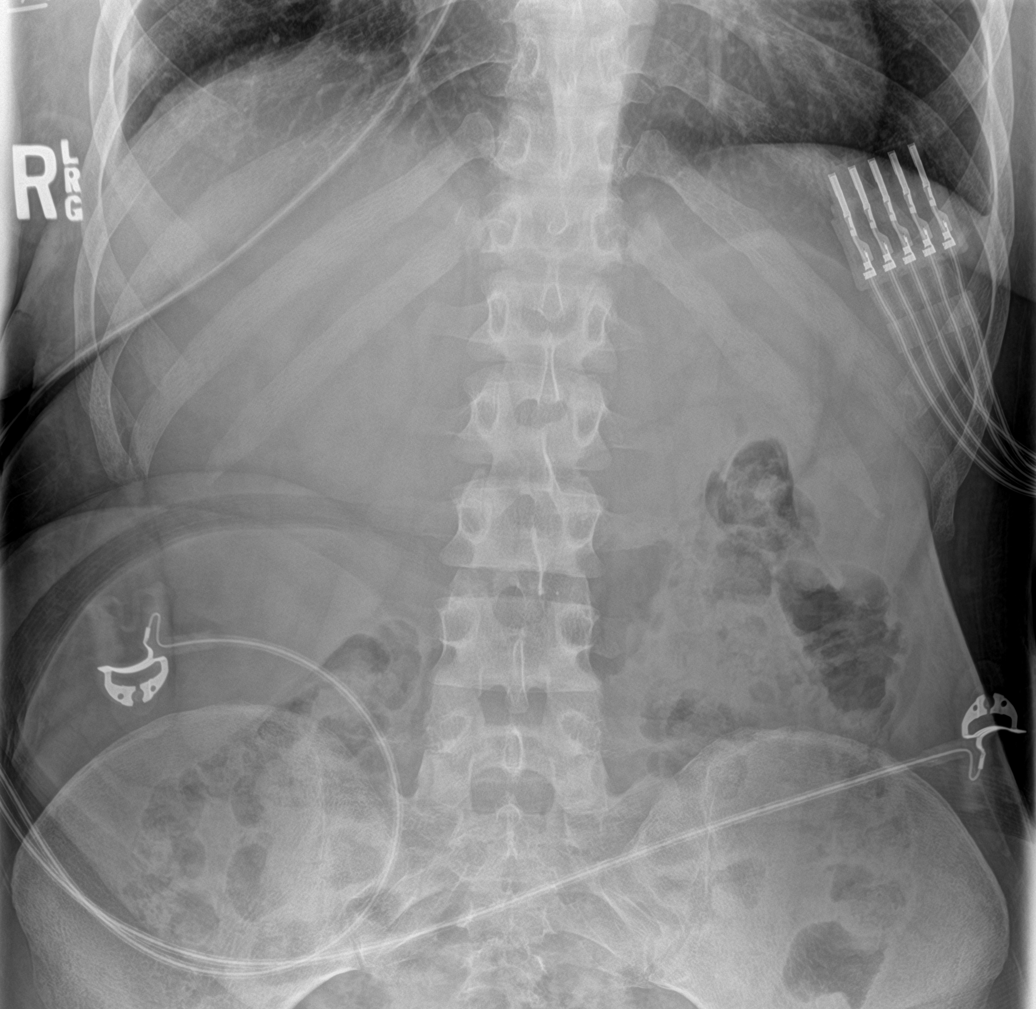

[4 of 4 positions shown; findings below may reference images not displayed]

FINDINGS: There is no evidence of dilated bowel loops or free intraperitoneal
air. No radiopaque calculi or other significant radiographic
abnormality is seen. Heart size and mediastinal contours are within
normal limits.

Mediastinal and right hilar fullness corresponding to adenopathy on
prior CT of the chest is stable. Stable linear opacities in the
right mid lung zone compatible with atelectasis/scarring. No new
consolidation. Surgical clips project over the right breast.
IMPRESSION: Negative abdominal radiographs. Stable mediastinal and right hilar
fullness better characterized on prior CT of chest.

By: Ilionord Carline Tertulien M.D.

## 2019-05-24 ENCOUNTER — Inpatient Hospital Stay
Admit: 2019-05-24 | Discharge: 2019-05-24 | Disposition: A | Discharge status: Home / Self Care | Payer: MEDICAID | Attending: Emergency Medicine

## 2019-05-24 ENCOUNTER — Emergency Department: Admit: 2019-05-24 | Payer: MEDICAID

## 2019-05-24 DIAGNOSIS — N186 End stage renal disease: Secondary | ICD-10-CM

## 2019-05-24 LAB — CBC WITH AUTOMATED DIFF
ABS. BASOPHILS: 0 10*3/uL (ref 0.0–0.2)
ABS. EOSINOPHILS: 0.4 10*3/uL (ref 0.0–0.7)
ABS. LYMPHOCYTES: 0.9 10*3/uL — ABNORMAL LOW (ref 1.0–4.8)
ABS. MONOCYTES: 0.5 10*3/uL (ref 0.2–2.4)
ABS. NEUTROPHILS: 1.8 10*3/uL (ref 1.8–7.7)
ABSOLUTE NRBC: 0 10*3/uL
BASOPHILS: 1 % (ref 0.0–2.5)
EOSINOPHILS: 10 % — ABNORMAL HIGH (ref 0.9–2.9)
HCT: 27.8 % — ABNORMAL LOW (ref 36–46)
HGB: 9.1 g/dL — ABNORMAL LOW (ref 13.5–17.5)
LYMPHOCYTES: 24 % (ref 20.5–51.1)
MCH: 30.9 PG — ABNORMAL LOW (ref 31–34)
MCHC: 32.6 g/dL (ref 31.0–36.0)
MCV: 94.7 FL (ref 80–100)
MONOCYTES: 14 % — ABNORMAL HIGH (ref 1.7–9.3)
MPV: 8.2 FL (ref 6.5–11.5)
NEUTROPHILS: 51 % (ref 42–75)
NRBC: 0 PER 100 WBC
PLATELET: 152 10*3/uL
RBC: 2.94 M/uL — ABNORMAL LOW (ref 4.50–5.90)
RDW: 14.9 % — ABNORMAL HIGH (ref 11.5–14.5)
WBC: 3.6 10*3/uL — ABNORMAL LOW (ref 4.4–11.3)

## 2019-05-24 LAB — METABOLIC PANEL, COMPREHENSIVE
A-G Ratio: 1.1 (ref 1.1–2.2)
ALT (SGPT): 13 U/L (ref 12–78)
AST (SGOT): 11 U/L — ABNORMAL LOW (ref 15–37)
Albumin: 3.8 g/dL (ref 3.5–5.0)
Alk. phosphatase: 143 U/L — ABNORMAL HIGH (ref 45–117)
Anion gap: 14 mmol/L (ref 5–15)
BUN/Creatinine ratio: 8 — ABNORMAL LOW (ref 12–20)
BUN: 75 mg/dL — ABNORMAL HIGH (ref 6–20)
Bilirubin, total: 0.2 mg/dL (ref 0.2–1.0)
CO2: 21 mmol/L (ref 21–32)
Calcium: 8.4 mg/dL — ABNORMAL LOW (ref 8.5–10.1)
Chloride: 101 mmol/L (ref 97–108)
Creatinine: 8.95 mg/dL — ABNORMAL HIGH (ref 0.55–1.02)
GFR est AA: 6 mL/min/{1.73_m2} — ABNORMAL LOW (ref >60)
GFR est non-AA: 5 mL/min/{1.73_m2} — ABNORMAL LOW (ref >60)
Globulin: 3.6 g/dL (ref 2.0–4.0)
Glucose: 90 mg/dL (ref 65–100)
Potassium: 4.9 mmol/L (ref 3.5–5.1)
Protein, total: 7.4 g/dL (ref 6.4–8.2)
Sodium: 136 mmol/L (ref 136–145)

## 2019-05-24 LAB — MAGNESIUM
Magnesium: 2.4 mg/dL (ref 1.6–2.4)
Magnesium: 2.4 mg/dL (ref 1.6–2.4)

## 2019-05-24 LAB — CBC WITH AUTO DIFFERENTIAL
Basophils %: 1 % (ref 0.0–2.5)
Basophils Absolute: 0 10*3/uL (ref 0.0–0.2)
Eosinophils %: 10 % — ABNORMAL HIGH (ref 0.9–2.9)
Eosinophils Absolute: 0.4 10*3/uL (ref 0.0–0.7)
Hematocrit: 27.8 % — ABNORMAL LOW (ref 36–46)
Hemoglobin: 9.1 g/dL — ABNORMAL LOW (ref 13.5–17.5)
Lymphocytes %: 24 % (ref 20.5–51.1)
Lymphocytes Absolute: 0.9 10*3/uL — ABNORMAL LOW (ref 1.0–4.8)
MCH: 30.9 PG — ABNORMAL LOW (ref 31–34)
MCHC: 32.6 g/dL (ref 31.0–36.0)
MCV: 94.7 FL (ref 80–100)
MPV: 8.2 FL (ref 6.5–11.5)
Monocytes %: 14 % — ABNORMAL HIGH (ref 1.7–9.3)
Monocytes Absolute: 0.5 10*3/uL (ref 0.2–2.4)
NRBC Absolute: 0 10*3/uL
Neutrophils %: 51 % (ref 42–75)
Neutrophils Absolute: 1.8 10*3/uL (ref 1.8–7.7)
Nucleated RBCs: 0 PER 100 WBC
Platelets: 152 10*3/uL
RBC: 2.94 M/uL — ABNORMAL LOW (ref 4.50–5.90)
RDW: 14.9 % — ABNORMAL HIGH (ref 11.5–14.5)
WBC: 3.6 10*3/uL — ABNORMAL LOW (ref 4.4–11.3)

## 2019-05-24 LAB — COMPREHENSIVE METABOLIC PANEL
ALT: 13 U/L (ref 12–78)
AST: 11 U/L — ABNORMAL LOW (ref 15–37)
Albumin/Globulin Ratio: 1.1 (ref 1.1–2.2)
Albumin: 3.8 g/dL (ref 3.5–5.0)
Alkaline Phosphatase: 143 U/L — ABNORMAL HIGH (ref 45–117)
Anion Gap: 14 mmol/L (ref 5–15)
BUN: 75 mg/dL — ABNORMAL HIGH (ref 6–20)
Bun/Cre Ratio: 8 — ABNORMAL LOW (ref 12–20)
CO2: 21 mmol/L (ref 21–32)
Calcium: 8.4 mg/dL — ABNORMAL LOW (ref 8.5–10.1)
Chloride: 101 mmol/L (ref 97–108)
Creatinine: 8.95 mg/dL — ABNORMAL HIGH (ref 0.55–1.02)
EGFR IF NonAfrican American: 5 mL/min/{1.73_m2} — ABNORMAL LOW (ref >60)
GFR African American: 6 mL/min/{1.73_m2} — ABNORMAL LOW (ref >60)
Globulin: 3.6 g/dL (ref 2.0–4.0)
Glucose: 90 mg/dL (ref 65–100)
Potassium: 4.9 mmol/L (ref 3.5–5.1)
Sodium: 136 mmol/L (ref 136–145)
Total Bilirubin: 0.2 mg/dL (ref 0.2–1.0)
Total Protein: 7.4 g/dL (ref 6.4–8.2)

## 2019-05-24 MED ORDER — HYDROMORPHONE 2 MG TAB
2 mg | Freq: Once | ORAL | Status: AC
Start: 2019-05-24 — End: 2019-05-24
  Administered 2019-05-24: 15:00:00 via ORAL

## 2019-05-24 MED ORDER — PROMETHAZINE 25 MG TAB
25 mg | Freq: Four times a day (QID) | ORAL | Status: DC | PRN
Start: 2019-05-24 — End: 2019-05-24
  Administered 2019-05-24: 15:00:00 via ORAL

## 2019-05-24 MED ORDER — NITROGLYCERIN 2 % TRANSDERMAL OINTMENT
2 % | TRANSDERMAL | Status: AC
Start: 2019-05-24 — End: 2019-05-24
  Administered 2019-05-24: 15:00:00 via TOPICAL

## 2019-05-24 MED FILL — PROMETHAZINE 25 MG TAB: 25 mg | ORAL | Qty: 1

## 2019-05-24 MED FILL — HYDROMORPHONE 2 MG TAB: 2 mg | ORAL | Qty: 1

## 2019-05-24 MED FILL — NITRO-BID 2 % TRANSDERMAL OINTMENT: 2 % | TRANSDERMAL | Qty: 1

## 2019-05-24 NOTE — Progress Notes (Signed)
Per EDIE report:  May 24, 2019 [place] [place] [facility] Chief Complaint: Generalized Pain  May 23, 2019 [place] [facility] Pain, unspecified  Edema, unspecified  Apr 23, 2019 [facility] [place] [place] [facility] Edema, unspecified  Unspecified abdominal pain  Chest pain, unspecified  Apr 09, 2019 [facility] [place] [facility] Fecal impaction  Systemic lupus erythematosus, unspecified  Apr 01, 2019 [facility] [place] [facility] Vomiting, unspecified  Pain, unspecified  Jan 31, 2019 [place] [place] [facility] End stage renal disease

## 2019-05-24 NOTE — ED Provider Notes (Signed)
HPI   Patient reports a history of SLE but not on any current management.  She lives in [place] and states that she is visiting family here in [place].  She states she came to the emergency room after family became concerned about her symptoms-pain all over.  Patient reports over the past several days she has developed body wide pain that is described as sharp, stabbing, and severe.  She also reports that she has chronic renal failure on HD, and has not attended dialysis in over 1 week.  She also reports body wide swelling that she gets when she is not dialyzed.  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  She denies shortness of breath but endorses orthopnea.    Patient reports anaphylactic drug allergies to all pain medicines except hydromorphone and requests this along with Phenergan and Benadryl.  Past Medical History:   Diagnosis Date   ??? Asthma    ??? Lupus ([place])    ??? VSD (ventricular septal defect)        History reviewed. No pertinent surgical history.      History reviewed. No pertinent family history.    Social History     Socioeconomic History   ??? Marital status: Not on file     Spouse name: Not on file   ??? Number of children: Not on file   ??? Years of education: Not on file   ??? Highest education level: Not on file   Occupational History   ??? Not on file   Social Needs   ??? Financial resource strain: Not on file   ??? Food insecurity     Worry: Not on file     Inability: Not on file   ??? Transportation needs     Medical: Not on file     Non-medical: Not on file   Tobacco Use   ??? Smoking status: Never Smoker   ??? Smokeless tobacco: Never Used   Substance and Sexual Activity   ??? Alcohol use: Never     Frequency: Never   ??? Drug use: Never   ??? Sexual activity: Not on file   Lifestyle   ??? Physical activity     Days per week: Not on file     Minutes per session: Not on file   ???  Stress: Not on file   Relationships   ??? Social Product manager on phone: Not on file     Gets together: Not on file     Attends religious service: Not on file     Active member of club or organization: Not on file     Attends meetings of clubs or organizations: Not on file     Relationship status: Not on file   ??? Intimate partner violence     Fear of current or ex partner: Not on file     Emotionally abused: Not on file     Physically abused: Not on file     Forced sexual activity: Not on file   Other Topics Concern   ??? Not on file   Social History Narrative   ??? Not on file         ALLERGIES: Ace inhibitors, Codeine, Hydrocodone, Nsaids (non-steroidal anti-inflammatory drug), Oxycodone, Sulfa (sulfonamide antibiotics), and Tylenol [acetaminophen]    Review of Systems   Constitutional: Positive  for activity change, appetite change, chills, diaphoresis, fatigue and unexpected weight change.   HENT: Negative.    Eyes: Negative.    Respiratory: Positive for shortness of breath.    Cardiovascular: Negative.    Gastrointestinal: Positive for abdominal distention and abdominal pain.   Endocrine: Negative.    Genitourinary: Negative.    Musculoskeletal: Positive for arthralgias, back pain, joint swelling and myalgias.   Skin: Negative.    Allergic/Immunologic: Negative.    Neurological: Negative.    Hematological: Negative.    Psychiatric/Behavioral: Negative.    All other systems reviewed and are negative.      Vitals:    05/24/19 0900 05/24/19 0916   BP: (!) 167/110    Pulse: (!) 101    Resp: 18    Temp: 98.5 ??F (36.9 ??C)    SpO2: 99% 100%   Weight: 72 kg (158 lb 11.7 oz)    Height: 5\' 6"  (1.676 m)             Physical Exam  Vitals signs and nursing note reviewed. Exam conducted with a chaperone present.   Constitutional:       General: She is not in acute distress.     Appearance: Normal appearance. She is obese. She is not ill-appearing, toxic-appearing or diaphoretic.   HENT:      Head: Normocephalic and  atraumatic.      Nose: Nose normal.      Mouth/Throat:      Mouth: Mucous membranes are moist.   Eyes:      Extraocular Movements: Extraocular movements intact.      Pupils: Pupils are equal, round, and reactive to light.   Neck:      Musculoskeletal: Normal range of motion. No neck rigidity.   Cardiovascular:      Rate and Rhythm: Normal rate.      Heart sounds: Normal heart sounds. No murmur. No gallop.       Comments: Multiple sites of what appears to be varicose veins specifically in bilateral EJ's  Pulmonary:      Effort: Pulmonary effort is normal. No respiratory distress.      Breath sounds: Normal breath sounds. No stridor. No wheezing, rhonchi or rales.   Chest:      Chest wall: No tenderness.   Abdominal:      General: Abdomen is flat. There is no distension.      Palpations: Abdomen is soft. There is no mass.      Tenderness: There is no abdominal tenderness. There is no guarding or rebound.      Hernia: No hernia is present.   Musculoskeletal: Normal range of motion.         General: Swelling present. No tenderness, deformity or signs of injury.      Comments: Anasarca   Skin:     General: Skin is warm and dry.      Coloration: Skin is not jaundiced or pale.      Comments: Multiple scars and tracks from therapeutic IV use.   Neurological:      General: No focal deficit present.      Mental Status: She is alert and oriented to person, place, and time. Mental status is at baseline.      Cranial Nerves: No cranial nerve deficit.      Sensory: No sensory deficit.      Motor: No weakness.      Coordination: Coordination normal.      Gait: Gait normal.  Psychiatric:         Mood and Affect: Mood normal.         Behavior: Behavior normal.          MDM     History and physical are suggestive of volume overload from missed HD.  Be lupus, patient does not have any records to verify the diagnosis, she is not on any specific medication for lupus, and is requesting hydromorphone, Phenergan and Benadryl.  Review of  the [place] PMP aware system reveals that patient has been to other ED's in [place] with prescriptions for hydromorphone.  I suspect some level of narcotic seeking.  Will check labs and chest x-ray, provide p.o. pain control.  Patient may need admission for HD.    Procedures

## 2019-05-24 NOTE — ED Notes (Signed)
 Pt reports pain in my head, arms, legs and chest due to my lupus. My left breast also has been swollen for about a week.    Pt reports pain and N/V started about a week ago.     Pt ambulatory to traige.

## 2019-05-24 NOTE — Progress Notes (Signed)
Recent Inpatient Visit Summary  Date Facility [facility] Type Diagnoses or Chief Complaint  May 16, 2019 [place] [facility] [place] [facility] Compression of vein  Systemic lupus erythematosus, unspecified  End stage renal disease  Essential (primary) hypertension  COVID-19  Other symptoms

## 2019-05-24 NOTE — Progress Notes (Signed)
Per EDIE report:  May 24, 2019 [place] [place] [facility] Chief Complaint: Generalized Pain  May 23, 2019 [place] Pain, unspecified  Edema, unspecified  Apr 23, 2019 [facility] [place] [facility] Edema, unspecified  Unspecified abdominal pain  Chest pain, unspecified  Apr 09, 2019 [facility] [place] [facility] Fecal impaction  Systemic lupus erythematosus, unspecified  Apr 01, 2019 [place] [facility] [place] [facility] Vomiting, unspecified  Pain, unspecified  Jan 31, 2019 [place] [place] [facility] End stage renal disease

## 2019-05-24 NOTE — Progress Notes (Signed)
Recent Inpatient Visit Summary  Date Facility [facility] Type Diagnoses or Chief Complaint  May 16, 2019 [place] [facility] [place] [facility] Compression of vein  Systemic lupus erythematosus, unspecified  End stage renal disease  Essential (primary) hypertension  COVID-19  Other symptoms

## 2019-05-24 NOTE — ED Provider Notes (Signed)
HPI   Patient reports a history of SLE but not on any current management.  She lives in [place] and states that she is visiting family here in [place].  She states she came to the emergency room after family became concerned about her symptoms-pain all over.  Patient reports over the past several days she has developed body wide pain that is described as sharp, stabbing, and severe.  She also reports that she has chronic renal failure on HD, and has not attended dialysis in over 1 week.  She also reports body wide swelling that she gets when she is not dialyzed.  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  She denies shortness of breath but endorses orthopnea.    Patient reports anaphylactic drug allergies to all pain medicines except hydromorphone and requests this along with Phenergan and Benadryl.  Past Medical History:   Diagnosis Date   ??? Asthma    ??? Lupus ([place])    ??? VSD (ventricular septal defect)        History reviewed. No pertinent surgical history.      History reviewed. No pertinent family history.    Social History     Socioeconomic History   ??? Marital status: Not on file     Spouse name: Not on file   ??? Number of children: Not on file   ??? Years of education: Not on file   ??? Highest education level: Not on file   Occupational History   ??? Not on file   Social Needs   ??? Financial resource strain: Not on file   ??? Food insecurity     Worry: Not on file     Inability: Not on file   ??? Transportation needs     Medical: Not on file     Non-medical: Not on file   Tobacco Use   ??? Smoking status: Never Smoker   ??? Smokeless tobacco: Never Used   Substance and Sexual Activity   ??? Alcohol use: Never     Frequency: Never   ??? Drug use: Never   ??? Sexual activity: Not on file   Lifestyle   ??? Physical activity     Days per week: Not on file     Minutes per session: Not on file    ??? Stress: Not on file   Relationships   ??? Social Product manager on phone: Not on file     Gets together: Not on file     Attends religious service: Not on file     Active member of club or organization: Not on file     Attends meetings of clubs or organizations: Not on file     Relationship status: Not on file   ??? Intimate partner violence     Fear of current or ex partner: Not on file     Emotionally abused: Not on file     Physically abused: Not on file     Forced sexual activity: Not on file   Other Topics Concern   ??? Not on file   Social History Narrative   ??? Not on file         ALLERGIES: Ace inhibitors, Codeine, Hydrocodone, Nsaids (non-steroidal anti-inflammatory drug), Oxycodone, Sulfa (sulfonamide antibiotics), and Tylenol [acetaminophen]    Review of Systems   Constitutional: Positive  for activity change, appetite change, chills, diaphoresis, fatigue and unexpected weight change.   HENT: Negative.    Eyes: Negative.    Respiratory: Positive for shortness of breath.    Cardiovascular: Negative.    Gastrointestinal: Positive for abdominal distention and abdominal pain.   Endocrine: Negative.    Genitourinary: Negative.    Musculoskeletal: Positive for arthralgias, back pain, joint swelling and myalgias.   Skin: Negative.    Allergic/Immunologic: Negative.    Neurological: Negative.    Hematological: Negative.    Psychiatric/Behavioral: Negative.    All other systems reviewed and are negative.      Vitals:    05/24/19 0900 05/24/19 0916   BP: (!) 167/110    Pulse: (!) 101    Resp: 18    Temp: 98.5 ??F (36.9 ??C)    SpO2: 99% 100%   Weight: 72 kg (158 lb 11.7 oz)    Height: 5\' 6"  (1.676 m)             Physical Exam  Vitals signs and nursing note reviewed. Exam conducted with a chaperone present.   Constitutional:       General: She is not in acute distress.     Appearance: Normal appearance. She is obese. She is not ill-appearing, toxic-appearing or diaphoretic.   HENT:       Head: Normocephalic and atraumatic.      Nose: Nose normal.      Mouth/Throat:      Mouth: Mucous membranes are moist.   Eyes:      Extraocular Movements: Extraocular movements intact.      Pupils: Pupils are equal, round, and reactive to light.   Neck:      Musculoskeletal: Normal range of motion. No neck rigidity.   Cardiovascular:      Rate and Rhythm: Normal rate.      Heart sounds: Normal heart sounds. No murmur. No gallop.       Comments: Multiple sites of what appears to be varicose veins specifically in bilateral EJ's  Pulmonary:      Effort: Pulmonary effort is normal. No respiratory distress.      Breath sounds: Normal breath sounds. No stridor. No wheezing, rhonchi or rales.   Chest:      Chest wall: No tenderness.   Abdominal:      General: Abdomen is flat. There is no distension.      Palpations: Abdomen is soft. There is no mass.      Tenderness: There is no abdominal tenderness. There is no guarding or rebound.      Hernia: No hernia is present.   Musculoskeletal: Normal range of motion.         General: Swelling present. No tenderness, deformity or signs of injury.      Comments: Anasarca   Skin:     General: Skin is warm and dry.      Coloration: Skin is not jaundiced or pale.      Comments: Multiple scars and tracks from therapeutic IV use.   Neurological:      General: No focal deficit present.      Mental Status: She is alert and oriented to person, place, and time. Mental status is at baseline.      Cranial Nerves: No cranial nerve deficit.      Sensory: No sensory deficit.      Motor: No weakness.      Coordination: Coordination normal.      Gait: Gait normal.  Psychiatric:         Mood and Affect: Mood normal.         Behavior: Behavior normal.          MDM      History and physical are suggestive of volume overload from missed HD.  Be lupus, patient does not have any records to verify the diagnosis, she is not on any specific medication for lupus, and is requesting hydromorphone, Phenergan and Benadryl.  Review of the [place] PMP aware system reveals that patient has been to other ED's in [place] with prescriptions for hydromorphone.  I suspect some level of narcotic seeking.  Will check labs and chest x-ray, provide p.o. pain control.  Patient may need admission for HD.    Procedures

## 2019-05-24 NOTE — ED Notes (Addendum)
Pt reports "pain in my head, arms, legs and chest due to my lupus. My left breast also has been swollen for about a week."    Pt reports pain and N/V started "about a week ago.     Pt ambulatory to traige.

## 2021-11-03 NOTE — Unmapped (Signed)
Formatting of this note is different from the original.    Document Name:  Dialysis Rounds-Basic    Patient Name:  Meredith Hawkins, Meredith Hawkins    Chart #:  [id number]    DOB:  Aug 09, 1985    Date:  Nov 03, 2021      The patient was seen on dialysis rounds today.   A review of the dialysis treatment, blood pressure, estimated dry weight and recent lab values was made.     Treatment Data for 11/03/2021 started at:8:09 AM    Dialyzer:    Na: 137 mEq/L     Bicarb: 35 mEq/L     Dialysate:    Dialysate/Machine Temp (prescribed): 37 C     Dialysate/Machine Temp (actual): n/a     BFR (prescribed): 450     BFR (actual): 450     Prescribed time: 04:00     EDW: 79 kg     Access Type: Active (In Use):AVFistula-Unknown/Right Upper Arm  Pre Dialysis Vitals (for 1/1/0001 12:00 AM )    Pre BP (sit): /    Pre Wt: n/a    Temp: n/a  Post Dialysis Vitals (for 11/01/2021 11:45 AM )    Post BP (sit): 159/105    Post Wt: 78 kg  Current Dialysis Vitals (for 11/03/2021 10:32 AM )    BP (sit): 149/93    AP(-) / VP: 193/221    Pulse: 84  [place] data as of 11/03/2021 10:32 AM   Last 3 Treatments 11/01/2021 10/29/2021 10/27/2021   EDW (kg) 79 79 79   Weight Pre (kg) 80.3 81.3 81.3   Weight Post (kg) 78 78.3 78.6   Dialytic Weight Loss (kg) -2.3 -3 -2.7   EDW Deviation (kg) -1 -0.7 -0.4   BP Sit Pre 144/98 157/95 152/110   BP Sit Post 159/105 151/109 176/118   UF Rate (mL/kg/hr) 7 9 8   $ Prescribed BFR 450 450 450   Average Delivered BFR 450 380 450   Prescribed Treatment Time 04:00 04:00 04:00   Actual Treatment Time 04:04 04:05 04:05     Last 3 Values 10/25/2021 10/22/2021 09/24/2021   Access Flow > 2000 1258 > 2000     Treatment Medication Orders    Medication Sig Start Date End Date   Heparin Sodium (Porcine) 1,000 Units/mL Systemic   2000 units IVP Every Treatment 06/07/2021 06/06/2022   Iron Sucrose (Venofer)   50 mg IVP 1X Week During Dialysis 09/29/2021 09/28/2022   Mircera   75 mcg IVP Every 4 weeks During Dialysis 10/27/2021 10/26/2022   Vitamin D (Calcitriol) Oral    0.75 mcg ORAL Every Treatment 07/14/2021 07/13/2022     BUN  mg/dL  70 (10/20/21)  67 (09/22/21)  43 (08/27/21)   UREA NITROGEN (MG/DL) IN SER/PLAS - POST DIALYSIS  mg/dL  15 (10/20/21)  12 (09/22/21)  10 (08/27/21)   CREATININE (MG/DL) IN SER/PLAS  mg/dL  7.97 (10/06/21)  8.75 (09/10/21)  6.80 (07/28/21)   URR  %  79 (10/20/21)  82 (09/22/21)  77 (08/27/21)      spKt/V (Daugirdas II)    1.8700 (10/20/21)  2.1300 (09/22/21)  1.8300 (08/27/21)   eNPCR    1.76 (06/07/21)  1.83 (06/02/21)  .97 (05/12/21)   PCR    100.28 (06/07/21)  106.07 (06/02/21)  60.04 (05/12/21)      IRON BINDING CAPACITY.UNSATURATED (UG/DL) IN SER/PLAS  mcg/dL  128 (10/06/21)  155 (09/10/21)  190 (07/28/21)   IRON BINDING CAPACITY (UG/DL) IN  SER/PLAS  mcg/dL  229 (10/06/21)  208 (09/10/21)  228 (07/28/21)   TRANSFERRIN SAT%  %  44 (10/06/21)  25 (09/10/21)  17 (07/28/21)      HEMOGLOBIN (G/DL) IN BLOOD  g/dL  10.8 (10/27/21)  10.9 (10/20/21)  10.0 (10/06/21)   IRON  mcg/dL  101 (10/06/21)  53 (09/10/21)  38 (07/28/21)   FERRITIN  ng/mL  1126 (10/06/21)  912 (07/07/21)  1065 (06/07/21)      ALBUMIN (G/DL)  g/dL  3.9 (10/06/21)  3.7 (09/10/21)  3.8 (07/28/21)   CALCIUM  mg/dL  8.3 (10/06/21)  8.3 (09/10/21)  8.3 (07/28/21)   Calcium Phos Product    51 (10/06/21)  39 (09/10/21)  30 (07/28/21)      IPTH  pg/mL  349 (10/06/21)  313 (07/23/21)  508 (07/07/21)   PHOSPHATE (MG/DL) IN SER/PLAS  mg/dL  6.1 (10/06/21)  4.7 (09/10/21)  3.6 (07/28/21)   POTASSIUM (MMOL/L) IN SER/PLAS  mEq/L  4.4 (10/06/21)  3.9 (09/10/21)  4.0 (08/11/21)   BICARBONATE (CO2)  mEq/L  20 (10/06/21)  21 (09/10/21)  21 (07/28/21)      NEPHROLOGIST:  Lacretia Leigh MD   LOCATION: [facility]   SCHEDULE:  M-W-F 1st Shift   SPECIAL INSTRUCTIONS:      Problem List Discussion:  SLE on HD since 2016  HTN  Anemia  Secondary hyperPTH  VSD    Comments:  11/03/21 - seen on HD with noted green light - she is doing better overall with better fluid gains and VP controlled - she does not want to talk much and gets aggravated with too many questions - her overall  compliance is much improved as well - otherwise needs August labs etc - EIO  10/29/21 - seen on HD. HD compliance has improved a bit due to not being in the hospital for pain symptoms. Pt usually skips scheduled HD shortly after hospital DC and I strongly advised improving HD compliance as scheduled. BP high, reports good BP med compliance (questionable), significant component of volume overload --> aggressive UF with HD as tolerated. No access issues. Monthly labs pending. Not as irritable as usual today. RM  10/06/21 - seen on HD with noted green light and stable access -still missing treatments and with excessive fluid gains - SBP is still quite elevated with excessive fluid gains - unsure if she is truly taking BP meds as prescribed - again she is responsive to UF which is set at 5L - otherwise labs are basically unchanged with no other new issues - she again is very adamant that she not be woken up during treatment - EIO  09/24/21 - seen on HD with noted green light - still with elevated BP which does respond to UF - she states she is taking her medications as prescribed - she truly does not want to be bothered - good fluid gains though with improvement of K - Ca is stable on current rx - otherwise no other new issues - EIO  09/17/21 - seen on HD with noted green light AW 2.2kg - needs UF challenge and chipping away at EDW - her SBP still remains an issue - elevated pre-HD but does trend down with UF - she is starting to take medications as prescribed per patient - she does not want to be bothered - otherwise no other new issues - EIO  09/13/21 - seen on HD with noted green light and stable VP - she recently was under OBS with self-limiting  diarrhea - she is feeling better - SBP still elevated but does trend down with UF however according to DC summary she is taking ARB/Norvasc/BB - still has not brought in medication list and does not want to talk much - compliance remains a huge issue - still missing frequent  treatments - EIO  08/30/21 - seen on HD - she is actually awake and taking on the phone was able to speak to her - SBP is still quite elevated despite norvasc and BB - hydralazine is not on her list but she is apparently taking this - started ARB not ACE with allergy - she does not want to take this - asked how much hydralazine she takes and she does not know - asked for her to bring in medication and will increase if able - EIO  08/27/21 - seen at the end of HD - still does not want to be bothered and only wants phone calls for communication - she does want to pursue home therapies which ?? is reasonable - she would have to prove compliance and she would have to watch fluid gains - she is a large gainer and still with increase BP - there are a lot of hurdles she would have to overcome - EIO  08/09/21 - seen on HD - still with large fluid gains with AW 6+kg - SBP is still elevated but does trend down with UF and does stabilize - need to also watch K diet and will go over culprits - Ca is improving along with Phos - otherwise still does not want to be woken up or bothered - EIO  08/04/21 - seen on HD. Usual complaints of not wanting to be awoken during tx. Labs reviewed, commended on improved phos, needs to work on low K diet. Med adherence unclear, BP high but significantly due to fluid overload. If not much improvement by next week in BP we should increased BB if HR allows, or add hydralazine. Otherwise stable status. RM  07/26/21 - seen on HD with noted green light - she absolutely does not want to be woken up and is very adamant about this -still with massive fluid gains which seems to be an ongoing issue problem - SBP is elevated so will keep norvasc for now- Ca is low but corrected is normal with recent titration of calcitriol - otherwise no other new issues - EIO  07/23/21 - seen on HD - still does not want to be woken up and requests that this not occur - she still has massive fluid gains with AW 8kg and will yet  again not make EDW - might need to DC norvasc or hold altogether to allow for UF - will offer SEQ if she will allow - Ca is still an issue but had recent adjustment in calcitriol - this has been an ongoing issue - EIO  4/28: Green light. Latest hgb improving. Gentle reminder regarding tums. BP in goal. VP/AP within parameters. [facility]  07/09/21: hgb dropping, denies CP, SOB, dizziness, menorrhagia/hematemesis/ hematochezia/melena; Ca dropped on 3Ca bath, reminded to take her nightly Ca; encouraged to eat phos binder/renal diet compliance (admits that she eats she is not supposed to eat); AP/VP w/n parameters; maintaining AF; expresses no needs/concerns. MGA  07/02/21: Clearance okay, Hgb now at goal. Not very irritable about being woken up today but not much to say, no complaints or needs expressed, told her to let me know if she thinks of anything I can do for her. -HB  06/23/21 - Seen on HD. Pt irritated due to being woken up, I reminded her that she should be awake when medical team members are rounding but she was not receptive to this. Minimal conversation with yes/no responses. Lower EDW to 80kg for now. Labs reviewed, on ESA, needs to work on binder adherence. Overall stable status. RM  06/18/21 - seen at [facility] as she was at [facility] - does not want to be woken up or bothered at this time - she still needs to go to vascular surgeon and she still needs to go to cardiology - lack of follow up seems to be an issue - recent adjustment in ESA with lowering Hgb - better fluid gains and stable BP that does trend down with UF - need to look at BP meds etc - otherwise no other new issues - EIO  missed txs recently   needs to limit fluid intake  BPS stable  hgb at goal  access OK now - to see surgeon about thinned area  no appointment with cardiology yet for VSD  BMD labs with higher phos, counseled to take ca acetate as rx'd  needs to increase protein intake         Anise Salvo, RN, ACNP, CCRN  [ Signed And  locked electronically On 11/03/2021 at 10:40:14 AM ]    Transcribed: Anise Salvo ( 11/03/2021 )    Electronically signed by Anise Salvo, ACNP at 11/03/2021 10:40 AM EDT

## 2021-11-27 NOTE — ED Provider Notes (Signed)
Formatting of this note is different from the original.    [facility]    ED Provider Note    Meredith Hawkins 36 y.o. female DOB: 14-Dec-1985 MRN: [id number]  History     Chief Complaint   Patient presents with   ? Throat Pain     Pt c/o throat pain/swelling that radiates to R side of chest and abdomen, HA, N/V/D since last night.      This is a 36 year old female with a complicated medical history which includes end-stage renal disease on dialysis, hypertension, lupus nephritis, SVC syndrome, presents with multiple complaints.  Patient states that she is fatigued and has had some nausea and vomiting over the past 24 hours.  Of note: This is the patient's third emergency department visit in less than 24 hours.  She was seen at [facility] for numerous complaints yesterday followed by a subsequent visit at this hospital earlier today and now presents again.  Patient states she is in from out of town visiting family.  She normally follows at [place].  Patient endorses compliance with dialysis.  Denies fever, chills or trauma.    Past Medical History:   Diagnosis Date   ? Dialysis patient (*)    ? Hypertension    ? Lupus nephritis (*)    ? Superior vena cava syndrome      No past surgical history on file.    Social History     Substance and Sexual Activity   Alcohol Use Never     Social History     Tobacco Use   Smoking Status Never   Smokeless Tobacco Never     E-Cigarettes   ? Vaping Use     ? Start Date     ? Cartridges/Day     ? Quit Date       Social History     Substance and Sexual Activity   Drug Use Never           Allergies   Allergen Reactions   ? Ace Inhibitors Unknown   ? Acetaminophen Unknown   ? Fentanyl Unknown   ? Hydrocodone Unknown   ? Nsaids Unknown   ? Oxycodone Unknown   ? Roxicodone Unknown   ? Sulfa Antibiotics Unknown     Discharge Medication List as of 11/27/2021  6:31 AM     CONTINUE these medications which have NOT CHANGED    Details   ondansetron  (ZOFRAN-ODT) 4 mg disintegrating tablet Take one tablet (4 mg dose) by mouth every 8 (eight) hours as needed for up to 7 days., Starting Fri 11/26/2021, Until Fri 12/03/2021 at 2359, Print         Review of Systems     Review of Systems   Constitutional: Negative for appetite change, fatigue and fever.   HENT: Negative for trouble swallowing.    Respiratory: Negative for cough, chest tightness and shortness of breath.    Cardiovascular: Negative for chest pain.   Gastrointestinal: Positive for nausea and vomiting. Negative for abdominal pain and diarrhea.   Endocrine: Negative for polyuria.   Genitourinary: Negative for difficulty urinating, vaginal bleeding and vaginal discharge.   Musculoskeletal: Negative for arthralgias, back pain and neck pain.   Skin: Negative for rash.   Neurological: Negative for weakness and headaches.   Psychiatric/Behavioral: Negative for sleep disturbance.     Physical Exam     ED Triage Vitals [11/27/21 0229]   BP (!) 166/85  Heart Rate 116   Resp 16   SpO2 95 %   Temp 97.5 F (36.4 C)     Physical Exam   Nursing note and vitals reviewed.  Constitutional: She appears well-developed and well-nourished.   HENT:   Head: Normocephalic and atraumatic.   Mouth/Throat: Voice normal.   Eyes: EOM are intact. Conjunctivae are normal. Pupils are equal, round, and reactive to light.   Neck: Normal range of motion and voice normal. Neck supple.   Cardiovascular: Normal rate, regular rhythm, S1 normal and S2 normal.   Audible murmur.    Systolic murmur is present.No friction rub and gallop.   Prominent anterior/superior chest wall veins are noted into her right shoulder, chest and neck.   Pulmonary/Chest: Respiratory effort normal. No wheezing. No rhonchi. No rales.  Abdominal: Soft. There is no abdominal tenderness. There is no guarding and no rebound. Bowel sounds are normal. There is no CVA tenderness. No ascites present.   Musculoskeletal: No obvious deformity noted to extremities.      Cervical  back: Normal range of motion and neck supple. no edema.    Lymphadenopathy:     No cervical adenopathy.   Neurological: She is alert and oriented to person, place, and time. Moves all extremities equally. She has normal speech. Cranial nerves intact II through XII. She has normal strength.   Skin: Skin is warm. No rash noted.   Psychiatric: She has a normal mood and affect. Thought content normal.     ED Course     Lab results:    CBC AND DIFFERENTIAL - Abnormal       Result Value    WBC 3.8      RBC 3.41 (*)     HGB 10.3 (*)     HCT 32.2 (*)     MCV 94      MCH 30.2      MCHC 32.0      Plt Ct 157      RDW SD 54.7 (*)     MPV 9.9      NRBC% 0.0      NRBC 0.000      NEUTROPHIL % 50.6      LYMPHOCYTE % 30.5      MONOCYTE % 9.3      Eosinophil % 7.4 (*)     BASOPHIL % 1.9      IG% 0.300      ABSOLUTE NEUTROPHIL COUNT 1.91      ABSOLUTE LYMPHOCYTE COUNT 1.2      MONO ABSOLUTE 0.4      EOS ABSOLUTE 0.3      BASO ABSOLUTE 0.1      IG ABSOLUTE 0.010     COMPREHENSIVE METABOLIC PANEL - Abnormal    Na 136      Potassium 4.9      Cl 96 (*)     CO2 19 (*)     AGAP 21 (*)     Glucose 67      BUN 60 (*)     Creatinine 11.65 (*)     Ca 8.5 (*)     ALK PHOS 135      T Bili 0.15      Total Protein 7.5      Alb 4.6      GLOBULIN 2.9      ALBUMIN/GLOBULIN RATIO 1.6      BUN/CREAT RATIO 5.2 (*)     ALT 11      AST  9      eGFR 4      Comment: Normal GFR (glomerular filtration rate) > 60 mL/min/1.73 meters squared, < 60 may include impaired kidney function.  Calculation based on the Chronic Kidney Disease Epidemiology Collaboration (CK-EPI)equation refit without adjustment for race.   COVID-19, FLU A+B AND RSV - Normal    Flu A Negative      Flu B Negative      RSV PCR Negative      SARS-COV-2 Not Detected      Narrative:     SARS-COV-2 (COVID-19)PCR-Negative results do not preclude SARS-CoV-2 infection and should not be used as the sole basis for patient management decisions. Negative results must be combined with clinical  observations, patient history, and epidemiological information.    Flu and/or RSV - Negative results do not preclude the presence of Flu or RSV virus and should not be used as the sole basis for treatment or other patient management decisions. False negative results may occur if virus is present at levels below the analytical limit of detection.    This test detects Influenza A, Influenza B, and Respiratory Syncytial Virus and SARS-COV-2 (COVID-19) by PCR.    Testing was performed using the CEPHEID SARS-CoV-2 EUA ASSAY:   The Cepheid SARS-CoV-2 EUA assay has not been FDA cleared or approved.  It has been authorized by FDA under an Emergency Use Authorization (EUA).  The test has been authorized only for the detection of nucleic acid from SARS-CoV-2, not for any other viruses or pathogens.  It is only authorized for the duration of time the declaration that circumstances exist justifying the authorization of the emergency use of in vitro diagnostic tests for detection of SARS-CoV-2 virus and/or diagnosis of COVID-19 infection under section 564(b) (1) of the Act, 21 U.S.C [phone] (b) (1), unless the authorization is terminated or revoked sooner.     Link to Patient Fact Sheet:   PinkCheek.be     Link to Provider Fact Sheet   GravelBags.it      BETA STREP SCREEN THROAT - Normal    Strep A Negative     LIPASE - Normal    Lipase 50     HUMAN CHORIONIC GONADOTROPIN (HCG), BETA-SUBUNIT, QUALITATIVE - Normal    Beta HCG Qual Negative     CULTURE, THROAT   LIGHT BLUE TOP   GOLD SST     Imaging:  No data to display    ECG:  ECG Results    None                                  Pre-Sedation  Procedures      Medical Decision Making  Differential diagnosis includes metabolic derangement, volume overload, infectious etiology, traumatic etiology or other.    Again: This is patient's third ED visit in the past 24 hours.  [facility].    Laboratory evaluation was again repeated this evening: No significant changes are noted.    Patient's vital signs are within a reasonable limit.  Heart rate on my exam is in the mid to upper 80s.    Prior records reviewed: Please see HPI, the patient also with approximately 21 ED visits in the past 2 months between [name] as well as [place].  After review of these records I feel the patient is  at her baseline regarding her chronic disease and requires no further work-up here.    Patient is amenable to the plan.  She has no further complaint.    Patient appears non-toxic and is hemodynamically stable at this time.     Meredith Hawkins has been re-evaluated and is ready for discharge. Reviewed available results with her. I have counseled her on diagnosis and care plan, she has expressed understanding, and all questions have been answered. Patient agrees with plan and agrees to F/U as recommended, or return to the ED if her sxs worsen. Discharge instructions have been provided and explained to this patient, along with reasons to return to the ED.     Amount and/or Complexity of Data Reviewed  Labs: ordered.    Provider Communication    Discharge Medication List as of 11/27/2021  6:31 AM       Discharge Medication List as of 11/27/2021  6:31 AM       Discharge Medication List as of 11/27/2021  6:31 AM       Clinical Impression     Final diagnoses:   SVC syndrome     ED Disposition     ED Disposition   Discharge    Condition   Stable    Comment   --                  Follow-up Information     [facility].    Specialty: Emergency Medicine  Comments: As needed  Contact information:  [place] [phone]  [phone]                Electronically signed by:    Graylon Good, PA-C  11/27/21 2359    Electronically signed by Tonye Pearson, MD at 11/28/2021  4:05 AM EDT    Associated  attestation - Tonye Pearson, MD - 11/28/2021  4:05 AM EDT  Formatting of this note might be different from the original.  I have reviewed and agree with the APP's findings and plan for this patient.  Tonye Pearson, MD  Emergency Department - 11/28/2021 4:05 AM

## 2021-11-29 ENCOUNTER — Other Ambulatory Visit: Payer: Self-pay

## 2021-11-29 ENCOUNTER — Encounter (HOSPITAL_COMMUNITY): Payer: Self-pay | Admitting: Emergency Medicine

## 2021-11-29 ENCOUNTER — Emergency Department (HOSPITAL_COMMUNITY)
Admission: EM | Admit: 2021-11-29 | Discharge: 2021-11-30 | Disposition: A | Discharge status: Home / Self Care | Payer: Medicaid Other | Attending: Emergency Medicine | Admitting: Emergency Medicine

## 2021-11-29 DIAGNOSIS — Z79899 Other long term (current) drug therapy: Secondary | ICD-10-CM | POA: Insufficient documentation

## 2021-11-29 DIAGNOSIS — R392 Extrarenal uremia: Secondary | ICD-10-CM | POA: Insufficient documentation

## 2021-11-29 DIAGNOSIS — N186 End stage renal disease: Secondary | ICD-10-CM | POA: Insufficient documentation

## 2021-11-29 DIAGNOSIS — E1122 Type 2 diabetes mellitus with diabetic chronic kidney disease: Secondary | ICD-10-CM | POA: Diagnosis not present

## 2021-11-29 DIAGNOSIS — E875 Hyperkalemia: Secondary | ICD-10-CM | POA: Insufficient documentation

## 2021-11-29 DIAGNOSIS — N19 Unspecified kidney failure: Secondary | ICD-10-CM

## 2021-11-29 DIAGNOSIS — Z992 Dependence on renal dialysis: Secondary | ICD-10-CM | POA: Diagnosis not present

## 2021-11-29 DIAGNOSIS — R222 Localized swelling, mass and lump, trunk: Secondary | ICD-10-CM | POA: Diagnosis present

## 2021-11-29 DIAGNOSIS — I120 Hypertensive chronic kidney disease with stage 5 chronic kidney disease or end stage renal disease: Secondary | ICD-10-CM | POA: Diagnosis not present

## 2021-11-29 MED ORDER — ONDANSETRON 4 MG PO TBDP
4.0000 mg | ORAL_TABLET | Freq: Once | ORAL | Status: AC
  Administered 2021-11-29: 4 mg via ORAL
  Filled 2021-11-29: qty 1

## 2021-11-29 NOTE — ED Triage Notes (Addendum)
Patient reports swelling at arms/legs,neck and right breast with generalized body aches onset Sunday , respirations unlabored , no fever or chills , hemodialysis treatment q Mon/Wed/Fri. Hypertensive at triage.

## 2021-11-29 NOTE — ED Provider Triage Note (Signed)
Emergency Medicine Provider Triage Evaluation Note  Evelyn Bryant , a 36 y.o. female with history of ESRD on dialysis Monday Wednesday Friday was evaluated in triage.  Pt complains of swelling to her neck, right upper arm and lower legs that has been developing over the last several days.  Patient states that she has not not gone to dialysis since last Wednesday.  She states there has been trouble with the transport service and she has been unable to make her appointments.  She now has swelling from the right upper extremity where her fistula is up into her right neck.  Swelling is tender to palpation.  She endorses nausea. She denies abdominal pain, dysuria and diarrhea. Review of Systems  Positive: As above Negative:   Physical Exam  BP (!) 182/116 (BP Location: Right Arm)   Pulse 75   Temp 98.3 F (36.8 C) (Oral)   Resp 20   SpO2 99%  Gen:   Awake, no distress   Resp:  Normal effort  MSK:   Moves extremities without difficulty  Other:  Fistula noted to the right upper extremity, appears patent.  Mild swelling noted to the right upper extremity extending up into the right neck.  No obvious swelling to the abdomen or lower extremities  Medical Decision Making  Medically screening exam initiated at 9:48 PM.  Appropriate orders placed.  Evelyn Bryant was informed that the remainder of the evaluation will be completed by another provider, this initial triage assessment does not replace that evaluation, and the importance of remaining in the ED until their evaluation is complete.     Evelyn Bryant, [place] 11/29/21 2201

## 2021-11-30 LAB — URINALYSIS, ROUTINE W REFLEX MICROSCOPIC
Bilirubin Urine: NEGATIVE
Glucose, UA: NEGATIVE mg/dL
Ketones, ur: NEGATIVE mg/dL
Nitrite: NEGATIVE
Protein, ur: 100 mg/dL — AB
Specific Gravity, Urine: 1.012 (ref 1.005–1.030)
pH: 7 (ref 5.0–8.0)

## 2021-11-30 LAB — CBC WITH DIFFERENTIAL/PLATELET
Abs Immature Granulocytes: 0.01 10*3/uL (ref 0.00–0.07)
Basophils Absolute: 0.1 10*3/uL (ref 0.0–0.1)
Basophils Relative: 2 %
Eosinophils Absolute: 0.3 10*3/uL (ref 0.0–0.5)
Eosinophils Relative: 7 %
HCT: 27.4 % — ABNORMAL LOW (ref 36.0–46.0)
Hemoglobin: 9.1 g/dL — ABNORMAL LOW (ref 12.0–15.0)
Immature Granulocytes: 0 %
Lymphocytes Relative: 25 %
Lymphs Abs: 1 10*3/uL (ref 0.7–4.0)
MCH: 30.6 pg (ref 26.0–34.0)
MCHC: 33.2 g/dL (ref 30.0–36.0)
MCV: 92.3 fL (ref 80.0–100.0)
Monocytes Absolute: 0.5 10*3/uL (ref 0.1–1.0)
Monocytes Relative: 13 %
Neutro Abs: 2 10*3/uL (ref 1.7–7.7)
Neutrophils Relative %: 53 %
Platelets: 159 10*3/uL (ref 150–400)
RBC: 2.97 MIL/uL — ABNORMAL LOW (ref 3.87–5.11)
RDW: 15.3 % (ref 11.5–15.5)
WBC: 3.8 10*3/uL — ABNORMAL LOW (ref 4.0–10.5)
nRBC: 0 % (ref 0.0–0.2)

## 2021-11-30 LAB — COMPREHENSIVE METABOLIC PANEL
ALT: 11 U/L (ref 0–44)
AST: 12 U/L — ABNORMAL LOW (ref 15–41)
Albumin: 3.4 g/dL — ABNORMAL LOW (ref 3.5–5.0)
Alkaline Phosphatase: 70 U/L (ref 38–126)
Anion gap: 22 — ABNORMAL HIGH (ref 5–15)
BUN: 115 mg/dL — ABNORMAL HIGH (ref 6–20)
CO2: 12 mmol/L — ABNORMAL LOW (ref 22–32)
Calcium: 7.6 mg/dL — ABNORMAL LOW (ref 8.9–10.3)
Chloride: 102 mmol/L (ref 98–111)
Creatinine, Ser: 17.44 mg/dL — ABNORMAL HIGH (ref 0.44–1.00)
GFR, Estimated: 2 mL/min — ABNORMAL LOW (ref >60)
Glucose, Bld: 70 mg/dL (ref 70–99)
Potassium: 5.9 mmol/L — ABNORMAL HIGH (ref 3.5–5.1)
Sodium: 136 mmol/L (ref 135–145)
Total Bilirubin: 0.4 mg/dL (ref 0.3–1.2)
Total Protein: 6.2 g/dL — ABNORMAL LOW (ref 6.5–8.1)

## 2021-11-30 LAB — HEPATITIS B CORE ANTIBODY, TOTAL: Hep B Core Total Ab: NONREACTIVE

## 2021-11-30 LAB — HEPATITIS B SURFACE ANTIBODY,QUALITATIVE: Hep B S Ab: REACTIVE — AB

## 2021-11-30 LAB — HEPATITIS B SURFACE ANTIGEN: Hepatitis B Surface Ag: NONREACTIVE

## 2021-11-30 LAB — HEPATITIS C ANTIBODY: HCV Ab: NONREACTIVE

## 2021-11-30 MED ORDER — OXYCODONE HCL 5 MG PO TABS: 5.0000 mg | ORAL_TABLET | ORAL | Status: DC | PRN

## 2021-11-30 MED ORDER — CALCIUM GLUCONATE-NACL 1-0.675 GM/50ML-% IV SOLN
1.0000 g | Freq: Once | INTRAVENOUS | Status: AC
  Administered 2021-11-30: 1000 mg via INTRAVENOUS
  Filled 2021-11-30: qty 50

## 2021-11-30 MED ORDER — LIDOCAINE HCL (PF) 1 % IJ SOLN: 5.0000 mL | INTRAMUSCULAR | Status: DC | PRN

## 2021-11-30 MED ORDER — ONDANSETRON 4 MG PO TBDP: 4.0000 mg | ORAL_TABLET | Freq: Once | ORAL | Status: DC

## 2021-11-30 MED ORDER — ONDANSETRON 4 MG PO TBDP: 4.0000 mg | ORAL_TABLET | Freq: Three times a day (TID) | ORAL | 0 refills | Status: DC | PRN

## 2021-11-30 MED ORDER — AMLODIPINE BESYLATE 5 MG PO TABS
10.0000 mg | ORAL_TABLET | Freq: Once | ORAL | Status: AC
  Administered 2021-11-30: 10 mg via ORAL
  Filled 2021-11-30: qty 2

## 2021-11-30 MED ORDER — SODIUM ZIRCONIUM CYCLOSILICATE 10 G PO PACK
10.0000 g | PACK | Freq: Once | ORAL | Status: AC
  Administered 2021-11-30: 10 g via ORAL
  Filled 2021-11-30: qty 1

## 2021-11-30 MED ORDER — HYDROMORPHONE HCL 1 MG/ML IJ SOLN
1.0000 mg | Freq: Once | INTRAMUSCULAR | Status: AC
  Administered 2021-11-30: 1 mg via INTRAVENOUS
  Filled 2021-11-30: qty 1

## 2021-11-30 MED ORDER — PENTAFLUOROPROP-TETRAFLUOROETH EX AERO: 1.0000 | INHALATION_SPRAY | CUTANEOUS | Status: DC | PRN

## 2021-11-30 MED ORDER — HYDROMORPHONE HCL 1 MG/ML IJ SOLN
0.5000 mg | INTRAMUSCULAR | Status: DC | PRN
  Administered 2021-11-30: 0.5 mg via INTRAVENOUS

## 2021-11-30 MED ORDER — ALTEPLASE 2 MG IJ SOLR: 2.0000 mg | Freq: Once | INTRAMUSCULAR | Status: DC | PRN

## 2021-11-30 MED ORDER — HYDROMORPHONE HCL 1 MG/ML IJ SOLN: 0.5000 mg | INTRAMUSCULAR | Status: DC | PRN

## 2021-11-30 MED ORDER — HEPARIN SODIUM (PORCINE) 1000 UNIT/ML DIALYSIS
1000.0000 [IU] | INTRAMUSCULAR | Status: DC | PRN
  Administered 2021-11-30: 1000 [IU]
  Filled 2021-11-30 (×2): qty 1

## 2021-11-30 MED ORDER — HYDRALAZINE HCL 20 MG/ML IJ SOLN
5.0000 mg | Freq: Once | INTRAMUSCULAR | Status: AC
  Administered 2021-11-30: 5 mg via INTRAVENOUS
  Filled 2021-11-30: qty 1

## 2021-11-30 MED ORDER — CHLORHEXIDINE GLUCONATE CLOTH 2 % EX PADS: 6.0000 | MEDICATED_PAD | Freq: Every day | CUTANEOUS | Status: DC

## 2021-11-30 MED ORDER — LIDOCAINE-PRILOCAINE 2.5-2.5 % EX CREA: 1.0000 | TOPICAL_CREAM | CUTANEOUS | Status: DC | PRN

## 2021-11-30 MED ORDER — SODIUM CHLORIDE 0.9 % IV SOLN
12.5000 mg | Freq: Four times a day (QID) | INTRAVENOUS | Status: DC | PRN
  Administered 2021-11-30 (×2): 12.5 mg via INTRAVENOUS
  Filled 2021-11-30 (×2): qty 0.5

## 2021-11-30 MED ORDER — ANTICOAGULANT SODIUM CITRATE 4% (200MG/5ML) IV SOLN: 5.0000 mL | Status: DC | PRN

## 2021-11-30 MED ORDER — HYDROMORPHONE HCL 1 MG/ML IJ SOLN
INTRAMUSCULAR | Status: AC
  Filled 2021-11-30: qty 0.5

## 2021-11-30 NOTE — ED Notes (Signed)
Pt refused to let this RN attempt to draw blood. Pt states she will only let IV team stick her.

## 2021-11-30 NOTE — ED Provider Notes (Signed)
Patient returns from dialysis in stable condition, vitals stable throughout dialysis session.  Prescribed nausea medication to her pharmacy.  Per nephrologist consultation this morning, she is okay for discharge and follow-up outpatient after dialysis - hyperK appropriately addressed through dialysis   Wyvonnia Dusky, MD 11/30/21 2312

## 2021-11-30 NOTE — ED Notes (Signed)
Pt refused vitals. Stated "there is no use in you getting my vitals if I am being discharged. There is no need for it."

## 2021-11-30 NOTE — Progress Notes (Signed)
This rn updated pt nursing awaiting pain med from pharmacy. Phenergan administered at 1342. Pt conts to doze off and on. Hd tech at bedside

## 2021-11-30 NOTE — ED Provider Notes (Signed)
[facility]GENC[facility] DEPARTMENT Provider Note   CSN: [id number] Arrival date & time: 11/29/21  2053     History  Chief Complaint  Patient presents with   Swelling    Avy Zobrist is a 36 y.o. female.  36 year old female here with multiple complaints.  Patient is a history of lupus nephritis end-stage renal disease and dialysis Monday Wednesday Friday and hypertension, diabetes who presents to the ER today secondary to diffuse body pain, swelling of her abdomen, neck, arms, pain in her arms chest abdomen legs and head.  States that she has had dialysis since last Wednesday because there is significant transportation issues wake [place].  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  Has not tried anything at home for her discomfort.  Has had episodes of nonbloody nonbilious emesis.  On review of the records this is the patient's sixth visit in the last 4 days to multiple ED's with similar complaints.  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  Told to follow-up with her dialysis center but apparently has not been able to make it back to her primary center and even though she has been in [place] and now here over the last couple days.        Home Medications Prior to Admission medications   Medication Sig Start Date End Date Taking? Authorizing Provider  amLODipine (NORVASC) 10 MG tablet Take 10 mg by mouth daily. 04/12/17   [provider]  calcium acetate (PHOSLO) 667 MG capsule Take 667 mg by mouth 3 (three) times daily with meals.    [provider]  HYDROmorphone (DILAUDID) 2 MG tablet Take 0.5 tablets (1 mg total) by mouth every 4 (four) hours as needed for severe pain. 09/23/17   Isla Pence, MD  promethazine (PHENERGAN) 25 MG tablet Take 1 tablet (25 mg total) by mouth every 6 (six) hours as needed for nausea or vomiting. 09/23/17   Isla Pence, MD      Allergies    Acetaminophen, Codeine,  Enalapril, Fentanyl, Hydrocodone, Oxycodone, Sulfa antibiotics, and Nsaids    Review of Systems   Review of Systems  Physical Exam Updated Vital Signs BP 139/84   Pulse 84   Temp 97.8 F (36.6 C) (Oral)   Resp 17   SpO2 100%  Physical Exam Vitals and nursing note reviewed.  Constitutional:      Appearance: She is well-developed.  HENT:     Head: Normocephalic and atraumatic.     Nose: No congestion or rhinorrhea.  Eyes:     Pupils: Pupils are equal, round, and reactive to light.  Cardiovascular:     Rate and Rhythm: Normal rate and regular rhythm.  Pulmonary:     Effort: No respiratory distress.     Breath sounds: No stridor.  Abdominal:     General: Abdomen is flat. There is no distension.  Musculoskeletal:        General: No swelling or tenderness. Normal range of motion.     Cervical back: Normal range of motion.  Skin:    General: Skin is warm.  Neurological:     General: No focal deficit present.     Mental Status: She is alert.     ED Results / Procedures / Treatments   Labs (all labs ordered are listed, but only abnormal results are displayed) Labs Reviewed  COMPREHENSIVE METABOLIC PANEL - Abnormal; Notable for the following components:  Result Value   Potassium 5.9 (*)    CO2 12 (*)    BUN 115 (*)    Creatinine, Ser 17.44 (*)    Calcium 7.6 (*)    Total Protein 6.2 (*)    Albumin 3.4 (*)    AST 12 (*)    GFR, Estimated 2 (*)    Anion gap 22 (*)    All other components within normal limits  CBC WITH DIFFERENTIAL/PLATELET - Abnormal; Notable for the following components:   WBC 3.8 (*)    RBC 2.97 (*)    Hemoglobin 9.1 (*)    HCT 27.4 (*)    All other components within normal limits  URINALYSIS, ROUTINE W REFLEX MICROSCOPIC    EKG EKG Interpretation  Date/Time:  Monday November 29 2021 21:54:00 EDT Ventricular Rate:  72 PR Interval:  240 QRS Duration: 110 QT Interval:  420 QTC Calculation: 459 R Axis:   53 Text  Interpretation: Sinus rhythm with 1st degree A-V block Cannot rule out Anterior infarct , age undetermined Abnormal ECG No previous ECGs available Confirmed by Merrily Pew [phone]) on 11/30/2021 4:25:43 AM  Radiology No results found.  Procedures .Critical Care  Performed by: Merrily Pew, MD Authorized by: Merrily Pew, MD   Critical care provider statement:    Critical care time (minutes):  30   Critical care was necessary to treat or prevent imminent or life-threatening deterioration of the following conditions:  Endocrine crisis   Critical care was time spent personally by me on the following activities:  Development of treatment plan with patient or surrogate, discussions with consultants, evaluation of patient's response to treatment, examination of patient, ordering and review of laboratory studies, ordering and review of radiographic studies, ordering and performing treatments and interventions, pulse oximetry, re-evaluation of patient's condition and review of old charts   Medications Ordered in ED Medications  promethazine (PHENERGAN) 12.5 mg in sodium chloride 0.9 % 50 mL IVPB (0 mg Intravenous Stopped 11/30/21 0305)  calcium gluconate 1 g/ 50 mL sodium chloride IVPB (1,000 mg Intravenous New Bag/Given 11/30/21 0551)  ondansetron (ZOFRAN-ODT) disintegrating tablet 4 mg (4 mg Oral Given 11/29/21 2152)  HYDROmorphone (DILAUDID) injection 1 mg (1 mg Intravenous Given 11/30/21 0236)  hydrALAZINE (APRESOLINE) injection 5 mg (5 mg Intravenous Given 11/30/21 0236)  amLODipine (NORVASC) tablet 10 mg (10 mg Oral Given 11/30/21 0235)  sodium zirconium cyclosilicate (LOKELMA) packet 10 g (10 g Oral Given 11/30/21 0553)  HYDROmorphone (DILAUDID) injection 1 mg (1 mg Intravenous Given 11/30/21 0548)    ED Course/ Medical Decision Making/ A&P                           Medical Decision Making Risk Prescription drug management.  Patient does not seem to be entirely truthful on her history  given.  Will evaluate for any emergent disease related not having dialysis such as hyperkalemia fluid overload.  If these are reassuring or able to be temporized I do not see any reason for admission or emergent dialysis at this moment.  Hyper K with peaked t waves - calcium and lokelma - d/w Dr. Johnney Ou with nephro and will dialyze this AM with likely return to ED for discharge.  BUN and creatinine significantly elevated likely related to pain and itching.    Final Clinical Impression(s) / ED Diagnoses Final diagnoses:  Hyperkalemia  Uremia    Rx / DC Orders ED Discharge Orders     None  Zora Glendenning, Corene Cornea, MD 11/30/21 [phone]

## 2021-11-30 NOTE — Discharge Instructions (Addendum)
You are given dialysis in the hospital today for your kidney disease and your high potassium levels.  I prescribed you nausea medication to the pharmacy.  Please follow-up for your neck scheduled dialysis session.  It is important that you attend your regularly scheduled dialysis centers sessions.

## 2021-11-30 NOTE — Consult Note (Signed)
[name]  INPATIENT CONSULTATION  Reason for Consultation: ESRD Requesting Provider: Dr. Dayna Barker  HPI: Evelyn Bryant is an 36 y.o. female with h/o SLE, ESRD on HD MWF since 2017, h/o SVC syndrome, HTN, DM who is seen in consultation for ESRD and associated conditions.  Last HD was Wed of last week at home unit [place]. 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  She presented to ED last PM with multiple complaints - mainly swelling related, face, arms, abd.  She also has assoc body pains.  Denies f/c/n/v/d but does say blood in BMs.   Per ED notes she's had 6 ED visits in past 4 days with the same complaints.  Labs showing K 5.9, Bicarb 12, BUN 115, Cr 17, Ca 7.6, Alb 3.4, Hb 9.1, Plt 159.   Has RUE AVF which she's not had issues with recently.   Says face swollen and new neck veins.  No tongue or lip swelling, no dysphagia, hoarseness, no difficulty breathing.    PMH: Past Medical History:  Diagnosis Date   Kidney failure    Lupus ([place])    PSH: History reviewed. No pertinent surgical history.   Past Medical History:  Diagnosis Date   Kidney failure    Lupus ([place])     Medications:  I have reviewed the patient's current medications.  (Not in a hospital admission)   ALLERGIES:   Allergies  Allergen Reactions   Acetaminophen Anaphylaxis   Codeine Anaphylaxis   Enalapril Anaphylaxis   Fentanyl Anaphylaxis   Hydrocodone Anaphylaxis   Oxycodone Anaphylaxis   Sulfa Antibiotics Anaphylaxis and Other (See Comments)    Other reaction(s): Anaphylaxis   Nsaids     Not an actual allergy, pt cannot take as a dialysis pt    FAM HX: No family history on file.  Social History:   reports that she has never smoked. She has never used smokeless tobacco. She reports that she does not drink alcohol and does not use drugs.  ROS: 12 system ROS per HPI above  Blood pressure 132/71, pulse 83, temperature (!) 97.5 F  (36.4 C), temperature source Oral, resp. rate 14, SpO2 98 %. PHYSICAL EXAM: Gen: chronically ill appearing woman who is comfortable on stretcher  Eyes: anicteric, mild periorbital edema ENT: tongue midline, no lip or tongue swelling Neck: distended collateral neck veins CV:  RRR, no rub Abd:  soft, mildly distended Lungs: clear, on RA Extr:  trace diffuse edema Neuro: AOx3, conversant Skin: no rashes or lesions HD access: RUE AVF +t/b, mild aneurysms with no scabs or ulcers   Results for orders placed or performed during the hospital encounter of 11/29/21 (from the past 48 hour(s))  Comprehensive metabolic panel     Status: Abnormal   Collection Time: 11/30/21  2:30 AM  Result Value Ref Range   Sodium 136 135 - 145 mmol/L   Potassium 5.9 (H) 3.5 - 5.1 mmol/L   Chloride 102 98 - 111 mmol/L   CO2 12 (L) 22 - 32 mmol/L   Glucose, Bld 70 70 - 99 mg/dL    Comment: Glucose reference range applies only to samples taken after fasting for at least 8 hours.   BUN 115 (H) 6 - 20 mg/dL   Creatinine, Ser 17.44 (H) 0.44 - 1.00 mg/dL   Calcium 7.6 (L) 8.9 - 10.3 mg/dL   Total Protein 6.2 (L) 6.5 - 8.1 g/dL   Albumin 3.4 (L) 3.5 - 5.0  g/dL   AST 12 (L) 15 - 41 U/L   ALT 11 0 - 44 U/L   Alkaline Phosphatase 70 38 - 126 U/L   Total Bilirubin 0.4 0.3 - 1.2 mg/dL   GFR, Estimated 2 (L) >60 mL/min    Comment: (NOTE) Calculated using the CKD-EPI Creatinine Equation (2021)    Anion gap 22 (H) 5 - 15    Comment: Electrolytes repeated to confirm. Performed at [facility], [place] [address], [place], [place] [address]   CBC with Differential     Status: Abnormal   Collection Time: 11/30/21  2:30 AM  Result Value Ref Range   WBC 3.8 (L) 4.0 - 10.5 K/uL   RBC 2.97 (L) 3.87 - 5.11 MIL/uL   Hemoglobin 9.1 (L) 12.0 - 15.0 g/dL   HCT 27.4 (L) 36.0 - 46.0 %   MCV 92.3 80.0 - 100.0 fL   MCH 30.6 26.0 - 34.0 pg   MCHC 33.2 30.0 - 36.0 g/dL   RDW 15.3 11.5 - 15.5 %   Platelets 159 150 - 400 K/uL    nRBC 0.0 0.0 - 0.2 %   Neutrophils Relative % 53 %   Neutro Abs 2.0 1.7 - 7.7 K/uL   Lymphocytes Relative 25 %   Lymphs Abs 1.0 0.7 - 4.0 K/uL   Monocytes Relative 13 %   Monocytes Absolute 0.5 0.1 - 1.0 K/uL   Eosinophils Relative 7 %   Eosinophils Absolute 0.3 0.0 - 0.5 K/uL   Basophils Relative 2 %   Basophils Absolute 0.1 0.0 - 0.1 K/uL   Immature Granulocytes 0 %   Abs Immature Granulocytes 0.01 0.00 - 0.07 K/uL    Comment: Performed at [facility] [address] [place], [place] [address]  Urinalysis, Routine w reflex microscopic Urine, Clean Catch     Status: Abnormal   Collection Time: 11/30/21  5:45 AM  Result Value Ref Range   Color, Urine YELLOW YELLOW   APPearance HAZY (A) CLEAR   Specific Gravity, Urine 1.012 1.005 - 1.030   pH 7.0 5.0 - 8.0   Glucose, UA NEGATIVE NEGATIVE mg/dL   Hgb urine dipstick SMALL (A) NEGATIVE   Bilirubin Urine NEGATIVE NEGATIVE   Ketones, ur NEGATIVE NEGATIVE mg/dL   Protein, ur 100 (A) NEGATIVE mg/dL   Nitrite NEGATIVE NEGATIVE   Leukocytes,Ua SMALL (A) NEGATIVE   RBC / HPF 0-5 0 - 5 RBC/hpf   WBC, UA 11-20 0 - 5 WBC/hpf   Bacteria, UA RARE (A) NONE SEEN   Squamous Epithelial / LPF 6-10 0 - 5    Comment: Performed at [facility], [address] [place], [place] [address]    No results found.  Assessment/Plan **ESRD on HD:  multiple missed dialysis sessions due to travel now with hyperK, acidosis, volume overload.  Will provide HD here today - low DFR, 3hr tx to prevent dialylsis disequilibrium.  Encouraged her to attend her TIW treatments and make plans if out of town.  D/w ED - would plan for return to ED for discharge following HD today.    **h/o SVC syndrome:  says had tx at [facility] previously.  See recent imaging from [facility] 8/15.  Her symptoms today are not severe and I think some of this is just volume overload from missed dialysis.  I think it's very reasonable for her to f/u with outpt providers for ongoing care.  Discussing with IR and ED here.    **Anemia:  Hb 9.1, acceptable.  Defer ESA to outpt unless she's to be admitted.  **hyperkalemia:  dialysis  **AGMA: secondary to missed dialysis - dialyze.   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   Contact me with issues please.   Justin Mend 11/30/2021, 11:06 AM

## 2021-11-30 NOTE — Progress Notes (Addendum)
Pt fully awake requesting food and drink, peanut butter crackers and juice given

## 2021-11-30 NOTE — Progress Notes (Addendum)
Received patient in bed to unit.  Alert and oriented.  Informed consent signed and in chart.   Treatment initiated: 1259 Treatment completed: 1610  Patient tolerated well. Decreased BFR from 400 to 350 d/t elevated a/v pressure alarms. Heparin 1000 units given d/t elevated tmp  Transported back to the ED Alert, without acute distress.  Hand-off given to patient's nurse.   Access used: fistula right arm  Access issues: yes. Elevated a/v pressure alarms  Total UF removed: 4.1 liters Medication(s) given: phenergan, dilaudid, heparin 1000 units Post HD VS: 119/84 MAP 94 HR 85 RR 20 Sat 100% on room air Temp oral 97.9 Post HD weight: 83.7 kg stand wt   [facility]

## 2021-11-30 NOTE — ED Notes (Signed)
DC instructions reviewed with pt. Pharmacy for scrip changed in system and written on paperwork.  Pt verbalized understanding. PT DC.

## 2021-11-30 NOTE — Progress Notes (Signed)
This rn aroused pt to administer dilaudid 0.5 mg, pt observed med administered thru venous hd blood line, pt comments "I did not feel it", assured pt med was administered without difficulty. Hd tech remains at bedside

## 2021-11-30 NOTE — Progress Notes (Signed)
This rn updated pt nursing awaiting nausea med from pharmacy and awaiting md orders for pain med. Pt dozing off and on. Hd tech at bedside

## 2021-11-30 NOTE — Progress Notes (Addendum)
This rn, Camera operator and hd tech at bedside. Pt occupied using lap top computer, pt reports 9/10 generalized abdominal pain and nausea. Pt with apparent hiccups and belching. Pt comments somewhat quick short and abrupt commenting she did not receive medication in ED for nausea or pain, pt denies hx of GI problems or constipation. Pt making statements she "wants to be seen by a different doctor and "you can't trust doctors and nurses". Charge nurse offered pt oxycodone scheduled prn, pt comments she is allergic to oxycodone and all medications related to oxycodone, charge nurse informs pt she will notify ED md of her request for pain and nausea med, pt conts to remain occupied on lap top computer throughout interaction with staff.

## 2021-11-30 NOTE — ED Provider Notes (Signed)
Clinical Course as of 11/30/21 1636  Tue Nov 30, 2021  0703 HD then DC [CC]  1141 Discussed with multiple subspecialty providers.  Patient was seen by nephrology today and does need dialysis.  She is frustrated by her ongoing neck swelling.  Nephrology communicated with interventional radiology who recommended outpatient follow-up for chronic SVC syndrome.  She is ambulatory tolerating p.o. intake and has no obvious airway obstruction.  I believe the patient is stable for outpatient care and management of her known SVC syndrome in the setting of central lines in the past. [CC]    Clinical Course User Index [CC] Tretha Sciara, MD   Patient to go to dialysis.  She is otherwise in no acute distress.  She would likely be stable for discharge after returning from dialysis as her symptoms are relatively mild.  Nephrology in agreement.  Plan for patient to follow-up her chronic SVC syndrome in the outpatient setting   Tretha Sciara, MD 11/30/21 1636

## 2021-12-01 LAB — HEPATITIS B SURFACE ANTIBODY, QUANTITATIVE: Hep B S AB Quant (Post): 125.1 m[IU]/mL (ref >9.9)

## 2021-12-20 NOTE — ED Provider Notes (Signed)
Formatting of this note is different from the original.  HPI  Meredith Hawkins is a 36 y.o. female who presents to the ED for throat swelling and neck swelling.  She has been seen multiple times for this recently with unremarkable work-up in [place].  She just left [facility] to come here for second opinion.  No fever.    Review of patient's allergies indicates:   Allergen Reactions    Ace inhibitors Potential for Anaphylaxis, Anaphylaxis and Rash    Acetaminophen Anaphylaxis and Shortness Of Breath    Codeine Anaphylaxis and Rash    Fentanyl Anaphylaxis and Rash     Tolerates dilaudid    Nsaids (non-steroidal anti-inflammatory drug) Anaphylaxis, Shortness Of Breath, Rash and Other (See Comments)     Renal doctor told her not to take    Tolerated ibuprofen during admission May 2018    Lupus patient; states can take phenergan   Not an actual allergy, pt cannot take as a dialysis pt    Oxycodone-acetaminophen Anaphylaxis and Shortness Of Breath    Sulfa (sulfonamide antibiotics) Potential for Anaphylaxis, Anaphylaxis, Shortness Of Breath and Rash     Other reaction(s): Anaphylaxis    Sulfamethoxazole-trimethoprim Anaphylaxis and Rash     Past medical history:I have reviewed and confirmed the past medical history in the chart.  Medications: reviewed medication list in the chart and none  Allergies: reviewed allergy section in the chart  Review of Systems: Negative for chest pain and shortness of breath  Fever: no  Constitutional signs: no  Review of all other systems is negative    Physical Exam  Vitals:    12/20/21 2232   BP: (!) 178/103   Pulse: 80   Resp: 16   Temp: 36.8 C (98.3 F)   SpO2: 98%   Weight: 77.5 kg (170 lb 13.7 oz)   Height: 170.2 cm ('5\' 7"'$ )     General: Vital Signs Reviewed. No acute Distress. Oriented to person, place, and time. Appears well-developed and well-nourished.   HEENT: Normocephalic, atraumatic. Pupils PERRL. Conjunctivae and EOM normal. Oropharynx without erythema, swelling,  or exudate.  Neck: Neck supple. Full ROM.  Resp/Chest: Normal effort  Neuro: AAO to person, place, time.   Musculoskeletal:  Normal joint range of motion.  SKIN: Skin is warm, dry, normal color. No rash, not diaphoretic.     Results  Labs:    No data to display     Radiology:    No results found for this or any previous visit (from the past 168 hour(s)).    EKG:    Medical Decision Making    36 year old female presents with reported neck swelling.  No signs of airway, minus this time.  She has been seen for this multiple times based on reviewing her records.  Unremarkable work-up.  We will try a course of steroids and Benadryl.    ED Course    Medications   diphenhydrAMINE (Benadryl) tablet 25 mg (has no administration in time range)   predniSONE (Deltasone) tablet 20 mg (has no administration in time range)           Impression      ICD-10-CM ICD-9-CM    1. Encounter for medical screening examination  Z13.9 V82.9 predniSONE (Deltasone) 50 mg tablet     Dispo    Discharge    Patient will be discharged from the hospital at this time. Patient understands evaluation of their condition in ED.  Instructions given for when to return to  ED and the patient voiced understanding.   Reviewed warning signs and symptoms to return to ED.   All questions answered and agrees with plan for discharge and follow up.        Gilda Crease, MD  12/20/21 2243    Electronically signed by Gilda Crease, MD at 12/20/2021 10:43 PM EDT

## 2021-12-23 NOTE — ED Provider Notes (Signed)
Formatting of this note is different from the original.  HPI:     Chief Complaint   Patient presents with    Abdominal Pain    Vomiting    Nausea    Neck Pain    Leg Pain    Flank Pain    Fall    Leg Swelling     HPI  Patient is a 36 year old female with history of lupus nephritis end-stage renal disease on hemodialysis.  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  She reports she stopped at multiple emergency departments requesting dialysis but none of them have been able to treat her because she did not meet their criteria.  Patient arrives today complaining of neck pain leg pain flank pain and stabbing pain x2 days.  She is also having persistent nausea and vomiting.  She reports she has a complex medical history with previous port and AV fistula on the same side.  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  She is also complaining of some blood in her stools.  She still makes urine.  Reports swelling has worsened over the last day.  She did report that she fell and hit her head yesterday but did not have any loss of consciousness.  She normally gets dialysis Monday Wednesdays and Fridays.  Her last dialysis was last week Wednesday.  She reports multiple drug allergies and states the only pain medication she can take is Dilaudid.  Also request for Phenergan for nausea and vomiting.  Reports she has been unable to keep down any of her blood pressure medications due to the nausea and vomiting.  Denies any history of drug use tobacco use or alcohol abuse.              Glasgow Coma Scale Score: 15           Patient History:     No past medical history on file.    No past surgical history on file.    No family history on file.    Social History     Tobacco Use    Smoking status: Not on file    Smokeless tobacco: Not on  file   Substance Use Topics    Alcohol use: Not on file    Drug use: Not on file      Review of Systems:     Review of Systems   Constitutional:  Negative for chills and fever.   HENT:  Negative for ear pain and sore throat.    Eyes:  Negative for pain and visual disturbance.   Respiratory:  Negative for cough and shortness of breath.    Cardiovascular:  Positive for leg swelling. Negative for chest pain and palpitations.   Gastrointestinal:  Positive for abdominal pain, blood in stool, nausea and vomiting.   Genitourinary:  Positive for flank pain. Negative for dysuria and hematuria.   Musculoskeletal:  Positive for neck pain. Negative for arthralgias and back pain.   Skin:  Negative for color change and rash.   Neurological:  Negative for seizures and syncope.   All other systems reviewed and are negative.     Physical Exam:     ED Triage Vitals [12/23/21 2004]   Temp Heart Rate Resp BP   36.7 C (98  F) 80 15 (!) 181/108     SpO2 Temp Source Heart Rate Source Patient Position   100 % Oral Monitor Sitting     BP Location FiO2 (%)     Left arm --       Physical Exam  Vitals and nursing note reviewed.   Constitutional:       General: She is not in acute distress.     Appearance: She is well-developed. She is ill-appearing (chronically).   HENT:      Head: Normocephalic and atraumatic.      Mouth/Throat:      Dentition: Abnormal dentition.   Eyes:      Conjunctiva/sclera: Conjunctivae normal.   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      Pulses:           Radial pulses are 2+ on the right side and 2+ on the left side.        Dorsalis pedis pulses are 2+ on the right side and 2+ on the left side.      Heart sounds: Murmur (patient reports history of VSD) heard.      Arteriovenous access: Right arteriovenous access is present.     Comments: Good pulse/thrill right Bicep AV fistula with overlying pseudoaneursym    Pulmonary:      Effort: Pulmonary effort is normal. No respiratory distress.      Breath sounds: Normal breath sounds.   Abdominal:      General: Abdomen is protuberant.      Palpations: Abdomen is soft.      Tenderness: There is no abdominal tenderness. There is no guarding or rebound.   Musculoskeletal:         General: No swelling.      Cervical back: Neck supple.      Right lower leg: Edema present.      Left lower leg: Edema present.   Skin:     General: Skin is warm and dry.      Capillary Refill: Capillary refill takes less than 2 seconds.   Neurological:      Mental Status: She is alert.   Psychiatric:         Mood and Affect: Mood normal.      ED Course & MDM     ED Course as of 12/24/21 0351   Thu Dec 23, 2021   2034 MSE initiated in triage.  36 year old female with history of lupus, ESRD on dialysis and SVC syndrome presents to the ED complaining of abdominal pain, nausea, vomiting, and leg swelling over the past 2 days.  Patient reports pain across her entire abdomen and both flanks.  She reports that she has missed multiple dialysis sessions and has not been dialyzed since last Wednesday.  She does report small amounts of blood in her emesis yesterday but none today.  She endorses a fall yesterday in which she hit her head but did not lose consciousness.  She denies being on anticoagulation.  On examination, patient's abdomen is soft and nontender.  She has distended neck veins due to her history of SVC syndrome but her airway is patent and there is no stridor or increased work of breathing.  Labs, EKG, and Phenergan ordered in triage. [DE]   Fri Dec 24, 2021   0224 [place] IV placed at bedside   [KW]  [id number] X-ray Chest 1 View  FINDINGS:  Mild cardiomegaly. Borderline vascular congestion without overt edema. Interval  placement of graft material in the brachiocephalic/SVC    IMPRESSION:  Borderline vascular congestion [KW]   0228 ECG 12 lead  EKG my interpretation sinus rhythm with a rate of 78.  Prolonged  PR interval consistent with first-degree AV block.  Normal QTc interval.  No ST segment changes concerning for acute ischemia.  Mild peaking of T waves in V2 and V3 but no previous for comparison. [KW]   0232 Potassium(!!): 6.4  HyperK treatment ordered [KW]   0245 Spoke with nephrologist Dr. Selinda Orion who will place patient on dialysis schedule.  [KW]   [id number] Glucose: bgl- 35 mg/dl  Patient asymptomatic given glucose tablet [KW]     ED Course User Index  [DE] Sheran Lawless, MD  [KW] Doreen Salvage, DO       Clinical Impressions as of 12/24/21 0351   ESRD on hemodialysis (CMS/HCC) (HCC) (CMS/HCC)   Hyperkalemia   Hypoglycemia   SVC syndrome   Primary hypertension     Vital Signs  Temp Source: Oral (12/24/2021  1:40 AM)  Heart Rate: 78 (12/24/2021  1:40 AM)  Heart Rate Source: Monitor (12/24/2021  1:40 AM)  Resp: 17 (12/24/2021  1:40 AM)  Resp Rate Source: Monitor (12/24/2021  1:40 AM)  BP: (!) 191/121 (12/24/2021  1:40 AM)  BP MAP: 144 (12/24/2021  1:40 AM)  BP Location: Left arm (12/24/2021  1:40 AM)  BP Method: Automatic (12/24/2021  1:40 AM)  Patient Position: Sitting (12/24/2021  1:40 AM)  Level of Consciousness-NURSES ONLY: Alert (12/24/2021  1:41 AM)  Patient Refused Vitals?: No (12/24/2021  1:40 AM)    Medical Decision Making  Patient is a 36 year old female with history and presentation above.  Patient's presentation with multiple complaints.  Will evaluate with differential diagnosis include but not limited to fluid overload secondary to end-stage renal disease, gastroenteritis, gastritis, pancreatitis, pyelonephritis, urinary tract infection, musculoskeletal pain, nephrolithiasis.  Appropriate labs and imaging ordered.  Labs are consistent with end-stage renal disease with hyperkalemia noted 6.4.  Mild peaking of T waves but no recent for comparison.  Patient given hyperkalemia protocol.  Patient also severely hypertensive also likely secondary to missed dialysis and unable to tolerate p.o. to take blood pressure  medications nephrology consulted and will dialysis patient.  Patient does have distended jugular veins likely secondary to fluid overload as well as increasing peripheral edema is likely sick of the same.  No respiratory distress noted.  Chest x-ray does show mild pulmonary edema.  Patient also noted to have hypoglycemia that was treated with glucose tablets and food.  Patient minimally symptomatic at those times.  Hypoglycemia likely secondary to poor p.o. intake in the setting of persistent nausea and vomiting.  Given patient's presentation will admit for further work-up and management of her symptoms.    Amount and/or Complexity of Data Reviewed  Labs: ordered. Decision-making details documented in ED Course.  Radiology: ordered. Decision-making details documented in ED Course.  ECG/medicine tests:  Decision-making details documented in ED Course.    Risk  Prescription drug management.  Decision regarding hospitalization.          Doreen Salvage, DO  Resident  12/24/21 [place], DO  Resident  12/24/21 [place], DO  Resident  12/24/21 [id number]    Electronically signed by Renold Genta, MD at 12/29/2021  7:03 AM  EDT    Associated attestation - Renold Genta, MD - 12/29/2021  7:03 AM EDT  Formatting of this note might be different from the original.  ED Attending Attestation    This patient was examined, evaluated, and dispositioned by the resident physician. I physically examined and interviewed the patient. I discussed the evaluation, workup, and treatment plan of the patient directly with the resident physician and agree with associated documentation, plan, and disposition.

## 2021-12-23 NOTE — ED Triage Notes (Signed)
Formatting of this note might be different from the original.  Pt A+Ox4 with C/O neck pain, leg pain, flank pain and stomach pain x 2 days. She has been having nausea and vomiting x 2 days with minimal blood. She also C/O minimal blood in her stool since 2 days ago. Swelling to legs and neck since yesterday. [place] and hit her head yesterday. Had no LOC. She does dialysis M/W/F. Her last dialysis was last week WED. H/O ESRD, HTN.  Electronically signed by Merrily Brittle, RN at 12/24/2021 12:44 AM EDT

## 2021-12-24 NOTE — Discharge Summary (Signed)
Formatting of this note might be different from the original.  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  Patient Condition: Stable.    Hospital Course:  A 36 years old African-American female with obesity, ESRD on hemodialysis via right arm AV fistula, SVC syndrome, hypertension who presented with lightheadedness, nausea after missing dialysis.  She was found to have severe hypertension and hyperkalemia.  These were due to missing dialysis.  Nephrology took the patient for dialysis with improvement of her symptoms.  Subsequently she was discharged home in stable condition without oxygen supplement.  Dialysis compliance encouraged.  Her dialysis center is in [place], [place].  Of note; patient had multiple recent ED visits.  In September 2023, it was 11 times for various complaints.  Since October, 3 times already.    #ESRD  Dialysis per nephrology  Encouraged compliant  Continued calcitriol, phoslo    #Hypertension  Resumed home losartan, metoprolol, amlodipine    Pertinent Physical Exam At Time of Discharge:  Physical Exam  General Appearance: Obese lady, Not in Acute Distress.   Head/Ear/Nose/Throat: Normocephalic; Atraumatic.  On room air.  Respiratory: CTA B/L. No wheezing.  Cardiovascular: RRR; Normal S1, S2.   Gastrointestinal: Soft; Not tender; +BS.   Extremities: No Cyanosis, Clubbing or Edema.    Musculoskeletal: No Injury or Deformity.  Right arm AV fistula with good thrill.  Neurological: Awake alert.  Behavior/Emotional: Appropriate; Cooperative.     Outpatient Follow-Up:  PCP 1 week.    35 minutes used coordinating this discharge    Electronically signed by Hansel Feinstein, MD at 12/24/2021  3:27 PM EDT

## 2021-12-24 NOTE — ED Triage Notes (Signed)
Formatting of this note might be different from the original.  Although pt made aware of the risks of refusing the medication - she is refusing to take  Electronically signed by Janene Madeira, RN at 12/24/2021 12:48 AM EDT

## 2021-12-24 NOTE — Nursing Note (Signed)
Formatting of this note might be different from the original.  Pt requested to speak to a supervisor. House nursing supervisor called and patient relations notified. Patient relations arrived to room but pt was in the bathroom. Pt relation waited for 10-15 min pt never came out of the restroom so she left. House supervisor called and arrived to bedside to speak with the pt. Pt stated she wanted to stay for dinner, pt made aware that she has been discharged, and writer wanted t make sure she spoke to someone before she left to hear her grievances. IV and tele removed. Pt received and reviewed discharge instructions.   Electronically signed by Simonne Come, LPN at [phone]  075-GRM PM EDT

## 2021-12-24 NOTE — Nursing Note (Signed)
Formatting of this note might be different from the original.  Pt signed of tx AMA. Nephrologist notified. She received approximately 3 hrs and 10 min tx. Removed 2580 ml. Pt received pain and nausea medications during tx. Post VSS. NAD noted. Pt is able to reposition self. Pt's status is unchanged. Pt's condition is stable to return to floor.        Electronically signed by Lake Bells, RN at 12/24/2021 11:22 AM EDT

## 2021-12-24 NOTE — Consults (Signed)
Formatting of this note is different from the original.  Reason for Consult:  ESRD on Dialysis    History Of Present Illness:  Meredith Hawkins is a 36 y.o. 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 She was also seen at [facility] in [place] on 9/30 and diagnosed with pyelonephritis based on positive urinalysis and flank pain. She was discharged with a 10 day course of 500 mg ciprofloxacin to treat. She denies any dysuria, but does not urinary frequency over the past few weeks. On admission she was found to have potassium of 6.4 and a creatinine of 15.14, baseline ~8 and was given calcium gluconate, insulin with glucose and lokelma. Nephrology was consulted for dialysis.    Past Medical History:  She has a past medical history of ESRD (end stage renal disease) (CMS/HCC) and Lupus (CMS/HCC) (HCC) (CMS/HCC).    Surgical History:  She has no past surgical history on file.    Social History:  She has no history on file for tobacco use, alcohol use, and drug use.    Allergies:  Ace inhibitors, Acetaminophen, Codeine, Fentanyl, Hydrocodone, Oxycodone, Sulfa (sulfonamide antibiotics), Nsaids (non-steroidal anti-inflammatory drug), and Zofran [ondansetron hcl]    Medications:  Medications Prior to Admission   Medication Sig Dispense Refill Last Dose    amLODIPine (NORVASC) 10 mg tablet Take ONE tablet by mouth 1 (one) time each day.       apixaban (ELIQUIS) 5 mg tablet Take ONE tablet by mouth every 12 (twelve) hours.       calcitrioL (ROCALTROL) 0.25 mcg capsule Take ONE capsule by mouth 3 (three) times a week.       losartan (COZAAR) 50 mg tablet Take ONE tablet by mouth 1 (one) time each day.       metoprolol  succinate (TOPROL-XL) 25 mg 24 hr tablet Take ONE tablet by mouth 1 (one) time each day.        Review of Systems   Constitutional:  Positive for appetite change (decreased) and fatigue. Negative for fever and unexpected weight change.   Respiratory:  Negative for chest tightness and shortness of breath.    Cardiovascular:  Negative for chest pain, palpitations and leg swelling.   Gastrointestinal:  Positive for nausea and vomiting. Negative for abdominal distention, abdominal pain, constipation and diarrhea.   Genitourinary:  Positive for frequency. Negative for difficulty urinating, dysuria, flank pain, hematuria and urgency.   Musculoskeletal:  Negative for back pain.   Skin: Negative.    Neurological:  Positive for light-headedness. Negative for dizziness and weakness.   :    Physical Exam  Vitals reviewed.   Constitutional:       General: She is not in acute distress.     Appearance: Normal appearance. She is ill-appearing. She is not toxic-appearing.   HENT:      Head: Normocephalic and atraumatic.      Mouth/Throat:      Mouth: Mucous membranes are moist.      Pharynx: Oropharynx is clear. No oropharyngeal exudate or posterior oropharyngeal erythema.   Eyes:      Extraocular Movements: Extraocular movements intact.      Pupils: Pupils are equal, round, and reactive  to light.   Cardiovascular:      Rate and Rhythm: Normal rate and regular rhythm.      Pulses: Normal pulses.      Heart sounds: Normal heart sounds. No murmur heard.     No friction rub. No gallop.   Pulmonary:      Effort: Pulmonary effort is normal. No respiratory distress.      Breath sounds: No wheezing, rhonchi or rales.   Abdominal:      General: Bowel sounds are normal. There is no distension.      Palpations: Abdomen is soft. There is no mass.      Tenderness: There is no abdominal tenderness. There is no right CVA tenderness or left CVA tenderness.      Hernia: No hernia is present.   Musculoskeletal:         General: No tenderness.  Normal range of motion.      Cervical back: Normal range of motion and neck supple.      Right lower leg: Edema (1+ pitting edema up to mid calf) present.      Left lower leg: Edema (1+ pitting edema up to mid calf) present.   Skin:     General: Skin is warm and dry.   Neurological:      General: No focal deficit present.      Mental Status: She is alert and oriented to person, place, and time.   Psychiatric:         Mood and Affect: Mood normal.         Behavior: Behavior normal.     :    Last Recorded Vitals:   Blood pressure (!) 182/111, pulse 88, temperature 36.4 C (97.6 F), temperature source Infrared Forehead, resp. rate 15, height 1.676 m (5' 6"$ ), SpO2 100 %.    Relevant Results:  Recent Results (from the past 24 hour(s))   ECG 12 lead    Collection Time: 12/23/21  8:51 PM   Result Value Ref Range    Heart Rate 78 bpm    RR Interval 769 ms    Atrial Rate 77 ms    P-R Interval 218 ms    P Duration 136 ms    P Horizontal Axis 10 deg    P Front Axis 46 deg    Q Onset 510 ms    QRSD Interval 97 ms    QT Interval 405 ms    QTcB 462 ms    QTcF 442 ms    QRS Horizontal Axis -36 deg    QRS Axis 34 deg    I-40 Horizontal Axis 5 deg    I-40 Front Axis 29 deg    T-40 Horizontal Axis -78 deg    T-40 Front Axis -4 deg    T Horizontal Axis 42 deg    T Wave Axis 60 deg    S-T Horizontal Axis 74 deg    S-T Front Axis 76 deg   CBC    Collection Time: 12/23/21  9:53 PM   Result Value Ref Range    WBC  4.9 4.5 - 12.5 x10*3/uL    RBC 3.51 (L) 4.20 - 5.40 x10E6/uL    Hemoglobin 11.0 (L) 12.0 - 16.0 g/dL    Hematocrit 33.2 (L) 36.0 - 48.0 %    MCV 94.6 81.0 - 99.0 fL    MCH 31.3 (H) 27.0 - 31.0 pg    MCHC 33.1 31.0 - 36.0 g/dL    RDWSD 60.1 (  H) 36.4 - 46.3 fL    Platelets 140 (L) 150 - 450 x10*3/uL    MPV 10.3 7.4 - 10.4 fL    RDWCV 17.7 (H) 11.7 - 14.4 %    nRBC 0.0 %    nRBC Absolute 0.00 0.00 - 0.01 x10*3/uL   Comprehensive Metabolic Panel    Collection Time: 12/23/21  9:53 PM   Result Value Ref Range    Sodium 137 136 - 145  mmol/L    Potassium 6.4 (HH) 3.4 - 4.9 mmol/L    Chloride 96 (L) 98 - 107 mmol/L    CO2 16 (L) 21 - 32 mmol/L    BUN 92 (H) 7 - 25 mg/dL    Creatinine 15.14 (HH) 0.60 - 1.30 mg/dL    Glucose, Random 63 (L) 74 - 109 mg/dL    Calcium 8.3 (L) 8.6 - 10.2 mg/dL    AST 16 13 - 39 U/L    ALT  7 7 - 52 U/L    Alkaline Phosphatase 84 30 - 105 U/L    Protein Total 8.1 6.4 - 8.9 g/dL    Albumin 4.4 3.5 - 5.7 g/dL    Bilirubin Total 0.4 0.3 - 1.0 mg/dL    eGFR 2.9 (L) >60.0 mL/min/1.41m2    Anion Gap 25 (H) 1 - 11 mmol/L   Lipase    Collection Time: 12/23/21  9:53 PM   Result Value Ref Range    Lipase 22 11 - 82 U/L   Magnesium    Collection Time: 12/23/21  9:53 PM   Result Value Ref Range    Magnesium 2.7 1.9 - 2.7 mg/dL   POCT glucose meter docked device    Collection Time: 12/24/21  2:52 AM   Result Value Ref Range    Glucose bgl- 35 mg/dl    POCT glucose meter    Collection Time: 12/24/21  2:52 AM   Result Value Ref Range    Glucose 35 (LL) 74 - 106 mg/dL   POCT glucose meter    Collection Time: 12/24/21  3:21 AM   Result Value Ref Range    Glucose 49 (LL) 74 - 106 mg/dL   POCT glucose meter    Collection Time: 12/24/21  3:40 AM   Result Value Ref Range    Glucose 60 (L) 74 - 106 mg/dL   POCT glucose meter    Collection Time: 12/24/21  4:56 AM   Result Value Ref Range    Glucose 106 74 - 106 mg/dL   POCT glucose meter docked device    Collection Time: 12/24/21  4:57 AM   Result Value Ref Range    Glucose bgl-106 mg/dl      Assessment/Plan:    #ESRD HD MWF  #Hyperkalemia  Anemia in CKD  Dependence on dialysis   Patient was diagnosed with ESRD in 2016 2/2 lupus nephritis and is currently on dialysis MWF. She missed the past week of dialysis treatments as she was out of town.   -Nausea, vomiting, fatigue likely secondary to uremia in the setting of missed dialysis.  -Hyperkalemia 2/2 missed dialysis, anticipate this will improve with HD.  -Can give calcium gluconate and lokelma if potassium does not improve with  dialysis.  -Hemodialysis today, return to normal schedule at DC.  -Patient cleared for DC after dialysis from a nephrology perspective.    #NAGMA  Normal anion gap with CO2 of 16 on admission.  -Likely pure NAGMA in the setting of hyperkalemia, will improve with  improvement in serum potassium.    I have seen and evaluated this patient and discussed the plan of care with my attending Dr. Selinda Orion. Please see attestation for definitive recommendations.    Charlann Boxer Earleen Newport DO  Internal Medicine, PGY2  Electronically signed by Oneal Deputy, MD at 12/24/2021 11:56 AM EDT

## 2021-12-24 NOTE — Progress Notes (Signed)
Formatting of this note might be different from the original.  Discharge Planning Note    SW spoke with patient and she is requesting she be provided with transportation back  to [place], [place].  SW confirmed with patient that she did not have family/friends to provide transportation, patients reply was '" if I had transportation you think I would be asking you'.      SW reached out to management for authorization.    SW will continue to follow to assist with discharge needs.    Patient provided with a bus pass.     Suezanne Jacquet, MSW    Electronically signed by Dustin Flock, Social Worker II at 12/24/2021  4:22 PM EDT

## 2021-12-24 NOTE — Progress Notes (Signed)
Formatting of this note might be different from the original.  Discharge Note    SW spoke with patient and informed her that she is discharging. Patient asked when would she be discharging; SW informed her once nurse provides discharge paperwork. Patient states that she has not eaten since she arrived to the hospital and she would like a new doctor. SW informed her that a new doctor will probably not be provided as she is up for discharge but SW will ask nursing about receiving a meal.   Electronically signed by Ledora Bottcher, Social Worker II at 12/24/2021  2:44 PM EDT

## 2021-12-24 NOTE — Nursing Note (Signed)
Formatting of this note might be different from the original.  Pt arrived to the floor escorted by [employer] transportation. Pt oriented to room. Pt states she wants a new MD. Charge nurse notified and I will notify MD in MDR at 1300.  Electronically signed by Simonne Come, LPN at [phone] [id number] PM EDT

## 2021-12-24 NOTE — Nursing Note (Signed)
Formatting of this note might be different from the original.  Writer reviewed discharge instructions with pt. Pt packed up all her personal belongings herself. No personal belongings left in room.  Pt left discharge instructions on bed stating we did not do anything for her so she is not taking it.   Electronically signed by Simonne Come, LPN at [phone]  [id number] PM EDT

## 2021-12-24 NOTE — H&P (Signed)
Associated Order(s): ED Admission Consult to Hospitalist  Summary: Patient admitted for missed hemodialysis with hyperkalemia nausea and vomiting    Formatting of this note is different from the original.  Consult:  ED Admission Consult to Hospitalist  Consult performed by: Shirley Friar, MD  Consult ordered by: Renold Genta, MD    CC: Missed hemodialysis with dizziness and lightheadedness    History Of Present Illness:  Meredith Hawkins is a 36 y.o. female presenting with generalized weakness and lightheadedness associated with the fact that she has missed hemodialysis for over a week now.  Patient also reported swelling of the extremities bilaterally and generalized body aches and pains.  Patient reports that she is a traveling [place].  Reports nausea and vomiting but no diarrhea.  Lab work investigations in the ER showed hyperkalemia, 6.4Chest x-ray shows vascular congestion but EKG did not show any changes.  Case discussed with nephrology in the ER we will dialyze patient immediately.  Patient denies any focal weaknesses.    Past Medical History:  She has a past medical history of ESRD (end stage renal disease) (CMS/HCC) and Lupus (CMS/HCC) (HCC) (CMS/HCC).    Surgical History:  She has no past surgical history on file.    Social History:  She has no history on file for tobacco use, alcohol use, and drug use.  Her family history is not on file.    Allergies:  Ace inhibitors, Acetaminophen, Codeine, Fentanyl, Hydrocodone, Oxycodone, Sulfa (sulfonamide antibiotics), Nsaids (non-steroidal anti-inflammatory drug), and Zofran [ondansetron hcl]    Medications:  Prior to Admission medications    Medication Sig Start Date End Date Taking? Authorizing Provider   amLODIPine (NORVASC) 10 mg tablet Take ONE tablet by mouth 1 (one) time each day.    Historical Provider, MD   apixaban (ELIQUIS) 5 mg tablet Take ONE tablet by mouth every 12 (twelve) hours. 04/12/21   Historical Provider, MD   calcitrioL (ROCALTROL) 0.25  mcg capsule Take ONE capsule by mouth 3 (three) times a week. 04/14/21   Historical Provider, MD   losartan (COZAAR) 50 mg tablet Take ONE tablet by mouth 1 (one) time each day. 08/27/21   Historical Provider, MD   metoprolol succinate (TOPROL-XL) 25 mg 24 hr tablet Take ONE tablet by mouth 1 (one) time each day. 06/11/21   Historical Provider, MD       Review of Systems   Constitutional:  Positive for activity change, appetite change and fatigue. Negative for chills, fever and unexpected weight change.   HENT:  Negative for congestion, ear pain, sinus pain and sore throat.    Eyes:  Negative for pain, discharge and redness.   Respiratory:  Negative for cough, chest tightness, shortness of breath and wheezing.    Cardiovascular:  Negative for chest pain, palpitations and leg swelling.   Gastrointestinal:  Negative for abdominal pain, blood in stool, constipation, diarrhea, nausea and vomiting.   Endocrine: Negative for cold intolerance, heat intolerance, polydipsia, polyphagia and polyuria.   Genitourinary:  Negative for decreased urine volume, difficulty urinating, dysuria, flank pain, frequency, hematuria and urgency.   Musculoskeletal:  Negative for back pain, joint swelling, neck pain and neck stiffness.   Skin:  Negative for color change, rash and wound.   Allergic/Immunologic: Negative for immunocompromised state.   Neurological:  Negative for dizziness, tremors, speech difficulty, weakness, light-headedness, numbness and headaches.   Hematological:  Does not bruise/bleed easily.   Psychiatric/Behavioral:  Negative for agitation, confusion, hallucinations, sleep disturbance and suicidal ideas. The  patient is not nervous/anxious.      Last Recorded Vitals:  Blood pressure 135/86, pulse 96, temperature 36.7 C (98.1 F), temperature source Axillary, resp. rate 26, height 1.676 m (5' 6"$ ), SpO2 95 %.    Physical Exam  Constitutional:       General: She is not in acute distress.     Appearance: She is well-developed.  She is ill-appearing. She is not diaphoretic.   HENT:      Head: Normocephalic and atraumatic.      Nose: Nose normal.      Mouth/Throat:      Mouth: Mucous membranes are moist.      Pharynx: Oropharynx is clear.   Eyes:      General: No scleral icterus.     Extraocular Movements: Extraocular movements intact.      Conjunctiva/sclera: Conjunctivae normal.      Pupils: Pupils are equal, round, and reactive to light.   Neck:      Thyroid: No thyromegaly.      Vascular: No JVD.   Cardiovascular:      Rate and Rhythm: Normal rate and regular rhythm.      Pulses: Normal pulses.      Heart sounds: Murmur heard.      No friction rub. No gallop.   Pulmonary:      Effort: Pulmonary effort is normal. No respiratory distress.      Breath sounds: Normal breath sounds. No wheezing or rales.   Chest:      Chest wall: No tenderness.   Abdominal:      General: Bowel sounds are normal. There is no distension.      Palpations: Abdomen is soft. There is no mass.      Tenderness: There is no abdominal tenderness. There is no rebound.   Musculoskeletal:         General: Swelling present. Normal range of motion.      Cervical back: Normal range of motion and neck supple.      Right lower leg: Edema present.      Left lower leg: Edema present.      Comments: Status post right arm HD access   Lymphadenopathy:      Cervical: No cervical adenopathy.   Skin:     General: Skin is warm and dry.      Capillary Refill: Capillary refill takes less than 2 seconds.   Neurological:      General: No focal deficit present.      Mental Status: She is alert and oriented to person, place, and time. Mental status is at baseline.   Psychiatric:         Mood and Affect: Mood normal.     I reviewed the patient's labs, notes, and procedures.     Relevant Results:  I reviewed lab results and outside records for this visit and discussed relevant results with patient and/or family.  Recent Results (from the past 24 hour(s))   ECG 12 lead    Collection Time:  12/23/21  8:51 PM   Result Value Ref Range    Heart Rate 78 bpm    RR Interval 769 ms    Atrial Rate 77 ms    P-R Interval 218 ms    P Duration 136 ms    P Horizontal Axis 10 deg    P Front Axis 46 deg    Q Onset 510 ms    QRSD Interval 97 ms    QT Interval  405 ms    QTcB 462 ms    QTcF 442 ms    QRS Horizontal Axis -36 deg    QRS Axis 34 deg    I-40 Horizontal Axis 5 deg    I-40 Front Axis 29 deg    T-40 Horizontal Axis -78 deg    T-40 Front Axis -4 deg    T Horizontal Axis 42 deg    T Wave Axis 60 deg    S-T Horizontal Axis 74 deg    S-T Front Axis 76 deg   CBC    Collection Time: 12/23/21  9:53 PM   Result Value Ref Range    WBC  4.9 4.5 - 12.5 x10*3/uL    RBC 3.51 (L) 4.20 - 5.40 x10E6/uL    Hemoglobin 11.0 (L) 12.0 - 16.0 g/dL    Hematocrit 33.2 (L) 36.0 - 48.0 %    MCV 94.6 81.0 - 99.0 fL    MCH 31.3 (H) 27.0 - 31.0 pg    MCHC 33.1 31.0 - 36.0 g/dL    RDWSD 60.1 (H) 36.4 - 46.3 fL    Platelets 140 (L) 150 - 450 x10*3/uL    MPV 10.3 7.4 - 10.4 fL    RDWCV 17.7 (H) 11.7 - 14.4 %    nRBC 0.0 %    nRBC Absolute 0.00 0.00 - 0.01 x10*3/uL   Comprehensive Metabolic Panel    Collection Time: 12/23/21  9:53 PM   Result Value Ref Range    Sodium 137 136 - 145 mmol/L    Potassium 6.4 (HH) 3.4 - 4.9 mmol/L    Chloride 96 (L) 98 - 107 mmol/L    CO2 16 (L) 21 - 32 mmol/L    BUN 92 (H) 7 - 25 mg/dL    Creatinine 15.14 (HH) 0.60 - 1.30 mg/dL    Glucose, Random 63 (L) 74 - 109 mg/dL    Calcium 8.3 (L) 8.6 - 10.2 mg/dL    AST 16 13 - 39 U/L    ALT  7 7 - 52 U/L    Alkaline Phosphatase 84 30 - 105 U/L    Protein Total 8.1 6.4 - 8.9 g/dL    Albumin 4.4 3.5 - 5.7 g/dL    Bilirubin Total 0.4 0.3 - 1.0 mg/dL    eGFR 2.9 (L) >60.0 mL/min/1.55m2    Anion Gap 25 (H) 1 - 11 mmol/L   Lipase    Collection Time: 12/23/21  9:53 PM   Result Value Ref Range    Lipase 22 11 - 82 U/L   Magnesium    Collection Time: 12/23/21  9:53 PM   Result Value Ref Range    Magnesium 2.7 1.9 - 2.7 mg/dL   POCT glucose meter docked device    Collection Time:  12/24/21  2:52 AM   Result Value Ref Range    Glucose bgl- 35 mg/dl    POCT glucose meter    Collection Time: 12/24/21  2:52 AM   Result Value Ref Range    Glucose 35 (LL) 74 - 106 mg/dL   POCT glucose meter    Collection Time: 12/24/21  3:21 AM   Result Value Ref Range    Glucose 49 (LL) 74 - 106 mg/dL   POCT glucose meter    Collection Time: 12/24/21  3:40 AM   Result Value Ref Range    Glucose 60 (L) 74 - 106 mg/dL   POCT glucose meter    Collection Time: 12/24/21  4:56 AM  Result Value Ref Range    Glucose 106 74 - 106 mg/dL   POCT glucose meter docked device    Collection Time: 12/24/21  4:57 AM   Result Value Ref Range    Glucose bgl-106 mg/dl      X-ray Chest 1 View  FINDINGS:  Mild cardiomegaly. Borderline vascular congestion without overt edema. Interval  placement of graft material in the brachiocephalic/SVC    IMPRESSION:  Borderline vascular congestion    Assessment/Plan:   #.  End-stage renal disease with missed hemodialysis sessions       Admit to medical floor telemetry       Nephrology consult for hemodialysis    #.  Hyperkalemia secondary to end-stage renal disease: Patient has already received initial treatment with calcium gluconate IV insulin and Lokelma.,  Continue hemodialysis and monitor levels post hemodialysis and treat accordingly.  May need to hold off losartan hyperkalemia is corrected.    #.  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  History of lupus: Continue home medications and follow-up with rheumatology as scheduled.    #.  Nausea with intermittent vomiting: IV promethazine as needed    Continue with all other pre admission medications as appropriate  SCDs  and lovenox for DVT prophylaxis  Protonix for GI prophylaxis  Pt is high risk for decompensation  Expected length of stay more than 2 midnights  Plan of care discussed with patient    Medical Decision Making  Refer to assessment and plan for further details    Amount and/or Complexity of Data  Reviewed  Independent Historian:      Details: Obtained from patient  Labs: ordered.     Details: Labs ordered to follow response to treatment.  Radiology: independent interpretation performed.     Details: Reviewed  ECG/medicine tests: independent interpretation performed.     Details: Reviewed    Risk  Prescription drug management.  Drug therapy requiring intensive monitoring for toxicity.  Decision regarding hospitalization.  Risk Details: Is high risk for decompensation.      Electronically signed by Shirley Friar, MD at 12/24/2021  7:44 AM EDT

## 2022-01-22 ENCOUNTER — Other Ambulatory Visit: Payer: Self-pay

## 2022-01-22 ENCOUNTER — Emergency Department
Admission: EM | Admit: 2022-01-22 | Discharge: 2022-01-22 | Discharge status: Against Medical Advice | Payer: Medicaid Other | Attending: Emergency Medicine | Admitting: Emergency Medicine

## 2022-01-22 DIAGNOSIS — R1010 Upper abdominal pain, unspecified: Secondary | ICD-10-CM | POA: Diagnosis present

## 2022-01-22 DIAGNOSIS — R112 Nausea with vomiting, unspecified: Secondary | ICD-10-CM | POA: Diagnosis not present

## 2022-01-22 DIAGNOSIS — E1122 Type 2 diabetes mellitus with diabetic chronic kidney disease: Secondary | ICD-10-CM | POA: Insufficient documentation

## 2022-01-22 DIAGNOSIS — M25141 Fistula, right hand: Secondary | ICD-10-CM | POA: Insufficient documentation

## 2022-01-22 DIAGNOSIS — I120 Hypertensive chronic kidney disease with stage 5 chronic kidney disease or end stage renal disease: Secondary | ICD-10-CM | POA: Diagnosis not present

## 2022-01-22 DIAGNOSIS — Z5329 Procedure and treatment not carried out because of patient's decision for other reasons: Secondary | ICD-10-CM | POA: Diagnosis not present

## 2022-01-22 DIAGNOSIS — N186 End stage renal disease: Secondary | ICD-10-CM | POA: Insufficient documentation

## 2022-01-22 DIAGNOSIS — R0789 Other chest pain: Secondary | ICD-10-CM | POA: Insufficient documentation

## 2022-01-22 DIAGNOSIS — Z992 Dependence on renal dialysis: Secondary | ICD-10-CM | POA: Diagnosis not present

## 2022-01-22 DIAGNOSIS — R109 Unspecified abdominal pain: Secondary | ICD-10-CM

## 2022-01-22 DIAGNOSIS — M542 Cervicalgia: Secondary | ICD-10-CM | POA: Insufficient documentation

## 2022-01-22 LAB — COMPREHENSIVE METABOLIC PANEL
ALT: 10 U/L (ref 0–44)
AST: 14 U/L — ABNORMAL LOW (ref 15–41)
Albumin: 3.8 g/dL (ref 3.5–5.0)
Alkaline Phosphatase: 106 U/L (ref 38–126)
Anion gap: 16 — ABNORMAL HIGH (ref 5–15)
BUN: 74 mg/dL — ABNORMAL HIGH (ref 6–20)
CO2: 23 mmol/L (ref 22–32)
Calcium: 8.5 mg/dL — ABNORMAL LOW (ref 8.9–10.3)
Chloride: 102 mmol/L (ref 98–111)
Creatinine, Ser: 8.21 mg/dL — ABNORMAL HIGH (ref 0.44–1.00)
GFR, Estimated: 6 mL/min — ABNORMAL LOW (ref >60)
Glucose, Bld: 86 mg/dL (ref 70–99)
Potassium: 4.3 mmol/L (ref 3.5–5.1)
Sodium: 141 mmol/L (ref 135–145)
Total Bilirubin: 0.6 mg/dL (ref 0.3–1.2)
Total Protein: 7.8 g/dL (ref 6.5–8.1)

## 2022-01-22 LAB — CBC
HCT: 31.2 % — ABNORMAL LOW (ref 36.0–46.0)
Hemoglobin: 10.3 g/dL — ABNORMAL LOW (ref 12.0–15.0)
MCH: 30.7 pg (ref 26.0–34.0)
MCHC: 33 g/dL (ref 30.0–36.0)
MCV: 92.9 fL (ref 80.0–100.0)
Platelets: 219 10*3/uL (ref 150–400)
RBC: 3.36 MIL/uL — ABNORMAL LOW (ref 3.87–5.11)
RDW: 15.4 % (ref 11.5–15.5)
WBC: 4 10*3/uL (ref 4.0–10.5)
nRBC: 0 % (ref 0.0–0.2)

## 2022-01-22 LAB — LIPASE, BLOOD: Lipase: 73 U/L — ABNORMAL HIGH (ref 11–51)

## 2022-01-22 LAB — HCG, QUANTITATIVE, PREGNANCY: hCG, Beta Chain, Quant, S: 3 m[IU]/mL (ref <5)

## 2022-01-22 NOTE — ED Triage Notes (Signed)
Formatting of this note might be different from the original.  Pt arrives with c/o bilateral flank pain that started Wednesday. Pt endorses n/v and ABD pain as well. Pt also c/o neck swelling. Pt is HD pt and attends HD MWF. Per pt, she has not missed any treatments.   Electronically signed by Cephus Slater, RN at 01/22/2022  8:46 PM EDT

## 2022-01-22 NOTE — ED Provider Notes (Signed)
Formatting of this note is different from the original.  Images from the original note were not included.      [facility]  Provider Note     Event Date/Time    First MD Initiated Contact with Patient 01/22/22 2155      (approximate)    History     Flank Pain    HPI    Meredith Hawkins is a 36 y.o. female who on review of a nephrology consult from September of this year has a history of lupus, end-stage renal disease, hypertension diabetes, and supervior vena cava syndrome    Patient reports been having several days of flank pain and a feeling of fullness across her upper abdomen with nausea and vomiting.  She is also felt like she is having swelling in her lower neck and upper chest area as well.  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    She denies shortness of breath.  She is not having chest pain but rather reports a feeling of fullness in the neck and upper chest. she had hemodialysis yesterday and her next scheduled appointment is Monday.    No fevers or chills.  Reports a feeling of fullness across the upper chest and also discomfort into her bilateral flanks.    Patient reports that she is allergic to morphine and fentanyl.  She reports that she can be treated with Dilaudid also needs medication for itching.      Physical Exam     Triage Vital Signs:  ED Triage Vitals   Enc Vitals Group      BP 01/22/22 2046 (!) 146/99      Pulse Rate 01/22/22 2046 85      Resp 01/22/22 2046 18      Temp 01/22/22 2046 98.4 F (36.9 C)      Temp Source 01/22/22 2046 Oral      SpO2 01/22/22 2046 96 %      Weight 01/22/22 2038 170 lb 13.7 oz (77.5 kg)      Height --       Head Circumference --       Peak Flow --       Pain Score 01/22/22 2038 9      Pain Loc --       Pain Edu? --       Excl. in [place]? --      Most recent vital signs:  Vitals:    01/22/22 2046   BP: (!) 146/99   Pulse: 85   Resp: 18   Temp: 98.4 F  (36.9 C)   SpO2: 96%     General: Awake, no distress.  She does not appear to be in any acute extremis at this time.  She does not appear to be in any obvious acute pain.  Patient does seem to have some mild fullness and increased venous distention of the veins around her lower neck and anterior upper chest wall.  There is no erythema or tenderness. Fistula in R upper extremity without notable edema in the arm.  CV:  Good peripheral perfusion.  Normal heart tones.  Resp:  Normal effort.  Normal respiratory effort.  Lung sounds are clear with normal oxygen saturation.  Abd:  No distention.  Soft nontender  Other:  Fistula in the right upper extremity    ED Results / Procedures / Treatments  Labs  (all labs ordered are listed, but only abnormal results are displayed)  Labs Reviewed   LIPASE, BLOOD - Abnormal; Notable for the following components:       Result Value    Lipase 73 (*)     All other components within normal limits   COMPREHENSIVE METABOLIC PANEL - Abnormal; Notable for the following components:    BUN 74 (*)     Creatinine, Ser 8.21 (*)     Calcium 8.5 (*)     AST 14 (*)     GFR, Estimated 6 (*)     Anion gap 16 (*)     All other components within normal limits   CBC - Abnormal; Notable for the following components:    RBC 3.36 (*)     Hemoglobin 10.3 (*)     HCT 31.2 (*)     All other components within normal limits   HCG, QUANTITATIVE, PREGNANCY   URINALYSIS, ROUTINE W REFLEX MICROSCOPIC   POC URINE PREG, ED     PROCEDURES:    Critical Care performed: No    Procedures    MEDICATIONS ORDERED IN ED:  Medications - No data to display    IMPRESSION / MDM / [place] / ED COURSE   I reviewed the triage vital signs and the nursing notes.        Differential diagnosis includes, but is not limited to, potential volume overload, recurrent SVC syndrome, pulmonary edema, ACS, coronary artery disease, etc.  Based on the patient's concern of a feeling of recurrent fullness in her neck and upper chest  which she reports in the past has been a symptom of her previous superior vena cava syndrome I think it would be prudent to evaluate and work this up.     Patient's presentation is most consistent with acute complicated illness / injury requiring diagnostic workup.    I reviewed the patient's recent medical records, and of note find that the patient has had 3 recent ER visits 1 at [place], 1 at [facility] and 1 most recently at [facility] in [place].  As a part of this work-up it appears that she had a CT to evaluate for superior vena cava obstruction at [facility] on January 17, 2022.  It appears from review of records that the patient also presented to [facility] with complaints of abdominal pain and bilateral flank pain as well and at that point had also reported throwing up blood stooling blood and vaginal bleeding.  [facility] documentation denotes that the patient has had 11 ED visits in September and 6 in October.  Patient had not mentioned these visits during my initial assessment and history taking.  When I asked her about being seen at [facility], and [facility] recently she does report that she had been seen at other hospitals but she is concerned that my knowing this has clouded my judgment and I thus won't give her pain medication.  I had a discussion with her and also had nurse present in the room, and I clearly discussed with her my recommendation which is that we would proceed with a CT scan to check for causation of her symptoms and also to evaluate and make certain she does not have recurrent SVC syndrome or other cause for her symptoms today.  However patient is very focused on the fact that she needs to receive a dose of Dilaudid. Also, discussed difficult IV access and that plan to have our IV team evaluate  to start an IV that we could use for CT scan. She reports that if this is going to cause delay that she would rather leave and it is getting late.     I do have some concerns reviewing her medical record that  it would be prudent to take her symptoms today in the context of her recent work-ups.  The patient however is very concerned that the fact that I have reviewed her records from the other ERs and somehow clouded my judgment, but my concern is that the patient has had medical work-ups for her complaints over the last several days at outside ERs and does not mention that her brought that to my attention.  I have offered her further work-up and treatment here, but the patient seems very focused on the fact that she needs to receive pain medication in particular before she would wish to proceed with any further work-up.  At this point, I again clearly offered further work-up including to get a CT scan to evaluate for any evidence of worsening or concern for her superior vena cava syndrome or other cause of her reported flank pain and symptoms.  However, the patient reports it is getting late.  At this point, received a phone call and notify the patient I was going to step out for 1 minute to take a phone call and then come back and discuss with her further.  By the time I had finished a short conversation with our internal medicine physician the patient had already got up, and had evidently decided she did not wish to stay for further care and treatment. She eloped.      -----------------------------------------  10:56 PM on 01/22/2022  -----------------------------------------  Patient eloped from the emergency department.  She ambulated out without difficulty and did not inform me of her plan to elope.    FINAL CLINICAL IMPRESSION(S) / ED DIAGNOSES     Final diagnoses:   Chest fullness   Bilateral flank pain     Rx / DC Orders     ED Discharge Orders       None         Note:  This document was prepared using Dragon voice recognition software and may include unintentional dictation errors.    Delman Kitten, MD  01/23/22 [phone]    Electronically signed by Delman Kitten, MD at 01/23/2022 12:39 AM EDT

## 2022-01-22 NOTE — ED Notes (Signed)
Formatting of this note might be different from the original.  RN attempted to place IV in L arm.  Blood returned but RN was unable to thread canula.  RN left room to obtain more IV supplies.      RN went into pt room after MD had to step out to take a phone call.  RN stated "I am going to hook you up to the monitor to get some vitals."  Pt states "you don't need to I am leaving."  Pt proceeded to gather her belongings.  RN asked pt what had transpired to cause her to not want any care.  Pt states "I am not going to sit here for hours and get all these tests before I get any pain medication."  RN explained that the MD had not finished talking with the patient and RN was not sure that the MD had made that decision yet. Pt then walked out of room and left the facility.  MD notified that pt left.  Electronically signed by Murrell Converse, RN at 01/22/2022 10:59 PM EDT

## 2022-01-22 NOTE — ED Notes (Signed)
Formatting of this note might be different from the original.  Lab draw attempted in triage, but unsuccessful. Pt would prefer to wait to get labs once pt is roomed. Per pt, she is a difficult stick and needs [place] for blood draw and IV insertion.   Electronically signed by Cephus Slater, RN at 01/22/2022  8:49 PM EDT

## 2022-01-22 NOTE — ED Provider Notes (Signed)
[facility] Provider Note    Event Date/Time   First MD Initiated Contact with Patient 01/22/22 2155     (approximate)   History   Flank Pain   HPI  Evelyn Bryant is a 36 y.o. female who on review of a nephrology consult from September of this year has a history of lupus, end-stage renal disease, hypertension diabetes, and supervior vena cava syndrome  Patient reports been having several days of flank pain and a feeling of fullness across her upper abdomen with nausea and vomiting.  She is also felt like she is having swelling in her lower neck and upper chest area.  She reports she had similar symptoms in the past when she has had superior vena cava syndrome and stenting performed at [facility].  She denies shortness of breath.  She is not having chest pain but rather reports a feeling of fullness . she had hemodialysis yesterday and her next scheduled appointment is Monday.  No fevers or chills.  Reports a feeling of fullness across the upper chest and also discomfort into her bilateral flanks.  Patient reports that she is allergic to morphine and fentanyl.  She reports that she can be treated with Dilaudid also needs medication for itching.     Physical Exam   Triage Vital Signs: ED Triage Vitals  Enc Vitals Group     BP 01/22/22 2046 (!) 146/99     Pulse Rate 01/22/22 2046 85     Resp 01/22/22 2046 18     Temp 01/22/22 2046 98.4 F (36.9 C)     Temp Source 01/22/22 2046 Oral     SpO2 01/22/22 2046 96 %     Weight 01/22/22 2038 170 lb 13.7 oz (77.5 kg)     Height --      Head Circumference --      Peak Flow --      Pain Score 01/22/22 2038 9     Pain Loc --      Pain Edu? --      Excl. in [place]? --     Most recent vital signs: Vitals:   01/22/22 2046  BP: (!) 146/99  Pulse: 85  Resp: 18  Temp: 98.4 F (36.9 C)  SpO2: 96%     General: Awake, no distress.  She does not appear to be in any acute extremis at this time.  She does  not appear to be in any obvious acute pain. Patient does seem to have some mild fullness and increased venous distention of the veins around her lower neck and anterior upper chest wall.  There is no erythema or tenderness CV:  Good peripheral perfusion.  Normal heart tones. Resp:  Normal effort.  Normal respiratory effort.  Lung sounds are clear with normal oxygen saturation Abd:  No distention.  Soft nontender Other:  Fistula in the right upper extremity   ED Results / Procedures / Treatments   Labs (all labs ordered are listed, but only abnormal results are displayed) Labs Reviewed  LIPASE, BLOOD  COMPREHENSIVE METABOLIC PANEL  CBC  URINALYSIS, ROUTINE W REFLEX MICROSCOPIC  POC URINE PREG, ED    PROCEDURES:  Critical Care performed: No  Procedures   MEDICATIONS ORDERED IN ED: Medications - No data to display   IMPRESSION / MDM / [place] / ED COURSE  I reviewed the triage vital signs and the nursing notes.  Differential diagnosis includes, but is not limited to, potential volume overload, recurrent SVC syndrome, pulmonary edema, ACS, coronary artery disease, etc.  Based on the patient's concern of a feeling of recurrent fullness in her neck and upper chest which she reports in the past has been a symptom of her previous superior vena cava syndrome I think it would be prudent to evaluate and work this up.   Patient's presentation is most consistent with acute complicated illness / injury requiring diagnostic workup.   I reviewed the patient's recent medical records, and of note find that the patient has had 3 recent ER visits 1 at [place], 1 at [facility] and 1 most recently at [facility] in [place].  As a part of this work-up it appears that she had a CT to evaluate for superior vena cava obstruction at [facility] on January 17, 2022.  It appears from review of records that the patient also presented to [facility] with complaints of  abdominal pain and bilateral flank pain as well and at that point had also reported throwing up blood stooling blood and vaginal bleeding.  [facility] documentation denotes that the patient has had 11 ED visits in September and 6 in October.  Patient had not mentioned these visits during my initial assessment and history taking.  When I asked her about being seen at [facility] [place], and [facility] recently she does report that she had been seen at other hospitals but she is concerned that my knowing this has clouded my judgment.  I had a discussion with her and also had nurse present in the room, and I clearly discussed with her my recommendation which is that we would proceed with a CT scan to check for causation of her symptoms and also to evaluate and make certain she does not have recurrent SVC syndrome or other cause for her symptoms today.  However patient is very focused on the fact that she needs to receive a dose of Dilaudid.  She reports that if this is going to cause delay that she would rather leave and is getting late.      ----------------------------------------- 10:56 PM on 01/22/2022 ----------------------------------------- Patient eloped from the emergency department.  She ambulated out without difficulty and did not inform me of her plan to elope.  FINAL CLINICAL IMPRESSION(S) / ED DIAGNOSES   Final diagnoses:  None     Rx / DC Orders   ED Discharge Orders     None        Note:  This document was prepared using Dragon voice recognition software and may include unintentional dictation errors.

## 2022-01-22 NOTE — ED Triage Notes (Addendum)
Pt arrives with c/o bilateral flank pain that started Wednesday. Pt endorses n/v and ABD pain as well. Pt also c/o neck swelling. Pt is HD pt and attends HD MWF. Per pt, she has not missed any treatments.

## 2022-01-22 NOTE — ED Notes (Addendum)
RN attempted to place IV in L arm.  Blood returned but RN was unable to thread canula.  RN left room to obtain more IV supplies.    RN went into pt room after MD had to step out to take a phone call.  RN stated "I am going to hook you up to the monitor to get some vitals."  Pt states "you don't need to I am leaving."  Pt proceeded to gather her belongings.  RN asked pt what had transpired to cause her to not want any care.  Pt states "I am not going to sit here for hours and get all these tests before I get any pain medication."  RN explained that the MD had not finished talking with the patient and RN was not sure that the MD had made that decision yet. Pt then walked out of room and left the facility.  MD notified that pt left.

## 2022-01-22 NOTE — ED Notes (Signed)
Lab draw attempted in triage, but unsuccessful. Pt would prefer to wait to get labs once pt is roomed. Per pt, she is a difficult stick and needs [place] for blood draw and IV insertion.

## 2022-02-28 NOTE — Unmapped (Signed)
Formatting of this note is different from the original.    Document Name:  Dialysis Enrollment    Patient Name:  Meredith Hawkins, Meredith Hawkins    Chart #:  [id number]    DOB:  [birth year]    Date:  Feb 28, 2022      Patient Type: ESRD  Modality: Hemodialysis  Nephrologist: Lacretia Leigh MD  Location: [facility]  Schedule: M-W-F 2nd [place], NP-C  [ Signed And locked electronically On 02/28/2022 at 08:52:41 AM ]    Transcribed: Fredric Dine, NP-C ( 02/28/2022 )    Electronically signed by Fredric Dine, NP-C at 02/28/2022  8:52 AM EST

## 2022-02-28 NOTE — Unmapped (Signed)
Formatting of this note is different from the original.    Document Name:  Missed Appointment    Patient Name:  Meredith Hawkins, Meredith Hawkins    Chart #:  [id number]    DOB:  10-25-1985    Date:  Feb 28, 2022                  Patient did not show for dialysis today.          Fredric Dine, NP-C  [ Signed And locked electronically On 02/28/2022 at 02:04:08 PM ]    Transcribed: Fredric Dine ( 02/28/2022 )    Electronically signed by Fredric Dine, NP-C at 02/28/2022  2:04 PM EST

## 2022-03-12 NOTE — ED Notes (Signed)
Formatting of this note might be different from the original.  Pt states that she is a hard stick and will not see anyone to draw her blood unless they have an ultrasound machine.   Electronically signed by Vilinda Flake, RN at 03/12/2022  7:33 PM EST

## 2022-03-12 NOTE — Unmapped (Signed)
Formatting of this note might be different from the original.  6:40 PM EST    Patient complains of vomiting with hematemesis and hematochezia. Patient is a dialysis patient and has fistula in right arm.  No acute distress, ambulatory, and A&OX4        I have seen the patient and initiated a medical screening exam.  Patient will require further evaluation to ensure that an emergency medical condition does not exist or that one has been stabilized.  Jodelle Gross, PA-C     Jodelle Gross, PA-C    Electronically signed by Jodelle Gross, PA-C at 03/12/2022  6:42 PM EST

## 2022-03-13 NOTE — Nursing Note (Signed)
Formatting of this note might be different from the original.  Patient arrived to the unit with transport,ambulated to the room without assistance and AIDET performed.Admission,skin assessment and CHG completed..Oriented to the room,belongings at bedside with patient informed.One episode of emesis ,prn pain medicine given times two rating pain  at 10/10. Has urinalysis pending, patient educated to call when sample is available.  Fluids and snacks given,call light within reach.  Electronically signed by Rolan Lipa, RN at 03/14/2022  4:47 AM EST

## 2022-03-13 NOTE — Unmapped (Signed)
Formatting of this note might be different from the original.    Problem: Pain/Comfort  Goal: Adequate pain control while hospitalized  Outcome: Continue with Current Goal    Problem: Fall Risk  Goal: Patient will remain as independent as possible  Outcome: Continue with Current Goal    Problem: Fall Risk  Goal: Patient will have lower fall risk. Lower injury risk.  Outcome: Continue with Current Goal    Problem: Fall Risk  Goal: Patient will remain safe from falls and injury  Outcome: Continue with Current Goal    Electronically signed by Rolan Lipa, RN at 03/13/2022 10:16 PM EST

## 2022-03-13 NOTE — Unmapped (Signed)
Formatting of this note might be different from the original.      The patient's case has been endorsed to me by Dr Jeannine Kitten, please see their initial patient care note.  Pt was discussed on rounds, plan of care agreed upon plan as follows:    CT AP and then Admit  Renal failure     Likely Disposition:Admit    SIGN OUT RE-EVALUATION:    Pt is to have a CT and then admit given they are in ESRD and no dialysis.     9:40 AM EST  FINDINGS/IMPRESSION:   *    Vicarious excretion of contrast within the gallbladder.  *    Numerous bilateral renal cystic lesions replacing most of the renal parenchyma. No hydroureteronephrosis. No obstructing renal calculi.  *    Left adnexal cyst measuring up to 3.0 cm.  *    Splenic cystic lesion. Mild gastric wall edema. Diffuse engorged subcutaneous vasculature.  *    No bowel obstruction or distention.      Preliminary findings were published to the electronic medical record by Tish Frederickson, MD.    9:54 AM EST  Pt still with nausea and generalized abd pain.  Noted to also have suprapubic tenderness and bilateral CVA tenderness.     No active vomiting.   Mild headache  Will add on a COVID/Flu panel.   IV morphine and antiemetics. Cosideration to treat UTI (although pt makes only a small amount of urine every day)  She has also missed her dialysi (since Wednesday) and she has M/W/F.   Admit for pain and   Diagnosis(es):  ESRD  Abdominal pain     Disposition:  Admit per sign out from Dr. Jeannine Kitten    Condition:   Stable    The patient verbalizes understanding of the diagnosis and disposition.    Pt will notify their PCP of the ED visit and schedule a follow up.     Pt should return for worsening symptoms.     If any controlled substances or new pain medications/ muscle relaxants are given, please read the insert from the pharmacy regarding activities while taking those medications.    Please do not drink or drive with these medications.   Follow up with your PCP regarding any new medications.      Pt appears to have medical capacity and all questions answered to their satisfaction    Note: translation line was used, if needed.     Alferd Patee, MD    Electronically signed by Alferd Patee, MD at 03/13/2022  9:57 AM EST

## 2022-03-13 NOTE — Consults (Signed)
Formatting of this note is different from the original.    [facility] Nephrology Consult Notes     Patient ID: Meredith Hawkins  MRN: [id number]  Admission Date: 03/12/2022  Attending [place], [place]: [facility] [place]  Name of Nephrologist: Dr. Lacretia Leigh  Access: RUE AVF ~7 years ago  How long has patient been on dialysis: 2014    Reason for consult: ESRD(MWF)    Subjective     CC: Abdominal/flank/chest pain    [id number] Meredith Hawkins is a 36 y.o. 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 Pt states these symptoms started 2 days ago, but denies any fevers, SOB. Endorse pleuritic chest pain. Patient was last dialyzed on Monday prior to arriving to [facility] for an emergency family case. She does not take any medications associated for her lupus since starting HD ~10 years ago. Pt also c/o of dysuria and increase in urinary frequency starting 2 days ago.    Upon admission to ED, patient lab significant for BUN 28, Cr 8.3, AG17, with a UA positive for >50 WBC and 3+LE. She received CTAP with IV contrast significant for bilateral renal cystic lesions, likely consistent with ESRD. Pt started on CTX 1g. [facility] was consulted for assistance in management of ESRD(MWF).     Past Medical History  Past Medical History:   Diagnosis Date    ESRD (end stage renal disease) on dialysis (CMS-HCC)      Past Surgical History  History reviewed. No pertinent surgical history.  Right UE AVF placed ~7 years ago    Home Medications  No current facility-administered medications on file prior to encounter.     Current Outpatient Medications on File Prior to Encounter   Medication Sig Dispense Refill    AmLODIPine (NORVASC) 10 mg tablet Take 1 tablet by mouth.      apixaban (ELIQUIS) 5 MG tablet Take 5 mg by mouth.       Allergies  Allergies   Allergen  Reactions    Ace Inhibitors     Acetaminophen     Codeine     Fentanyl     Hydrocodone     Oxycodone     Sulfa Antibiotics      Social History  Social History     Tobacco Use    Smoking status: Never    Smokeless tobacco: Never   Substance Use Topics    Alcohol use: Never    Drug use: Never     Family History  No family history on file.  None    Review of Systems  A 10 point ROS was completed and was negative except per HPI    Objective      Vital Signs  Vital Signs    Temp:  [35.7 C (96.3 F)-36.9 C (98.4 F)] 35.7 C (96.3 F) (12/24 1059)  Heart Rate:  [74-84] 75 (12/24 1059)  Respirations:  [16-18] 18 (12/24 1059)  Blood Pressure: (150-159)/(86-100) 150/97 (12/24 1059)     No intake or output data in the 24 hours ending 03/13/22 1244     No intake/output data recorded.  No intake/output data recorded.    Physical Exam  Constitutional: NAD, appropriately conversant   Eyes: Anicteric sclerae, EOMI bilaterally  HENMT: NC/AT, MMM  Cardiovascular: Harsh systolic murmur, normal S1 S2, no pericardial friction rub, no JVD  Respiratory: CTAB, no wheezing, rales, rhonchi  Gastrointestinal: mildly tender, NT, BS+  Genitourital: Suprapubic tenderness  Musculoskeletal: NROM, cyanosis, 1+ pitting edema, RUE AVF with +thrill/bruit, negative asterixis   Skin: Warm, dry  Neurologic: AOx4  Psychiatric: Appropriate mood and affect  Hem/Lymph/Imm: No bruising, bleeding or petechiae appreciable    Labs/Imaging     Lab Results   Component Value Date    WBC 2.2 (L) 03/13/2022    HGB 10.4 (L) 03/13/2022    HCT 32.4 (L) 03/13/2022    PLT 132 (L) 03/13/2022    NA 139 03/13/2022    K 4.2 03/13/2022    CL 97 (L) 03/13/2022    CO2 25 03/13/2022    BUN 28 (H) 03/13/2022    CREATININE 8.3 (H) 03/13/2022    GLU 82 03/13/2022    AST 24 03/13/2022    ALT 20 03/13/2022    BILITOT 0.3 03/13/2022    BILIDIR <0.1 03/13/2022    ALKPHOS 104 03/13/2022    PROT 7.0 03/13/2022    LABALB 4.3 03/13/2022     UA:  Lab Results   Component Value Date     COLORU Yellow 03/13/2022    CLARITYU Hazy 03/13/2022    SPECGRAV 1.009 03/13/2022    UAPH 8.0 03/13/2022    PROTEINU 2+=100 mg/dL (A) 03/13/2022    GLUCOSEU Negative < 50 mg/dL 03/13/2022    KETONESU Negative < 5 mg/dL 03/13/2022    BILIRUBINUR Negative < 2.0 mg/dL 03/13/2022    UROBILUA Negative <2.0 mg/dL 03/13/2022    HGBQUALU Negative <0.03 mg/dL 03/13/2022    NITRITE Negative 03/13/2022    LEUKESTRASEU 3+=500 WBC/uL (A) 03/13/2022    LEUKOCYTESUR > 50 / HPF (A) 03/13/2022    RBCU 3-10 / HPF (A) 03/13/2022    SQUAEPIUA 6 - 10 /HPF (A) 03/13/2022     Lab Results   Component Value Date    BACTERIAUA Present (A) 03/13/2022     No results found for: "RENALEPIUA", "HYALINECAST", "BROADCASTS", "GRANCASTUA", "CELLCASTUA", "WBCCASTSUR", "RBCCASTSUR", "EPICELLCAST", "WAXYCASTSUR", "FATTYCASTUA", "CASTSOTHERUA", "FINEGRANCA", "CRSEGRANCASU", "CACARBONCRY", "CALOXCRY"  No results found for: "PROTEIN24HR"    CTAP with IV contrast  IMPRESSION:   1.    No acute intra-abdominal or intrapelvic processes.  2.    Innumerable bilateral renal cystic lesions most of which are too small to characterize which could be secondary to polycystic renal disease. No hydronephrosis  3.    Diffuse engorged subcutaneous vessels involving the partially visualized thoracic subcutaneous soft tissues as well as the abdominal wall. This is of uncertain etiology.  4.    Additional chronic/incidental findings, as above.    Summary     Meredith Hawkins is a 36 y.o. 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 [facility] was consulted for assistance in management of ESRD(MWF) last dialyzed Monday.     Assessment and Recommendations     #ESRD(MWF)  #Anemia of Chronic Disease  #AGMA  - Pt last reported HD session on Monday. Lives in [place] and droves down to [place] for emergent family case.   - PE negative for pericardial friction rub, asterixis  - Clinically mildly volume overloaded  with 1+ PE  - Labs: BUN 28, Cr 8.3 with AG 17, Hgb 10.4  - Access RUE AVF  - Given patients current clinical picture, with normal electrolytes, and mildly elevated BUN and Cr. Will hold ooff on HD today.    #HTN  BP 150/90s  Home med: amlodipine 10 mg    #UTI  - Dysuria and increase in urinary frequency ~2 days   - +UA 3+ LE, and >50 WBC, started on CTX 1g    #SLE  - Dx over 10 years ago, without any medical management since starting HD ~7 years ago  - Denies any joint paint    Recommendations:  - Re-evaluate tomorrow for need of dialysis. Continue to monitor for now  - Restart home BP medications  - Collect Mg, PO4, PTH, Vit. D, Iron, TIBC, %Iron Sat, ferritin  - Collect HBV surface antigen  - Renally dose medications  - Avoid nephrotoxins   - Monitor UOP  - Monitor renal function     Thank you for having [place] participate in this patient's care! The assessment & recommendations reflect the input of my attending. Will continue to follow. Attending note to follow.      Haskel Khan, MD  PGY-1, Internal Medicine  [facility]  Nephrology Phone: [phone]      Electronically signed by Rosine Door, MD at 03/13/2022  7:27 PM EST    Associated attestation - Rosine Door, MD - 03/13/2022  7:27 PM EST  Formatting of this note might be different from the original.  Nephrology Attending:    Pt seen and examined. Reviewed with Dr. Erlinda Hong.  Agree with resident's note & findings. The assessment and plan of care reflects my direct input.  ESRD pt with SLE admitted for abdominal pain with N/V.  No s/s of uremia.  Not volume overloaded.  No HD today. Evaluate for HD in am.    Rosine Door, MD  [facility]  [facility] # [phone]  Pager# [phone]

## 2022-03-13 NOTE — ED Notes (Signed)
Formatting of this note is different from the original.  First contact with pt.  Chief Complaint   Patient presents with    Abdominal Pain     Pt endorses chest pain/flank pain/abd pain 9/10 for the past 2 days. Pt also endorses blood in vomit and bowel movements.       PMH ESRD w/HD M/W/F, lupus ovarian cysts. States she missed her dialysis on Wednesday and Friday. Denies HA, urinary symptoms. Resp even and unlabored on RA. Care ongoing.   Electronically signed by Pricilla Larsson, RN at 03/13/2022 12:46 AM EST

## 2022-03-13 NOTE — ED Provider Notes (Signed)
Formatting of this note is different from the original.     EMERGENCY MEDICINE  RAPID MEDICAL EXAMINATION NOTE       03/12/2022  2:39 AM EST    Subjective:   Chief Complaint:   Chief Complaint   Patient presents with    Abdominal Pain     Pt endorses chest pain/flank pain/abd pain 9/10 for the past 2 days. Pt also endorses blood in vomit and bowel movements.      HPI:(at least one Chief Complaint detail)  Narrative: Meredith Hawkins is a 36 yo female with ho lupus who presents with co cp and abdominal pain after missed dialysis. Pt reports she is typically dialyzed MWF, however she traveled to [place] to see about her family member with last dialysis session on Monday. PT reports over the past 2 days she has had achy, diffuse pain to her abdomen. She reports multiple episodes of nausea/vomiting and diarrhea and states she saw streaking of blood in her vomit and diarrhea. She denies melena.    Past Medical History: The information below is populated from the nursing note but has been reviewed by the provider.  Past Medical History:   Diagnosis Date    ESRD (end stage renal disease) on dialysis (CMS-HCC)      History reviewed. No pertinent surgical history.   No family history on file.  Social History     Socioeconomic History    Marital status: Single     Spouse name: None    Number of children: None    Years of education: None    Highest education level: None   Occupational History    None   Tobacco Use    Smoking status: Never    Smokeless tobacco: Never   Substance and Sexual Activity    Alcohol use: Never    Drug use: Never    Sexual activity: Not Currently   Other Topics Concern    None   Social History Narrative    None     Social Determinants of Health     Financial Resource Strain: Not on file   Food Insecurity: Not on file   Transportation Needs: Not on file   Housing Stability: Not on file     Objective: (at least two organ systems)  VS: Temp: 36.9 C (98.4 F)  Heart Rate: 78  Respirations: 16  Blood Pressure:  (!) 159/100  SpO2: 100 %  Pt alert  RRR  Lungs clear  Abdomen soft, nt, nd  Fistula noted to RUE.   No significant leg swelling noted.      Assessment/Diagnosis(es)   Plan/Recommendation    1. Pt presents with diffuse abdominal pain in setting of missed dialysis. Pt also reports some streaking of blood in her vomit and stool.  VS notable only for mild elevation in bp. Plan for basic labs and reassessment.     1. Pending Ct Abdominal/Pelvis      Disposition: PENDING CT    Condition: stable    Documentation of Risk  In support of CPT [phone], the chart reflects that the patient was seen for a new problem, has at least one system reviewed, has at least two organ systems examined, and had two or more data points ordered/reviewed.     Zenaida Deed, MD     Emergency Department       Zenaida Deed, MD  03/23/22 1239    Electronically signed by Zenaida Deed, MD at 03/23/2022 12:39  PM EST

## 2022-03-13 NOTE — ED Notes (Signed)
Formatting of this note might be different from the original.  Vasc team at bedside for midline placement.  Electronically signed by Pricilla Larsson, RN at 03/13/2022  3:14 AM EST

## 2022-03-13 NOTE — ED Notes (Signed)
Formatting of this note might be different from the original.  Pt given urine specimen cup and wet prep for self swab. Educated on indication and how to collect specimens, states she would like to wait until she has to urinate to collect.   Electronically signed by Pricilla Larsson, RN at 03/13/2022  1:38 AM EST

## 2022-03-13 NOTE — ED Notes (Signed)
Formatting of this note might be different from the original.  Patient is resting comfortably. No distress.  Electronically signed by Kandice Moos, RN at 03/13/2022  2:16 PM EST

## 2022-03-13 NOTE — H&P (Signed)
Formatting of this note is different from the original.  [facility] Medicine History & Physical Note   Patient ID: [facility] day:  0  Admit Date: 03/12/2022 11:27 PM  Today's date: 03/13/2022    Chief Complaint: Abdominal Pain     HPI:   Meredith Hawkins is a 36 y.o. female with a PMHx of  Lupus, ESRD, Superior Vena Cava Syndrome, and HTN  admitted for abdominal pain. Patient states she has been having nausea for the last couple of days. States that she is not experiencing vomiting or diarrhea. Nausea is made worse by eating, was able to tolerate juice at bedside. Complains of supra pubic pain that wraps around to the back. Endorses some increased urinary frequency and occasional dysuria. States that she has multiple ED visits due to her chronic conditions. Last dialysis she states was "Monday". States she is visiting a sick cousin for the holidays but primary residence is in [place]. Denies chest pain, SOB, active bleeding.     PMHx:   Past Medical History:   Diagnosis Date    ESRD (end stage renal disease) on dialysis (CMS-HCC)      Home medications:   No current facility-administered medications on file prior to encounter.     Current Outpatient Medications on File Prior to Encounter   Medication Sig Dispense Refill    AmLODIPine (NORVASC) 10 mg tablet Take 1 tablet by mouth.      apixaban (ELIQUIS) 5 MG tablet Take 5 mg by mouth.         FMHx:   family history is not on file.    SurgHx:  History reviewed. No pertinent surgical history.    SoHx:   Tobacco:  reports that she has never smoked. She has never used smokeless tobacco.   Alcohol:  reports no history of alcohol use.  Illicit Drugs:  reports no history of drug use.    Allergies:   Allergies   Allergen Reactions    Ace Inhibitors     Acetaminophen     Codeine     Fentanyl     Hydrocodone     Oxycodone     Sulfa Antibiotics      Review of Systems:  Review of Systems  GEN:   Denies fevers,   HEENT:  Denies changes in vision   CV:    Denies chest pain, orthopnea, and palpitations  PULM:  Denies cough and shortness of breath  GI:   Endorses nausea denies vomiting  GU:   Endorses dysuria and frequency  MSK:   Denies swelling and weakness  NEURO:  Denies numbness, tingling and focal weakness  SKIN:    Denies rash and skin breakdown  PSYCH:  Denies any depressive or manic symptoms    Objective     Physical Exam:   Weight: 77.5 kg (170 lb 13.7 oz)    Temp:  [35.7 C (96.3 F)-36.9 C (98.4 F)] 35.7 C (96.3 F)  Heart Rate:  [74-84] 75  Respirations:  [16-18] 18  Blood Pressure: (150-159)/(86-100) 150/97    Vitals:    03/12/22 1928   Weight: 77.5 kg (170 lb 13.7 oz)     No intake or output data in the 24 hours ending 03/13/22 1149    General appearance: Pt is not  in NAD. The patient is conversant.   Eyes: Anicteric sclerae, moist conjunctivae; no lid-lag  HENT: Atraumatic  Neck: Trachea midline. FROM, supple, tortuous veins noted  Lungs: Trachea in midline.  Equal chest expansions bilaterally. Clear breath sounds heard bilaterally.  No wheezes or rales appreciated. No peripheral cyanosis or clubbing.   CV: Regular rate and rhythm. Normal S1/S2. No S3/S4. No murmurs, rubs, or gallops.   Abdomen: Non-distended. No visible pulsations. Normoactive bowel sounds. No voluntary or involuntary guarding; or rebound tenderness; no obvious organomegaly. Mild tenderness noted   Musculoskeletal: Normal bulk and tone, left UE fistula noted, RUE healed fistula noted   Skin: Normal temperature, turgor and texture  Neurological:  CN 2-12 are grossly intact without any deficits.   Psych: Appropriate affect, alert and oriented to person, place and time      Labs/Studies:   Recent Labs   Lab 03/13/22  0126   WBC 2.2*   HGB 10.4*   HCT 32.4*   PLT 132*   MCH 30.9   MCHC 32.1   MCV 96.0     Recent Labs   Lab 03/13/22  0126   NA 139   K 4.2   CL 97*   CO2 25   BUN 28*   CREATININE 8.3*   GLU 82   CALCIUM 8.7*   CALIUMALBADJ 8.7*   LABALB 4.3   PROT 7.0   BILITOT 0.3    BILIDIR <0.1   ALKPHOS 104   ALT 20   AST 24     No results for input(s): "PT", "APTT", "INR" in the last 168 hours.  No results for input(s): "CKMB", "TROPONINI" in the last 72 hours.    Invalid input(s): "CPKTOTAL"  No results for input(s): "PHART", "PCO2ART", "PO2ART" in the last 72 hours.    Imaging:   Radiology Results (last 1 day)       Procedure Component Value Units Date/Time    CT Abdomen and Pelvis With IV Contrast Only [id number] Collected: 03/13/22 0912    Order Status: Completed Updated: 03/13/22 0918    Narrative:      PRELIMINARY FINDINGS AND COMMUNICATION:    EXAM:  CT ABDOMEN AND PELVIS WITH IV CONTRAST ONLY  MRN: [id number]  INDICATION:  Abdominal pain, acute, nonlocalized  ADDITIONAL HISTORY: Chart review reveals history of lupus, on dialysis. Presents status post abdominal pain after missed dialysis.  COMPARISON: None.    FINDINGS/IMPRESSION:   *    Vicarious excretion of contrast within the gallbladder.  *    Numerous bilateral renal cystic lesions replacing most of the renal parenchyma. No hydroureteronephrosis. No obstructing renal calculi.  *    Left adnexal cyst measuring up to 3.0 cm.  *    Splenic cystic lesion. Mild gastric wall edema. Diffuse engorged subcutaneous vasculature.  *    No bowel obstruction or distention.    Preliminary findings were published to the electronic medical record by Tish Frederickson, MD.    COMMUNICATION:  None.    Prelim reconciliation: N/A         Assessment and Plan     Meredith Hawkins is a 36 y.o. female with a PMHx of Lupus, ESRD, Superior Vena Cava Syndrome, and HTN  admitted for abdominal pain.     #Abdominal Pain   Assessment:  - Lipase: 30, Liver Enzymes unremarkable   - Complaints of persistent nausea and abdominal pain   - CT AB Prelim Reading:    *    Vicarious excretion of contrast within the gallbladder.  *    Numerous bilateral renal cystic lesions replacing most of the renal parenchyma. No hydroureteronephrosis. No obstructing renal calculi.  *     Left  adnexal cyst measuring up to 3.0 cm.  *    Splenic cystic lesion. Mild gastric wall edema. Diffuse engorged subcutaneous vasculature.  *    No bowel obstruction or distention.  - Patient has had several ED visits and admission   Plan:  - Pain management 65m morphine Q4 prn   - Zofran 428mIV Q8prn fo nausea   - Advance diet as tolerated   - Follow up final read CTAB   - Continue to monitor     #ESRD   Assessment:  - On HD MWF endorses missing some sessions but [place]  Plan:  - Consult to Nephrology for HD scheduling     #SLE   Assessment:  - Does not appear to be on any specific management  Plan:  - Can consider Rheum consult if patient fails to improve     #Hypertension   Assessment:  - Per patient is only taking Amlodipine 1051m - BP 150s/90s may be 2/2 to c/o of pain and discomfort  Plan:  - Continue home med  - Continue to monitor     #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   Assessment:  - Home Med: Apixiban 5mg34mD   Plan:  - Continue home medication     #Concern for UTI   Assessment:  - States that she has had increased frequency with occasional dysuria   - UA is LE positive   Plan:  - Start Ceftriaxone     -----------------------------------------------------------------------------------------------  Activity   :  As tolerated   Diet   :  Regular   Prophylaxis  : On Home Anticoagulation   Consults  : Nephrology  Code status  : Full   Disposition  : Pending clinical advancement   Discharge planning : Pending    CarmAmparo Bristol  Family Medicine, PGY-3  [facility]  Phone: [phone] call Cross Cover Mon-Fri after 3pm, and Sat-Sun after 12pm  [place] Team A, CDeliah Goodym CrosPort Townsender: [phone]m Cross cover: [phone] Nighttime Cross cover: [phone]-[phone])         Electronically signed by KhanHildred Laser at 03/16/2022 10:32 AM EST    Associated attestation - KhanHildred Laser - 03/16/2022 10:32 AM EST  Formatting of this note might be different from the original.  Patient seen and examined.  The resident team and I discussed the patient and agree with assessment plan.

## 2022-03-14 NOTE — Unmapped (Signed)
Formatting of this note might be different from the original.    Problem: Knowledge Deficit  Goal: Patient will Participate in learning opportunities offered by staff  Outcome: Continue with Current Goal    Problem: Infection/Isolation (Risk/Actual)  Goal: Patient will be free of hospital acquired infection throughout hospitalization  Outcome: Continue with Current Goal    Problem: Pain/Comfort  Goal: Adequate pain control while hospitalized  Outcome: Continue with Current Goal    Problem: Fall Risk  Goal: Patient will remain as independent as possible  Outcome: Continue with Current Goal    Electronically signed by Benito Mccreedy, RN at 03/14/2022  7:49 AM EST

## 2022-03-14 NOTE — Progress Notes (Signed)
Formatting of this note is different from the original.    [place] Team E Progress Notes      Patient ID: [facility] day:  1  Admit Date: 03/12/2022 11:27 PM  Today's date: 03/14/2022    Summary Statement     Meredith Hawkins is a 36 y.o. female with a PMHx of  Lupus, ESRD, Superior Vena Cava Syndrome, and HTN  admitted for abdominal pain. Patient states she has been having nausea for the last couple of days. Complains of supra pubic pain that wraps around to the back. Endorses some increased urinary frequency and occasional dysuria.     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 The patient is conversant.   Eyes: Anicteric sclerae, moist conjunctivae; no lid-lag  HENT: Atraumatic  Neck: Trachea midline. FROM, supple, tortuous veins noted  Lungs: Trachea in midline. Equal chest expansions bilaterally. Clear breath sounds heard bilaterally.  No wheezes or rales appreciated. No peripheral cyanosis or clubbing.   CV: Regular rate and rhythm. Normal S1/S2. No S3/S4. No murmurs, rubs, or gallops.   Abdomen: Non-distended. No visible pulsations. Normoactive bowel sounds. No voluntary or involuntary guarding; or rebound tenderness; no obvious organomegaly. Mild  tenderness noted   Musculoskeletal: Normal bulk and tone, left UE fistula noted, RUE healed fistula noted   Skin: Normal temperature, turgor and texture  Neurological:  CN 2-12 are grossly intact without any deficits.   Psych: Appropriate affect, alert and oriented to person, place and time     Medications    Scheduled Medications   normal saline  10 mL Intravenous EVERY 8 HOURS    AmLODIPine  10 mg Oral Daily    apixaban  5 mg Oral BID    cefTRIAXone  1 g IV PUSH Q24H    sevelamer carbonate  1,600 mg Oral TID w/meals     Continuous Infusions     PRN Medications  [COMPLETED] Insert Midline Catheter **AND** Maintain Midline Catheter **AND** normal saline **AND** sodium chloride, [COMPLETED] Insert Peripheral IV **AND** Maintain Peripheral IV **AND** normal saline **AND** sodium chloride, sennosides, polyethylene glycol, morphine, ondansetron    Labs    Recent Labs     03/13/22  0126   NA 139   K 4.2   CL 97*   CO2 25   BUN 28*   CREATININE 8.3*   GLU 82   PROT 7.0     Recent Labs     03/13/22  0126   WBC 2.2*   HGB 10.4*   HCT 32.4*   PLT 132*   MCH 30.9   MCHC 32.1   MCV 96.0     No results for input(s): "PT", "APTT", "INR" in the last 72 hours.    No results for input(s): "PHART", "PCO2ART", "PO2ART" in the last 72 hours.    No results for input(s): "CKMB", "TROPONINI", "BNP" in the last 72 hours.    Invalid input(s): "CPKTOTAL"    Urine Analysis      Component Ref Range & Units 03/13/22 0426   COLOR-URINE Yellow, Straw, Colorless Yellow   CLARITY-URINE Clear, Hazy Hazy   SPECIFIC GRAVITY-URINE 1.001 - 1.035 1.009   PH, QUANT-URINE 5.0 - 8.0 8.0   PROTEIN,QUALITATIVE-URINE Negative <30 mg/dL 2+=100 mg/dL Abnormal    GLUCOSE,QUALITATIVE-URINE Negative < 50 mg/dL Negative < 50 mg/dL   KETONES,QUALITATIVE-URINE Negative < 5 mg/dL Negative < 5 mg/dL   BILIRUBIN,QUALITATIVE-URINE Negative < 2.0 mg/dL Negative < 2.0 mg/dL   UROBILINOGEN,QUALITATIVE-URINE Negative <2.0 mg/dL Negative <2.0 mg/dL    HEMOGLOBIN,QUALITATIVE-URINE Negative <0.03 mg/dL Negative <0.03 mg/dL   NITRITE,BACTERIAL-URINE Negative Negative   LEUKOCYTE ESTERASE-URINE Negative <25 WBC/uL 3+=500 WBC/uL Abnormal    WBC-URINE None / HPF, <2 / HPF > 50 / HPF Abnormal    RBC-URINE < 3 / HPF 3-10 / HPF Abnormal    SQUAMOUS EPITHELIAL CELL-URINE None / HPF, <2 / HPF 6 - 10 /HPF Abnormal    BACTERIA-URINE (none) Present Abnormal        Imaging:    03/13/22  Narrative & Impression   EXAM: CT ABDOMEN AND PELVIS WITH IV CONTRAST ONLY    CLINICAL INDICATION:  Abdominal pain, acute, nonlocalized     TECHNIQUE: Following administration of non-ionic IV contrast, postcontrast images through the abdomen and pelvis were obtained.    COMPARISON: None    FINDINGS:   Lower Thorax: Normal.    Liver: No suspicious hepatic lesions. Mild periportal edema.    Gallbladder/Biliary Tree: Excreted contrast within the gallbladder lumen    Spleen: Indeterminate hypoattenuating lesion within the spleen measuring up to 1.6 cm    Pancreas: No pancreatic ductal dilation.    Adrenal Glands: Normal.    Kidneys/Ureters: Innumerable cystic lesions within the bilateral kidneys most of which are too small to characterize. No hydronephrosis.    Gastrointestinal: No bowel obstruction. Colonic diverticulosis without inflammation.     Bladder: Decompressed.    Uterus/Adnexa: 3.0 x 1.9 cm cystic lesion of the left adnexa likely represents a benign cyst.    Lymph Nodes: No pathologically enlarged lymph nodes    Vessels: Aorta is normal in caliber. Proximal most celiac trunk and proximal most SMA are patent. Main portal vein is patent.    Peritoneum/Retroperitoneum: No organized fluid collections.    Bones/Soft Tissues: Diffuse abdominal wall collaterals as well as partially visualized chest wall collaterals.    *  IMPRESSION:   1.    No acute intra-abdominal or intrapelvic processes.  2.    Innumerable bilateral renal cystic lesions most of which are too small to characterize which  could be secondary to polycystic renal disease. No hydronephrosis  3.    Diffuse engorged subcutaneous vessels involving the partially visualized thoracic subcutaneous soft tissues as well as the abdominal wall. This is of uncertain etiology.  4.    Additional chronic/incidental findings, as above.      Assessment and Plan   Meredith Hawkins is a 36 y.o. female with a PMHx of Lupus,  ESRD, Superior Vena Cava Syndrome, and HTN  admitted for abdominal pain.       #Abdominal Pain   Assessment:  - Lipase: 30, Liver Enzymes unremarkable   - Complaints of persistent nausea and abdominal pain   - CT AB Reading:   1.    No acute intra-abdominal or intrapelvic processes.  2.    Innumerable bilateral renal cystic lesions most of which are too small to characterize which could be secondary to polycystic renal disease. No hydronephrosis  3.    Diffuse engorged subcutaneous vessels involving the partially visualized thoracic subcutaneous soft tissues as well as the abdominal wall. This is of uncertain etiology.  4.    Additional chronic/incidental findings, as above.  - Patient has had several ED visits and admission   Plan:  - Pain management 66m morphine Q4 prn   - Zofran 475mIV Q8prn fo nausea   - Will add Promethazine   - Advance diet as tolerated   - Follow up final read CTAB   - Continue to monitor     #ESRD   Assessment:  - On HD MWF endorses missing some sessions but [place]  Plan:  - Appreciate recommendations per cardiology     #SLE   Assessment:  - Does not appear to be on any specific management  Plan:  - Can consider Rheum consult if patient fails to improve     #Hypertension   Assessment:  - Per patient is only taking Amlodipine 1057m - BP 150s/90s may be 2/2 to c/o of pain and discomfort  Plan:  - Continue home med  - Continue to monitor       #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   Assessment:  - Home Med: Apixiban 5mg88mD   Plan:  - Continue home medication     #Concern for UTI   Assessment:  - States that she has had increased frequency with occasional dysuria   - UA is LE positive   Plan:  - Continue Ceftriaxone     -----------------------------------------------------------------------------------------------  Activity                         :  As tolerated   Diet                              :  Regular   Prophylaxis                 :  On Home Anticoagulation   Consults                      :  Nephrology  Code status                 :  Full   Disposition                  :   Pending clinical advancement   Discharge planning     :   Pending    CarmAmparo Bristol  Family Medicine, PGY-3  [facility]  Phone: [phone] call Cross Cover Mon-Fri after 3pm, and Sat-Sun after 12pm  [facility]m Cross cover: [phone] Surgery Hospitalteam Cross  cover: [phone]   [place] Nighttime Cross cover: [phone] (7pm-7am)     Electronically signed by Hildred Laser, MD at 03/16/2022 10:32 AM EST    Associated attestation - Hildred Laser, MD - 03/16/2022 10:32 AM EST  Formatting of this note might be different from the original.  Patient seen and examined.  The resident team and I discussed the patient and agree with assessment plan.

## 2022-03-14 NOTE — Unmapped (Signed)
Formatting of this note might be different from the original.    Problem: Knowledge Deficit  Goal: Patient will Participate in learning opportunities offered by staff  Outcome: Continue with Current Goal    Problem: Infection/Isolation (Risk/Actual)  Goal: Patient will be free of hospital acquired infection throughout hospitalization  Outcome: Continue with Current Goal    Problem: Pain/Comfort  Goal: Adequate pain control while hospitalized  Outcome: Continue with Current Goal    Problem: Fall Risk  Goal: Patient will remain as independent as possible  Outcome: Continue with Current Goal  Goal: Patient will have lower fall risk. Lower injury risk.  Outcome: Continue with Current Goal  Goal: Patient will remain safe from falls and injury  Outcome: Continue with Current Goal    Electronically signed by Duayne Cal, RN at 03/14/2022  8:25 PM EST

## 2022-03-14 NOTE — Consults (Signed)
Formatting of this note is different from the original.    [facility] Nephrology Progress Note     Patient ID: [facility] day: 1  Reason for consult: ESRD needing HD     Medications  Scheduled meds:    normal saline  10 mL Intravenous EVERY 8 HOURS    AmLODIPine  10 mg Oral Daily    apixaban  5 mg Oral BID    cefTRIAXone  1 g IV PUSH Q24H    sevelamer carbonate  1,600 mg Oral TID w/meals     Infusion meds:   PRN meds: promethazine, morphine, [COMPLETED] Insert Midline Catheter **AND** Maintain Midline Catheter **AND** normal saline **AND** sodium chloride, [COMPLETED] Insert Peripheral IV **AND** Maintain Peripheral IV **AND** normal saline **AND** sodium chloride, sennosides, polyethylene glycol, ondansetron    Summary Statement   Meredith Hawkins is a 36 y.o. female with a PMHx of  Lupus, ESRD, Superior Vena Cava Syndrome, and HTN  admitted for abdominal pain and missed HD session with her last session being 03/07/22 due to visiting [place] from [place] for family related issues.      Subjective     NAEO. Pt denies any CP and reports improvement of her abdominal pain.    Objective #1 24 hours     No UOP overnight     Objective #2   Vital Signs  Range: Temp:  [36.1 C (97 F)-36.3 C (97.3 F)] 36.3 C (97.3 F)  Heart Rate:  [65-78] 65  Respirations:  [18] 18  Blood Pressure: (143-169)/(96-102) 143/97  Current: BP (!) 143/97   Pulse 65   Temp 36.3 C (97.3 F) (Oral)   Resp 18   Ht 1.676 m (5' 6"$ )   Wt 85.1 kg (187 lb 9.6 oz)   SpO2 100%   BMI 30.28 kg/m     I/O:  No intake or output data in the 24 hours ending 03/14/22 1515  No intake/output data recorded.  No intake/output data recorded.    Physical Exam  Constitutional: NAD, pleasant  Eyes: Anicteric sclerae, EOMI bilaterally  HENMT: NC/AT, mucous membranes moist, supple neck  Cardiovascular: RRR, normal s1s2, no m/r/g, 2+ pulses radial/DP bilaterally, no JVD  Respiratory: CTAB, no wheezes, rales, rhonchi  Gastrointestinal: Soft,  NT, ND, normoactive +BS  Genitourinary: Deferred  Musculoskeletal: NROM, no edema, cyanosis  Skin: Warm, dry  Neurologic: AOx4, CN II-XII grossly intact  Psychiatric: Appropriate mood and affect  Hem/Lymph/Imm: No bruising, bleeding or petechiae appreciable    Labs/Imaging  Recent Labs   Lab 03/13/22  0126   WBC 2.2*   HGB 10.4*   HCT 32.4*   PLT 132*   MCH 30.9   MCHC 32.1   MCV 96.0     Recent Labs   Lab 03/13/22  0126   NA 139   K 4.2   CL 97*   CO2 25   BUN 28*   CREATININE 8.3*   GFR 6*   GLU 82   CALCIUM 8.7*   CALIUMALBADJ 8.7*   LABALB 4.3   PROT 7.0   BILITOT 0.3   BILIDIR <0.1   AST 24   ALT 20   ALKPHOS 104     No results for input(s): "PT", "APTT", "INR" in the last 168 hours.  No results for input(s): "PHART", "PCO2ART", "PO2ART" in the last 72 hours.  No results for input(s): "CKMB", "TROPONINI", "BNP" in the last 72 hours.    Invalid input(s): "CPKTOTAL"    Assessment  and Plan     #ESRD(MWF)  #Anemia of Chronic Disease  #AGMA  - Pt last reported HD session on Monday. Lives in [place] and droves down to [place] for emergent family case.   - PE negative for pericardial friction rub, asterixis  - Clinically mildly volume overloaded with 1+ PE  - Labs: BUN 28, Cr 8.3 with AG 17, Hgb 10.4  - Access RUE AVF  - Given patients current clinical picture, with normal electrolytes, and mildly elevated BUN and Cr. Will hold ooff on HD today.    #HTN  BP 150/90s  Home med: amlodipine 10 mg    #UTI  - Dysuria and increase in urinary frequency ~2 days   - +UA 3+ LE, and >50 WBC, started on CTX 1g    #SLE  - Dx over 10 years ago, without any medical management since starting HD ~7 years ago  - Denies any joint paint    Recommendations:  - NO HD today. Due to pt being stable.   - will schedule her for HD tomorrow but continue to monitor for now   - Collect Mg, PO4, PTH, Vit. D, Iron, TIBC, %Iron Sat, ferritin  - Collect HBV surface antigen  - Restart home BP medications  - Renally dose medications  - Avoid nephrotoxins   -  Monitor UOP  - Monitor renal function       Thank you for allowing [place] to participate in the care of this patient. Attending note to follow.    Freda Jackson, MD  PGY-3  [facility] Nephrology  03/14/2022  3:15 PM EST    Electronically signed by Rosine Door, MD at 03/14/2022  4:38 PM EST    Associated attestation - Rosine Door, MD - 03/14/2022  4:38 PM EST  Formatting of this note might be different from the original.  Nephrology Attending:    Pt seen and examined. Reviewed with Dr. Michela Pitcher.  Agree with resident's note & findings. The assessment and plan of care reflects my direct input.  ESRD pt with SLE admitted for abdominal pain with N/V. Symptoms persist.  No s/s of uremia.  Not volume overloaded.  No HD today. For HD in am. Orders placed.    Rosine Door, [place] [facility]  [facility] # [phone]  Pager# [phone]

## 2022-03-15 NOTE — Progress Notes (Signed)
Formatting of this note is different from the original.  Hemodialysis Treatment Summary Note    Name: Meredith Hawkins Admission Date: 03/12/2022 11:27 PM   Sex: female Attending Provider: Hildred Laser, MD   MRN: [id number] DOB: 1985-09-01   Age: 36 y.o.      Treatment Start Date: 03/15/22  Treatment Start Time: 0730  Treatment End Date: (P) 03/15/22  Treatment End Time: (P) 1030    Treatment Status: (P) Completed  Reason Treatment Not Completed: --    Albumin (mL): (P) 0  Normal Saline (mL): (P) 0  Blood Transfusion (mL): (P) 0  Net Fluid Bal: (P) -2000 mL    Patient remained stable during treatment, tolerated well. SBAR given to Hinda Lenis, RN    Signed: Tyna Jaksch, RN  Electronically signed by Tyna Jaksch, RN at 03/15/2022 10:48 AM EST

## 2022-03-15 NOTE — Progress Notes (Signed)
Formatting of this note is different from the original.    [place] Team E Progress Notes      Patient ID: [facility] day:  2  Admit Date: 03/12/2022 11:27 PM  Today's date: 03/15/2022    Summary Statement     Meredith Hawkins is a 36 y.o. female with a PMHx of  Lupus, ESRD, Superior Vena Cava Syndrome, and HTN  admitted for abdominal pain. Patient states she has been having nausea for the last couple of days. Complains of supra pubic pain that wraps around to the back. Endorses some increased urinary frequency and occasional dysuria. Patient tolerated lunch at bedside. S/p HD today.     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)    Secondary hyperparathyroidism of renal origin (CMS-HCC)     Subjective     HD went well today, Completed full lunch at bedside today     Objective    Vital Signs   Temp:  [36.5 C (97.7 F)-37.2 C (99 F)] 36.5 C (97.7 F)  Heart Rate:  [55-78] 78  Respirations:  [18] 18  Blood Pressure: (126-155)/(77-103) 152/88  BP (!) 152/88   Pulse 78   Temp 36.5 C (97.7 F) (Tympanic)   Resp 18   Ht 1.676 m (5' 6"$ )   Wt 95.5 kg (210 lb 9.6 oz)   SpO2 100%   BMI 33.99 kg/m      Intake/Output Summary (Last 24 hours) at 03/15/2022 1414  Last data filed at 03/15/2022 1030  Gross per 24 hour   Intake 500 ml   Output 2500 ml   Net -2000 ml       Physical Exam  General appearance: Pt is not  in NAD. The patient is conversant.   Eyes: Anicteric sclerae, moist conjunctivae; no lid-lag  HENT: Atraumatic  Neck: Trachea midline. FROM, supple, tortuous veins noted  Lungs: Trachea in midline. Equal chest expansions bilaterally. Clear breath sounds heard bilaterally.  No wheezes or rales appreciated. No peripheral cyanosis or clubbing.   CV: Regular rate and rhythm. Normal S1/S2. No S3/S4. Systolic murmur noted   Abdomen:  Non-distended. No visible pulsations. Normoactive bowel sounds. No voluntary or involuntary guarding; or rebound tenderness; no obvious organomegaly. Mild tenderness noted   Musculoskeletal: Normal bulk and tone, left UE fistula noted, RUE healed fistula noted   Skin: Normal temperature, turgor and texture  Neurological:  CN 2-12 are grossly intact without any deficits.   Psych: Appropriate affect, alert and oriented to person, place and time     Medications    Scheduled Medications   normal saline  10 mL Intravenous EVERY 8 HOURS    AmLODIPine  10 mg Oral Daily    apixaban  5 mg Oral BID    cefTRIAXone  1 g IV PUSH Q24H    sevelamer carbonate  1,600 mg Oral TID w/meals     Continuous Infusions     PRN Medications  promethazine, morphine, albumin human, [COMPLETED] Insert Midline Catheter **AND** Maintain Midline Catheter **AND** normal saline **AND** sodium chloride, [COMPLETED] Insert Peripheral IV **AND** Maintain Peripheral IV **AND** normal saline **AND** sodium chloride, sennosides, polyethylene glycol, ondansetron    Labs    Recent Labs     03/13/22  0126 03/15/22  1003  NA 139 135   K 4.2 3.5   CL 97* 93*   CO2 25 29   BUN 28* 23*   CREATININE 8.3* 6.8*   GLU 82 76   MG  --  1.9   PHOS  --  2.9   PROT 7.0 6.8     Recent Labs     03/13/22  0126 03/15/22  0924   WBC 2.2* 2.2*   HGB 10.4* 9.5*   HCT 32.4* 28.2*   PLT 132* 123*   MCH 30.9 31.4   MCHC 32.1 33.6   MCV 96.0 94.0     No results for input(s): "PT", "APTT", "INR" in the last 72 hours.    No results for input(s): "PHART", "PCO2ART", "PO2ART" in the last 72 hours.    No results for input(s): "CKMB", "TROPONINI", "BNP" in the last 72 hours.    Invalid input(s): "CPKTOTAL"    Urine Analysis      Component Ref Range & Units 03/13/22 0426   COLOR-URINE Yellow, Straw, Colorless Yellow   CLARITY-URINE Clear, Hazy Hazy   SPECIFIC GRAVITY-URINE 1.001 - 1.035 1.009   PH, QUANT-URINE 5.0 - 8.0 8.0   PROTEIN,QUALITATIVE-URINE Negative <30 mg/dL 2+=100 mg/dL  Abnormal    GLUCOSE,QUALITATIVE-URINE Negative < 50 mg/dL Negative < 50 mg/dL   KETONES,QUALITATIVE-URINE Negative < 5 mg/dL Negative < 5 mg/dL   BILIRUBIN,QUALITATIVE-URINE Negative < 2.0 mg/dL Negative < 2.0 mg/dL   UROBILINOGEN,QUALITATIVE-URINE Negative <2.0 mg/dL Negative <2.0 mg/dL   HEMOGLOBIN,QUALITATIVE-URINE Negative <0.03 mg/dL Negative <0.03 mg/dL   NITRITE,BACTERIAL-URINE Negative Negative   LEUKOCYTE ESTERASE-URINE Negative <25 WBC/uL 3+=500 WBC/uL Abnormal    WBC-URINE None / HPF, <2 / HPF > 50 / HPF Abnormal    RBC-URINE < 3 / HPF 3-10 / HPF Abnormal    SQUAMOUS EPITHELIAL CELL-URINE None / HPF, <2 / HPF 6 - 10 /HPF Abnormal    BACTERIA-URINE (none) Present Abnormal        Imaging:    03/13/22  Narrative & Impression   EXAM: CT ABDOMEN AND PELVIS WITH IV CONTRAST ONLY    CLINICAL INDICATION:  Abdominal pain, acute, nonlocalized     TECHNIQUE: Following administration of non-ionic IV contrast, postcontrast images through the abdomen and pelvis were obtained.    COMPARISON: None    FINDINGS:   Lower Thorax: Normal.    Liver: No suspicious hepatic lesions. Mild periportal edema.    Gallbladder/Biliary Tree: Excreted contrast within the gallbladder lumen    Spleen: Indeterminate hypoattenuating lesion within the spleen measuring up to 1.6 cm    Pancreas: No pancreatic ductal dilation.    Adrenal Glands: Normal.    Kidneys/Ureters: Innumerable cystic lesions within the bilateral kidneys most of which are too small to characterize. No hydronephrosis.    Gastrointestinal: No bowel obstruction. Colonic diverticulosis without inflammation.     Bladder: Decompressed.    Uterus/Adnexa: 3.0 x 1.9 cm cystic lesion of the left adnexa likely represents a benign cyst.    Lymph Nodes: No pathologically enlarged lymph nodes    Vessels: Aorta is normal in caliber. Proximal most celiac trunk and proximal most SMA are patent. Main portal vein is patent.    Peritoneum/Retroperitoneum: No organized fluid  collections.    Bones/Soft Tissues: Diffuse abdominal wall collaterals as well as partially visualized chest wall collaterals.    *  IMPRESSION:   1.    No acute intra-abdominal or intrapelvic processes.  2.    Innumerable bilateral renal cystic lesions most of which are  too small to characterize which could be secondary to polycystic renal disease. No hydronephrosis  3.    Diffuse engorged subcutaneous vessels involving the partially visualized thoracic subcutaneous soft tissues as well as the abdominal wall. This is of uncertain etiology.  4.    Additional chronic/incidental findings, as above.      Assessment and Plan   Mechille Speaker is a 36 y.o. female with a PMHx of Lupus, ESRD, Superior Vena Cava Syndrome, and HTN  admitted for abdominal pain.       #Abdominal Pain   Assessment:  - Lipase: 30, Liver Enzymes unremarkable   - Complaints of persistent nausea and abdominal pain   - CT AB Reading:   1.    No acute intra-abdominal or intrapelvic processes.  2.    Innumerable bilateral renal cystic lesions most of which are too small to characterize which could be secondary to polycystic renal disease. No hydronephrosis  3.    Diffuse engorged subcutaneous vessels involving the partially visualized thoracic subcutaneous soft tissues as well as the abdominal wall. This is of uncertain etiology.  4.    Additional chronic/incidental findings, as above.  - Patient has had several ED visits and admission   - Patient tolerated lunch today   Plan:  - Pain management 27m morphine Q6 prn   - Zofran 475mIV Q8prn fo nausea   - Will add Promethazine   - Advance diet as tolerated   - Follow up final read CTAB   - Continue to monitor     #ESRD   Assessment:  - On HD MWF endorses missing some sessions but [place]  - S/p HD today   Plan:  - Appreciate recommendations per nephrology       #SLE   Assessment:  - Does not appear to be on any specific  management  Plan:  - Can consider Rheum consult if patient fails to improve     #Hypertension   Assessment:  - Per patient is only taking Amlodipine 1065m - BP 150s/90s may be 2/2 to c/o of pain and discomfort  Plan:  - Continue home med  - Continue to monitor       #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   Assessment:  - Home Med: Apixiban 5mg47mD   Plan:  - Continue home medication     #Concern for UTI   Assessment:  - States that she has had increased frequency with occasional dysuria   - UA is LE positive   Plan:  - Continue Ceftriaxone     -----------------------------------------------------------------------------------------------  Activity                         :  As tolerated   Diet                              :  Regular   Prophylaxis                 :  On Home Anticoagulation   Consults                      :  Nephrology  Code status                 :  Full  Disposition                  :   Pending clinical advancement   Discharge planning     :   Pending    Amparo Bristol, MD  Family Medicine, PGY-3  [place] Team H  Phone: [phone]    Please call Cross Cover Mon-Fri after 3pm, and Sat-Sun after 12pm  [facility] Deliah Goody team Pray cover: [phone]   [facility] team Cross cover: [phone]   [facility] Nighttime Cross cover: [phone] (7pm-7am)     Electronically signed by Hildred Laser, MD at 03/16/2022 10:31 AM EST    Associated attestation - Hildred Laser, MD - 03/16/2022 10:31 AM EST  Formatting of this note might be different from the original.  Patient seen and examined.  The resident team and I discussed the patient and agree with assessment plan.

## 2022-03-15 NOTE — Unmapped (Signed)
Formatting of this note might be different from the original.    Problem: Knowledge Deficit  Goal: Patient will Participate in learning opportunities offered by staff  Outcome: Continue with Current Goal    Problem: Infection/Isolation (Risk/Actual)  Goal: Patient will be free of hospital acquired infection throughout hospitalization  Outcome: Continue with Current Goal    Problem: Pain/Comfort  Goal: Adequate pain control while hospitalized  Outcome: Continue with Current Goal    Problem: Fall Risk  Goal: Patient will remain as independent as possible  Outcome: Continue with Current Goal  Goal: Patient will have lower fall risk. Lower injury risk.  Outcome: Continue with Current Goal  Goal: Patient will remain safe from falls and injury  Outcome: Continue with Current Goal  Goal: Patient/family will understand fall prevention measures  Outcome: Continue with Current Goal    Electronically signed by Duayne Cal, RN at 03/15/2022  8:13 PM EST

## 2022-03-15 NOTE — Nursing Note (Signed)
Formatting of this note might be different from the original.  Pt stable and in NAD. Pt pain managed with PRN pain medication. Pt off unit for dialysis. Will transfer pt care to incoming day shift nurse.  Electronically signed by Duayne Cal, RN at 03/15/2022  7:22 AM EST

## 2022-03-15 NOTE — Progress Notes (Signed)
Formatting of this note is different from the original.    [facility] Nephrology Progress Note     Patient ID: [facility] day: 2  Reason for consult: ESRD needing HD     Medications  Scheduled meds:    heparin (porcine)  2,000 Units Intravenous Once in dialysis    heparin (porcine)  1,000 Units Intravenous Once in dialysis    epoetin alfa  4,000 Units Intravenous Once in dialysis    paricalcitol  1 mcg Intravenous Once in dialysis    normal saline  10 mL Intravenous EVERY 8 HOURS    AmLODIPine  10 mg Oral Daily    apixaban  5 mg Oral BID    cefTRIAXone  1 g IV PUSH Q24H    sevelamer carbonate  1,600 mg Oral TID w/meals     Infusion meds:   PRN meds: promethazine, morphine, albumin human, [COMPLETED] Insert Midline Catheter **AND** Maintain Midline Catheter **AND** normal saline **AND** sodium chloride, [COMPLETED] Insert Peripheral IV **AND** Maintain Peripheral IV **AND** normal saline **AND** sodium chloride, sennosides, polyethylene glycol, ondansetron    Summary Statement   Meredith Hawkins is a 36 y.o. female with a PMHx of  Lupus, ESRD, Superior Vena Cava Syndrome, and HTN  admitted for abdominal pain and missed HD session with her last session being 03/07/22 due to visiting [place] from [place] for family related issues.      Subjective     No acute events overnight or current concerns, set to receive dialysis today     Objective #2   Vital Signs  Range: Temp:  [36.7 C (98.1 F)-37.2 C (99 F)] 36.8 C (98.2 F)  Heart Rate:  [55-74] 69  Respirations:  [18] 18  Blood Pressure: (126-155)/(77-103) 152/103  Current: BP (!) 152/103   Pulse 69   Temp 36.8 C (98.2 F) (Tympanic)   Resp 18   Ht 1.676 m (5' 6"$ )   Wt 95.5 kg (210 lb 9.6 oz)   SpO2 100%   BMI 33.99 kg/m     I/O:  No intake or output data in the 24 hours ending 03/15/22 0811  No intake/output data recorded.  No intake/output data recorded.    Physical Exam  Constitutional: NAD, pleasant  Cardiovascular: RRR, normal s1s2,  no m/r/g, 2+ pulses radial/DP bilaterally, no JVD  Respiratory: CTAB, no wheezes, rales, rhonchi  Gastrointestinal: Soft, NT, ND, normoactive +BS  Neurologic: AOx4    Labs/Imaging  Recent Labs   Lab 03/13/22  0126   WBC 2.2*   HGB 10.4*   HCT 32.4*   PLT 132*   MCH 30.9   MCHC 32.1   MCV 96.0     Recent Labs   Lab 03/13/22  0126   NA 139   K 4.2   CL 97*   CO2 25   BUN 28*   CREATININE 8.3*   GFR 6*   GLU 82   CALCIUM 8.7*   CALIUMALBADJ 8.7*   LABALB 4.3   PROT 7.0   BILITOT 0.3   BILIDIR <0.1   AST 24   ALT 20   ALKPHOS 104     No results for input(s): "PT", "APTT", "INR" in the last 168 hours.  No results for input(s): "PHART", "PCO2ART", "PO2ART" in the last 72 hours.  No results for input(s): "CKMB", "TROPONINI", "BNP" in the last 72 hours.    Invalid input(s): "CPKTOTAL"    Assessment and Plan     #ESRD(MWF)  #Anemia of  Chronic Disease  #AGMA  - Pt last reported HD session on Monday. Lives in [place] and dro[place] to [place] for emergent family case.   - Access RUE AVF  - Hgb 10.4  - Ferritin 1,582  -Iron 216  - Vit D 10  -HBV surface antigen negative   - Potassium 3.5, BUN 23, Cr 6.8   Dialysis today removed 2 L    #HTN  Systolic BP ranged from [id number] to 150s   Home med: amlodipine 10 mg    #UTI  - Dysuria and increase in urinary frequency  - +UA 3+ LE, and >50 WBC, started on ceftriaxone 1g    #SLE  - Dx over 10 years ago, without any medical management since starting HD ~7 years ago  - Denies any joint paint  CT abdomen and pelvis showed Innumerable bilateral renal cystic lesions most of which are too small to characterize concerning for possible polycystic renal disease. She denied any knowledge of cysts or history of cysts in the past      Recommendations:  -HD today   -Sevelamer carbonate   - Continue home BP medications  - Renally dose medications  - Avoid nephrotoxins   - Monitor UOP  - Monitor renal function     Thank you for allowing [place] to participate in the care of this patient. Attending note to  follow.    Velta Addison, MD  PGY-1  [facility] Nephrology  03/15/2022  3:15 PM EST    Electronically signed by Rosine Door, MD at 03/15/2022  6:52 PM EST    Associated attestation - Rosine Door, MD - 03/15/2022  6:52 PM EST  Formatting of this note might be different from the original.  Nephrology Attending:    Pt seen and examined. Reviewed with Dr. Elisabeth Cara.  Agree with resident's note & findings. The assessment and plan of care reflects my direct input.  HD today- tolerated it well.    Rosine Door, [place] [facility]  [facility] # [phone]  Pager# [phone]

## 2022-03-16 NOTE — Progress Notes (Signed)
Formatting of this note is different from the original.    [facility] Nephrology Progress Note     Patient ID: [facility] day: 3  Reason for consult: ESRD needing HD     Medications  Scheduled meds:    cefdinir  300 mg Oral Every Other Day    normal saline  10 mL Intravenous EVERY 8 HOURS    AmLODIPine  10 mg Oral Daily    apixaban  5 mg Oral BID    sevelamer carbonate  1,600 mg Oral TID w/meals     Infusion meds:   PRN meds: promethazine, morphine, [COMPLETED] Insert Midline Catheter **AND** Maintain Midline Catheter **AND** normal saline **AND** sodium chloride, [COMPLETED] Insert Peripheral IV **AND** Maintain Peripheral IV **AND** normal saline **AND** sodium chloride, sennosides, polyethylene glycol, ondansetron    Summary Statement   Meredith Hawkins is a 36 y.o. female with a PMHx of  Lupus, ESRD, Superior Vena Cava Syndrome, and HTN  admitted for abdominal pain and missed HD session with her last session being 03/07/22 due to visiting [place] from [place] for family related issues.      Subjective     NAEON.    The patient was seen & assessed at the bedside she stated that she was doing well and had no complaints. She reports that she tolerated dialysis well [id number] without complications.     HD 12/26 with 2L of fluid removed.     Objective #2   Vital Signs  Range: Temp:  [36.7 C (98.1 F)-37.1 C (98.8 F)] 36.7 C (98.1 F)  Heart Rate:  [66-77] 72  Respirations:  [18-19] 19  Blood Pressure: (122-137)/(72-82) 122/72  Current: BP 122/72   Pulse 72   Temp 36.7 C (98.1 F) (Oral)   Resp 19   Ht 1.676 m (5' 6"$ )   Wt 100.5 kg (221 lb 8 oz)   SpO2 99%   BMI 35.75 kg/m     I/O:  No intake or output data in the 24 hours ending 03/16/22 1558    I/O last 3 completed shifts:  In: 500   Out: 2500   No intake/output data recorded.    Physical Exam  GENERAL: Middle age female seen laying comfortably in bed  CVS: RRR, normal s1s2, no m/r/g, 2+ pulses radial/DP bilaterally, no JVD  RESP:  CTAB, no wheezes, rales, rhonchi  GI: Soft, NT, ND, normoactive +BS  MSK: RUE AV Fistula with audible bruit and palpable thrill  NEURO: AOx4    Labs/Imaging  Recent Labs   Lab 03/13/22  0126 03/15/22  0924 03/16/22  0241   WBC 2.2* 2.2* 2.7*   HGB 10.4* 9.5* 9.2*   HCT 32.4* 28.2* 27.3*   PLT 132* 123* 118*   MCH 30.9 31.4 31.5   MCHC 32.1 33.6 33.6   MCV 96.0 94.0 94.0     Recent Labs   Lab 03/13/22  0126 03/15/22  1003 03/15/22  1004 03/16/22  0241   NA 139 135  --  135   K 4.2 3.5  --  4.3   CL 97* 93*  --  90*   CO2 25 29  --  30   BUN 28* 23*  --  31*   CREATININE 8.3* 6.8*  --  8.7*   GFR 6* 8*  --  6*   GLU 82 76  --  94   CALCIUM 8.7* 8.4* 8.4* 8.2*   CALIUMALBADJ 8.7* 8.4* 8.4* 8.2*   MG  --  1.9  --  2.2   PHOS  --  2.9  --  5.2*   LABALB 4.3 4.2 4.2 3.7   PROT 7.0 6.8  --  6.5   BILITOT 0.3 0.3  --  0.2*   BILIDIR <0.1 <0.1  --  <0.1   AST 24 21  --  22   ALT 20 20  --  21   ALKPHOS 104 107  --  109     No results for input(s): "PT", "APTT", "INR" in the last 168 hours.  No results for input(s): "PHART", "PCO2ART", "PO2ART" in the last 72 hours.  No results for input(s): "CKMB", "TROPONINI", "BNP" in the last 72 hours.    Invalid input(s): "CPKTOTAL"    Assessment and Plan     #ESRD (MWF) RUE AV Fistula   #Anemia of ESRD  #AGMA  #CKD MBD  #Secondary Hyperparathyroidism   #HTN  - Pt last reported HD session on Monday. Lives in [place] and [place] to [place] for emergent family situation.   - Access RUE AVF  - Hgb 9.2  - Ferritin 1,582, Iron 216  - Vit D 10, PTH 501.6   - HBV surface antigen negative   - Potassium 3.5, BUN 23, Cr 6.8   Last dialysis 12/26. The patient tolerated the treatment well with 2L removed     #UTI  - Dysuria and increase in urinary frequency  - +UA 3+ LE, and >50 WBC, started on ceftriaxone 1g    #SLE  - Dx over 10 years ago, without any medical management since starting HD ~7 years ago  - Denies any joint paint  CT abdomen and pelvis showed Innumerable bilateral renal cystic lesions most  of which are too small to characterize concerning for possible polycystic renal disease. She denied any knowledge of cysts or history of cysts in the past      Recommendations:  - HD MWF  - Continue home BP medications  - Renally dose medications  - Avoid nephrotoxins   - Monitor UOP  - Monitor renal function     #Hyperphosphatemia  Assessment:  Acute  Likely 2/2 ESRD  Phos 5.2  Recommendations:   - Continue Sevelamer 1614m TID      Thank you for allowing [place] participate in the care of this patient. Attending note to follow.    DAvie Echevaria MBBS  PGY-1  [facility]  03/16/2022  3:15 PM EST    Electronically signed by BRosine Door MD at 03/16/2022 10:57 PM EST    Associated attestation - BRosine Door MD - 03/16/2022 10:57 PM EST  Formatting of this note might be different from the original.  Nephrology Attending:    Pt seen and examined. Reviewed with Dr. FMarijean Bravo  Agree with resident's note & findings. The assessment and plan of care reflects my direct input.  No HD today.  Next HD on Friday.  D/C planning.    KRosine Door MFrontierNephrology  [facility]# [phone] Pager# [phone]

## 2022-03-16 NOTE — Unmapped (Signed)
Formatting of this note might be different from the original.    Problem: Increased nutrient needs (NI-5.1)  Goal: Food and/or Nutrient Delivery  Description: Individualized approach for food/nutrient provision.  Outcome: New Goal Established    Electronically signed by Phineas Semen, RD at 03/16/2022  1:12 PM EST

## 2022-03-16 NOTE — Progress Notes (Signed)
Formatting of this note might be different from the original.  Meredith Hawkins   36 y.o. female with a PMHx of  Lupus, ESRD, Superior Vena Cava Syndrome, and HTN  admitted for abdominal pain and missed HD session with her last session being 03/07/22 due to visiting [place] from [place] for family related issues.     Height: 167.6 cm (5' 6"$ )    Weight: 100.5 kg (221 lb 8 oz)   Body mass index is 35.75 kg/m.  %Weight Change: Pt denies recent wt changes.     RBW: 70 kg (BMI 24.9kg/m ABW)  EEN: 2100-2450 kcal (25-30 kcal/kg RBW)   EPN: 77-105 g (1.1-1.5 g/kg RBW)    -Needs adjusted for ESRD on HD     RD Assessment: Pt was awake and in good spirits upon visit. RD observed lunch tray being delivered to bed side. Pt says that her appetite is good however, she is currently sleepy and will eat once she has woken up more. No documentation for % of meal consumed noted via flow sheet. Pt denies N/V and chewing/swallowing issues. PTA she says that appetite was good and that she typically eats 2-3 meals per day.        Nutrition History:   Medical Tests/Procedures: Ct abdomen/pelvis   Physical Findings: NFPE performed with no muscle or fat wasting noted.   Level of Malnutrition: Nourished  Wound Assessment: No recent wounds or pressure injuries noted via avatar.   Current Nutrition Therapy: DIET REGULAR  Significant Labs: Cl 90, BUN 31, Creatinine 8.7, GFR 6, Ca 8.2, Phos 5.2, Vitamin D 10  Significant Medications: Norvasc, Renvela, Zofran-PRN     Food Insecurity: Negative Food Insecurity     Education Needs: Pt declined education at this time 2/2 just waking up.     NUTRITION DIAGNOSIS:  Increased nutrient needs (energy and protein) NI-5.1 related to altered renal function as evidence by ESRD on HD      NUTRITION INTERVENTIONS:Energy-modified diet and Protein-modified diet     PLAN:  Adjust to Renal Diet Level 2 with low phos restriction   -Encourage and  monitor PO intake   -Document % of meal consumed as able     2. Consider adding phosphate binder if pt continues to have hyperphosphatemia     Nutrition Goal: Patient to consume greater than 75% meals Throughout stay  Monitoring/Evaluation: PO intake, labs, wt, BM   Goal Outcome/Progress: New goal established    Erin Hearing MS, RDN, LD   [phone]  For weekend/holiday coverage please page [phone]  Electronically signed by Phineas Semen, RD at 03/16/2022  1:12 PM EST

## 2022-03-16 NOTE — Consults (Signed)
Formatting of this note might be different from the original.  Initial Care Management Discharge Planning Assessment    Per chart, 36 y.o. female with a PMHx of  Lupus, ESRD, Superior Vena Cava Syndrome, and HTN  admitted for abdominal pain. Patient states she has been having nausea for the last couple of days. States that she is not experiencing vomiting or diarrhea. Nausea is made worse by eating, was able to tolerate juice at bedside. Complains of supra pubic pain that wraps around to the back. Endorses some increased urinary frequency and occasional dysuria. States that she has multiple ED visits due to her chronic conditions. Last dialysis she states was "Monday". States she is visiting a sick cousin for the holidays but primary residence is in [place].     Current living environment:   Address: [place] [address]   Has address been verified? Yes    Updated Address (if applicable): N/A     Residential type: Lives alone      Legal next of kin/emergency contact: Did not provide one    Payor source (Please contact financial services to verify payor source.):   Payor 1: [place] 2 (if applicable): None  Payor 3 (if applicable): None    Services prior to admission: Paediatric nurse: HD (MWF) with [employer], [place]    High risks factors: Hospitalization related to CHF, COPD, DM, End Stage Disease, CVA, Cancer, Sickle Cell, Poor social supports, Comorbidities, and Life altering diagnosis    Current functional status: Level I: Independent. Able to complete all mobility and activities of daily living without assistance.      Do you have stairs to enter your home ? no    If so how many ?  0    Mode of transportation at discharge: Self    Where will medications be filled at discharge? (insert address/phone number of non-[facility] locations, if known): [facility]  *NOTE: If the patient does not know the actual address, a  location can be used instead (i.e. "[facility]    Discharge Planning Guide given? no    Discharge Planning Guide reviewed with: Patient    Concern about returning to current living environment? None    Projected discharge plan: Return to [place]. Per patient she has a bus ticket to [place], when asked the time on the ticket, she stated afternoon, not specific about the time.    [facility]  [address]  Rm [id number]  [place] [address]  Phone: [phone] Fax: [phone]    Yates Decamp, [place]        Electronically signed by Yates Decamp, [place] at 03/16/2022  2:37 PM EST

## 2022-03-16 NOTE — Unmapped (Signed)
Formatting of this note might be different from the original.    Problem: Pain/Comfort  Goal: Adequate pain control while hospitalized  Outcome: Continue with Current Goal    Problem: Fall Risk  Goal: Patient will remain as independent as possible  Outcome: Continue with Current Goal  Goal: Patient will have lower fall risk. Lower injury risk.  Outcome: Continue with Current Goal  Goal: Patient will remain safe from falls and injury  Outcome: Continue with Current Goal  Goal: Patient/family will understand fall prevention measures  Outcome: Continue with Current Goal  Goal: Patient/family will understand injury reduction measures  Outcome: Continue with Current Goal  Goal: Patient/family will comply with fall program  Outcome: Continue with Current Goal  Goal: Patient/family will verbalize fall prevention strategies to implement after discharge  Outcome: Continue with Current Goal    Electronically signed by Carron Curie, RN at 03/16/2022  8:28 AM EST

## 2022-03-17 NOTE — Nursing Note (Signed)
Formatting of this note might be different from the original.  Patient is discharged,discharge instructions given and explained to patient,received all prescribed medications and Peripheral IV removed Fistula to Lt arm intact, thrills and bruit present on auscultation.Patient is ambulatory and stated she has an arranged private transportation.  Electronically signed by Eli Phillips, RN at 03/17/2022  7:46 PM EST

## 2022-03-17 NOTE — Progress Notes (Signed)
Formatting of this note might be different from the original.  Patient medicated with morphine for abdominal pain and relief obtained. Fully alert , oriented x 4 . No significant changes at this moment, plan of care in progress  Laretta Alstrom, RN    Electronically signed by Laretta Alstrom, RN at 03/17/2022  2:02 AM EST

## 2022-03-17 NOTE — Unmapped (Signed)
Formatting of this note might be different from the original.    Problem: Knowledge Deficit  Goal: Patient will verbalize and/or demonstrate understanding of teaching by discharge  Outcome: Continue with Current Goal  Goal: Patient will Participate in learning opportunities offered by staff  Outcome: Continue with Current Goal    Problem: Infection/Isolation (Risk/Actual)  Goal: Patient will be free of hospital acquired infection throughout hospitalization  Outcome: Continue with Current Goal    Problem: Pain/Comfort  Goal: Adequate pain control while hospitalized  Outcome: Continue with Current Goal    Problem: Fall Risk  Goal: Patient will remain as independent as possible  Outcome: Continue with Current Goal  Goal: Patient will have lower fall risk. Lower injury risk.  Outcome: Continue with Current Goal  Goal: Patient will remain safe from falls and injury  Outcome: Continue with Current Goal  Goal: Patient/family will understand fall prevention measures  Outcome: Continue with Current Goal  Goal: Patient/family will understand injury reduction measures  Outcome: Continue with Current Goal  Goal: Patient/family will comply with fall program  Outcome: Continue with Current Goal  Goal: Patient/family will verbalize fall prevention strategies to implement after discharge  Outcome: Continue with Current Goal    Problem: Hemodialysis  Goal: Minimize complications related to the disease process.  Outcome: Continue with Current Goal  Goal: Knowledge of the prescribed therapeutic regimen will improve  Outcome: Continue with Current Goal  Goal: Remain free from infection  Outcome: Continue with Current Goal    Electronically signed by Eli Phillips, RN at 03/17/2022  6:45 PM EST

## 2022-03-17 NOTE — Unmapped (Signed)
Formatting of this note might be different from the original.    Problem: Knowledge Deficit  Goal: Patient will verbalize and/or demonstrate understanding of teaching by discharge  03/17/2022 1947 by Eli Phillips, RN  Outcome: Goal Met  03/17/2022 1845 by Eli Phillips, RN  Outcome: Continue with Current Goal  Goal: Patient will Participate in learning opportunities offered by staff  03/17/2022 1947 by Eli Phillips, RN  Outcome: Goal Met  03/17/2022 1845 by Eli Phillips, RN  Outcome: Continue with Current Goal    Problem: Infection/Isolation (Risk/Actual)  Goal: Patient will be free of hospital acquired infection throughout hospitalization  03/17/2022 1947 by Eli Phillips, RN  Outcome: Goal Met  03/17/2022 1845 by Eli Phillips, RN  Outcome: Continue with Current Goal    Problem: Pain/Comfort  Goal: Adequate pain control while hospitalized  03/17/2022 1947 by Eli Phillips, RN  Outcome: Goal Met  03/17/2022 1845 by Eli Phillips, RN  Outcome: Continue with Current Goal    Problem: Fall Risk  Goal: Patient will remain as independent as possible  03/17/2022 1947 by Eli Phillips, RN  Outcome: Goal Met  03/17/2022 1845 by Eli Phillips, RN  Outcome: Continue with Current Goal  Goal: Patient will have lower fall risk. Lower injury risk.  03/17/2022 1947 by Eli Phillips, RN  Outcome: Goal Met  03/17/2022 1845 by Eli Phillips, RN  Outcome: Continue with Current Goal  Goal: Patient will remain safe from falls and injury  03/17/2022 1947 by Eli Phillips, RN  Outcome: Goal Met  03/17/2022 1845 by Eli Phillips, RN  Outcome: Continue with Current Goal  Goal: Patient/family will understand fall prevention measures  03/17/2022 1947 by Eli Phillips, RN  Outcome: Goal Met  03/17/2022 1845 by Eli Phillips, RN  Outcome: Continue with Current Goal  Goal: Patient/family will understand injury reduction measures  03/17/2022 1947 by Eli Phillips, RN  Outcome: Goal Met  03/17/2022 1845 by Eli Phillips,  RN  Outcome: Continue with Current Goal  Goal: Patient/family will comply with fall program  03/17/2022 1947 by Eli Phillips, RN  Outcome: Goal Met  03/17/2022 1845 by Eli Phillips, RN  Outcome: Continue with Current Goal  Goal: Patient/family will verbalize fall prevention strategies to implement after discharge  03/17/2022 1947 by Eli Phillips, RN  Outcome: Goal Met  03/17/2022 1845 by Eli Phillips, RN  Outcome: Continue with Current Goal    Problem: Hemodialysis  Goal: Minimize complications related to the disease process.  03/17/2022 1947 by Eli Phillips, RN  Outcome: Goal Met  03/17/2022 1845 by Eli Phillips, RN  Outcome: Continue with Current Goal  Goal: Knowledge of the prescribed therapeutic regimen will improve  03/17/2022 1947 by Eli Phillips, RN  Outcome: Goal Met  03/17/2022 1845 by Eli Phillips, RN  Outcome: Continue with Current Goal  Goal: Remain free from infection  03/17/2022 1947 by Eli Phillips, RN  Outcome: Goal Met  03/17/2022 1845 by Eli Phillips, RN  Outcome: Continue with Current Goal    Electronically signed by Eli Phillips, RN at 03/17/2022  7:47 PM EST

## 2022-03-17 NOTE — Unmapped (Signed)
Formatting of this note might be different from the original.    PLAN OF CARE SUMMARY NOTE    NAME: Meredith Hawkins MRN: [id number] DOB: 1985/09/16    Admit date: 03/12/2022    Medical reason(s) for inpatient admission (i.e., service(s)/ treatment(s) mandating inpatient care) :   Abdominal pain  N/V        Anticipated Discharge Date: Today    Estimated length of stay for DRG(s):  4 days    Actual Length of Stay: 4 days    Discharge Diagnoses:    Abdominal pain  N/V  UTI      Medications anticipated after discharge are listed in Discharge Medication Reconciliation.       Anticipated discharge to: home        Discharge instructions include:    1. Continue your medications as discussed.  2. Follow up with your appointment dates by calling Central Scheduling at               [phone] to schedule the appointment.   3. Please see your PCP within 1-2 weeks of discharge.  4. The Advice Nurse is available 24 hours a day, seven days a week to help patients access the healthcare services they need. The center is staffed by specially trained registered nurses. Doctors are also on call around the clock to support the advice nurses. Call [phone] if you have any questions/concerns    Important numbers and information if needed:  - Blood Work:  WALK-IN to the main outpatient lab 1st floor, D wing.    From 7am-5pm Monday through Friday.  - X-RAY: WALK-IN 3rd floor radiology, D wing.     From 7am-3pm Monday through Friday.   - PFTs (pulmonary function test):  2nd floor, D wing, Room 0051.    Call Central Scheduling to make appointment:  [phone]  - CT Scan: 10th floor, B wing.    Call Central Scheduling to make appointment: [phone]  - ECHO (heart ultrasound):  2nd floor, F wing.    Call to make appointment:  [phone]  - [facility] (Main Outpatient), please call [phone] or [phone] for refill .   - Social Work questions, please call [phone].     General Instructions:   Pt instructed to show up to all  appointments.  Pt instructed on importance of medical compliance/adherence to medications and follow up appointments.  Pt instructed on proper dietary and lifestyle modifications (ie sodium restriction, cessation of tobacco, drug, ETOH or unsafe sexual practices as it applies) to enhance adequate health maintenance.  Pt advised to notify PCP or present to [facility] if symptoms recur or worsen.    Future Appointments:  Follow-up with PCP, Nephrology      Also, the following future appointments already exist:  No future appointments.    Electronically signed by  Marko Stai, MD         03/17/2022  9:09 AM EST    Electronically signed by Hildred Laser, MD at 03/17/2022 11:13 AM EST    Associated attestation - Hildred Laser, MD - 03/17/2022 11:13 AM EST  Formatting of this note might be different from the original.  Agree.

## 2022-03-17 NOTE — Progress Notes (Signed)
Formatting of this note might be different from the original.  Care Management Final Discharge Note    Discharge Disposition:    Discharge: 01 - Discharge to Home or Self Care (Routine Discharge)   Hospice:     Readmission:       1. Disposition Location: Home/Recuperative Care/Self    2. Address of Discharge Location: [place], [place] [address]    3. Nurse-to-Nurse Report Number (if applicable): N/A    4. Family Notified of Discharge Location: yes, by pt    Contact Information:     5. Transportation (please call Dispatch at [phone] and follow prompts): Self-pt has a bus ticket to get back to [place]  Time scheduled:     6. Post-Discharge Services Arranged:    Home Health? No  Home Health Provider: N/A  Clinic Appointments? Yes  TOC Clinic? Yes  DME? No  Home IV Abx? No  Equipment: N/A  DME Provider: N/A  Infusion Company: N/A    7. Choice Form Completed? N/A    8. IM Medicare Letter Completed? N/A    9. TOC Completed? Yes    10. Comments: Patient is independent and is returning to [place] by bus. Patient did not request any further assistance.     Bertram Denver, [place]    Electronically signed by Bertram Denver, MSW at 03/17/2022 10:07 AM EST

## 2022-03-17 NOTE — Unmapped (Signed)
Formatting of this note might be different from the original.    Problem: Pain/Comfort  Goal: Adequate pain control while hospitalized  Outcome: Progressing Toward Goal    Problem: Hemodialysis  Goal: Minimize complications related to the disease process.  Outcome: Progressing Toward Goal  Goal: Knowledge of the prescribed therapeutic regimen will improve  Outcome: Progressing Toward Goal  Goal: Remain free from infection  Outcome: Progressing Toward Goal    Problem: Pain/Comfort  Goal: Adequate pain control while hospitalized  Outcome: Progressing Toward Goal    Problem: Fall Risk  Goal: Patient will remain as independent as possible  Outcome: Progressing Toward Goal  Goal: Patient will have lower fall risk. Lower injury risk.  Outcome: Progressing Toward Goal  Goal: Patient will remain safe from falls and injury  Outcome: Progressing Toward Goal  Goal: Patient/family will understand fall prevention measures  Outcome: Progressing Toward Goal  Goal: Patient/family will understand injury reduction measures  Outcome: Progressing Toward Goal  Goal: Patient/family will comply with fall program  Outcome: Progressing Toward Goal  Goal: Patient/family will verbalize fall prevention strategies to implement after discharge  Outcome: Progressing Toward Goal    Electronically signed by Laretta Alstrom, RN at 03/17/2022  1:44 AM EST

## 2022-03-19 NOTE — H&P (Addendum)
[name] History and Physical   Service: [facility] Renal  [id number]     Attending: Ronie Spies, MD      Patient name: Katherine MEINDERS [place]  [name], Katherine [name]  MRN:   [id number]    Chief Complaint:     HPI:   Katherine [name] is a 36 y.o. female with past medical history of   Past Medical History   Diagnosis Date   . End stage chronic kidney disease (CMS/HCC)    . HTN (hypertension)    . Lupus (CMS/HCC)    . Superior vena cava syndrome    Katherine [name] is a 36 y.o. female with history of end stage chronic kidney disease on hemodialysis MWF. Recently here from [place] due to multiple deaths in family. Plans to go gack to [place]. Patient was in [place] [name] from 12/21-12/28 for nausea and vomiting. It was treated for a UTI. She complains of 2 days of nausea, vomiting, generalized abdominal pain, and blood in her vomit, stool, and mucus secretions from cough.  Px missed HD       ED Course:  Was seen in ED on 12/24, CT abd pelvis was negative at that time but px returned. Now admitted to [facility] for further eval and management.     Past Medical History:   Past Medical History   Diagnosis Date   . End stage chronic kidney disease (CMS/HCC)    . HTN (hypertension)    . Lupus (CMS/HCC)    . Superior vena cava syndrome        Home medications:   No current facility-administered medications on file prior to encounter.     Current Outpatient Medications on File Prior to Encounter   Medication Sig Dispense Refill   . amLODIPine (Norvasc) 10 mg tablet Take 1 tablet (10 mg) by mouth once daily. 30 tablet 0   . apixaban (Eliquis) 5 mg tablet Take 1 tablet (5 mg) by mouth twice daily. 60 tablet 0   . sevelamer carbonate (Renvela) 800 mg tablet Take 2 tablets (1,600 mg) by mouth with breakfast, with lunch, and with evening meal. Swallow tablet whole; do not crush, break, or chew. 180 tablet 0        Family History:   No family history on file.      Social History:    reports that she has never smoked. She has never been exposed  to tobacco smoke. She has never used smokeless tobacco. Drug use questions deferred to the physician. She reports that she does not drink alcohol.      Allergies:   Allergies   Allergen Reactions   . Ace Inhibitors Anaphylaxis, Rash and Unknown     Been taking losartan since 6/23 ([facility])  Pt states "all of these have closed my throat"     . Acetaminophen Anaphylaxis, Shortness of breath and Unknown     Pt states "all of these have closed my throat"     . Acetaminophen-Codeine Anaphylaxis   . Codeine Anaphylaxis and Rash     Pt states "all of these have closed my throat"     . Fentanyl Anaphylaxis, Rash and Unknown     Tolerates dilaudid  Tolerates dilaudid  Tolerates dilaudid     . Hydrocodone Anaphylaxis and Unknown     Pt states "all of these have closed my throat"  Pt has had morphine in the past, states it makes her nauseous  Tolerates hydromorphone     . Nsaids (Non-Steroidal Anti-Inflammatory  Drug) Anaphylaxis, Rash, Shortness of breath and Unknown     Pt states "all of these have closed my throat"  Renal doctor told her not to take    Tolerated ibuprofen during admission May 2018    Lupus patient; states can take phenergan Not an actual allergy, pt cannot take as a dialysis pt  Renal doctor told her not to take  Lupus patient; states can take phenergan  Not an actual allergy, pt cannot take as a dialysis pt  Tolerated ibuprofen during admission May 2018    Other reaction(s): Unknown  Not an actual allergy, pt cannot take as a dialysis pt     . Ondansetron Hcl Nausea And Vomiting and Palpitations     Pt stated Zofran causes Heart palpitations     . Oxycodone Anaphylaxis and Unknown     Pt states "all of these have closed my throat"  Tolerates dilaudid     . Oxycodone-Acetaminophen Anaphylaxis, Rash and Shortness of breath   . Sulfa (Sulfonamide Antibiotics) Anaphylaxis, Rash, Hives, Shortness of breath and Unknown     Other reaction(s): Anaphylaxis  Other reaction(s): Anaphylaxis  Other reaction(s):  Anaphylaxis     . Sulfamethoxazole-Trimethoprim Anaphylaxis and Rash   . Tylenol-Codeine Solution Anaphylaxis   . Tylenol-Codeine #2 Rash       Review of Systems:   General: No fever or chills   HEENT: No headache, vision changes, or nasal congestion   Cardiovascular: No chest pain, palpitations, or leg swelling  Pulmonary: No cough, shortness of breath, or sputum production   GI: No abdominal pain, nausea, vomiting, or diarrhea  GU: No dysuria, hematuria, urgency, or frequency   MSK: No joint pain or swelling   Neuro: No numbness, tingling, or focal weakness  Skin: No rash, no skin breakdown   Psych: No depressive or manic symptoms   Heme/Lymph: No new lymph nodes, no easy bruising     Physical Exam:   BP (!) 154/97   Pulse 79   Temp 36.7 C (98.1 F) (Oral)   Resp 18   Ht 1.676 m (5\' 6" )   SpO2 100%   BMI 27.58 kg/m  @FLOWAMB (14)@    General: NAD, well-appearing   HEENT: EOMI, PERRL  Cardiovascular: RRR, no murmurs, no LE edema, extremities warm and well perfused   Respiratory: CTAB, no crackles, no wheezes, normal WOB   Abdomen: SNTND, normoactive bowel sounds   Neuro: A&Ox4. No gross focal deficits     Labs:     CMP:   Lab Results   Component Value Date    NA 136 03/19/2022    K 5.8 (H) 03/19/2022    CL 95 (L) 03/19/2022    CO2 24 03/19/2022    BUN 85 (H) 03/19/2022    CREATININE 16.34 (H) 03/19/2022    CREATININE 7.39 (H) 03/10/2022    CREATININE 11.28 (H) 02/25/2022    GLU 72 03/19/2022    PROT 6.9 03/19/2022    BILITOT 0.2 (L) 03/19/2022    ALT 14 03/19/2022    AST 15 03/19/2022    ALKPHOS 108 (H) 03/19/2022     CBC and Coags:   Lab Results   Component Value Date    WBC 3.3 (L) 03/19/2022    EOSPCT 8 03/19/2022    BASOPCT 1 03/19/2022    LYMPHOPCT 21 03/19/2022    HGB 8.4 (L) 03/19/2022    HCT 25.7 (L) 03/19/2022    MCV 91.8 03/19/2022    PLT 156 03/19/2022  Cardiac Enzymes: No results found for: "TROPONINI", "CKTOTAL", "CKMB"    Imaging:   XR Chest 2 Views Pa + Lat Stnd Protocol  Narrative: EXAM:  XR CHEST 2 VIEWS PA + LAT STND PROTOCOL    CLINICAL INDICATION: chest pain.    ESRC.4.6.1    COMPARISON: 02/19/2022.     FINDINGS:   Support Devices: None     Lungs/Pleura: No pneumothorax. Lungs are clear. No pleural effusion.    Heart/Mediastinum: Normal    Bones/Soft tissues: No acute osseous abnormality. Vascular stents project over the right upper lung and the right axilla, unchanged.  Impression: No acute disease.       Assessment/Plan:  Katherine [name] is a 36 y.o. female with past medical history of  end stage chronic kidney disease on hemodialysis MWF. Recently moved from [place] due to family. Patient was in [place] [name] from 12/21-12/28 for nausea and vomiting. It was treated for a UTI. Px now returns to [name] for 2 days of N/V. She missed HD as well.    #Abdominal pain/ nausea/ vomiting  Uremia 2/2 missed HD vs other   CT neg 12/24  - Lipase, Liver Enzymes unremarkable   - Complaints of persistent nausea and abdominal pain   - Pain meds prn  -Clear liquids; Advance diet as tolerated   - Consider abd [place] and HIDA scan (recent CT ok)  - Continue to monitor     #ESRD   #Volume overload  #Hyperkalemia 2/2 missed HD  Renal consult unassigned Dr. 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

## 2022-03-26 NOTE — Discharge Summary (Signed)
-------------------------------------------------------------------------------  Attestation signed by Eugenia Mcalpine, MD at 03/26/2022 11:20 AM  I have seen and evaluated the patient and agree with the A/P as written below.  37 year old female presented for nausea and vomiting and missed HD.  Status post EGD with GI with gastritis noted.  Discharged on twice daily PPI and antiemetics.  Will follow-up with her primary care provider in [place].  Rest of plan as below.      Eugenia Mcalpine, MD  [facility] Medicine Team [facility] [phone]  -------------------------------------------------------------------------------    Problems, Hospital Course, Medications    Name: Katherine [name]  MRN: [id number]   DOB: 13-Dec-1985  Primary Care Provider: No primary care provider on file.  Admit Date: 03/19/2022  Discharge Date: 03/26/22     Principal Problem:  Hyperkalemia    Hospital Course by Problem List:  Katherine [name] is a 37 y.o. female with pmhx of ESRD on HD MWF, HTN, gastritis, gastroparesis, Superior vena cava syndrome, SLE who  is from [place] but states she has been in [place] visiting for funeral present with  n/v, abdominal pain and missed HD      Abdominal pain, Nausea/ vomiting- improved  Hx gastritis and gastroparesis on chart review  Complains of generalized abdominal pain, blood streaked vomitus and BRBPR on admission. Note multiple similar admissions in past at OSH review  - No emesis or blood in stool witnessed by staff during hospitalization. Hemoglobin stable   -CT A/P + MRCP and Abdominal US negative. Lipase and LFTs negative.   - Gonorrhea and chlamydia negative 12/30  - 1/5 EGD erythematous mucosa in antrum biopsied- follow up path. Negative for H pylori- no report of bleeding  -[facility] followed this admission, appreciate assistance  --->Continue PPI BID  on discharge, phenergan po prn nausea( Pt states she cannot tolerate reglan)  --->Gastroparesis diet with small  meals- tolerated meals yesterday after EGD  --->Educated on gastroparesis diet and pain medication making it worse  ---> Follow up with [facility] for EGD biopsy results    ESRD   Volume overload 2/2 missed HD  Hyperkalemia 2/2 missed HD-> resolved  [facility] managed(unassigned)  - Last HD 03/26/2022  -Pt reports she will return to her HD Clinic in [place] on MWF schedule    H/o SLE - not on meds  Hx VSD  Rheum consulted, appreciate assistance  -C3 80( Low close to norm 81-157), C4 23- WNL, DSDNA- negative, Ana negative  - TTE  Ef 60-65% mod increase wall thickness. 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      Anemia of Chronic Disease  Hgb stable  CTM     Dispo: Plan to discharge tomorrow. Patient report she has arrangement for train transport to [place] tomorrow on discharge  Discharge Medicines:     Discharge Medications  New Medications        Sig Disp Refill Start End   pantoprazole 40 mg EC tablet  Commonly known as: ProtoNix   40 mg, oral, 2 times daily, Do not crush, chew, or split.   60 tablet   0       promethazine 25 mg tablet  Commonly known as: Phenergan   25 mg, oral, Every 8 hours PRN   20 tablet   0              Medications To Continue        Sig Disp Refill Start End   amLODIPine 10 mg tablet  Commonly known as: Norvasc   10 mg, oral, Daily         apixaban 5 mg  tablet  Commonly known as: Eliquis   5 mg, oral, 2 times daily         sevelamer carbonate 800 mg tablet  Commonly known as: Renvela   1,600 mg, oral, 3 times daily with meals, Swallow tablet whole; do not crush, break, or chew.                    Additional Follow Up Items:     Additional Items for PCP/ Consultant to Follow Up:       Pending Results:  Pending Labs       Order Current Status    ANA Screen with Reflex Collected (03/21/22 1437)    Hemochromatosis Mutation, PCR In process            Follow-up Appointment Date and Time:  No future appointments.    Other Hospitalization Details    Recent Labs:    CMP:   Lab Results   Component Value Date    NA 136 03/24/2022    K 4.5 03/24/2022    CL 95 (L) 03/24/2022    CO2 26 03/24/2022    BUN 20 03/24/2022    CREATININE 7.49 (H) 03/24/2022    CREATININE 10.02 (H) 03/23/2022    CREATININE 7.89 (H) 03/22/2022    GLU 63 (L) 03/24/2022    PROT 6.9 03/19/2022    BILITOT 0.2 (L) 03/19/2022    ALT 14 03/19/2022    AST 15 03/19/2022    ALKPHOS 108 (H) 03/19/2022     CBC and Coags:   Lab Results   Component Value Date    WBC 3.2 (L) 03/24/2022    EOSPCT 11 03/23/2022    BASOPCT 1 03/23/2022    LYMPHOPCT 29 03/23/2022    HGB 10.5 (L) 03/24/2022    HCT 33.1 (L) 03/24/2022    MCV 96.8 (H) 03/24/2022    PLT 192 03/24/2022    PT 11.7 03/24/2022    INR 1.0 03/24/2022     Cardiac Enzymes: No results found for: "TROPONINI", "CKTOTAL", "CKMB"    Imaging Results:  Radiology Reports:        Echocardiogram:  Echo Transthoracic (TTE) Complete    Result Date: 03/24/2022  .  Left Ventricle: Normal size. Moderately increased wall thickness. Normal systolic function with a visually estimated EF of 60 - 65%. There is a small inlet ventricular septal defect present with bidirectional shunting. .  IVC/SVC: IVC diameter is greater than 21 mm and decreases less than 50% during inspiration; therefore the estimated right atrial pressure is elevated (~15 mmHg). .  Left Atrium: Severely dilated. .  Right  Ventricle: Severely dilated. Normal systolic function. .  Right Atrium: Mildly dilated.  Other Discharge Instructions:  Discharge instructions explained to patient and/or family, including medications,  follow up appointments, diet, activity, restrictions, signs and symptoms of concern regarding the current medical conditions. If any of these issues were to arise or worsen, the patient and/or family were instructed to contact the PCP or seek further medical evaluation in the emergency room or call 911. All verbalized understanding of instructions and denied questions or all questions answered.     Physical Exam  Last Recorded Vitals  Blood pressure 127/84, pulse 72, temperature 36.9 C (98.4 F), temperature source Oral, resp. rate 16, height 1.676 m (5\' 6" ), weight 77.1 kg (170 lb), last menstrual period 02/22/2022, SpO2 100%. Body mass index is 27.44 kg/m.    General: NAD, well-appearing  HEENT: EOMI, PERRL, MMM  Cardiovascular: RRR, Systolic murmurs, no LE edema, extremities warm and well perfused   Musculoskeletal: RUE Hd access + thrill  Respiratory: CTAB, no crackles or wheezes, normal work of breathing on room air  Abdomen: SNTND, + bowel sounds  Neuro: A&Ox4.     Time Spent for Discharge: >35 minutes    Pricilla Holm, NP-BC  Attending: Eugenia Mcalpine, MD  Division of [facility] Medicine

## 2022-04-13 NOTE — ED Provider Notes (Signed)
[facility] EMERGENCY DEPT    ED Provider Note  History     Chief Complaint   Patient presents with   . "breast and facial swelling" with pain     History of Present Illness  HPI    37F w PMH of ESRD on HD MWF, HTN, gastritis, gastroparesis, Superior vena cava syndrome, SLE.    CC: Breast pain, back pain, abdominal pain for 2 days.    History by patient.  She reports she has recurrent pain in the areas above, which have become worse in the last 2 days.  Also reports chest pain, but signals to breasts when indicating where.  Also nausea and vomiting NBNB.  Makes urine.  Denies current or recent fevers, SOB, urinary or BM changes, falls or injuries, rash.  Leg swelling at baseline.  Last dialysis Monday, as scheduled, and is due for today.  Later also reported to RN that she was having discharge under her breasts.    Past Medical History:   Diagnosis Date   . Anemia    . Asthma without status asthmaticus    . Chronic abdominal pain, unspecified    . Encounter for blood transfusion    . GERD (gastroesophageal reflux disease)    . History of blood transfusion    . Hypertension    . Kidney disease    . Lupus (CMS-HCC)    . Lupus (CMS-HCC)    . Lupus nephritis (CMS-HCC)    . Poor intravenous access    . VSD (ventricular septal defect)      Past Surgical History:   Procedure Laterality Date   . infected blood clot  11/2014   . OPEN ANASTAMOSIS ARTERIOVENOUS BY UPPER ARM VEIN TRANSPOSITION Left 02/03/2015    Procedure: ARTERIOVENOUS ANASTOMOSIS, OPEN; BY UPPER ARM BASILIC VEIN TRANSPOSITION STAGE I;  Surgeon: Alita Chyle, MD;  Location: [place];  Service: General Surgery;  Laterality: Left;   . OPEN ANASTAMOSIS ARTERIOVENOUS BY UPPER ARM VEIN TRANSPOSITION Left 03/24/2015    Procedure: ARTERIOVENOUS ANASTOMOSIS, OPEN; BY UPPER ARM BASILIC VEIN TRANSPOSITION STAGE II;  Surgeon: Alita Chyle, MD;  Location: [place];  Service: General Surgery;  Laterality: Left;   . CREATION ARTERIOVENOUS FISTULA Right 05/01/2015     Procedure: CREATION OF BRACHIOBASILIC ARTERIOVENOUS FISTULA ;  Surgeon: Alita Chyle, MD;  Location: [place];  Service: General Surgery;  Laterality: Right;   . OPEN ANASTAMOSIS ARTERIOVENOUS BY UPPER ARM VEIN TRANSPOSITION Right 06/12/2015    Procedure: ARTERIOVENOUS ANASTOMOSIS, OPEN; BY UPPER ARM BASILIC VEIN TRANSPOSITION, Stage II;  Surgeon: Alita Chyle, MD;  Location: [place];  Service: General Surgery;  Laterality: Right;   . hematoma removal  07/2017    right chest   . REVISION ARTERIOVENOUS FISTULA Right 04/19/2018    Procedure: AVF Revision for aneurysm repair;  Surgeon: Alita Chyle, MD;  Location: [place];  Service: General Surgery;  Laterality: Right;   . dialysis perm cath Left    . port a cath Right     right chest - Dec. 2014, left chest June 2016, Sept. 2016 x2     Family History   Problem Relation Age of Onset   . Diabetes type II Mother    . Diabetes type II Father    . Coronary Artery Disease (Blocked arteries around heart) Neg Hx    . Lupus Neg Hx    . Anesthesia problems Neg Hx      Social History  Socioeconomic History   . Marital status: Single   . Number of children: 0   Occupational History   . Occupation: Disable   Tobacco Use   . Smoking status: Never   . Smokeless tobacco: Never   Vaping Use   . Vaping Use: Never used   Substance and Sexual Activity   . Alcohol use: No   . Drug use: No   . Sexual activity: Never   Social History Narrative    Single. Lives alone in [place], [place]. On disability. 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     Social Determinants of Health     Financial Resource Strain: Low Risk  (11/05/2020)    Overall Financial Resource Strain (CARDIA)    . Difficulty of Paying Living Expenses: Not very hard   Food Insecurity: No Food Insecurity (11/05/2020)    Hunger Vital Sign    . Worried About Charity fundraiser in the Last Year: Never true    . Ran Out of Food in the Last Year: Never true   Transportation Needs:  No Transportation Needs (04/06/2021)    PRAPARE - Transportation    . Lack of Transportation (Medical): No    . Lack of Transportation (Non-Medical): No   Stress: Stress Concern Present (11/05/2020)    [place]    . Feeling of Stress : To some extent   Housing Stability: Unknown (11/05/2020)    Housing Stability Vital Sign    . Unable to Pay for Housing in the Last Year: No    . Unstable Housing in the Last Year: No         Physical Exam   BP (!) 154/94 (BP Location: Left upper arm, Patient Position: Lying)   Pulse 84   Temp 36.7 C (98.1 F) (Tympanic)   Resp 16   Ht 167.6 cm (5' 6"$ )   Wt 80.2 kg (176 lb 12.9 oz)   LMP 03/25/2022 (Exact Date)   SpO2 100%   BMI 28.54 kg/m   Physical Exam  GEN: well-appearing without external signs of any distress. Speaking at normal conversational pace and affect. Cooperative.  HEENT: atraumatic. Oral no exudate. MM moist and anicteric.  NECK: ROM wnl. No LAD. No JVD.  CV: Radial and DP pulses full and symmetric. Cap refill [id number]. 4/6 pansystolic murmur (congenital VSD per patient).  RESP: Resp effort wnl, symmetric expansion. Lungs CTAB.  ABD: soft, nontender, no guarding. No CVA tenderness.  PELVIC/RECTAL/GU: deferred.  MSK: Disproportionate reaction to light touching of BL lower back, no percussion performed. No skin lesions or erythema, full ROM and mobility of back. Joints w nl ROM, no swelling. No deformities.  SKIN: BL breast folds with sweat. Mild erythema under L breast fold without sattelite lesions. Otherwise no rashes. No cyanosis.  EXT: Trace BLE edema.  PSYCH: Behavior appropriate. Attention preserved.  NEURO: AO. Speech fluent. Pupils equal, EOM wnl. Face symmetric. Moves 4 extremities symmetrically. Balance wnl upright.      Medical Decision Making     Assessment and Plan:      #Pain, vomiting: not localizable to a specific source and chronic-recurrent in nature.  No e/o sepsis, hemodynamic,  respiratory or neurologic compromise.  Has reported allergies to everything but Dilaudid for pain, and everything but Phenergan for nausea.  Given PO doses of each, the patient stated she threw them up immediately.  Given IV Phenergan, deferred Dilaudid given no  e/o acute disease.  Obtained labs and CXR which have no signs of new process or decompensation.  Plan to DC.  Signed out to oncoming team.  Differential Diagnosis:     In addition to the differential diagnoses listed, other diagnoses were considered.    Medical Complexity:     New and requires workup.      Pertinent labs & imaging results that were available during my care of the patient were reviewed by me and considered in my medical decision making.      I reviewed previous medical records.      I independently visualized image(s), tracing(s), and/or specimen(s).        ED Course as of 04/15/22 1921   Wed Apr 13, 2022   0500 Triage note:  Feeling of bilateral breast and facial swelling x2 days.  States "constant pain" to breasts, bilateral flanks, and abd x2 days.  Came to ER for eval. [LT]   0500 BP(!): 161/110  Other vs wnl  [LT]      ED Course User Index  [LT] Tarabal Da Barkley Bruns, MD         Medications Administered in the Emergency Department     HYDROmorphone (DILAUDID) tablet 2 mg (2 mg Oral Given 04/13/22 0557)  promethazine (PHENERGAN) tablet 25 mg (25 mg Oral Given 04/13/22 0608)  promethazine in 0.9 % NaCl (PHENERGAN) 12.5 mg/50 mL IVPB 12.5 mg (0 mg Intravenous Stopped 04/13/22 0945)     ED Clinical Impression   1. Generalized abdominal pain         ED Disposition   Discharge                   Tarabal Da Barkley Bruns, MD  Resident  04/15/22 [phone]

## 2022-04-15 NOTE — ED Provider Notes (Signed)
[facility] Emergency Department Provider Note  Room: D 34/D [name] is a 37 y.o. female who presents with recurrent nausea and vomiting and also complaining of some drainage and pain underneath her left breast.  She has had extensive workup for her nausea and vomiting including recent CT imaging, endoscopy, ultrasound, and MRI.  I do not feel she needs any further workup for this if her labs are reassuring.  She was nebulous about her history and recent visits and workup at [facility] in [place].   She is requesting opiates which have declined as I see no indication for them currently.  She is also requesting Phenergan.  Will give this orally but risk of IV Phenergan outweighs any potential benefit.    In regards to her breast.  She does have candidal intertriginous changes.  There is no evidence of cellulitis or abscess but she does have some mild skin breakdown medially.  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    Progress Notes     9:40 AM  Patient left AGAINST MEDICAL ADVICE when I was in another room.  She did not wait for repeat evaluation.  Her labs had not yet resulted.  I had previously discussed with her about using a topical medication for yeast.  She did receive Diflucan here.  It looks like her CBC is stable.  Potassium was hemolyzed.  I did not have a chance to discuss risks and benefits that she left prior to an opportunity for me to do so.  I had discussed with her that it was important for her to go to dialysis today.             MDM Elements           Escalation of Care including OBS/Admission/Transfer was considered: However, patient was determined to be appropriate for outpatient management. See progress note for additional detail.                   Disposition     Clinical Impression:       Diagnosis Comment Added By Time Added    Nausea and vomiting, unspecified vomiting type  Marissa Nestle, MD 04/15/2022  8:43 AM     Candidal intertrigo  Marissa Nestle, MD 04/15/2022  8:43 AM    ESRD on hemodialysis (CMS/HCC)  Marissa Nestle, MD 04/15/2022  8:43 AM          Final Disposition: AMA (SAW PROVIDER)      History     Chief Complaint in Triage   Patient presents with   . Abdominal Pain     3 days of abdominal pain with emesis and blood clots in her vomit. She also states she has swollen bilateral breast and left breast is leaking. Pt also complains of rectal bleeding. Pt gets dialysis MWF. Last dialysis was Wed        HPI:  Meredith Hawkins is a 37 y.o. female who presents to the emergency department with nausea and vomiting.  She reports to me this started 3 days ago.  She reports streaks of blood in her vomit.  No diarrhea.  She tells me this just started 3 days ago but on review of epic records I can see this has been a recurrent issue for her.  She has had extensive workup in the past month for this including multiple CT scans, ultrasound,  MRI, and endoscopy.      She has secondary complaint of pain and fluid leakage underneath her left breast.  No fever.    She reports she had full dialysis treatment on Wednesday.  She is due for dialysis this morning.    Additional Historian(s): None         MEDICATIONS:   Discharge Medication List as of 04/15/2022  9:34 AM        CONTINUE these medications which have NOT CHANGED    Details   albuterol-ipratropium (COMBIVENT) 18-103 mcg/actuation inhaler Inhale 2 puffs every 4 (four) hours as needed for wheezing or shortness of breath., Starting Wed 03/06/2013, Historical Med      amLODIPine (NORVASC) 10 MG tablet Take 1 tablet (10 mg total) by mouth daily., Historical Med      calcium acetate,phosphat bind, (PHOSLO) 667 mg capsule Take 2 capsules (1,334 mg total) by mouth 3 (three) times a day with meals., Historical Med      calcium carbonate (TUMS) 200 mg calcium (500 mg) chewable tablet Chew 2 tablets (1,000 mg total) See Admin Instructions., Historical Med      hydrALAZINE  (APRESOLINE) 25 MG tablet Take 1 tablet by mouth 3 times a day., Starting Tue 08/03/2021, Until Wed 08/03/2022, Normal      metoprolol succinate (TOPROL-XL) 25 MG 24 hr tablet Take 1 tablet (25 mg total) by mouth daily., Starting Fri 06/11/2021, Historical Med      pantoprazole (PROTONIX) 40 MG tablet Take 1 tablet (40 mg total) by mouth daily., Starting Wed 04/22/2020, Until Thu 04/22/2021, Normal             ALLERGIES:   Allergies   Allergen Reactions   . Ace Inhibitors Anaphylaxis   . Acetaminophen Anaphylaxis   . Enalapril Maleate Anaphylaxis   . Fentanyl Anaphylaxis     Tolerates dilaudid     . Hydrocodone Anaphylaxis     Pt has had morphine in the past, states it makes her nauseous   . Oxycodone Anaphylaxis   . Oxycodone-Acetaminophen Anaphylaxis   . Sulfa (Sulfonamide Antibiotics) Anaphylaxis and Other (See Comments)     Other reaction(s): Anaphylaxis   . Sulfamethoxazole-Trimethoprim Anaphylaxis   . Tylenol-Codeine Solution Anaphylaxis   . Nsaids (Non-Steroidal Anti-Inflammatory Drug) Rash     Lupus patient; states can take phenergan  Not an actual allergy, pt cannot take as a dialysis pt       PAST MEDICAL HISTORY:    Past Medical History:   Diagnosis Date   . Asthma    . Dialysis patient (CMS/HCC)    . Hepatitis B surface antigen positive 02/08/2022    negative 02/02/2022 per care everywhere   . Hypertension    . Hypoglycemia 04/22/2020   . Lupus (CMS/HCC)    . Renal disorder    . Superior vena cava syndrome    . VSD (ventricular septal defect and aortic arch hypoplasia        PAST SURGICAL HISTORY:   Past Surgical History:   Procedure Laterality Date   . hematoma removal     . INTERVENTIONAL RADIOLOGY VASCULAR  04/25/2019   . removal chest port         SOCIAL HISTORY:   Social History     Tobacco Use   . Smoking status: Never   . Smokeless tobacco: Never   Vaping Use   . Vaping status: Never Used   Substance and Sexual Activity   . Alcohol use: No   . Drug  use: No   . Sexual activity: Not Currently     Birth  control/protection: Abstinence        Social Determinants of Health with Concerns     Intimate Partner Violence: Unknown (12/13/2019)     FAMILY HISTORY:    Family History   Problem Relation Age of Onset   . Diabetes Mother    . Diabetes Father           Physical Exam      ED Triage Vitals [04/15/22 0812]   Temp 98.5 F (36.9 C)   Temp Source Oral   Heart Rate 79   BP (!) 166/100   Respirations 16   SpO2 100 %     Reviewed vital signs and nursing note as charted by RN.    Adult Medical Physical Exam  Reviewed vital signs and nursing note as charted by RN.    CONSTITUTIONAL: Well-appearing; well-nourished. Alert and oriented and responds appropriately to questions.   HEAD: Normocephalic; atraumatic  EYES: PERRL; Conjunctivae clear, sclerae non-icteric  ENT: Normal nose; no rhinorrhea; moist mucous membranes  NECK: Supple without meningismus; nontender; no masses  CARD: Regular rate and rhythm; no murmurs, no clicks, no rubs, no gallops; symmetric distal pulses  RESP: Normal chest excursion without splinting or tachypnea; breath sounds clear and equal bilaterally; no wheezes, no rhonchi, no rales   ABD/GI: Nondistended; soft, nontender, no rebound, no guarding  BACK:  Nontender to palpation  EXT: Normal ROM in all joints; nontender to palpation; no cyanosis, no effusions, no edema     SKIN: Normal color for age and race; warm; dry; good turgor; capillary refill < 2 seconds; there is candidal intertrigo underneath the left breast  NEURO: Moves all extremities equally; Motor and sensory function intact   PSYCH: The patient's mood and manner are appropriate. Grooming and personal hygiene are appropriate.         Results      I personally reviewed the results in the chart as listed below    Results for orders placed or performed during the hospital encounter of 04/15/22   CBC with Differential   Result Value Ref Range    Differential Percent Diff %     Differential Absolute Diff Absolute     WBC 3.9 3.6 - 11.2 K/uL    RBC  2.82 (L) 3.63 - 4.92 M/uL    Hemoglobin 8.9 (L) 10.9 - 14.3 g/dL    Hematocrit 27 (L) 31 - 42 %    Mean Cell Volume 95 74 - 96 fL    Mean Cell Hemoglobin 31 24 - 33 pg    Mean Cell Hemoglobin Concentration 33 33 - 36 g/dL    RDW 17.8 (H) 12.3 - 17.0 %    Platelet Count 147 (L) 150 - 450 K/uL    Mean Platelet Volume 8.4 7.5 - 11.2 fL    Neutrophils 64 43 - 77 %    Lymphocytes 16 16 - 44 %    Monocytes 13 5 - 13 %    Eosinophils 5 1 - 8 %    Basophils 2 (H) 0 - 1 %    Neutrophils Absolute 2.4 1.8 - 7.8 K/uL    Lymphocytes Absolute 0.6 (L) 1.0 - 3.0 K/uL    Monocytes Absolute 0.5 0.3 - 1.0 K/uL    Eosinophils Absolute 0.2 0.0 - 0.5 K/uL    Basophils Absolute 0.1 0.0 - 0.1 K/uL    Nucleated RBCS 0 /100 WBC  CMP   Result Value Ref Range    Sodium See Text 136 - 145 mmol/L    Potassium See Text 3.5 - 5.1 mmol/L    Chloride 100 99 - 108 mmol/L    CO2 20 (L) 21 - 31 mmol/L    BUN 73 (H) 7 - 25 mg/dL    Creatinine 13.63 (H) 0.51 - 1.00 mg/dL    Glucose, Random 61 (L) 70 - 199 mg/dL    Calcium, Total 8.2 (L) 8.8 - 10.6 mg/dL    Osmolality (calculated) See Text 270 - 295 mOsm/kg    Anion Gap See Text 3 - 11    Albumin 4.0 3.5 - 5.7 g/dL    Bilirubin, Total 0.5 0.3 - 1.0 mg/dL    Alkaline Phosphatase 88 34 - 104 IU/L    ALT 12 7 - 52 IU/L    AST See Text 13 - 39 IU/L    Protein, Total 7.1 6.4 - 8.9 g/dL    Albumin/Globulin Ratio 1.3 1.2 - 2.3   PT/INR   Result Value Ref Range    PT 13.2 (H) 9.9 - 12.7 sec    INR 1.16 <1.30   eGFR (CKD-EPI)   Result Value Ref Range    eGFR 3 (A) mL/min/1.65m     Imaging Results    None       ECG Results    None               MMarissa Nestle MD  04/15/22 [phone]

## 2022-05-07 ENCOUNTER — Emergency Department: Admit: 2022-05-07 | Payer: MEDICAID

## 2022-05-07 ENCOUNTER — Inpatient Hospital Stay
Admit: 2022-05-07 | Discharge: 2022-05-08 | Disposition: A | Discharge status: Other Acute Inpatient Hospital | Payer: MEDICAID

## 2022-05-07 DIAGNOSIS — E875 Hyperkalemia: Secondary | ICD-10-CM

## 2022-05-07 DIAGNOSIS — N2889 Other specified disorders of kidney and ureter: Secondary | ICD-10-CM

## 2022-05-07 DIAGNOSIS — N186 End stage renal disease: Secondary | ICD-10-CM

## 2022-05-07 DIAGNOSIS — Z992 Dependence on renal dialysis: Secondary | ICD-10-CM

## 2022-05-07 LAB — COMPREHENSIVE METABOLIC PANEL
ALT: 6 U/L — ABNORMAL LOW (ref 12–78)
AST: 22 U/L (ref 15–37)
Albumin/Globulin Ratio: 0.8 — ABNORMAL LOW (ref 1.1–2.2)
Albumin: 3.3 g/dL — ABNORMAL LOW (ref 3.5–5.0)
Alk Phosphatase: 140 U/L — ABNORMAL HIGH (ref 45–117)
Anion Gap: 8 mmol/L (ref 5–15)
BUN: 40 MG/DL — ABNORMAL HIGH (ref 6–20)
Bun/Cre Ratio: 6 — ABNORMAL LOW (ref 12–20)
CO2: 28 mmol/L (ref 21–32)
Calcium: 9 MG/DL (ref 8.5–10.1)
Chloride: 97 mmol/L (ref 97–108)
Creatinine: 6.32 MG/DL — ABNORMAL HIGH (ref 0.55–1.02)
Est, Glom Filt Rate: 8 mL/min/{1.73_m2} — ABNORMAL LOW (ref >60)
Globulin: 4.2 g/dL — ABNORMAL HIGH (ref 2.0–4.0)
Glucose: 81 mg/dL (ref 65–100)
Potassium: 6.5 mmol/L — ABNORMAL HIGH (ref 3.5–5.1)
Sodium: 133 mmol/L — ABNORMAL LOW (ref 136–145)
Total Bilirubin: 0.4 MG/DL (ref 0.2–1.0)
Total Protein: 7.5 g/dL (ref 6.4–8.2)

## 2022-05-07 LAB — CBC WITH AUTO DIFFERENTIAL
Absolute Immature Granulocyte: 0.1 10*3/uL — ABNORMAL HIGH (ref 0.00–0.04)
Basophils %: 1 % (ref 0–1)
Basophils Absolute: 0.1 10*3/uL (ref 0.0–0.1)
Eosinophils %: 4 % (ref 0–7)
Eosinophils Absolute: 0.2 10*3/uL (ref 0.0–0.4)
Hematocrit: 23.9 % — ABNORMAL LOW (ref 35.0–47.0)
Hemoglobin: 7.5 g/dL — ABNORMAL LOW (ref 11.5–16.0)
Immature Granulocytes: 1 % — ABNORMAL HIGH (ref 0.0–0.5)
Lymphocytes %: 12 % (ref 12–49)
Lymphocytes Absolute: 0.7 10*3/uL — ABNORMAL LOW (ref 0.8–3.5)
MCH: 29.5 PG (ref 26.0–34.0)
MCHC: 31.4 g/dL (ref 30.0–36.5)
MCV: 94.1 FL (ref 80.0–99.0)
MPV: 9.4 FL (ref 8.9–12.9)
Monocytes %: 11 % (ref 5–13)
Monocytes Absolute: 0.6 10*3/uL (ref 0.0–1.0)
Neutrophils %: 71 % (ref 32–75)
Neutrophils Absolute: 4 10*3/uL (ref 1.8–8.0)
Nucleated RBCs: 0 PER 100 WBC
Platelets: 279 10*3/uL (ref 150–400)
RBC: 2.54 M/uL — ABNORMAL LOW (ref 3.80–5.20)
RDW: 16 % — ABNORMAL HIGH (ref 11.5–14.5)
WBC: 5.7 10*3/uL (ref 3.6–11.0)
nRBC: 0 10*3/uL (ref 0.00–0.01)

## 2022-05-07 LAB — POCT GLUCOSE
POC Glucose: 115 mg/dL (ref 65–117)
POC Glucose: 140 mg/dL — ABNORMAL HIGH (ref 65–117)
POC Glucose: 29 mg/dL — CL (ref 65–117)
POC Glucose: 37 mg/dL — CL (ref 65–117)
POC Glucose: 84 mg/dL (ref 65–117)

## 2022-05-07 LAB — LIPASE: Lipase: 66 U/L (ref 13–75)

## 2022-05-07 LAB — POTASSIUM: Potassium: 5 mmol/L (ref 3.5–5.1)

## 2022-05-07 MED ORDER — INSULIN REGULAR HUMAN 100 UNIT/ML IJ SOLN
100 | Freq: Three times a day (TID) | INTRAMUSCULAR | Status: DC
Start: 2022-05-07 — End: 2022-05-07

## 2022-05-07 MED ORDER — CALCIUM GLUCONATE 10 % IV SOLN
10 | Freq: Once | INTRAVENOUS | Status: AC
Start: 2022-05-07 — End: 2022-05-07
  Administered 2022-05-07: 21:00:00 1000 mg via INTRAVENOUS

## 2022-05-07 MED ORDER — MORPHINE SULFATE (PF) 2 MG/ML IV SOLN
2 | INTRAVENOUS | Status: AC
Start: 2022-05-07 — End: 2022-05-07
  Administered 2022-05-07: 20:00:00 1 mg via INTRAVENOUS

## 2022-05-07 MED ORDER — SODIUM BICARBONATE 8.4 % IV SOLN
8.4 | INTRAVENOUS | Status: AC
Start: 2022-05-07 — End: 2022-05-07
  Administered 2022-05-07: 21:00:00 50 meq via INTRAVENOUS

## 2022-05-07 MED ORDER — DEXTROSE 50 % IV SOLN
50 | INTRAVENOUS | Status: AC
Start: 2022-05-07 — End: 2022-05-07
  Administered 2022-05-07: 25 via INTRAVENOUS

## 2022-05-07 MED ORDER — DEXTROSE 5 % AND 0.9 % NACL IV BOLUS
Freq: Once | INTRAVENOUS | Status: DC
Start: 2022-05-07 — End: 2022-05-07

## 2022-05-07 MED ORDER — DEXTROSE 50 % IV SOLN
50 | INTRAVENOUS | Status: AC
Start: 2022-05-07 — End: 2022-05-07
  Administered 2022-05-07: 21:00:00 50 g via INTRAVENOUS

## 2022-05-07 MED ORDER — ALBUTEROL SULFATE (2.5 MG/3ML) 0.083% IN NEBU
RESPIRATORY_TRACT | Status: AC
Start: 2022-05-07 — End: 2022-05-07
  Administered 2022-05-07: 21:00:00 5 mg via RESPIRATORY_TRACT

## 2022-05-07 MED ORDER — DEXTROSE 50 % IV SOLN
50 | Freq: Once | INTRAVENOUS | Status: AC
Start: 2022-05-07 — End: 2022-05-07
  Administered 2022-05-08: 25 g via INTRAVENOUS

## 2022-05-07 MED ORDER — INSULIN REGULAR HUMAN 100 UNIT/ML IJ SOLN
100 | Freq: Once | INTRAMUSCULAR | Status: AC
Start: 2022-05-07 — End: 2022-05-07
  Administered 2022-05-07: 21:00:00 8 [IU] via INTRAVENOUS

## 2022-05-07 MED ORDER — DEXTROSE 50 % IV SOLN
50 | INTRAVENOUS | Status: DC | PRN
Start: 2022-05-07 — End: 2022-05-07

## 2022-05-07 MED ORDER — PROMETHAZINE HCL 25 MG PO TABS
25 | ORAL | Status: AC
Start: 2022-05-07 — End: 2022-05-07
  Administered 2022-05-07: 20:00:00 25 mg via ORAL

## 2022-05-07 MED ORDER — DEXTROSE 5 % AND 0.9 % NACL IV BOLUS
Freq: Once | INTRAVENOUS | Status: AC
Start: 2022-05-07 — End: 2022-05-07
  Administered 2022-05-07: 500 mL via INTRAVENOUS

## 2022-05-07 MED ORDER — SODIUM BICARBONATE 8.4 % IV SOLN
8.4 | INTRAVENOUS | Status: DC
Start: 2022-05-07 — End: 2022-05-07

## 2022-05-07 MED FILL — DEXTROSE 50 % IV SOLN: 50 % | INTRAVENOUS | Qty: 50

## 2022-05-07 MED FILL — DEXTROSE-NACL 5-0.9 % IV SOLN: INTRAVENOUS | Qty: 1000

## 2022-05-07 MED FILL — CALCIUM GLUCONATE 10 % IV SOLN: 10 % | INTRAVENOUS | Qty: 10

## 2022-05-07 MED FILL — ALBUTEROL SULFATE (2.5 MG/3ML) 0.083% IN NEBU: RESPIRATORY_TRACT | Qty: 6

## 2022-05-07 MED FILL — SODIUM BICARBONATE 8.4 % IV SOLN: 8.4 % | INTRAVENOUS | Qty: 50

## 2022-05-07 MED FILL — PROMETHAZINE HCL 25 MG PO TABS: 25 MG | ORAL | Qty: 1

## 2022-05-07 MED FILL — MORPHINE SULFATE (PF) 2 MG/ML IV SOLN: 2 mg/mL | INTRAVENOUS | Qty: 1

## 2022-05-07 MED FILL — HUMULIN R 100 UNIT/ML IJ SOLN: 100 UNIT/ML | INTRAMUSCULAR | Qty: 8

## 2022-05-07 NOTE — ED Provider Notes (Signed)
[facility] EMERGENCY DEPT  EMERGENCY DEPARTMENT ENCOUNTER       Pt Name: Meredith Hawkins  MRN: [id number]  [place] October 09, 1985  Date of evaluation: 05/07/2022  Provider: Jeneen Rinks, APRN - NP   PCP: No primary care provider on file.  Note Started: 6:56 PM 05/07/22     CHIEF COMPLAINT       Chief Complaint   Patient presents with    Flank Pain        HISTORY OF PRESENT ILLNESS: 1 or more elements      History Provided by: Patient   History is limited by: Nothing     Meredith Hawkins is a 37 y.o. female who presents ambulatory to the emergency department with  multiple complaints.  Patient states that she is from [facility].  States that she is here visiting family.  States that she is a dialysis patient has end-stage renal disease and her last dialysis was on Monday.  States that she has pain extending from her breast down to her upper and lower abdomen.  States she has a history of pancreatitis.  Patient reports nausea.  States she has been vomiting.  She denies diarrhea.  She denies fever.  States she pain radiates to her right and left flank.  On chart review from [facility] noted patient was left AMA at her last visit, that she had been evaluated multiple times for abdominal pain.  Patient states pain does radiate to her chest.  She denies numbness or tingling she denies headache.       Nursing Notes were all reviewed and agreed with or any disagreements were addressed in the HPI.     REVIEW OF SYSTEMS      Review of Systems   Constitutional:  Negative for fever.   HENT:  Negative for congestion.    Eyes:  Negative for visual disturbance.   Respiratory:  Negative for shortness of breath.    Cardiovascular:  Positive for chest pain.   Gastrointestinal:  Positive for abdominal pain.   Genitourinary:  Positive for flank pain. Negative for difficulty urinating.   Musculoskeletal:  Negative for back pain and neck pain.   Skin:  Negative for rash.   Neurological:  Negative for dizziness, weakness  and headaches.   All other systems reviewed and are negative.       Positives and Pertinent negatives as per HPI.    PAST HISTORY     Past Medical History:  Past Medical History:   Diagnosis Date    Asthma     Lupus ([place])     VSD (ventricular septal defect)        Past Surgical History:  History reviewed. No pertinent surgical history.    Family History:  History reviewed. No pertinent family history.    Social History:  Social History     Tobacco Use    Smoking status: Never    Smokeless tobacco: Never   Substance Use Topics    Alcohol use: Never    Drug use: Never       Allergies:  Allergies   Allergen Reactions    Ace Inhibitors Anaphylaxis     Pt states "all of these have closed my throat"    Acetaminophen Anaphylaxis     Pt states "all of these have closed my throat"    Codeine Anaphylaxis     Pt states "all of these have closed my throat"    Hydrocodone Anaphylaxis     Pt  states "all of these have closed my throat"    Nsaids Anaphylaxis     Pt states "all of these have closed my throat"    Oxycodone Anaphylaxis     Pt states "all of these have closed my throat"    Sulfa Antibiotics Anaphylaxis       CURRENT MEDICATIONS      Previous Medications    No medications on file       PHYSICAL EXAM      Vitals:    05/07/22 1205 05/07/22 1612 05/07/22 1700 05/07/22 1732   BP: 131/74 139/72 129/78 131/86   Pulse: 82 97 (!) 114 (!) 109   Resp: 18 23 15 18   $ Temp: 98.3 F (36.8 C)      TempSrc: Oral      SpO2: 95% 95% 100% 96%   Weight: 77 kg (169 lb 12.1 oz)      Height: 1.676 m (5' 6"$ )          Physical Exam  Vitals and nursing note reviewed.   Constitutional:       Appearance: Normal appearance.   HENT:      Right Ear: External ear normal.      Left Ear: External ear normal.      Nose: Nose normal.      Mouth/Throat:      Mouth: Mucous membranes are moist.   Eyes:      Pupils: Pupils are equal, round, and reactive to light.   Cardiovascular:      Rate and Rhythm: Regular rhythm.   Pulmonary:      Effort: Pulmonary  effort is normal.   Abdominal:      Palpations: Abdomen is soft.   Musculoskeletal:         General: Normal range of motion.      Cervical back: Normal range of motion and neck supple.      Comments: Shunt right upper arm positive bruit   Skin:     General: Skin is warm.   Neurological:      General: No focal deficit present.      Mental Status: She is alert.   Psychiatric:         Mood and Affect: Mood normal.          DIAGNOSTIC RESULTS   LABS:     Recent Results (from the past 12 hour(s))   EKG 12 Lead    Collection Time: 05/07/22  1:17 PM   Result Value Ref Range    Ventricular Rate 78 BPM    Atrial Rate 78 BPM    P-R Interval 234 ms    QRS Duration 104 ms    Q-T Interval 386 ms    QTc Calculation (Bazett) 440 ms    P Axis 41 degrees    R Axis 20 degrees    T Axis 53 degrees    Diagnosis       Sinus rhythm with 1st degree AV block  No previous ECGs available     CBC with Auto Differential    Collection Time: 05/07/22  2:32 PM   Result Value Ref Range    WBC 5.7 3.6 - 11.0 K/uL    RBC 2.54 (L) 3.80 - 5.20 M/uL    Hemoglobin 7.5 (L) 11.5 - 16.0 g/dL    Hematocrit 23.9 (L) 35.0 - 47.0 %    MCV 94.1 80.0 - 99.0 FL    MCH 29.5 26.0 - 34.0 PG  MCHC 31.4 30.0 - 36.5 g/dL    RDW 16.0 (H) 11.5 - 14.5 %    Platelets 279 150 - 400 K/uL    MPV 9.4 8.9 - 12.9 FL    Nucleated RBCs 0.0 0 PER 100 WBC    nRBC 0.00 0.00 - 0.01 K/uL    Neutrophils % 71 32 - 75 %    Lymphocytes % 12 12 - 49 %    Monocytes % 11 5 - 13 %    Eosinophils % 4 0 - 7 %    Basophils % 1 0 - 1 %    Immature Granulocytes 1 (H) 0.0 - 0.5 %    Neutrophils Absolute 4.0 1.8 - 8.0 K/UL    Lymphocytes Absolute 0.7 (L) 0.8 - 3.5 K/UL    Monocytes Absolute 0.6 0.0 - 1.0 K/UL    Eosinophils Absolute 0.2 0.0 - 0.4 K/UL    Basophils Absolute 0.1 0.0 - 0.1 K/UL    Absolute Immature Granulocyte 0.1 (H) 0.00 - 0.04 K/UL    Differential Type SMEAR SCANNED      RBC Comment HYPOCHROMIA  1+        RBC Comment BURR CELLS  1+       Lipase    Collection Time: 05/07/22  2:32 PM    Result Value Ref Range    Lipase 66 13 - 75 U/L   CMP    Collection Time: 05/07/22  2:32 PM   Result Value Ref Range    Sodium 133 (L) 136 - 145 mmol/L    Potassium 6.5 (H) 3.5 - 5.1 mmol/L    Chloride 97 97 - 108 mmol/L    CO2 28 21 - 32 mmol/L    Anion Gap 8 5 - 15 mmol/L    Glucose 81 65 - 100 mg/dL    BUN 40 (H) 6 - 20 MG/DL    Creatinine 6.32 (H) 0.55 - 1.02 MG/DL    Bun/Cre Ratio 6 (L) 12 - 20      Est, Glom Filt Rate 8 (L) >60 ml/min/1.28m    Calcium 9.0 8.5 - 10.1 MG/DL    Total Bilirubin 0.4 0.2 - 1.0 MG/DL    ALT 6 (L) 12 - 78 U/L    AST 22 15 - 37 U/L    Alk Phosphatase 140 (H) 45 - 117 U/L    Total Protein 7.5 6.4 - 8.2 g/dL    Albumin 3.3 (L) 3.5 - 5.0 g/dL    Globulin 4.2 (H) 2.0 - 4.0 g/dL    Albumin/Globulin Ratio 0.8 (L) 1.1 - 2.2     POCT Glucose    Collection Time: 05/07/22  3:53 PM   Result Value Ref Range    POC Glucose 84 65 - 117 mg/dL    Performed by: WWilfred LacyRN    POCT Glucose    Collection Time: 05/07/22  5:01 PM   Result Value Ref Range    POC Glucose 140 (H) 65 - 117 mg/dL    Performed by: WWilfred LacyRN    Potassium    Collection Time: 05/07/22  5:06 PM   Result Value Ref Range    Potassium 5.0 3.5 - 5.1 mmol/L   POCT Glucose    Collection Time: 05/07/22  6:43 PM   Result Value Ref Range    POC Glucose 29 (LL) 65 - 117 mg/dL    Performed by: [employer]    POCT Glucose    Collection Time: 05/07/22  6:45 PM   Result Value Ref Range    POC Glucose 37 (LL) 65 - 117 mg/dL    Performed by: [employer]    POCT Glucose    Collection Time: 05/07/22  6:54 PM   Result Value Ref Range    POC Glucose 115 65 - 117 mg/dL    Performed by: Wilfred Lacy RN        EKG: When ordered, EKG's are interpreted by the Emergency Department Physician in the absence of a cardiologist.  Please see their note for interpretation of EKG.      RADIOLOGY:  Non-plain film images such as CT, Ultrasound and MRI are read by the radiologist. Plain radiographic images are visualized and preliminarily interpreted by  the ED Provider with the below findings:          Interpretation per the Radiologist below, if available at the time of this note:     XR CHEST PORTABLE   Final Result   No evidence of acute cardiopulmonary process.               PROCEDURES   Unless otherwise noted below, none  Critical Care    Performed by: Ottie Glazier, APRN - NP  Authorized by: Ottie Glazier, APRN - NP    Critical care provider statement:     Critical care time (minutes):  30    Critical care start time:  05/07/2022 3:00 PM    Critical care end time:  05/07/2022 3:30 PM    Critical care time was exclusive of:  Separately billable procedures and treating other patients    Critical care was necessary to treat or prevent imminent or life-threatening deterioration of the following conditions:  Renal failure, respiratory failure, dehydration, endocrine crisis, circulatory failure, cardiac failure and metabolic crisis    Critical care was time spent personally by me on the following activities:  Blood draw for specimens, discussions with consultants, examination of patient, ordering and performing treatments and interventions, ordering and review of laboratory studies, ordering and review of radiographic studies, pulse oximetry, re-evaluation of patient's condition and review of old charts    I assumed direction of critical care for this patient from another provider in my specialty: no      Care discussed with: accepting provider at another facility         [place] and DIFFERENTIAL DIAGNOSIS/MDM   Vitals:    Vitals:    05/07/22 1205 05/07/22 1612 05/07/22 1700 05/07/22 1732   BP: 131/74 139/72 129/78 131/86   Pulse: 82 97 (!) 114 (!) 109   Resp: 18 23 15 18   $ Temp: 98.3 F (36.8 C)      TempSrc: Oral      SpO2: 95% 95% 100% 96%   Weight: 77 kg (169 lb 12.1 oz)      Height: 1.676 m (5' 6"$ )           Patient was given the following medications:  Medications   dextrose 5 % and 0.9 % nacl bolus (0 mLs  IntraVENous Held 05/07/22 1855)   dextrose 50 % IV solution (has no administration in time range)   morphine (PF) injection 1 mg (1 mg IntraVENous Given 05/07/22 1507)   promethazine (PHENERGAN) tablet 25 mg (25 mg Oral Given 05/07/22 1508)   albuterol (PROVENTIL) (2.5 MG/3ML) 0.083% nebulizer solution 5 mg (5 mg Nebulization Given 05/07/22 1548)   sodium bicarbonate  8.4 % injection 50 mEq (50 mEq IntraVENous Given 05/07/22 1546)   dextrose 50 % IV solution (50 g IntraVENous Given 05/07/22 1548)   calcium gluconate 10 % injection 1,000 mg (1,000 mg IntraVENous Given 05/07/22 1556)   insulin regular (HUMULIN R;NOVOLIN R) injection 8 Units (8 Units IntraVENous Given 05/07/22 1554)       CONSULTS: (Who and What was discussed)  None    Chronic Conditions: End-stage renal disease on dialysis hypertension Lupus    ADDITIONAL CONSIDERATIONS: Patient does not have dialysis on Wednesday and Friday as scheduled patient is from out of town.      RECORDS REVIEWED: Nursing Notes and Old Medical Records    CC/HPI Summary, DDx, ED Course, and Reassessment: 20-year-old female presents with abdominal pain breast pain which radiates to her upper chest.  Reports history of hypertension and lupus end-stage renal disease on dialysis missed dialysis Wednesday and Friday because she is visiting from out of town.  States has been vomiting denies diarrhea denies fever.  States pain radiates to her flank.  Physical exam  37 year old she is seated upright on stretcher lungs are clear bilaterally tender lower abdomen.  Patient also reports pain under her breast.  No rash no erythema no chest wall tenderness.  I am concerned due to patient's not having dialysis will need to transfer patient will order lipase also due to patient reporting history of pancreatitis patient has been requesting Dilaudid for pain will reassess medicate as needed.    ED Course as of 05/07/22 1856   Sat May 07, 2022   1247 I have reviewed the chart from [facility] for visit on  January 26.  Patient left AMA at that visit [AN]   1411 EKG done at 1317: Sinus rhythm with first-degree AV block, rate 78, PR interval 234 ms.  Peaked T waves, no T-segment elevation or depression concerning for ischemia.  Axis, no ectopy, no obvious ischemia.  Independently interpreted by me [SS]   1540 Patient potassium 6.5.  She does have changes of hyperkalemia on her EKG.  We are ordering bicarb, insulin and D50, albuterol neb.  Will also give calcium gluconate [SS]   1541 Potassium is 6.5 patient has not had dialysis since Monday.  Dextrose bicarb and insulin nebulizer treatment have been ordered [AN]   1542 I have discussed with patient she will need dialysis.  She will be transferred to another hospital.  Patient in agreement with transfer. [AN]   1600 Spoke with Pharmacy re calcium gluconate 10% solution 25m. She put the correct order in as a verbal order for me [SS]   1848 BS now 29. Getting amp d50 and we are hanging gentle bolus of D5NS for transpot [SS]   [id number] blood glucose is now 29.  Patient was given 1 amp of dextrose she is now alert and oriented knows skin warm and dry.  [facility] [AN]   1855 Blood glucose is now 115.  Patient drinking orange juice. [AN]      ED Course User Index  [AN] NOttie Glazier APRN - NP  [SS] SElvin So MD       Disposition Considerations (Tests not done, Shared Decision Making, Pt Expectation of Test or Tx.):      FINAL IMPRESSION     [place]    2. Hyperkalemia          DISPOSITION/PLAN   DISPOSITION Decision To Transfer 05/07/2022  03:55:23 PM      Transfer: The patient is being transferred to [facility] emergency department for dialysis. The results of their tests and reasons for their transfer have been discussed with the patient and/or available family. The patient/family has conveyed agreement and understanding for the need to be admitted and for their admission diagnosis.  Consultation has been made with Dr. Georgie Chard, who agrees to accept the transfer.      PATIENT REFERRED TO:  No follow-up provider specified.     DISCHARGE MEDICATIONS:     Medication List      You have not been prescribed any medications.           DISCONTINUED MEDICATIONS:  There are no discharge medications for this patient.      I have seen and evaluated this patient with my supervising physician, No att. providers found. Please also see physician documentation.     I am the Primary Clinician of Record.   Jeneen Rinks, APRN - NP (electronically signed)    (Please note that parts of this dictation were completed with voice recognition software. Quite often unanticipated grammatical, syntax, homophones, and other interpretive errors are inadvertently transcribed by the computer software. Please disregards these errors. Please excuse any errors that have escaped final proofreading.)         Ottie Glazier, APRN - NP  05/07/22 1857       Ottie Glazier, APRN - NP  05/07/22 1857

## 2022-05-07 NOTE — ED Notes (Signed)
Transfer initiated. Pt stable upon transfer. Report given to EMS. EMTALA signed.

## 2022-05-07 NOTE — H&P (Signed)
History and Physical    Date of Service:  05/07/2022  Primary Care Provider: No primary care provider on file.  Source of information: The patient and Chart review    Chief Complaint: Abdominal pain, nausea, and vomiting      History of Presenting Illness:   Meredith Hawkins is a 37 y.o. 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  Per chart review, patient is native of [facility] [place] [facility] and is visiting family.  Per chart review she had recent visit at [facility] in [facility] where she left AMA.  She reports has multiple visits for abdominal pain in the past.  She has multiple drug allergies to multiple pain medications including acetaminophen, NSAIDs, codeine, hydrocodone, oxycodone with anaphylactic reaction.  Today she initially went to [facility] ED with aforementioned complaints, pain which remain constant, severe, without specific alleviating factors, initially located in chest and breasts, radiating to bilateral flank and extending to lower abdomen, with onset starting about 3 days ago, without specific alleviating factors, with associated nausea and vomiting.  Patient reportedly admits her to prior hemodialysis sessions with last occurring on Monday, 05/02/2022.  On arrival at [facility] ED, workup included labs which were abnormal for hemoglobin 7.5, sodium 133, potassium 6.5, BUN 40, creatinine 6.32, GFR 8, albumin 3.3, alkaline phosphatase 140.  12 EKG shows sinus rhythm, first-degree AV block at 70 bpm.  POC glucose levels were 84 and  140.  Patient was given regular insulin 8 units IV x 1, D50, 50 g IV x 1, sodium bicarb and 8.4%, 50 mEq IV x 1, albuterol nebulizer 5 mg x 1, Phenergan 25 mg p.o. x 1, and morphine 1 mg IV x 1 dose.  Repeat K = 5.0.  Repeat POC glucose = 29.  Per chart review, patient was given D50 25 g IV x 1 and started on D5 normal saline IV at 125 mL/h x 2 hours.  Request was for transfer to [facility] for hemodialysis treatment.  On arrival here, patient now complaining of continued intractable pain as described above and requesting pain medications for the same.       REVIEW OF SYSTEMS:  A comprehensive review of systems was negative except for that written in the History of Present Illness.     Past Medical History:   Diagnosis Date    Asthma     Lupus ([place])     VSD (ventricular septal defect)       Medications:  Prior to Admission medications    Not on File     Allergies   Allergen Reactions    Ace Inhibitors Anaphylaxis     Pt states "all of these have closed my throat"    Acetaminophen Anaphylaxis     Pt states "all of these have closed my throat"    Codeine Anaphylaxis     Pt states "all of these have closed my throat"    Hydrocodone Anaphylaxis     Pt states "all of these have closed my throat"    Nsaids Anaphylaxis     Pt states "all of these have closed my throat"    Oxycodone Anaphylaxis  Pt states "all of these have closed my throat"    Sulfa Antibiotics Anaphylaxis      Family history:  Stroke                        grandfather  Breast cancer            aunt    Social History:    Smoking: Denies  Alcohol: Denies  Illicit drugs: Denies  Social Determinants of Health     Tobacco Use: Low Risk  (05/07/2022)    Patient History     Smoking Tobacco Use: Never     Smokeless Tobacco Use: Never     Passive Exposure: Not on file   Alcohol Use: Not At Risk (05/07/2022)    AUDIT-C     Frequency of Alcohol Consumption: Never     Average Number of Drinks: Patient does not drink     Frequency of Binge Drinking: Never    Emergency planning/management officer Strain: Not on file   Food Insecurity: Not on file   Transportation Needs: Not on file   Physical Activity: Not on file   Stress: Not on file   Social Connections: Not on file   Intimate Partner Violence: Not on file   Depression: Not on file   Housing Stability: Not on file   Interpersonal Safety: Not on file   Utilities: Not on file        Medications were reconciled to the best of my ability given all available resources at the time of admission. Route is PO if not otherwise noted.     Family and social history were personally reviewed, all pertinent and relevant details are outlined as above.    Objective:   BP 128/92   Pulse 87   Temp 98 F (36.7 C) (Oral)   Resp 20   Ht 1.676 m (5' 6"$ )   LMP 04/25/2022 (Exact Date)   SpO2 100% on room air  BMI 27.40 kg/m         PHYSICAL EXAM:   General: Patient in no acute respiratory distress.  HEENT: Normocephalic, atraumatic, no otorrhea, no rhinorrhea, nares patent, PEERL, EOMI, moist mucus membranes.  Tongue midline/nonedematous.  Neck: Supple, no JVD, no meningeal signs.  No carotid bruits.  Trachea midline.  Hypervascular nonpulsatile chronic appearance across anterior neck and supraclavicular region  Respiratory/ Chest: Clear to auscultation bilaterally   CVS: Regular rate.  Regular rhythm.  Normal S1-S2.  Pansystolic blowing murmur  GI/ Abd: Soft.  Obese.  Nondistended.  Generalized tenderness to light palpation.  Voluntary guarding.  No rebound or rigidity.  Normoactive bowel sounds.  No auscultated abdominal BUEs or palpable abdominal mass.  Musculoskeletal/Ext: Right arm AV fistula with palpable thrill.  Nonpitting edema about lower extremities..  No acute bony/soft tissue deformity.  Chronic joint deformity with large left foot bunion.  Neuro: GCS 15.  Moves all extremities x 4.  Generalized weakness strength 4 5 bilateral upper/lower extremities.  No slurred speech.  No facial droop.  Sensation grossly intact.    Psych: A&Ox4.     Cap refill: Brisk, less than 3 seconds  Vascular/ Pulses: 2+ radial, 1+ dorsalis pedis pulse bilateral.  Integument/ Skin: Warm, dry, without rashes or lesions except hard calluses present on plantar surface of both feet.    Data Review:   I have independently reviewed and interpreted patient's lab and all other diagnostic data     Latest Reference Range & Units 05/07/22  14:32 05/07/22 15:53 05/07/22 17:01 05/07/22 17:06   Sodium 136 - 145 mmol/L 133 (L)      Potassium 3.5 - 5.1 mmol/L 6.5 (H)   5.0   Chloride 97 - 108 mmol/L 97      CO2 21 - 32 mmol/L 28      BUN,BUNPL 6 - 20 MG/DL 40 (H)      Creatinine 0.55 - 1.02 MG/DL 6.32 (H)      Bun/Cre Ratio 12 - 20   6 (L)      Anion Gap 5 - 15 mmol/L 8      Est, Glom Filt Rate >60 ml/min/1.20m 8 (L)      Glucose, Random 65 - 100 mg/dL 81      POC Glucose 65 - 117 mg/dL  84 140 (H)    CALCIUM, SERUM, 500694 8.5 - 10.1 MG/DL 9.0      ALBUMIN/GLOBULIN RATIO 1.1 - 2.2   0.8 (L)      Total Protein 6.4 - 8.2 g/dL 7.5      Albumin 3.5 - 5.0 g/dL 3.3 (L)      Globulin 2.0 - 4.0 g/dL 4.2 (H)      Alk Phos 45 - 117 U/L 140 (H)      ALT 12 - 78 U/L 6 (L)      AST 15 - 37 U/L 22      BILIRUBIN TOTAL 0.2 - 1.0 MG/DL 0.4      Lipase 13 - 75 U/L 66      WBC 3.6 - 11.0 K/uL 5.7      RBC 3.80 - 5.20 M/uL 2.54 (L)      Hemoglobin Quant 11.5 - 16.0 g/dL 7.5 (L)      Hematocrit 35.0 - 47.0 % 23.9 (L)      MCV 80.0 - 99.0 FL 94.1      MCH 26.0 - 34.0 PG 29.5      MCHC 30.0 - 36.5 g/dL 31.4      MPV 8.9 - 12.9 FL 9.4      RDW 11.5 - 14.5 % 16.0 (H)      Platelet Count 150 - 400 K/uL 279      Neutrophils % 32 - 75 % 71      Lymphocyte % 12 - 49 % 12      Monocytes % 5 - 13 % 11      Eosinophils % 0 - 7 % 4      Basophils % 0 - 1 % 1      Neutrophils Absolute 1.8 - 8.0 K/UL 4.0      Lymphocytes Absolute 0.8 - 3.5 K/UL 0.7 (L)      Monocytes Absolute 0.0 - 1.0 K/UL 0.6      Eosinophils Absolute 0.0 - 0.4 K/UL 0.2      Basophils Absolute 0.0 - 0.1 K/UL 0.1      Differential Type -   SMEAR  SCANNED      Immature Granulocytes 0.0 - 0.5 % 1 (H)      Nucleated Red Blood Cells 0 PER 100 WBC  0.00 - 0.01 K/uL 0.0  0.00      Absolute Immature Granulocyte 0.00 - 0.04 K/UL 0.1 (H)      RBC Comment -    -   HYPOCHROMIA  1+    BURR CELLS  1+        (L): Data is abnormally low  (H): Data is abnormally high    @  MICRORESULTS@    IMAGING:   No orders to display      Chest x-ray portable:  FINDINGS: Single AP portable view of the chest obtained at 1656 demonstrates a  stable cardiomediastinal silhouette. There is chronic cardiomegaly. There is a  right subclavian vascular stent. The lungs are clear bilaterally. No osseous  abnormalities are seen.     IMPRESSION:  No evidence of acute cardiopulmonary process.    ECG/ECHO:  @LASTCARDIOLOGY$ @       Notes reviewed from all clinical/nonclinical/nursing services involved in patient's clinical care. Care coordination discussions were held with appropriate clinical/nonclinical/ nursing providers based on care coordination needs.     Assessment:   Given the patient's current clinical presentation, there is a high level of concern for decompensation if discharged from the emergency department. Complex decision making was performed, which includes reviewing the patient's available past medical records, laboratory results, and imaging studies.    Principal Problem:    ESRD on hemodialysis ([place])  Resolved Problems:    * No resolved hospital problems. *      Plan:     Intractable abdominal pain        Intractable back pain        Bilateral flank pain  -Uncertain etiology  -admit to telemetry  -order stat CT abdomen/ pelvis without IV contrast; no contrast due to ESRD  -NPO  -consult GI if pain unresolved  -ordered Morphine 4 mg IV q 4 hours prn   -multiple drug allergies to: Acetaminophen, NSAIDs, hydrocodone, codeine, and oxycodone  -History of pancreatitis in the past but current lipase level =66 is within normal limits    2.  ESRD needing hemodialysis  -Patient missed last 2  hemodialysis days on Wednesday and Friday; last treatment  05/02/2022  -Normal hemodialysis schedule Monday Wednesday Friday  -Nephrology service consulted and provided some recommendations/ orders  -Plan for hemodialysis with [facility] once dialysis nurse available  -Repeat renal panel in a.m.    3.  Acute hyperkalemia  -Initial K6.5, corrected to 5.0 after interventions noted  -Repeat K level in a.m.    4.  Hyperglycemia  -After receiving regular insulin for hyperkalemia treatment  -POC glucose levels have increased and patient is asymptomatic from the same  -Continue POC glucose levels every hour x 4 occurrences to ensure stable blood glucose levels  -Place on neurochecks    5.  Acute chest pain  -History of ventricular septal defect (VSD)  -Heart murmurs  -Order/repeat/trend high-sensitivity troponin levels  -Continuous cardiac monitoring    6.  Anemia of chronic disease  -Hemoglobin 7.5  -Order repeat H&H  -Order iron profile, B12, folate level  -Order fecal occult blood test  -Transfuse if hemoglobin less than 7.0    7.  Hematemesis  -Reported per patient  -Plan as above  -Order Protonix 40 mg IV daily    8.  Superior vena cava syndrome/thrombosis  -On long-term anticoagulation with Eliquis  -Hold Eliquis for now due to patient's reports of hematemesis and anemia  -If if no hematemesis overnight and hemoglobin is stable in a.m., then consider for resuming [place]    9.  Essential hypertension  -continue Amlodipine    10.  Asthma  -not in acute exacerbation  -may have Albuterol nebulizer prn    11.  History of lupus  -not currently on any medication for the same    12.  Elevated alkaline phosphatase  -Alkaline phosphatase 140  -Repeat alkaline phosphatase levels and LFTs in a.m.  13.  First-degree AV block  -Continuous telemetry monitoring    14.  Edema both lower extremities  -Keep legs elevated at rest  -Order venous duplex of bilateral lower extremities        DIET: NPO  ISOLATION PRECAUTIONS: No active  isolations  CODE STATUS: Full Code   Central Line:   none  DVT PROPHYLAXIS: SCDs  FUNCTIONAL STATUS PRIOR TO HOSPITALIZATION: Fully active and ambulatory; able to carry on all self-care without restriction.  Ambulatory status/function: By self   EARLY MOBILITY ASSESSMENT: Recommend routine ambulation while hospitalized with the assistance of nursing staff  ANTICIPATED DISCHARGE: Greater than 48 hours.  ANTICIPATED DISPOSITION: Home  EMERGENCY CONTACT/SURROGATE DECISION MAKER:   Vickii Chafe  [phone]    CRITICAL CARE WAS PERFORMED FOR THIS ENCOUNTER: NO.    ADVANCED DIRECTIVE/ CODE STATUS:  FULL CODE as per discussion with patient.   Signed By: Ruta Hinds, MD     May 07, 2022         Please note that this dictation may have been completed with Dragon, the computer voice recognition software.  Quite often unanticipated grammatical, syntax, homophones, and other interpretive errors are inadvertently transcribed by the computer software.  Please disregard these errors.  Please excuse any errors that have escaped final proofreading.

## 2022-05-07 NOTE — ED Notes (Signed)
During RN medication administration of 68m of Morphine and 233mof Phenergan, pt states, "You guys are very skimpy with pain medications around here. Not everyone is seeking pain medications." RN encouraged that many providers start with a lower dose of pain medications and can then increase dose if needed. When RN was giving Morphine patient asked, "Why are you pushing it so slow." RN states that many patients have different tolerance levels to pain medications and that I was taught to always push narcotic medications slow.

## 2022-05-07 NOTE — ED Notes (Addendum)
Pt presents ambulatory to ED complaining of bilateral flank pain with increased pain on left flank, nausea, vomiting with blood, and diarrhea since Monday. Pt reports she attended dialysis M/W/F. Pt denies missing any dialysis treatments. Pt is alert and oriented x 4, RR even and unlabored, skin is warm and dry. Assesment completed and pt updated on plan of care.       Emergency Department Nursing Plan of Care       The Nursing Plan of Care is developed from the Nursing assessment and Emergency Department Attending provider initial evaluation.  The plan of care may be reviewed in the "ED Provider note".    The Plan of Care was developed with the following considerations:   Patient / Family readiness to learn indicated [id number] understanding  Persons(s) to be included in education: patient  Barriers to Learning/Limitations:None    Signed

## 2022-05-07 NOTE — ED Notes (Signed)
TRANSFER - OUT REPORT:    Verbal report given to [facility] on [employer] being transferred to [facility] ED for urgent transfer       Report consisted of patient's Situation, Background, Assessment and   Recommendations(SBAR).     Information from the following report(s) Nurse Handoff Report was reviewed with the receiving nurse.    Kinder Fall Assessment:    Presents to emergency department  because of falls (Syncope, seizure, or loss of consciousness): No  Age > 70: No  Altered Mental Status, Intoxication with alcohol or substance confusion (Disorientation, impaired judgment, poor safety awaremess, or inability to follow instructions): No  Impaired Mobility: Ambulates or transfers with assistive devices or assistance; Unable to ambulate or transer.: No  Nursing Judgement: No          Lines:   Peripheral IV 05/07/22 Left (Active)        Opportunity for questions and clarification was provided.

## 2022-05-07 NOTE — Consults (Addendum)
[place]         NAME:Meredith Hawkins  [id number]   DOB:1985-06-10     ESRD on HD, Missed 2 HD treatments    ESRD on HD MWF  Hyperkalemia  - 6.5 to 5.o post K shifting medications  Anemia of chronic disease  -Hb of 7.5  Hypoglycemia  - resolved  SLE    PLAN  HD orders placed - 2K bath, 3.5 hrs, 2L UF: [facility] notified  Give 15k of retacrit with HD  Check labs now and trend daily renal indices          ESRD on hemodialysis ([place]) [id number], Z99.2]     Erenest Blank, [facility]

## 2022-05-07 NOTE — ED Provider Notes (Signed)
[facility] EMERGENCY DEP  EMERGENCY DEPARTMENT ENCOUNTER      Pt Name: Meredith Hawkins  MRN: [id number]  [place] 1986-03-13  Date of evaluation: 05/07/2022  Provider: Andrey Farmer, MD    CHIEF COMPLAINT       Chief Complaint   Patient presents with    Transfer     Nephrology consult for dialysis from [facility]    Abdominal Pain    Nausea         HISTORY OF PRESENT ILLNESS   (Location/Symptom, Timing/Onset, Context/Setting, Quality, Duration, Modifying Factors, Severity)  Note limiting factors.   Patient is a 37 year old female present emergency department via transfer from [facility] for dialysis.  Patient is from [place] seen at [facility] has been appear at her last dialysis was Monday of last week.  She typically has dialysis on Monday, Wednesday, Friday.  [place] [facility] transfer the patient to [facility] as they do not have the ability to do dialysis on the weekends in the emergency department.            Review of External Medical Records:     Nursing Notes were reviewed.    REVIEW OF SYSTEMS    (2-9 systems for level 4, 10 or more for level 5)     Review of Systems    Except as noted above the remainder of the review of systems was reviewed and negative.       PAST MEDICAL HISTORY     Past Medical History:   Diagnosis Date    Asthma     Lupus ([place])     VSD (ventricular septal defect)          SURGICAL HISTORY     No past surgical history on file.      CURRENT MEDICATIONS       Previous Medications    No medications on file       ALLERGIES     Ace inhibitors, Acetaminophen, Codeine, Hydrocodone, Nsaids, Oxycodone, and Sulfa antibiotics    FAMILY HISTORY     No family history on file.       SOCIAL HISTORY       Social History     Socioeconomic History    Marital status: Single   Tobacco Use    Smoking status: Never    Smokeless tobacco: Never   Substance and Sexual Activity    Alcohol use: Never    Drug use: Never           PHYSICAL EXAM    (up to 7 for level 4, 8 or more for level 5)      ED Triage Vitals [05/07/22 1949]   BP Temp Temp Source Pulse Respirations SpO2 Height Weight   128/88 98 F (36.7 C) Oral 84 17 98 % 1.676 m (5' 6"$ ) --       Body mass index is 27.4 kg/m.    Physical Exam  Vitals and nursing note reviewed.   Constitutional:       Appearance: Normal appearance.   HENT:      Head: Normocephalic and atraumatic.      Nose: Nose normal.   Eyes:      Extraocular Movements: Extraocular movements intact.      Pupils: Pupils are equal, round, and reactive to light.   Cardiovascular:      Rate and Rhythm: Normal rate and regular rhythm.      Pulses: Normal pulses.      Heart sounds: Normal  heart sounds.   Pulmonary:      Effort: Pulmonary effort is normal.      Breath sounds: Normal breath sounds.   Abdominal:      General: Bowel sounds are normal.      Palpations: Abdomen is soft.   Musculoskeletal:         General: Normal range of motion.      Cervical back: Normal range of motion and neck supple.   Skin:     General: Skin is warm and dry.   Neurological:      General: No focal deficit present.      Mental Status: She is alert and oriented to person, place, and time. Mental status is at baseline.   Psychiatric:         Mood and Affect: Mood normal.         Behavior: Behavior normal.         DIAGNOSTIC RESULTS     EKG: All EKG's are interpreted by the Emergency Department Physician who either signs or Co-signs this chart in the absence of a cardiologist.        RADIOLOGY:   Non-plain film images such as CT, Ultrasound and MRI are read by the radiologist. Plain radiographic images are visualized and preliminarily interpreted by the emergency physician with the below findings:        Interpretation per the Radiologist below, if available at the time of this note:    No orders to display        LABS:  Labs Reviewed   POCT GLUCOSE       All other labs were within normal range or not returned as of this dictation.    EMERGENCY DEPARTMENT COURSE and DIFFERENTIAL DIAGNOSIS/MDM:   Vitals:     Vitals:    05/07/22 1949 05/07/22 1954   BP: 128/88 128/88   Pulse: 84 84   Resp: 17 18   Temp: 98 F (36.7 C)    TempSrc: Oral    SpO2: 98%    Height: 1.676 m (5' 6"$ )            Medical Decision Making  End-stage renal disease, hyperkalemia.  37 year old female present emergency department for hyperkalemia that was treated at [facility] patient is in no acute distress at this time.  Patient will require dialysis spoke to nephrology Dr. Gordy Levan who states that they will have dialysis team see the patient will likely be late tonight or early in the morning.    Amount and/or Complexity of Data Reviewed  Labs: ordered.    Risk  Decision regarding hospitalization.            REASSESSMENT            CONSULTS:  IP CONSULT TO NEPHROLOGY    PROCEDURES:  Unless otherwise noted below, none     Procedures      FINAL IMPRESSION      1. ESRD (end stage renal disease) ([place])    2. Hyperkalemia          DISPOSITION/PLAN   DISPOSITION Decision To Admit 05/07/2022 08:29:50 PM      PATIENT REFERRED TO:  No follow-up provider specified.    DISCHARGE MEDICATIONS:  New Prescriptions    No medications on file         (Please note that portions of this note were completed with a voice recognition program.  Efforts were made to edit the dictations but occasionally words are mis-transcribed.)  Andrey Farmer, MD (electronically signed)  Emergency Attending Physician / Physician Assistant / Nurse Practitioner      Perfect Serve Consult for Admission  8:38 PM    ED Room Number: ER05/05  Patient Name and age:  Meredith Hawkins 37 y.o.  female  Working Diagnosis:   1. ESRD (end stage renal disease) ([place])    2. Hyperkalemia        COVID-19 Suspicion: No  Sepsis present:  No  Reassessment needed: No  Code Status:  Full Code  Readmission: No  Isolation Requirements: no  Recommended Level of Care: telemetry  Department: [facility] Adult ED - [phone]  Consulting Provider: Nephrology           Andrey Farmer, MD  05/07/22  2040

## 2022-05-07 NOTE — ED Triage Notes (Signed)
Patient presents to ED with c/o abdominal pain for 3 days. Has missed the last two dialysis treatment

## 2022-05-07 NOTE — ED Triage Notes (Signed)
Pt arrives via EMS from [facility] as transfer for nephrology eval - dialysis. Pt missed last 2 dialysis treatments, last on Monday. EMS reports pt to be hyperkalemic, BG 29. Rechecked at triage BG 95. A&Ox4. VSS    PMHx: HD on M/W/F

## 2022-05-08 ENCOUNTER — Inpatient Hospital Stay
Admit: 2022-05-08 | Discharge: 2022-05-18 | Disposition: A | Discharge status: Home / Self Care | Payer: MEDICAID | Admitting: Hospitalist

## 2022-05-08 ENCOUNTER — Observation Stay: Admit: 2022-05-08 | Payer: PRIVATE HEALTH INSURANCE

## 2022-05-08 LAB — CBC WITH AUTO DIFFERENTIAL
Absolute Immature Granulocyte: 0.1 10*3/uL — ABNORMAL HIGH (ref 0.00–0.04)
Basophils %: 1 % (ref 0–1)
Basophils Absolute: 0.1 10*3/uL (ref 0.0–0.1)
Eosinophils %: 1 % (ref 0–7)
Eosinophils Absolute: 0.1 10*3/uL (ref 0.0–0.4)
Hematocrit: 23.9 % — ABNORMAL LOW (ref 35.0–47.0)
Hemoglobin: 7.7 g/dL — ABNORMAL LOW (ref 11.5–16.0)
Immature Granulocytes: 1 % — ABNORMAL HIGH (ref 0.0–0.5)
Lymphocytes %: 10 % — ABNORMAL LOW (ref 12–49)
Lymphocytes Absolute: 0.6 10*3/uL — ABNORMAL LOW (ref 0.8–3.5)
MCH: 30.7 PG (ref 26.0–34.0)
MCHC: 32.2 g/dL (ref 30.0–36.5)
MCV: 95.2 FL (ref 80.0–99.0)
MPV: 10.3 FL (ref 8.9–12.9)
Monocytes %: 7 % (ref 5–13)
Monocytes Absolute: 0.4 10*3/uL (ref 0.0–1.0)
Neutrophils %: 80 % — ABNORMAL HIGH (ref 32–75)
Neutrophils Absolute: 4.9 10*3/uL (ref 1.8–8.0)
Nucleated RBCs: 0 PER 100 WBC
Platelets: 302 10*3/uL (ref 150–400)
RBC: 2.51 M/uL — ABNORMAL LOW (ref 3.80–5.20)
RDW: 16 % — ABNORMAL HIGH (ref 11.5–14.5)
WBC: 6.2 10*3/uL (ref 3.6–11.0)
nRBC: 0 10*3/uL (ref 0.00–0.01)

## 2022-05-08 LAB — COMPREHENSIVE METABOLIC PANEL W/ REFLEX TO MG FOR LOW K
ALT: <6 U/L — ABNORMAL LOW (ref 12–78)
AST: 26 U/L (ref 15–37)
Albumin/Globulin Ratio: 0.7 — ABNORMAL LOW (ref 1.1–2.2)
Albumin: 3.1 g/dL — ABNORMAL LOW (ref 3.5–5.0)
Alk Phosphatase: 102 U/L (ref 45–117)
Anion Gap: 6 mmol/L (ref 5–15)
BUN: 42 MG/DL — ABNORMAL HIGH (ref 6–20)
Bun/Cre Ratio: 6 — ABNORMAL LOW (ref 12–20)
CO2: 27 mmol/L (ref 21–32)
Calcium: 9.4 MG/DL (ref 8.5–10.1)
Chloride: 102 mmol/L (ref 97–108)
Creatinine: 7.23 MG/DL — ABNORMAL HIGH (ref 0.55–1.02)
Est, Glom Filt Rate: 7 mL/min/{1.73_m2} — ABNORMAL LOW (ref >60)
Globulin: 4.2 g/dL — ABNORMAL HIGH (ref 2.0–4.0)
Glucose: 124 mg/dL — ABNORMAL HIGH (ref 65–100)
Potassium: 5.7 mmol/L — ABNORMAL HIGH (ref 3.5–5.1)
Sodium: 135 mmol/L — ABNORMAL LOW (ref 136–145)
Total Bilirubin: 0.4 MG/DL (ref 0.2–1.0)
Total Protein: 7.3 g/dL (ref 6.4–8.2)

## 2022-05-08 LAB — EXTRA TUBES HOLD: Specimen HOld: 1

## 2022-05-08 LAB — RENAL FUNCTION PANEL
Albumin: 3 g/dL — ABNORMAL LOW (ref 3.5–5.0)
Anion Gap: 7 mmol/L (ref 5–15)
BUN: 42 MG/DL — ABNORMAL HIGH (ref 6–20)
Bun/Cre Ratio: 6 — ABNORMAL LOW (ref 12–20)
CO2: 28 mmol/L (ref 21–32)
Calcium: 9.2 MG/DL (ref 8.5–10.1)
Chloride: 98 mmol/L (ref 97–108)
Creatinine: 7.11 MG/DL — ABNORMAL HIGH (ref 0.55–1.02)
Est, Glom Filt Rate: 7 mL/min/{1.73_m2} — ABNORMAL LOW (ref >60)
Glucose: 176 mg/dL — ABNORMAL HIGH (ref 65–100)
Phosphorus: 3.2 MG/DL (ref 2.6–4.7)
Sodium: 133 mmol/L — ABNORMAL LOW (ref 136–145)

## 2022-05-08 LAB — PROTIME-INR
INR: 1.1 (ref 0.9–1.1)
Protime: 11.8 s — ABNORMAL HIGH (ref 9.0–11.1)

## 2022-05-08 LAB — POCT GLUCOSE
POC Glucose: 96 mg/dL (ref 65–117)
POC Glucose: 171 mg/dL — ABNORMAL HIGH (ref 65–117)
POC Glucose: 153 mg/dL — ABNORMAL HIGH (ref 65–117)
POC Glucose: 123 mg/dL — ABNORMAL HIGH (ref 65–117)
POC Glucose: 93 mg/dL (ref 65–117)
POC Glucose: 116 mg/dL (ref 65–117)

## 2022-05-08 LAB — APTT: APTT: 28.8 s (ref 22.1–31.0)

## 2022-05-08 LAB — HEPATITIS B SURFACE ANTIGEN
Hep B S Ag Interp: NEGATIVE
Hepatitis B Surface Ag: <0.1 Index

## 2022-05-08 LAB — TYPE AND SCREEN
ABO/Rh: O POS
Antibody Screen: NEGATIVE

## 2022-05-08 LAB — FOLATE: Folate: 16.7 ng/mL (ref 5.0–21.0)

## 2022-05-08 LAB — HEMOGLOBIN AND HEMATOCRIT
Hematocrit: 25.9 % — ABNORMAL LOW (ref 35.0–47.0)
Hemoglobin: 8 g/dL — ABNORMAL LOW (ref 11.5–16.0)

## 2022-05-08 LAB — HCG, SERUM, QUALITATIVE: hCG Qual: NEGATIVE

## 2022-05-08 LAB — HEPATITIS B SURFACE ANTIBODY
Hep B S Ab Interp: REACTIVE — AB
Hep B S Ab: 277.97 m[IU]/mL

## 2022-05-08 LAB — MAGNESIUM

## 2022-05-08 LAB — VITAMIN B12: Vitamin B-12: 501 pg/mL (ref 193–986)

## 2022-05-08 LAB — TROPONIN: Troponin, High Sensitivity: 19 ng/L (ref 0–51)

## 2022-05-08 LAB — IRON AND TIBC
Iron % Saturation: 13 % — ABNORMAL LOW (ref 20–50)
Iron: 32 ug/dL — ABNORMAL LOW (ref 35–150)
TIBC: 254 ug/dL (ref 250–450)

## 2022-05-08 LAB — PHOSPHORUS: Phosphorus: 3.2 MG/DL (ref 2.6–4.7)

## 2022-05-08 MED ORDER — NORMAL SALINE FLUSH 0.9 % IV SOLN
0.9 % | Freq: Two times a day (BID) | INTRAVENOUS | Status: AC
Start: 2022-05-08 — End: 2022-05-18
  Administered 2022-05-08 – 2022-05-11 (×8): 10 mL via INTRAVENOUS
  Administered 2022-05-12: 14:00:00 5 mL via INTRAVENOUS
  Administered 2022-05-12 – 2022-05-18 (×11): 10 mL via INTRAVENOUS

## 2022-05-08 MED ORDER — POLYETHYLENE GLYCOL 3350 17 G PO PACK
17 g | Freq: Every day | ORAL | Status: AC | PRN
Start: 2022-05-08 — End: 2022-05-18

## 2022-05-08 MED ORDER — PROMETHAZINE HCL 25 MG PO TABS
25 MG | Freq: Four times a day (QID) | ORAL | Status: DC | PRN
Start: 2022-05-08 — End: 2022-05-18
  Administered 2022-05-08 – 2022-05-18 (×26): 25 mg via ORAL

## 2022-05-08 MED ORDER — EPOETIN ALFA-EPBX 10000 UNIT/ML IJ SOLN
10000 | INTRAMUSCULAR | Status: DC
Start: 2022-05-08 — End: 2022-05-08

## 2022-05-08 MED ORDER — CALCIUM ACETATE (PHOS BINDER) 667 MG PO TABS
667 MG | Freq: Three times a day (TID) | ORAL | Status: AC
Start: 2022-05-08 — End: 2022-05-18
  Administered 2022-05-09 – 2022-05-18 (×25): 667 mg via ORAL

## 2022-05-08 MED ORDER — ONDANSETRON 4 MG PO TBDP
4 MG | Freq: Three times a day (TID) | ORAL | Status: AC | PRN
Start: 2022-05-08 — End: 2022-05-18

## 2022-05-08 MED ORDER — ONDANSETRON HCL 4 MG/2ML IJ SOLN
42 MG/2ML | Freq: Four times a day (QID) | INTRAMUSCULAR | Status: AC | PRN
Start: 2022-05-08 — End: 2022-05-18
  Administered 2022-05-08 – 2022-05-16 (×22): 4 mg via INTRAVENOUS

## 2022-05-08 MED ORDER — AMLODIPINE BESYLATE 5 MG PO TABS
5 MG | Freq: Every day | ORAL | Status: AC
Start: 2022-05-08 — End: 2022-05-18
  Administered 2022-05-09 – 2022-05-14 (×5): 10 mg via ORAL

## 2022-05-08 MED ORDER — HYDROMORPHONE HCL PF 1 MG/ML IJ SOLN
1 MG/ML | INTRAMUSCULAR | Status: DC | PRN
Start: 2022-05-08 — End: 2022-05-13
  Administered 2022-05-08 – 2022-05-13 (×30): 1 mg via INTRAVENOUS

## 2022-05-08 MED ORDER — NORMAL SALINE FLUSH 0.9 % IV SOLN
0.9 % | INTRAVENOUS | Status: AC | PRN
Start: 2022-05-08 — End: 2022-05-18

## 2022-05-08 MED ORDER — SODIUM CHLORIDE 0.9 % IV SOLN
0.9 % | INTRAVENOUS | Status: AC | PRN
Start: 2022-05-08 — End: 2022-05-18

## 2022-05-08 MED ORDER — EPOETIN ALFA-EPBX 10000 UNIT/ML IJ SOLN
10000 UNIT/ML | INTRAMUSCULAR | Status: AC | PRN
Start: 2022-05-08 — End: 2022-05-08

## 2022-05-08 MED ORDER — PROCHLORPERAZINE EDISYLATE 10 MG/2ML IJ SOLN
10 | Freq: Four times a day (QID) | INTRAMUSCULAR | Status: DC | PRN
Start: 2022-05-08 — End: 2022-05-09

## 2022-05-08 MED ORDER — MORPHINE SULFATE (PF) 4 MG/ML IV SOLN
4 MG/ML | INTRAVENOUS | Status: AC | PRN
Start: 2022-05-08 — End: 2022-05-08
  Administered 2022-05-08 (×3): 4 mg via INTRAVENOUS

## 2022-05-08 MED ORDER — EPOETIN ALFA-EPBX 10000 UNIT/ML IJ SOLN
10000 UNIT/ML | INTRAMUSCULAR | Status: DC
Start: 2022-05-08 — End: 2022-05-11
  Administered 2022-05-10: 03:00:00 15000 [IU] via SUBCUTANEOUS

## 2022-05-08 MED FILL — ONDANSETRON HCL 4 MG/2ML IJ SOLN: 4 MG/2ML | INTRAMUSCULAR | Qty: 2

## 2022-05-08 MED FILL — RETACRIT 10000 UNIT/ML IJ SOLN: 10000 UNIT/ML | INTRAMUSCULAR | Qty: 2

## 2022-05-08 MED FILL — CALCIUM ACETATE (PHOS BINDER) 667 MG PO TABS: 667 MG | ORAL | Qty: 1

## 2022-05-08 MED FILL — HYDROMORPHONE HCL 1 MG/ML IJ SOLN: 1 MG/ML | INTRAMUSCULAR | Qty: 1

## 2022-05-08 MED FILL — MONOJECT FLUSH SYRINGE 0.9 % IV SOLN: 0.9 % | INTRAVENOUS | Qty: 40

## 2022-05-08 MED FILL — MORPHINE SULFATE 4 MG/ML IV SOLN: 4 mg/mL | INTRAVENOUS | Qty: 1

## 2022-05-08 MED FILL — PROMETHAZINE HCL 25 MG PO TABS: 25 MG | ORAL | Qty: 1

## 2022-05-08 MED FILL — AMLODIPINE BESYLATE 5 MG PO TABS: 5 MG | ORAL | Qty: 2

## 2022-05-08 NOTE — Other (Signed)
Post Treatment     05/08/22 1151   Vital Signs   BP 124/86   Temp 98.5 F (36.9 C)   Pulse 90   Respirations 18   SpO2 99 %   Pain Assessment   Pain Assessment None - Denies Pain   Pain Level 0   Post-Hemodialysis Assessment   Post-Treatment Procedures Blood returned;Access bleeding time < 10 minutes   Herbalist   Rinseback Volume (ml) 200 ml   Blood Volume Processed (Liters) 78.1 L   Dialyzer Clearance Lightly streaked   Duration of Treatment (minutes) 210 minutes   Heparin Amount Administered During Treatment (mL) 0 mL   Hemodialysis Intake (ml) 500 ml   Hemodialysis Output (ml) 2500 ml   NET Removed (ml) 2000   Tolerated Treatment Good   Bilateral Breath Sounds Clear   Edema None   Physician Notified No   Time Off 1140   Patient Disposition Remain in ICU/ED  (bed 5)   Observations & Evaluations   Level of Consciousness 0   Oriented X 4   Heart Rhythm Regular   Respiratory Quality/Effort Unlabored   O2 Device None (Room air)   Skin Condition/Temp Dry;Warm   Appetite Fair   Abdomen Inspection Obese   Bowel Sounds (All Quadrants) Active;Audible     Primary RN SBAR: Philip Aspen,, RN   Comments: Pt tolerated treatment well. All possible blood returned. Hemostasis achieved. +bruit and +thrill post HD. Report given to primary nurse. Pt left resting in bed with call light in reach.

## 2022-05-08 NOTE — Progress Notes (Signed)
I received text message from nurse who received call from radiologist reading CT abdomen/ pelvis without IV contrast with the following results:    IMPRESSION:  Hypodense and enlarged left kidney with associated perinephric stranding and  scattered smaller hyperdense foci within the renal parenchyma, spontaneous renal  hemorrhage is a differential consideration.     Numerous right renal cyst.    -I have ordered stat repeat labs including CBC, PT/INR/PTT, type and screen, and CMP  -I have placed call out to on-call urologist via answering service for stat consult  -Keep patient NPO

## 2022-05-08 NOTE — Other (Signed)
Pre Treatment    05/08/22 0800   Vital Signs   BP 120/77   Pulse 80   Respirations 17   SpO2 100 %   Pain Assessment   Pain Assessment 0-10   Pain Level 0   Treatment   Time On 0807   Time Off 1137   Treatment Goal 3.5h, 2L   Observations & Evaluations   Level of Consciousness 0   Oriented X 4   Heart Rhythm Regular   Respiratory Quality/Effort Unlabored   O2 Device None (Room air)   Bilateral Breath Sounds Clear   Skin Color Other (comment)  (appropiate for ethncity)   Skin Condition/Temp Dry;Warm   Appetite Fair   Abdomen Inspection Obese   Bowel Sounds (All Quadrants) Active;Audible   Edema None   Technical Checks   Dialysis Machine No. [id number]   RO Machine Number [id number]   Dialyzer Lot No. [id number]   [place] Lot Number [id number]   All Connections Secure Yes   NS Bag Yes   Saline Line Double Clamped Yes   Dialyzer Revaclear 300   Prime Volume (mL) 200 mL   ICEBOAT I;C;E;B;O;A;T   RO Machine Log Sheet Completed Yes   Machine Alarm Self Test Completed;Passed   Materials engineer Function;pH Reading   Animal nutritionist Conductivity 13.6   Manual Conductivity 13.4   Manual Ph 7.4   Bleach Test (Neg) No   Bath Temperature 98.6 F (37 C)   Dialysis Bath   K+ (Potassium) 2   Ca+ (Calcium) 2.5   Na+ (Sodium) 138   HCO3 (Bicarb) 38   Bicarbonate Concentrate Lot No. [id number]   Acid Concentrate Lot No. [id number]   Treatment Initiation   Dialyze Hours 3.5   Treatment  Initiation Universal Precautions maintained;Lines secured to patient;Connections secured;Prime given;Arterial Parameters set;Venous Parameters set;Chiropodist engaged;Revaclear Dialyzer;REV-300   Access   Add Access? Yes   Hemodialysis Fistula/Graft Arteriovenous fistula Right Arm   Placement Date/Time: 05/07/22 0820   Present on Admission/Arrival: Yes  Access Type: Arteriovenous fistula  Orientation: Right  Access Location: Arm   Site Assessment Dry;Intact   Thrill Present   Bruit Present   Status  Accessed   Venous Needle Size 15 G   Arterial Needle Size 15 G   Accessed BySharlee Blew, RN   Access Attempts  1   Date of Last Dressing Change 05/08/22   Access Interventions Chlorhexidine;Aseptic Technique;Needles taped to patient   Dressing Intervention New   Dressing Status Clean, dry & intact   Primary RN SBAR: Jennye Moccasin, RN   Patient Education: Pt educated about access and treatment orders.   Hospital associated wait time; reason: 0    Hepatitis B Surface Ag   Date/Time Value Ref Range Status   05/07/2022 09:35 PM <0.10 Index Final     Hep B S Ab   Date/Time Value Ref Range Status   05/07/2022 09:35 PM 277.97 mIU/mL Final

## 2022-05-08 NOTE — ED Notes (Signed)
RETACRIT not available to pull from [facility]. Dialysis RN informed

## 2022-05-08 NOTE — Progress Notes (Signed)
Chart reviewed, CT visualized  Left renal hemorrhage    Hold anticoagulants  Follow H/H, transfuse if HD unstable    If hg continues to drop will need to ask interventional radiology to perform embolization

## 2022-05-08 NOTE — H&P (Signed)
Urology Consult    Subjective:     Date of Consultation:  May 08, 2022    Referring Physician: ER    Reason for Consultation:  renal bleed    Patient Name: Meredith Hawkins  MRN: [id number]    History of Present Illness:   Meredith Hawkins is a 37 y.o. 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  Per chart review, patient is native of [place] and is visiting family.  Per chart review she had recent visit at [facility] in [facility] where she left AMA.  She reports has multiple visits for abdominal pain in the past.  She has multiple drug allergies to multiple pain medications including acetaminophen, NSAIDs, codeine, hydrocodone, oxycodone with anaphylactic reaction.  Today she initially went to [facility] ED with aforementioned complaints, pain which remain constant, severe, without specific alleviating factors, initially located in chest and breasts, radiating to bilateral flank and extending to lower abdomen, with onset starting about 3 days ago, without specific alleviating factors, with associated nausea and vomiting.  Patient reportedly admits her to prior hemodialysis sessions with last occurring on Monday, 05/02/2022.  On arrival at [facility] ED, workup included labs which were abnormal for hemoglobin 7.5, sodium 133, potassium 6.5, BUN 40, creatinine 6.32, GFR 8, albumin 3.3, alkaline phosphatase 140.  12 EKG shows sinus rhythm, first-degree AV block at 70 bpm.  POC glucose levels were 84 and 140.  Patient was given regular insulin 8  units IV x 1, D50, 50 g IV x 1, sodium bicarb and 8.4%, 50 mEq IV x 1, albuterol nebulizer 5 mg x 1, Phenergan 25 mg p.o. x 1, and morphine 1 mg IV x 1 dose.  Repeat K = 5.0.  Repeat POC glucose = 29.  Per chart review, patient was given D50 25 g IV x 1 and started on D5 normal saline IV at 125 mL/h x 2 hours.  Request was for transfer to [facility] for hemodialysis treatment.  On arrival here, patient now complaining of continued intractable pain as described above and requesting pain medications for the same. CT visualized- left renal hematoma noted. No hx of trauma    Past Medical History:   Diagnosis Date    Asthma     Lupus ([place])     VSD (ventricular septal defect)       No past surgical history on file.   No family history on file.   Social History     Tobacco Use    Smoking status: Never    Smokeless tobacco: Never   Substance Use Topics    Alcohol use: Never     Allergies   Allergen Reactions    Ace Inhibitors Anaphylaxis     Pt states "all of these have closed my throat"    Acetaminophen Anaphylaxis     Pt states "all of these have closed my throat"    Codeine Anaphylaxis     Pt states "all of these have closed my throat"    Fentanyl Anaphylaxis    Hydrocodone Anaphylaxis     Pt states "all of these have closed my throat"    Nsaids Other (See  Comments)     pcp states not to take due to kidneys     Oxycodone Anaphylaxis     Pt states "all of these have closed my throat"    Sulfa Antibiotics Anaphylaxis      Prior to Admission medications    Medication Sig Start Date End Date Taking? Authorizing Provider   amLODIPine (NORVASC) 10 MG tablet Take 1 tablet by mouth daily   Yes [provider]   calcium acetate (PHOSLO) 667 MG CAPS capsule Take 1 capsule by mouth 3 times daily (with meals)   Yes [provider]   apixaban (ELIQUIS) 2.5 MG TABS tablet Take 1 tablet by mouth 2 times daily   Yes [provider]         Review of Systems:  Pertinent items are noted in  HPI.    Objective:     Data Review (Labs):    Recent Labs     05/07/22  1432 05/07/22  1706 05/07/22  2135 05/08/22  0201   WBC 5.7  --   --  6.2   HGB 7.5*  --   --  7.7*   MCV 94.1  --   --  95.2   PLT 279  --   --  302   NA 133*  --  133* 135*   K 6.5* 5.0 HEMOLYZED,RECOLLECT REQUESTED 5.7*   BUN 40*  --  42* 42*   ALT 6*  --   --  <6*   INR  --   --   --  1.1   IPVITALS(8:)Temp (24hrs), Avg:98.2 F (36.8 C), Min:98 F (36.7 C), Max:98.3 F (36.8 C)    Temp (24hrs), Avg:98.2 F (36.8 C), Min:98 F (36.7 C), Max:98.3 F (36.8 C)  RRIOLAST3No intake/output data recorded.    Physical Exam:            General:    alert, appears stated age, cooperative, and no distress                     Skin:  normal                       Assessment:     Principal Problem:    ESRD on hemodialysis ([place])  Resolved Problems:    * No resolved hospital problems. *        Left renal hemorrhage    Plan:     Follow serial H/H  Transfuse if necessary  Hold anticoagulants  Will need IR embolization if bleeding persists    Signed By: Donato Heinz, MD                         May 08, 2022

## 2022-05-08 NOTE — Plan of Care (Signed)
Problem: Discharge Planning  Goal: Discharge to home or other facility with appropriate resources  Outcome: Progressing     Problem: Pain  Goal: Verbalizes/displays adequate comfort level or baseline comfort level  Outcome: Progressing

## 2022-05-08 NOTE — Progress Notes (Signed)
[facility]   [address] [place] [address]  Phone: [phone]   [phone]    [email or web address]     Nephrology Progress Note    Patient Name : Meredith Hawkins     DOB : Apr 03, 1985     MRN : [id number]  Date of Admission : 05/07/2022  Date of Servive : 05/08/22    CC:  Follow up for ESRD       Assessment and Plan   ESRD on HD MWF in [place]  Hyperkalemia  L renal hemorrhage  HTN  SLE  SVC syndrome  Chronic VTE on eliquis prior to admission      Plan:  HD today and again tomorrow  [facility] on hold  Urology following  Cont norvasc and phoslo     Interval History:  37 yo tx from [place] for dialysis. She dialyzes in [place] MWF, missed her last 2 treatments. Hx of SVC syndrome on elisus, SLE, VSD.  Hyperkalemic with k 6.5.  getting HD now.  She is having abd pain.  CT scan showed L renal hemorrhage.  No cp or sob.      Review of Systems: Pertinent items are noted in HPI.    Current Medications:   Current Facility-Administered Medications   Medication Dose Route Frequency    sodium chloride flush 0.9 % injection 5-40 mL  5-40 mL IntraVENous 2 times per day    sodium chloride flush 0.9 % injection 5-40 mL  5-40 mL IntraVENous PRN    0.9 % sodium chloride infusion   IntraVENous PRN    ondansetron (ZOFRAN-ODT) disintegrating tablet 4 mg  4 mg Oral Q8H PRN    Or    ondansetron (ZOFRAN) injection 4 mg  4 mg IntraVENous Q6H PRN    polyethylene glycol (GLYCOLAX) packet 17 g  17 g Oral Daily PRN    epoetin alfa-epbx (RETACRIT) injection 15,000 Units  15,000 Units IntraVENous PRN    morphine (PF) injection 4 mg  4 mg IntraVENous Q4H PRN    amLODIPine (NORVASC) tablet 10 mg  10 mg Oral Daily    calcium acetate (PHOSLO) tablet 667 mg  667 mg Oral TID WC     Current Outpatient Medications   Medication Sig    amLODIPine (NORVASC) 10 MG tablet Take 1 tablet by mouth daily    calcium acetate (PHOSLO) 667 MG CAPS capsule Take 1 capsule by mouth 3 times daily (with meals)    apixaban  (ELIQUIS) 2.5 MG TABS tablet Take 1 tablet by mouth 2 times daily      Allergies   Allergen Reactions    Ace Inhibitors Anaphylaxis     Pt states "all of these have closed my throat"    Acetaminophen Anaphylaxis     Pt states "all of these have closed my throat"    Codeine Anaphylaxis     Pt states "all of these have closed my throat"    Fentanyl Anaphylaxis    Hydrocodone Anaphylaxis     Pt states "all of these have closed my throat"    Nsaids Other (See Comments)     pcp states not to take due to kidneys     Oxycodone Anaphylaxis     Pt states "all of these have closed my throat"    Sulfa Antibiotics Anaphylaxis       Objective:  Vitals:    Vitals:    05/08/22 0830 05/08/22 0845 05/08/22 0901  05/08/22 0915   BP: 113/71 115/74 111/79 123/83   Pulse: 90 85 80 82   Resp:       Temp:       TempSrc:       SpO2:       Height:         Intake and Output:  No intake/output data recorded.  No intake/output data recorded.    Physical Examination:  General: NAD,Conversant   Neck:  Supple, no mass  Resp:  Lungs CTA B/L, no wheezing , normal respiratory effort  CV:  RRR,  no murmur or rub, LE edema  GI:  Soft, NT, + BS, no HS megaly  Neurologic:  Non focal  Psych:             AAO x 3 appropriate affect   Skin:  No Rash  Dialysis Access : RUE AVF    []$     High complexity decision making was performed  []$     Patient is at high-risk of decompensation with multiple organ involvement    Lab Data Personally Reviewed: I have reviewed all the pertinent labs, microbiology data and radiology studies during assessment.    Labs:  Recent Labs     05/07/22  1432 05/07/22  1706 05/07/22  2135 05/08/22  0201   NA 133*  --  133* 135*   K 6.5* 5.0 HEMOLYZED,RECOLLECT REQUESTED 5.7*   CL 97  --  98 102   CO2 28  --  28 27   GLUCOSE 81  --  176* 124*   BUN 40*  --  42* 42*   CREATININE 6.32*  --  7.11* 7.23*   CALCIUM 9.0  --  9.2 9.4       Recent Labs     05/07/22  1432 05/08/22  0201   WBC 5.7 6.2   RBC 2.54* 2.51*   HGB 7.5* 7.7*   HCT 23.9*  23.9*   MCV 94.1 95.2   MCH 29.5 30.7   MCHC 31.4 32.2   RDW 16.0* 16.0*   PLT 279 302   MPV 9.4 10.3     Recent Labs     05/07/22  1432 05/08/22  0201   GLOB 4.2* 4.2*     Recent Labs     05/08/22  0201   INR 1.1   APTT 28.8      No results for input(s): "CPK", "CKMB", "TROPONINI" in the last 72 hours.    Invalid input(s): "B-NP"  Invalid input(s): "PHI", "PCO2I", "PO2I", "FIO2I"     Ventilator:       Microbiology:  No results found for: "SDES"  No components found for: "CULT"      I have reviewed the flowsheets.  Chart and Pertinent Notes have been reviewed.   No change in PMH ,family and social history from Consult note.      Meredith Peaches, MD  [facility]

## 2022-05-08 NOTE — Progress Notes (Signed)
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       Interval history / Subjective:   States symptoms unchanged, requesting dilaudid instead of morphine and phenergan instead of zofran. Seen during HD.     Assessment & Plan:     L renal hemorrhage  ESRD on HD, mild hyperK+  Anemia  History of SVC syndrome due ot thrombus  HTN  Asthma  ?SLE    -monitor H/H - repeat ordered  -urology recs appreciated - if worsens needs IR embo, hold anticoagulants   -pain control  -BP control  -HD per nephrology        Code status: full  Prophylaxis: SCDs  Care Plan discussed with: pt  Anticipated Disposition:   Inpatient  Cardiac monitoring: Telemetry  Central Line:            Social Determinants of Health     Tobacco Use: Low Risk  (05/07/2022)    Patient History     Smoking Tobacco Use: Never     Smokeless Tobacco Use: Never     Passive Exposure: Not on file   Alcohol Use: Not At Risk (05/07/2022)    AUDIT-C     Frequency of Alcohol Consumption: Never     Average Number of Drinks: Patient does not drink     Frequency of Binge Drinking: Never   Emergency planning/management officer Strain: Not on file   Food Insecurity: Not on file   Transportation Needs: Not on file   Physical Activity: Not on file   Stress: Not on file   Social Connections: Not on file   Intimate Partner Violence: Not on file   Depression: Not on file   Housing Stability: Not on file   Interpersonal Safety: Not on file   Utilities: Not on file       Review of Systems:   A comprehensive review of systems was negative.       Vital Signs:    Last 24hrs  VS reviewed since prior progress note. Most recent are:  Vitals:    05/08/22 1200   BP: (!) 124/90   Pulse: 89   Resp: 18   Temp: 98.8 F (37.1 C)   SpO2: 99%         Intake/Output Summary (Last 24 hours) at 05/08/2022 1324  Last data filed at 05/08/2022 1322  Gross per 24 hour   Intake 510 ml   Output 2500 ml   Net -1990 ml        Physical Examination:  I had a face to face encounter with this patient and independently examined them on 05/08/2022 as outlined below:          NAD undergoing HD  RR on tele  Normal work of breathing  Neuro grossly non-focal    Data Review:    Review and/or order of clinical lab test  Review and/or order of tests in the radiology section of CPT  Review and/or order of tests in the medicine section of CPT      I have personally and independently reviewed all pertinent labs, diagnostic studies, imaging, and have provided independent interpretation of the same.     Labs:     Recent Labs     05/07/22  1432 05/08/22  0201   WBC 5.7 6.2   HGB 7.5* 7.7*   HCT 23.9* 23.9*   PLT 279 302     Recent Labs     05/07/22  1432 05/07/22  1706 05/07/22  2135 05/08/22  0201   NA 133*  --  133* 135*   K 6.5* 5.0 HEMOLYZED,RECOLLECT REQUESTED 5.7*   CL 97  --  98 102   CO2 28  --  28 27   BUN 40*  --  42* 42*   MG  --   --  HEMOLYZED,RECOLLECT REQUESTED  --    PHOS  --   --  3.2 3.2     Recent Labs     05/07/22  1432 05/08/22  0201   ALT 6* <6*   GLOB 4.2* 4.2*     Recent Labs     05/08/22  0201   INR 1.1   APTT 28.8      Recent Labs     05/07/22  2135   TIBC 254      No results found for: "FOL", "RBCF"   No results for input(s): "PH", "PCO2", "PO2" in the last 72 hours.  No results for input(s): "CPK" in the last 72 hours.    Invalid input(s): "CPKMB", "CKNDX", "TROIQ"  No results found for: "CHOL", "[place]", "CHLST", "CHOLV", "HDL", "HDLC", "LDL", "LDLC", "TGLX", "TRIGL"  No results found for: "GLUCPOC"  @LABUA$ @    Notes reviewed from all clinical/nonclinical/nursing services involved in patient's  clinical care. Care coordination discussions were held with appropriate clinical/nonclinical/ nursing providers based on care coordination needs.         Patients current active Medications were reviewed, considered, added and adjusted based on the clinical condition today.      Home Medications were reconciled to the best of my ability given all available resources at the time of admission. Route is PO if not otherwise noted.      Admission Status:[id number]:::1}      Medications Reviewed:     Current Facility-Administered Medications   Medication Dose Route Frequency    HYDROmorphone HCl PF (DILAUDID) injection 1 mg  1 mg IntraVENous Q4H PRN    [START ON 05/09/2022] epoetin alfa-epbx (RETACRIT) injection 15,000 Units  15,000 Units SubCUTAneous Once per day on Mon Wed Fri    promethazine (PHENERGAN) tablet 25 mg  25 mg Oral Q6H PRN    sodium chloride flush 0.9 % injection 5-40 mL  5-40 mL IntraVENous 2 times per day    sodium chloride flush 0.9 % injection 5-40 mL  5-40 mL IntraVENous PRN    0.9 % sodium chloride infusion   IntraVENous PRN    ondansetron (ZOFRAN-ODT) disintegrating tablet 4 mg  4 mg Oral Q8H PRN  Or    ondansetron (ZOFRAN) injection 4 mg  4 mg IntraVENous Q6H PRN    polyethylene glycol (GLYCOLAX) packet 17 g  17 g Oral Daily PRN    amLODIPine (NORVASC) tablet 10 mg  10 mg Oral Daily    calcium acetate (PHOSLO) tablet 667 mg  667 mg Oral TID WC     Current Outpatient Medications   Medication Sig    amLODIPine (NORVASC) 10 MG tablet Take 1 tablet by mouth daily    calcium acetate (PHOSLO) 667 MG CAPS capsule Take 1 capsule by mouth 3 times daily (with meals)    apixaban (ELIQUIS) 2.5 MG TABS tablet Take 1 tablet by mouth 2 times daily     ______________________________________________________________________  EXPECTED LENGTH OF STAY: 2  ACTUAL LENGTH OF STAY:          0                 Timothy Lasso, MD

## 2022-05-09 DIAGNOSIS — N2889 Other specified disorders of kidney and ureter: Secondary | ICD-10-CM

## 2022-05-09 LAB — EXTRA TUBES HOLD

## 2022-05-09 MED ORDER — PROCHLORPERAZINE EDISYLATE 10 MG/2ML IJ SOLN
102 MG/2ML | Freq: Four times a day (QID) | INTRAMUSCULAR | Status: DC | PRN
Start: 2022-05-09 — End: 2022-05-18
  Administered 2022-05-13: 02:00:00 10 mg via INTRAVENOUS

## 2022-05-09 MED ORDER — SODIUM ZIRCONIUM CYCLOSILICATE 5 G PO PACK
5 g | Freq: Every day | ORAL | Status: DC
Start: 2022-05-09 — End: 2022-05-11
  Administered 2022-05-09 – 2022-05-11 (×3): 10 g via ORAL

## 2022-05-09 MED FILL — ONDANSETRON HCL 4 MG/2ML IJ SOLN: 4 MG/2ML | INTRAMUSCULAR | Qty: 2

## 2022-05-09 MED FILL — HYDROMORPHONE HCL 1 MG/ML IJ SOLN: 1 MG/ML | INTRAMUSCULAR | Qty: 1

## 2022-05-09 MED FILL — AMLODIPINE BESYLATE 5 MG PO TABS: 5 MG | ORAL | Qty: 2

## 2022-05-09 MED FILL — PROMETHAZINE HCL 25 MG PO TABS: 25 MG | ORAL | Qty: 1

## 2022-05-09 MED FILL — CALCIUM ACETATE (PHOS BINDER) 667 MG PO TABS: 667 MG | ORAL | Qty: 1

## 2022-05-09 MED FILL — LOKELMA 5 G PO PACK: 5 g | ORAL | Qty: 2

## 2022-05-09 MED FILL — RETACRIT 10000 UNIT/ML IJ SOLN: 10000 UNIT/ML | INTRAMUSCULAR | Qty: 1.5

## 2022-05-09 MED FILL — RETACRIT 10000 UNIT/ML IJ SOLN: 10000 UNIT/ML | INTRAMUSCULAR | Qty: 2

## 2022-05-09 NOTE — Other (Signed)
Patient refused CHG bath and labs at this time, she wanted to sleep, will attempt again to complete task.

## 2022-05-09 NOTE — Progress Notes (Signed)
[facility]   [address] [place] [address]  Phone: [phone]   [phone]    [email or web address]     Nephrology Progress Note    Patient Name : Meredith Hawkins     DOB : 03/01/1986     MRN : [id number]  Date of Admission : 05/07/2022  Date of Servive : 05/09/22    CC:  Follow up for ESRD       Assessment and Plan   ESRD on HD MWF in [place]  - access RAVF + bruit/ thrill  Hyperkalemia  L renal hemorrhage  HTN  SLE  SVC syndrome  Chronic VTE on eliquis prior to admission      Plan:  HD today on low K bath- UF 2L  Continue with low K diet  Lokelma 10 GM this AM  Continue with ESA/ Phos binders     Interval History:  Seen and examined this AM. Denies CP or SOB. She continues with mild flank pain.    Review of Systems: Pertinent items are noted in HPI.    Current Medications:   Current Facility-Administered Medications   Medication Dose Route Frequency    sodium zirconium cyclosilicate (LOKELMA) oral suspension 10 g  10 g Oral Daily    HYDROmorphone HCl PF (DILAUDID) injection 1 mg  1 mg IntraVENous Q4H PRN    epoetin alfa-epbx (RETACRIT) injection 15,000 Units  15,000 Units SubCUTAneous Once per day on Mon Wed Fri    promethazine (PHENERGAN) tablet 25 mg  25 mg Oral Q6H PRN    prochlorperazine (COMPAZINE) injection 25 mg  25 mg IntraVENous Q6H PRN    sodium chloride flush 0.9 % injection 5-40 mL  5-40 mL IntraVENous 2 times per day    sodium chloride flush 0.9 % injection 5-40 mL  5-40 mL IntraVENous PRN    0.9 % sodium chloride infusion   IntraVENous PRN    ondansetron (ZOFRAN-ODT) disintegrating tablet 4 mg  4 mg Oral Q8H PRN    Or    ondansetron (ZOFRAN) injection 4 mg  4 mg IntraVENous Q6H PRN    polyethylene glycol (GLYCOLAX) packet 17 g  17 g Oral Daily PRN    amLODIPine (NORVASC) tablet 10 mg  10 mg Oral Daily    calcium acetate (PHOSLO) tablet 667 mg  667 mg Oral TID WC      Allergies   Allergen Reactions    Ace Inhibitors Anaphylaxis     Pt  states "all of these have closed my throat"    Acetaminophen Anaphylaxis     Pt states "all of these have closed my throat"    Codeine Anaphylaxis     Pt states "all of these have closed my throat"    Fentanyl Anaphylaxis    Hydrocodone Anaphylaxis     Pt states "all of these have closed my throat"    Nsaids Other (See Comments)     pcp states not to take due to kidneys     Oxycodone Anaphylaxis     Pt states "all of these have closed my throat"    Sulfa Antibiotics Anaphylaxis       Objective:  Vitals:    Vitals:    05/09/22 0351 05/09/22 0409 05/09/22 0555 05/09/22 0731   BP:  122/71  (!) 140/87   Pulse: 76 66 72 68   Resp:  13  13   Temp:  98.4 F (36.9 C)  98.4 F (36.9 C)   TempSrc:  Oral  Oral   SpO2:       Height:         Intake and Output:  No intake/output data recorded.  02/17 1901 - 02/19 0700  In: 510 [I.V.:10]  Out: 2500     Physical Examination:  General: NAD,Conversant   Neck:  Supple, no mass  Resp:  Lungs CTA B/L, no wheezing , normal respiratory effort  CV:  RRR, + murmur , trace B/L LE edema  GI:  Soft, NT, + BS, no HS megaly  Neurologic:  Non focal  Psych:             AAO x 3 appropriate affect   Skin:  No Rash  Dialysis Access : RUE AVF + bruit    []$     High complexity decision making was performed  []$     Patient is at high-risk of decompensation with multiple organ involvement    Lab Data Personally Reviewed: I have reviewed all the pertinent labs, microbiology data and radiology studies during assessment.    Labs:  Recent Labs     05/07/22  1432 05/07/22  1706 05/07/22  2135 05/08/22  0201   NA 133*  --  133* 135*   K 6.5* 5.0 HEMOLYZED,RECOLLECT REQUESTED 5.7*   CL 97  --  98 102   CO2 28  --  28 27   GLUCOSE 81  --  176* 124*   BUN 40*  --  42* 42*   CREATININE 6.32*  --  7.11* 7.23*   CALCIUM 9.0  --  9.2 9.4         Recent Labs     05/07/22  1432 05/08/22  0201 05/08/22  1601   WBC 5.7 6.2  --    RBC 2.54* 2.51*  --    HGB 7.5* 7.7* 8.0*   HCT 23.9* 23.9* 25.9*   MCV 94.1 95.2  --    MCH  29.5 30.7  --    MCHC 31.4 32.2  --    RDW 16.0* 16.0*  --    PLT 279 302  --    MPV 9.4 10.3  --        Recent Labs     05/07/22  1432 05/08/22  0201   GLOB 4.2* 4.2*       Recent Labs     05/08/22  0201   INR 1.1   APTT 28.8        No results for input(s): "CPK", "CKMB", "TROPONINI" in the last 72 hours.    Invalid input(s): "B-NP"  Invalid input(s): "PHI", "PCO2I", "PO2I", "FIO2I"     Ventilator:       Microbiology:  No results found for: "SDES"  No components found for: "CULT"      I have reviewed the flowsheets.  Chart and Pertinent Notes have been reviewed.   No change in PMH ,family and social history from Consult note.      Maurine Cane, APRN - NP  [facility]

## 2022-05-09 NOTE — Progress Notes (Signed)
Patient: Meredith Hawkins MRN: [id number]  SSN: [id number]    Date of Birth: 05-27-1985  Age: 37 y.o.  Sex: female        ADMITTED: 05/07/2022 to Wandra Feinstein, MD by Marigene Ehlers, MD for Hyperkalemia [E87.5]  ESRD (end stage renal disease) ([place]) [N18.6]  ESRD on hemodialysis ([place]) [N18.6, Z99.2]    Meredith Hawkins is doing fair. Complains of bilateral abdominal pain (L>R) and left flank pain, requiring IV dilaudid. The pain came on suddenly a few days ago. Denies recent fall or injury to back prior to admission. She denies hematuria. Last dose of Eliquis was 2 days ago 05/07/22.     Hgb 7.5 on arrival 2/17, 8.0 today.     Vitals: Temp (24hrs), Avg:98.5 F (36.9 C), Min:97.9 F (36.6 C), Max:98.9 F (37.2 C)    Blood pressure (!) 140/87, pulse 93, temperature 98.4 F (36.9 C), temperature source Oral, resp. rate 13, height 1.676 m (5' 6"$ ), last menstrual period 04/25/2022, SpO2 98 %.    Intake and Output:  02/17 1901 - 02/19 0700  In: 510 [I.V.:10]  Out: 2500   No intake/output data recorded.  JP Output lats 24 hrs: No data found.   JP Output last 8 hrs: No data found.  BM over last 24 hrs: No data found.    Physical Exam  General: NAD, pleasant  Respiratory: no distress, breathing easily, room air  Abdomen: patient refuses abdominal exam 2/2 pain, no visible abnormality   GU: voiding independently, no UA to assess   Neuro: Appropriate, no focal neurological deficits  Skin: warm, dry  Extremities: moves all, full ROM    Labs:  CBC:   Lab Results   Component Value Date/Time    WBC 6.2 05/08/2022 02:01 AM    HCT 25.9 05/08/2022 04:01 PM    PLT 302 05/08/2022 02:01 AM     BMP:   Lab Results   Component Value Date/Time    NA 135 05/08/2022 02:01 AM    K 5.7 05/08/2022 02:01 AM    CL 102 05/08/2022 02:01 AM    CO2 27 05/08/2022 02:01 AM    BUN 42 05/08/2022 02:01 AM      Assessment/Plan:   37 yo F with hx of ESRD on HD with Left renal hemorrhage - Hgb stable.     Follow serial H/H, Transfuse if  hemodynamically unstable  Continue to hold anticoagulants  Consideration of IR embolization if hgb drops   Following     Supervising MD, Dr. Percell Miller   Signed By: Legrand Rams, APRN - NP - May 09, 2022

## 2022-05-09 NOTE — Progress Notes (Signed)
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       Interval history / Subjective:   Persistent pain though improved in severity, no n/v, has been NPO.     Assessment & Plan:     L renal hemorrhage  ESRD on HD, mild hyperK+  Anemia  History of SVC syndrome due ot thrombus  HTN  Asthma  ?SLE    -monitor H/H - stable so far  -urology recs appreciated - if worsens needs IR embo, hold anticoagulants   -pain control  -BP control  -HD per nephrology   -diet       Code status: full  Prophylaxis: SCDs  Care Plan discussed with: pt rn cm  Anticipated Disposition:   Inpatient  Cardiac monitoring: Telemetry  Central Line:            Social Determinants of Health     Tobacco Use: Low Risk  (05/07/2022)    Patient History     Smoking Tobacco Use: Never     Smokeless Tobacco Use: Never     Passive Exposure: Not on file   Alcohol Use: Not At Risk (05/07/2022)    AUDIT-C     Frequency of Alcohol Consumption: Never     Average Number of Drinks: Patient does not drink     Frequency of Binge Drinking: Never   Emergency planning/management officer Strain: Not on file   Food Insecurity: Not on file   Transportation Needs: Not on file   Physical Activity: Not on file   Stress: Not on file   Social Connections: Not on file   Intimate Partner Violence: Not on file   Depression: Not on file   Housing Stability: Not on file   Interpersonal Safety: Not on file   Utilities: Not on file       Review of Systems:   A comprehensive review of systems was negative.       Vital Signs:    Last 24hrs VS reviewed since prior progress  note. Most recent are:  Vitals:    05/09/22 1108   BP: 115/70   Pulse: 81   Resp: 10   Temp: 98.5 F (36.9 C)   SpO2:          Intake/Output Summary (Last 24 hours) at 05/09/2022 1139  Last data filed at 05/08/2022 1322  Gross per 24 hour   Intake 510 ml   Output 2500 ml   Net -1990 ml          Physical Examination:  I had a face to face encounter with this patient and independently examined them on 05/09/2022 as outlined below:          NAD  RRR +systolic murmur  Normal work of breathing CTA b/l  Neuro grossly non-focal    Data Review:    Review and/or order of clinical lab test  Review and/or order of tests in the radiology section of CPT  Review and/or order of tests in the medicine section of CPT      I have personally and independently reviewed all pertinent labs, diagnostic studies, imaging, and have provided independent interpretation of the same.     Labs:     Recent Labs     05/07/22  1432 05/08/22  0201 05/08/22  1601   WBC 5.7 6.2  --    HGB 7.5* 7.7* 8.0*   HCT 23.9* 23.9* 25.9*   PLT 279 302  --        Recent Labs     05/07/22  1432 05/07/22  1706 05/07/22  2135 05/08/22  0201   NA 133*  --  133* 135*   K 6.5* 5.0 HEMOLYZED,RECOLLECT REQUESTED 5.7*   CL 97  --  98 102   CO2 28  --  28 27   BUN 40*  --  42* 42*   MG  --   --  HEMOLYZED,RECOLLECT REQUESTED  --    PHOS  --   --  3.2 3.2       Recent Labs     05/07/22  1432 05/08/22  0201   ALT 6* <6*   GLOB 4.2* 4.2*       Recent Labs     05/08/22  0201   INR 1.1   APTT 28.8        Recent Labs     05/07/22  2135   TIBC 254        No results found for: "FOL", "RBCF"   No results for input(s): "PH", "PCO2", "PO2" in the last 72 hours.  No results for input(s): "CPK" in the last 72 hours.    Invalid input(s): "CPKMB", "CKNDX", "TROIQ"  No results found for: "CHOL", "[place]", "CHLST", "CHOLV", "HDL", "HDLC", "LDL", "LDLC", "TGLX", "TRIGL"  No results found for: "GLUCPOC"  @LABUA$ @    Notes reviewed from all clinical/nonclinical/nursing services involved in  patient's clinical care. Care coordination discussions were held with appropriate clinical/nonclinical/ nursing providers based on care coordination needs.         Patients current active Medications were reviewed, considered, added and adjusted based on the clinical condition today.      Home Medications were reconciled to the best of my ability given all available resources at the time of admission. Route is PO if not otherwise noted.      Admission Status:[id number]:::1}      Medications Reviewed:     Current Facility-Administered Medications   Medication Dose Route Frequency    sodium zirconium cyclosilicate (LOKELMA) oral suspension 10 g  10 g Oral Daily    prochlorperazine (COMPAZINE) injection 10 mg  10 mg IntraVENous Q6H PRN    HYDROmorphone HCl PF (DILAUDID) injection 1 mg  1 mg IntraVENous Q4H PRN    epoetin alfa-epbx (RETACRIT) injection 15,000 Units  15,000 Units SubCUTAneous Once per day on Mon Wed Fri    promethazine (PHENERGAN) tablet 25 mg  25 mg Oral Q6H PRN    sodium chloride flush 0.9 % injection 5-40 mL  5-40 mL IntraVENous  2 times per day    sodium chloride flush 0.9 % injection 5-40 mL  5-40 mL IntraVENous PRN    0.9 % sodium chloride infusion   IntraVENous PRN    ondansetron (ZOFRAN-ODT) disintegrating tablet 4 mg  4 mg Oral Q8H PRN    Or    ondansetron (ZOFRAN) injection 4 mg  4 mg IntraVENous Q6H PRN    polyethylene glycol (GLYCOLAX) packet 17 g  17 g Oral Daily PRN    amLODIPine (NORVASC) tablet 10 mg  10 mg Oral Daily    calcium acetate (PHOSLO) tablet 667 mg  667 mg Oral TID WC     ______________________________________________________________________  EXPECTED LENGTH OF STAY: 3  ACTUAL LENGTH OF STAY:          0                 Timothy Lasso, MD

## 2022-05-09 NOTE — Other (Signed)
Primary RN SBAR: Geanie Logan, Rn  Patient Education: Procedural  Hospital associated wait time; reason: none  Hepatitis B Surface Ag   Date/Time Value Ref Range Status   05/07/2022 09:35 PM <0.10 Index Final     Hep B S Ab   Date/Time Value Ref Range Status   05/07/2022 09:35 PM 277.97 mIU/mL Final      PRE HD TREATMENT   05/09/22 1740   Vital Signs   BP 128/84   Temp 99.2 F (37.3 C)   Pulse 79   Respirations (!) 6   Pain Assessment   Pain Assessment 0-10   Pain Level 8   Pain Location Flank   Pain Orientation Left   Pain Descriptors Aching   Treatment   Time On 1754   Treatment Goal 2000 mL   Observations & Evaluations   Level of Consciousness 0   Oriented X 4   Heart Rhythm Regular  (Sinus rhythm on monitor)   Respiratory Quality/Effort Unlabored   O2 Device None (Room air)   Bilateral Breath Sounds Clear   Skin Color   (appropriate for race)   Skin Condition/Temp Warm   Edema Right lower extremity;Left lower extremity   RLE Edema Trace   LLE Edema Trace   Technical Checks   Dialysis Machine No. [id number]   RO Machine Number [id number]   Dialyzer Lot No. [id number]   Tubing Lot Number [id number]   All Connections Secure Yes   NS Bag Yes   Saline Line Double Clamped Yes   Dialyzer Revaclear 300   Prime Volume (mL) 200 mL   ICEBOAT I;C;E;B;O;A;T   RO Machine Log Sheet Completed Yes   Machine Alarm Self Test Completed;Passed   Materials engineer Function;pH Reading   Animal nutritionist Conductivity 13.6   Manual Conductivity 13.8   Manual Ph 7.4   Bleach Test (Neg) Yes   Bath Temperature 98.6 F (37 C)   Dialysis Bath   K+ (Potassium) 2   Ca+ (Calcium) 2.5   Na+ (Sodium) 138   HCO3 (Bicarb) 38   Bicarbonate Concentrate Lot No. [id number]   Acid Concentrate Lot No. [id number]   Treatment Initiation   Dialyze Hours 3.5   Treatment  Initiation Universal Precautions maintained;Lines secured to patient;Connections secured;Prime given;Venous Parameters set;Arterial Parameters  set;Chiropodist engaged;[place] Device;Dialysate;Saline line double clamped;Dialyzer;REV-300   Hemodialysis Fistula/Graft Arteriovenous fistula Right Arm   Placement Date/Time: 05/07/22 0820   Present on Admission/Arrival: Yes  Access Type: Arteriovenous fistula  Orientation: Right  Access Location: Arm   Site Assessment Clean, dry & intact   Thrill Present   Bruit Present   Status Accessed   Venous Needle Size 15 G   Arterial Needle Size 15 G   Accessed By: K. Satvik Parco, RN   Access Attempts  1   Access Interventions Chlorhexidine;Aseptic Technique;Needles taped to patient     POST HD TREATMENT   05/09/22 2130   Vital Signs   BP 121/77   Temp 97.5 F (36.4 C)   Pulse 77   Respirations 15   Pain Assessment   Pain Assessment 0-10   Pain Location Flank   Pain Orientation Left   Patient's Stated Pain Goal 0 - No pain   Post-Hemodialysis Assessment   Post-Treatment Procedures Blood returned;Access bleeding time < 10 minutes   Automotive engineer;Exterior Contractor Volume (ml) 300 ml   Blood Volume Processed (Liters) 76.4 L  Dialyzer Clearance Lightly streaked   Duration of Treatment (minutes) 210 minutes   Heparin Amount Administered During Treatment (mL) 0 mL   Hemodialysis Intake (ml) 500 ml   Hemodialysis Output (ml) 2500 ml   NET Removed (ml) 2000   Tolerated Treatment Good   Patient Response to Treatment Tolerated well   Bilateral Breath Sounds Clear   RLE Edema Trace   LLE Edema Trace   Time Off 2124   Patient Disposition   (Remain in room 418)   Observations & Evaluations   Level of Consciousness 0   Oriented X 4   Heart Rhythm Regular  (Sinus rhythm on monitor)   Respiratory Quality/Effort Unlabored   O2 Device None (Room air)   Skin Condition/Temp Warm         Primary RN SBAR: Levan Hurst, RN  Comments: HD completed and tolerated well. No issues intra HD. All possible blood returned. Needles removed and pressure held until hemostasis  achieved. New dressing applied. AV access with good thrill and bruit. Pt remain alert and oriented. Denies chest pain. NO SOB. VSS. Bed lowered and locked. Side rails up x 2. Call bell in reach. RN updated.

## 2022-05-10 LAB — EKG 12-LEAD
Atrial Rate: 78 {beats}/min
P Axis: 41 degrees
P-R Interval: 234 ms
Q-T Interval: 386 ms
QRS Duration: 104 ms
QTc Calculation (Bazett): 440 ms
R Axis: 20 degrees
T Axis: 53 degrees
Ventricular Rate: 78 {beats}/min

## 2022-05-10 LAB — CBC WITH AUTO DIFFERENTIAL
Absolute Immature Granulocyte: 0 10*3/uL (ref 0.00–0.04)
Basophils %: 1 % (ref 0–1)
Basophils Absolute: 0 10*3/uL (ref 0.0–0.1)
Eosinophils %: 6 % (ref 0–7)
Eosinophils Absolute: 0.3 10*3/uL (ref 0.0–0.4)
Hematocrit: 24.9 % — ABNORMAL LOW (ref 35.0–47.0)
Hemoglobin: 7.5 g/dL — ABNORMAL LOW (ref 11.5–16.0)
Immature Granulocytes: 1 % — ABNORMAL HIGH (ref 0.0–0.5)
Lymphocytes %: 13 % (ref 12–49)
Lymphocytes Absolute: 0.5 10*3/uL — ABNORMAL LOW (ref 0.8–3.5)
MCH: 29.9 PG (ref 26.0–34.0)
MCHC: 30.1 g/dL (ref 30.0–36.5)
MCV: 99.2 FL — ABNORMAL HIGH (ref 80.0–99.0)
MPV: 9.4 FL (ref 8.9–12.9)
Monocytes %: 9 % (ref 5–13)
Monocytes Absolute: 0.4 10*3/uL (ref 0.0–1.0)
Neutrophils %: 70 % (ref 32–75)
Neutrophils Absolute: 3 10*3/uL (ref 1.8–8.0)
Nucleated RBCs: 0 PER 100 WBC
Platelets: 287 10*3/uL (ref 150–400)
RBC: 2.51 M/uL — ABNORMAL LOW (ref 3.80–5.20)
RDW: 16.3 % — ABNORMAL HIGH (ref 11.5–14.5)
WBC: 4.2 10*3/uL (ref 3.6–11.0)
nRBC: 0 10*3/uL (ref 0.00–0.01)

## 2022-05-10 LAB — BASIC METABOLIC PANEL
Anion Gap: 6 mmol/L (ref 5–15)
Anion Gap: 8 mmol/L (ref 5–15)
BUN: 20 MG/DL (ref 6–20)
BUN: 32 MG/DL — ABNORMAL HIGH (ref 6–20)
Bun/Cre Ratio: 4 — ABNORMAL LOW (ref 12–20)
Bun/Cre Ratio: 5 — ABNORMAL LOW (ref 12–20)
CO2: 27 mmol/L (ref 21–32)
CO2: 31 mmol/L (ref 21–32)
Calcium: 8.6 MG/DL (ref 8.5–10.1)
Calcium: 8.7 MG/DL (ref 8.5–10.1)
Chloride: 97 mmol/L (ref 97–108)
Chloride: 99 mmol/L (ref 97–108)
Creatinine: 5.62 MG/DL — ABNORMAL HIGH (ref 0.55–1.02)
Creatinine: 7.1 MG/DL — ABNORMAL HIGH (ref 0.55–1.02)
Est, Glom Filt Rate: 10 mL/min/{1.73_m2} — ABNORMAL LOW (ref >60)
Est, Glom Filt Rate: 7 mL/min/{1.73_m2} — ABNORMAL LOW (ref >60)
Glucose: 105 mg/dL — ABNORMAL HIGH (ref 65–100)
Glucose: 89 mg/dL (ref 65–100)
Potassium: 3.9 mmol/L (ref 3.5–5.1)
Potassium: 5.2 mmol/L — ABNORMAL HIGH (ref 3.5–5.1)
Sodium: 132 mmol/L — ABNORMAL LOW (ref 136–145)
Sodium: 136 mmol/L (ref 136–145)

## 2022-05-10 LAB — CBC
Hematocrit: 25.6 % — ABNORMAL LOW (ref 35.0–47.0)
Hemoglobin: 7.8 g/dL — ABNORMAL LOW (ref 11.5–16.0)
MCH: 30.1 PG (ref 26.0–34.0)
MCHC: 30.5 g/dL (ref 30.0–36.5)
MCV: 98.8 FL (ref 80.0–99.0)
MPV: 9 FL (ref 8.9–12.9)
Nucleated RBCs: 0 PER 100 WBC
Platelets: 275 10*3/uL (ref 150–400)
RBC: 2.59 M/uL — ABNORMAL LOW (ref 3.80–5.20)
RDW: 16.1 % — ABNORMAL HIGH (ref 11.5–14.5)
WBC: 4 10*3/uL (ref 3.6–11.0)
nRBC: 0 10*3/uL (ref 0.00–0.01)

## 2022-05-10 MED FILL — ONDANSETRON HCL 4 MG/2ML IJ SOLN: 4 MG/2ML | INTRAMUSCULAR | Qty: 2

## 2022-05-10 MED FILL — CALCIUM ACETATE (PHOS BINDER) 667 MG PO TABS: 667 MG | ORAL | Qty: 1

## 2022-05-10 MED FILL — HYDROMORPHONE HCL 1 MG/ML IJ SOLN: 1 MG/ML | INTRAMUSCULAR | Qty: 1

## 2022-05-10 MED FILL — PROMETHAZINE HCL 25 MG PO TABS: 25 MG | ORAL | Qty: 1

## 2022-05-10 MED FILL — AMLODIPINE BESYLATE 5 MG PO TABS: 5 MG | ORAL | Qty: 2

## 2022-05-10 MED FILL — LOKELMA 5 G PO PACK: 5 g | ORAL | Qty: 2

## 2022-05-10 NOTE — Progress Notes (Signed)
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 Denies CP or SOB. Tolerated HD w/o issues- 2 L removed.    Review of Systems: Pertinent items are noted in HPI.    Current Medications:   Current Facility-Administered Medications   Medication Dose Route Frequency    sodium zirconium cyclosilicate (LOKELMA) oral suspension 10 g  10 g Oral Daily    prochlorperazine (COMPAZINE) injection 10 mg  10 mg IntraVENous Q6H PRN    HYDROmorphone HCl PF (DILAUDID) injection 1 mg  1 mg IntraVENous Q4H PRN    epoetin alfa-epbx (RETACRIT) injection 15,000 Units  15,000 Units SubCUTAneous Once per day on Mon Wed Fri    promethazine (PHENERGAN) tablet 25 mg  25 mg Oral Q6H PRN    sodium chloride flush 0.9 % injection 5-40 mL  5-40 mL IntraVENous 2 times per day    sodium chloride flush 0.9 % injection 5-40 mL  5-40 mL IntraVENous PRN    0.9 % sodium chloride infusion   IntraVENous PRN    ondansetron (ZOFRAN-ODT) disintegrating tablet 4 mg  4 mg Oral Q8H PRN    Or    ondansetron (ZOFRAN) injection 4 mg  4 mg IntraVENous Q6H PRN    polyethylene glycol (GLYCOLAX) packet 17 g  17 g Oral Daily PRN    amLODIPine (NORVASC) tablet 10 mg  10 mg Oral Daily    calcium acetate (PHOSLO) tablet 667 mg  667 mg Oral TID WC      Allergies    Allergen Reactions    Ace Inhibitors Anaphylaxis     Pt states "all of these have closed my throat"    Acetaminophen Anaphylaxis     Pt states "all of these have closed my throat"    Codeine Anaphylaxis     Pt states "all of these have closed my throat"    Fentanyl Anaphylaxis    Hydrocodone Anaphylaxis     Pt states "all of these have closed my throat"    Nsaids Other (See Comments)     pcp states not to take due to kidneys     Oxycodone Anaphylaxis     Pt states "all of these have closed my throat"    Sulfa Antibiotics Anaphylaxis       Objective:  Vitals:    Vitals:    05/10/22 0330 05/10/22 0351 05/10/22 0556 05/10/22 0714   BP:    126/69   Pulse:  82 85 82   Resp:    17  Temp:       TempSrc:       SpO2: 99%      Height:         Intake and Output:  No intake/output data recorded.  02/18 1901 - 02/20 0700  In: 500   Out: 2500     Physical Examination:  General: NAD,Conversant   Neck:  Supple, no mass  Resp:  Lungs CTA B/L, no wheezing , normal respiratory effort  CV:  RRR, + murmur , trace B/L LE edema  GI:  Soft, NT, + BS, no HS megaly  Neurologic:  Non focal  Psych:             AAO x 3 appropriate affect   Skin:  No Rash  Dialysis Access : RUE AVF + bruit    []$     High complexity decision making was performed  []$     Patient is at high-risk of decompensation with multiple organ involvement    Lab Data Personally Reviewed: I have reviewed all the pertinent labs, microbiology data and radiology studies during assessment.    Labs:  Recent Labs     05/07/22  2135 05/08/22  0201 05/09/22  1757   NA 133* 135* 136   K HEMOLYZED,RECOLLECT REQUESTED 5.7* 5.2*   CL 98 102 99   CO2 28 27 31   $ GLUCOSE 176* 124* 89   BUN 42* 42* 32*   CREATININE 7.11* 7.23* 7.10*   CALCIUM 9.2 9.4 8.7         Recent Labs     05/07/22  1432 05/08/22  0201 05/08/22  1601 05/09/22  1757   WBC 5.7 6.2  --  4.2   RBC 2.54* 2.51*  --  2.51*   HGB 7.5* 7.7* 8.0* 7.5*   HCT 23.9* 23.9* 25.9* 24.9*   MCV 94.1 95.2  --  99.2*   MCH 29.5 30.7  --   29.9   MCHC 31.4 32.2  --  30.1   RDW 16.0* 16.0*  --  16.3*   PLT 279 302  --  287   MPV 9.4 10.3  --  9.4       Recent Labs     05/07/22  1432 05/08/22  0201   GLOB 4.2* 4.2*       Recent Labs     05/08/22  0201   INR 1.1   APTT 28.8        No results for input(s): "CPK", "CKMB", "TROPONINI" in the last 72 hours.    Invalid input(s): "B-NP"  Invalid input(s): "PHI", "PCO2I", "PO2I", "FIO2I"     Ventilator:       Microbiology:  No results found for: "SDES"  No components found for: "CULT"      I have reviewed the flowsheets.  Chart and Pertinent Notes have been reviewed.   No change in PMH ,family and social history from Consult note.      Maurine Cane, APRN - NP  [facility]

## 2022-05-10 NOTE — Progress Notes (Cosign Needed)
Patient: Meredith Hawkins MRN: [id number]  SSN: [id number]    Date of Birth: 16-Jun-1985  Age: 37 y.o.  Sex: female        ADMITTED: 05/07/2022 to Wandra Feinstein, MD by Marigene Ehlers, MD for Hyperkalemia [E87.5]  ESRD (end stage renal disease) ([place]) [N18.6]  ESRD on hemodialysis ([place]) [N18.6, Z99.2]  Renal hemorrhage, left [N28.89]    Ora Seegert is doing fair. Complains of persistent unchanged bilateral abdominal pain and left pain (L>R), requiring IV dilaudid. She is a difficult stick, no labs yet this morning. Vitals stable. Denies lightheadedness or dizziness.     Vitals: Temp (24hrs), Avg:98.8 F (37.1 C), Min:97.5 F (36.4 C), Max:99.3 F (37.4 C)    Blood pressure 126/69, pulse 82, temperature 99 F (37.2 C), temperature source Oral, resp. rate 17, height 1.676 m (5' 6"$ ), last menstrual period 04/25/2022, SpO2 99 %.    Intake and Output:  02/18 1901 - 02/20 0700  In: 500   Out: 2500   No intake/output data recorded.  JP Output lats 24 hrs: No data found.   JP Output last 8 hrs: No data found.  BM over last 24 hrs: No data found.    Physical Exam  General: NAD, resting in bed   Respiratory: no distress, breathing easily, room air  Abdomen: patient again refuses abdominal exam 2/2 pain, no visible abnormality, points to left flank and LLQ where pain is the worst    GU: voiding independently, no UA to assess, refuses exam   Neuro: Appropriate, no focal neurological deficits  Skin: warm, dry  Extremities: moves all, full ROM    Labs:  CBC:   Lab Results   Component Value Date/Time    WBC 4.2 05/09/2022 05:57 PM    HCT 24.9 05/09/2022 05:57 PM    PLT 287 05/09/2022 05:57 PM     BMP:   Lab Results   Component Value Date/Time    NA 136 05/09/2022 05:57 PM    K 5.2 05/09/2022 05:57 PM    CL 99 05/09/2022 05:57 PM    CO2 31 05/09/2022 05:57 PM    BUN 32 05/09/2022 05:57 PM      Assessment/Plan:   37 yo F with hx of ESRD on HD with Left renal hemorrhage, anemia      Follow serial H/H, Transfuse if  hemodynamically unstable - difficult stick, labs today pending   Continue to hold anticoagulants  Consideration of IR embolization if hgb drops requiring transfusion    Following      Supervising MD, Dr. Percell Miller   Signed By: Legrand Rams, APRN - NP - May 10, 2022

## 2022-05-10 NOTE — Progress Notes (Signed)
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       Interval history / Subjective:   States pain slightly better, no n/v, no AM labs due to difficult stick     Assessment & Plan:     L renal hemorrhage  ESRD on HD, mild hyperK+  Anemia  History of SVC syndrome due ot thrombus  HTN  Asthma  ?SLE    -monitor H/H - stable so far  -urology recs appreciated - if worsens needs IR embo, hold anticoagulants   -pain control  -BP control  -HD per nephrology   -diet       Code status: full  Prophylaxis: SCDs  Care Plan discussed with: pt rn cm  Anticipated Disposition:   Inpatient  Cardiac monitoring: Telemetry  Central Line:            Social Determinants of Health     Tobacco Use: Low Risk  (05/07/2022)    Patient History     Smoking Tobacco Use: Never     Smokeless Tobacco Use: Never     Passive Exposure: Not on file   Alcohol Use: Not At Risk (05/07/2022)    AUDIT-C     Frequency of Alcohol Consumption: Never     Average Number of Drinks: Patient does not drink     Frequency of Binge Drinking: Never   Emergency planning/management officer Strain: Not on file   Food Insecurity: Not on file   Transportation Needs: Not on file   Physical Activity: Not on file   Stress: Not on file   Social Connections: Not on file   Intimate Partner Violence: Not on file   Depression: Not on file   Housing Stability: Not on file   Interpersonal Safety: Not on file   Utilities: Not on file       Review of Systems:   A comprehensive review of systems was negative.       Vital Signs:    Last 24hrs VS reviewed since prior progress  note. Most recent are:  Vitals:    05/10/22 0714   BP: 126/69   Pulse: 82   Resp: 17   Temp:    SpO2:          Intake/Output Summary (Last 24 hours) at 05/10/2022 [id number]  Last data filed at 05/09/2022 2130  Gross per 24 hour   Intake 500 ml   Output 2500 ml   Net -2000 ml          Physical Examination:  I had a face to face encounter with this patient and independently examined them on 05/10/2022 as outlined below:          NAD  RR  Normal work of breathing  Neuro grossly non-focal    Data Review:    Review and/or order of clinical lab test  Review and/or order of tests in the radiology section of CPT  Review and/or order of tests in the medicine section of CPT      I have personally and independently reviewed all pertinent labs, diagnostic studies, imaging, and have provided independent interpretation of the same.     Labs:     Recent Labs     05/08/22  0201 05/08/22  1601 05/09/22  1757   WBC 6.2  --  4.2   HGB 7.7* 8.0* 7.5*   HCT 23.9* 25.9* 24.9*   PLT 302  --  287       Recent Labs     05/07/22  2135 05/08/22  0201 05/09/22  1757   NA 133* 135* 136   K HEMOLYZED,RECOLLECT REQUESTED 5.7* 5.2*   CL 98 102 99   CO2 28 27 31   $ BUN 42* 42* 32*   MG HEMOLYZED,RECOLLECT REQUESTED  --   --    PHOS 3.2 3.2  --        Recent Labs     05/07/22  1432 05/08/22  0201   ALT 6* <6*   GLOB 4.2* 4.2*       Recent Labs     05/08/22  0201   INR 1.1   APTT 28.8        Recent Labs     05/07/22  2135   TIBC 254        No results found for: "FOL", "RBCF"   No results for input(s): "PH", "PCO2", "PO2" in the last 72 hours.  No results for input(s): "CPK" in the last 72 hours.    Invalid input(s): "CPKMB", "CKNDX", "TROIQ"  No results found for: "CHOL", "[place]", "CHLST", "CHOLV", "HDL", "HDLC", "LDL", "LDLC", "TGLX", "TRIGL"  No results found for: "GLUCPOC"  @LABUA$ @    Notes reviewed from all clinical/nonclinical/nursing services involved in patient's clinical care. Care coordination discussions were held with appropriate  clinical/nonclinical/ nursing providers based on care coordination needs.         Patients current active Medications were reviewed, considered, added and adjusted based on the clinical condition today.      Home Medications were reconciled to the best of my ability given all available resources at the time of admission. Route is PO if not otherwise noted.      Admission Status:[id number]:::1}      Medications Reviewed:     Current Facility-Administered Medications   Medication Dose Route Frequency    sodium zirconium cyclosilicate (LOKELMA) oral suspension 10 g  10 g Oral Daily    prochlorperazine (COMPAZINE) injection 10 mg  10 mg IntraVENous Q6H PRN    HYDROmorphone HCl PF (DILAUDID) injection 1 mg  1 mg IntraVENous Q4H PRN    epoetin alfa-epbx (RETACRIT) injection 15,000 Units  15,000 Units SubCUTAneous Once per day on Mon Wed Fri    promethazine (PHENERGAN) tablet 25 mg  25 mg Oral Q6H PRN    sodium chloride flush 0.9 % injection 5-40 mL  5-40 mL IntraVENous 2 times per day    sodium chloride flush 0.9 % injection 5-40 mL  5-40 mL IntraVENous PRN    0.9 % sodium  chloride infusion   IntraVENous PRN    ondansetron (ZOFRAN-ODT) disintegrating tablet 4 mg  4 mg Oral Q8H PRN    Or    ondansetron (ZOFRAN) injection 4 mg  4 mg IntraVENous Q6H PRN    polyethylene glycol (GLYCOLAX) packet 17 g  17 g Oral Daily PRN    amLODIPine (NORVASC) tablet 10 mg  10 mg Oral Daily    calcium acetate (PHOSLO) tablet 667 mg  667 mg Oral TID WC     ______________________________________________________________________  EXPECTED LENGTH OF STAY: 2  ACTUAL LENGTH OF STAY:          1                 Timothy Lasso, MD

## 2022-05-10 NOTE — Progress Notes (Signed)
Ultrasound guided blood work done.

## 2022-05-10 NOTE — Progress Notes (Addendum)
Pt is a difficult stick, unable to collect labs at this time, cbc collected sample rejected due to insufficient sample amount.    0730 am supplies provided for hygenic needs pt independent after set up

## 2022-05-10 NOTE — Plan of Care (Signed)
Problem: Discharge Planning  Goal: Discharge to home or other facility with appropriate resources  Outcome: Progressing     Problem: Pain  Goal: Verbalizes/displays adequate comfort level or baseline comfort level  Outcome: Progressing

## 2022-05-10 NOTE — Progress Notes (Signed)
Referral source:   Deeann Cree at [facility] in [facility] 4 IMCU 2. Chaplain attended rounds in the [facility] as part of the Interdisciplinary team where the patient's ongoing care was discussed. I reviewed the medical record as part of this encounter.     Outcome: Interdisciplinary team are aware of chaplain availability and were encouraged to request support as needed.      Advised nurse to contact [facility] for any further referrals.The chaplain on-call can be reached at [phone]).     Loel Betancur. Etheleen Mayhew, MDiv, [facility]  Staff Chaplain

## 2022-05-11 LAB — BASIC METABOLIC PANEL
Anion Gap: 5 mmol/L (ref 5–15)
BUN: 28 MG/DL — ABNORMAL HIGH (ref 6–20)
Bun/Cre Ratio: 4 — ABNORMAL LOW (ref 12–20)
CO2: 31 mmol/L (ref 21–32)
Calcium: 8.7 MG/DL (ref 8.5–10.1)
Chloride: 99 mmol/L (ref 97–108)
Creatinine: 7.47 MG/DL — ABNORMAL HIGH (ref 0.55–1.02)
Est, Glom Filt Rate: 7 mL/min/{1.73_m2} — ABNORMAL LOW (ref >60)
Glucose: 81 mg/dL (ref 65–100)
Potassium: 4.3 mmol/L (ref 3.5–5.1)
Sodium: 135 mmol/L — ABNORMAL LOW (ref 136–145)

## 2022-05-11 LAB — HEMOGLOBIN AND HEMATOCRIT
Hematocrit: 25.3 % — ABNORMAL LOW (ref 35.0–47.0)
Hemoglobin: 8 g/dL — ABNORMAL LOW (ref 11.5–16.0)

## 2022-05-11 LAB — CBC
Hematocrit: 24.2 % — ABNORMAL LOW (ref 35.0–47.0)
Hemoglobin: 7.6 g/dL — ABNORMAL LOW (ref 11.5–16.0)
MCH: 29.9 PG (ref 26.0–34.0)
MCHC: 31.4 g/dL (ref 30.0–36.5)
MCV: 95.3 FL (ref 80.0–99.0)
MPV: 9.6 FL (ref 8.9–12.9)
Nucleated RBCs: 0 PER 100 WBC
Platelets: 283 10*3/uL (ref 150–400)
RBC: 2.54 M/uL — ABNORMAL LOW (ref 3.80–5.20)
RDW: 15.9 % — ABNORMAL HIGH (ref 11.5–14.5)
WBC: 3.4 10*3/uL — ABNORMAL LOW (ref 3.6–11.0)
nRBC: 0 10*3/uL (ref 0.00–0.01)

## 2022-05-11 MED ORDER — EPOETIN ALFA-EPBX 10000 UNIT/ML IJ SOLN
10000 UNIT/ML | INTRAMUSCULAR | Status: AC
Start: 2022-05-11 — End: 2022-05-18
  Administered 2022-05-11 – 2022-05-16 (×3): 20000 [IU] via SUBCUTANEOUS

## 2022-05-11 MED FILL — ONDANSETRON HCL 4 MG/2ML IJ SOLN: 4 MG/2ML | INTRAMUSCULAR | Qty: 2

## 2022-05-11 MED FILL — CALCIUM ACETATE (PHOS BINDER) 667 MG PO TABS: 667 MG | ORAL | Qty: 1

## 2022-05-11 MED FILL — PROMETHAZINE HCL 25 MG PO TABS: 25 MG | ORAL | Qty: 1

## 2022-05-11 MED FILL — RETACRIT 10000 UNIT/ML IJ SOLN: 10000 UNIT/ML | INTRAMUSCULAR | Qty: 2

## 2022-05-11 MED FILL — HYDROMORPHONE HCL 1 MG/ML IJ SOLN: 1 MG/ML | INTRAMUSCULAR | Qty: 1

## 2022-05-11 MED FILL — AMLODIPINE BESYLATE 5 MG PO TABS: 5 MG | ORAL | Qty: 2

## 2022-05-11 MED FILL — LOKELMA 5 G PO PACK: 5 g | ORAL | Qty: 2

## 2022-05-11 NOTE — Progress Notes (Signed)
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     Interval History:  Seen and examined this AM. Denies CP or SOB. HD getting ready to start plans for  2 L removed.    Review of Systems: Pertinent items are noted in HPI.    Current Medications:   Current Facility-Administered Medications   Medication Dose Route Frequency    sodium zirconium cyclosilicate (LOKELMA) oral suspension 10 g  10 g Oral Daily    prochlorperazine (COMPAZINE) injection 10 mg  10 mg IntraVENous Q6H PRN    HYDROmorphone HCl PF (DILAUDID) injection 1 mg  1 mg IntraVENous Q4H PRN    epoetin alfa-epbx (RETACRIT) injection 15,000 Units  15,000 Units SubCUTAneous Once per day on Mon Wed Fri    promethazine (PHENERGAN) tablet 25 mg  25 mg Oral Q6H PRN    sodium chloride flush 0.9 % injection 5-40 mL  5-40 mL IntraVENous 2 times per day    sodium chloride flush 0.9 % injection 5-40 mL  5-40 mL IntraVENous PRN    0.9 % sodium chloride infusion   IntraVENous PRN    ondansetron (ZOFRAN-ODT) disintegrating tablet 4 mg  4 mg Oral Q8H PRN    Or    ondansetron (ZOFRAN) injection 4 mg  4 mg IntraVENous Q6H PRN    polyethylene glycol (GLYCOLAX) packet 17 g  17 g Oral Daily PRN    amLODIPine (NORVASC) tablet 10 mg  10 mg Oral Daily    calcium acetate  (PHOSLO) tablet 667 mg  667 mg Oral TID WC      Allergies   Allergen Reactions    Ace Inhibitors Anaphylaxis     Pt states "all of these have closed my throat"    Acetaminophen Anaphylaxis     Pt states "all of these have closed my throat"    Codeine Anaphylaxis     Pt states "all of these have closed my throat"    Fentanyl Anaphylaxis    Hydrocodone Anaphylaxis     Pt states "all of these have closed my throat"    Nsaids Other (See Comments)     pcp states not to take due to kidneys     Oxycodone Anaphylaxis     Pt states "all of these have closed my throat"    Sulfa Antibiotics Anaphylaxis       Objective:  Vitals:    Vitals:    05/11/22 0550 05/11/22 0600 05/11/22 0704 05/11/22 0844   BP:  113/76 111/72  Pulse: 74 73 69    Resp:  '13 14 10   '$ Temp:   98.3 F (36.8 C)    TempSrc:   Oral    SpO2:   100%    Height:         Intake and Output:  No intake/output data recorded.  02/19 1901 - 02/21 0700  In: 500   Out: 2500     Physical Examination:  General: NAD,Conversant   Neck:  Supple, no mass  Resp:  Lungs CTA B/L, no wheezing , normal respiratory effort  CV:  RRR, + murmur , trace B/L LE edema  GI:  Soft, NT, + BS, no HS megaly  Neurologic:  Non focal  Psych:             AAO x 3 appropriate affect   Skin:  No Rash  Dialysis Access : RUE AVF + bruit    '[]'$     High complexity decision making was performed  '[]'$     Patient is at high-risk of decompensation with multiple organ involvement    Lab Data Personally Reviewed: I have reviewed all the pertinent labs, microbiology data and radiology studies during assessment.    Labs:  Recent Labs     05/09/22  1757 05/10/22  1425   NA 136 132*   K 5.2* 3.9   CL 99 97   CO2 31 27   GLUCOSE 89 105*   BUN 32* 20   CREATININE 7.10* 5.62*   CALCIUM 8.7 8.6         Recent Labs     05/08/22  1601 05/09/22  1757 05/10/22  1425   WBC  --  4.2 4.0   RBC  --  2.51* 2.59*   HGB 8.0* 7.5* 7.8*   HCT 25.9* 24.9* 25.6*   MCV  --  99.2* 98.8   MCH  --  29.9 30.1   MCHC  --  30.1 30.5   RDW   --  16.3* 16.1*   PLT  --  287 275   MPV  --  9.4 9.0       No results for input(s): "TP", "ALB", "GLOB", "AML" in the last 72 hours.    Invalid input(s): "SGOT", "GPT", "AP", "TBIL", "AMYP", "LPSE", "LAC"    No results for input(s): "INR", "APTT" in the last 72 hours.    Invalid input(s): "PTP"     No results for input(s): "CPK", "CKMB", "TROPONINI" in the last 72 hours.    Invalid input(s): "B-NP"  Invalid input(s): "PHI", "PCO2I", "PO2I", "FIO2I"     Ventilator:       Microbiology:  No results found for: "SDES"  No components found for: "CULT"      I have reviewed the flowsheets.  Chart and Pertinent Notes have been reviewed.   No change in PMH ,family and social history from Consult note.      Maurine Cane, APRN - NP  [facility]

## 2022-05-11 NOTE — Progress Notes (Signed)
Patient: Meredith Hawkins MRN: [id number]  SSN: [id number]    Date of Birth: 08/15/1985  Age: 37 y.o.  Sex: female        ADMITTED: 05/07/2022 to Wandra Feinstein, MD by Marigene Ehlers, MD for Hyperkalemia [E87.5]  ESRD (end stage renal disease) ([place]) [N18.6]  ESRD on hemodialysis ([place]) [N18.6, Z99.2]  Renal hemorrhage, left [N28.89]  POD# * No surgery found *     Rozalia Stika is doing fair seen during dialysis.  Patient still complaining of diffuse abdominal pain however she states it is improving..  States that pain is controlled.    Vss afebrile   Labs pending this morning    Vitals: Temp (24hrs), Avg:98.8 F (37.1 C), Min:98.3 F (36.8 C), Max:99.9 F (37.7 C)    Blood pressure 103/69, pulse 81, temperature 98.3 F (36.8 C), temperature source Oral, resp. rate 16, height 1.676 m ('5\' 6"'$ ), last menstrual period 04/25/2022, SpO2 100 %.    Intake and Output:  02/19 1901 - 02/21 0700  In: 500   Out: 2500   No intake/output data recorded.  JP Output lats 24 hrs: No data found.   JP Output last 8 hrs: No data found.  BM over last 24 hrs: No data found.    Exam:   Gen: NAD  CV: extremities well perfused  Lungs: nonlabored respirations. Symmetric chest expansion  Ext: no edema  Abdomen: Refused abdominal exam  GU:  no void to visualize    Labs:  CBC:   Lab Results   Component Value Date/Time    WBC 4.0 05/10/2022 02:25 PM    HCT 25.6 05/10/2022 02:25 PM    PLT 275 05/10/2022 02:25 PM     BMP:   Lab Results   Component Value Date/Time    NA 135 05/11/2022 08:25 AM    K 4.3 05/11/2022 08:25 AM    CL 99 05/11/2022 08:25 AM    CO2 31 05/11/2022 08:25 AM    BUN 28 05/11/2022 08:25 AM     Cultures:   Results       ** No results found for the last 336 hours. **            Imaging: N/A  Assessment/Plan:   37 year old female with history of end-stage renal disease on hemodialysis admitted with left renal hemorrhage and anemia  1.  Labs pending this morning  -Continue to hold anticoagulants till hemoglobin  nadirs  -Will consider IR for embolization if traumatic drop of hemoglobin requiring transfusion    D/w Dr  Percell Miller   1400 hgb 7.6 stable urology will sign off for now     Signed By: Lanice Shirts, APRN - NP - May 11, 2022

## 2022-05-11 NOTE — Other (Signed)
HD INITIATION       05/11/22 0829   Treatment   Time On 0840   Treatment Goal 2L/3.5 hrs   Observations & Evaluations   Level of Consciousness 0   Oriented X 4   Heart Rhythm   (bedside telemetry)   Respiratory Quality/Effort Unlabored   O2 Device None (Room air)   Bilateral Breath Sounds Clear   Skin Condition/Temp Warm;Dry   Edema None   Vital Signs   BP 121/78   Temp 98.3 F (36.8 C)   Pulse 72   Respirations 11   SpO2 100 %   Pain Assessment   Pain Assessment   (patient has requested pain medicine)   Technical Checks   Dialysis Machine No. [id number]   RO Machine Number [id number]   Dialyzer Lot No. [id number]   Tubing Lot Number [id number]   All Connections Secure Yes   NS Bag Yes   Saline Line Double Clamped Yes   Dialyzer Revaclear 300   Prime Volume (mL) 200 mL   ICEBOAT I;C;E;B;O;A;T   RO Machine Log Sheet Completed Yes   Air Foam Detector Tested;Proper Function   Extracorporeal Circuit Tested for Integrity Yes   Machine Conductivity 13.9   Manual Conductivity 13.8   Manual Ph 7.4   Bleach Test (Neg) Yes   Bath Temperature 98.6 F (37 C)   Treatment Initiation   Dialyze Hours 3.5   Treatment  Initiation Universal Precautions maintained;Lines secured to patient;Connections secured;Prime given;Venous Parameters set;Arterial Parameters set;Air foam detector engaged;Dialysate;Saline line double clamped;Dialyzer   Hemodialysis Fistula/Graft Arteriovenous fistula Right Arm   Placement Date/Time: 05/07/22 0820   Present on Admission/Arrival: Yes  Access Type: Arteriovenous fistula  Orientation: Right  Access Location: Arm   Site Assessment Clean, dry & intact   Thrill Present   Bruit Present   Status Accessed   Venous Needle Size 15 G   Arterial Needle Size 15 G   Accessed By: r Landry Kamath rn   Access Attempts  1   Date of Last Dressing Change 05/11/22   Access Interventions Chlorhexidine;Needles taped to patient;Aseptic Technique   Dressing Intervention New   Dressing Status New dressing applied   Dialysis Bath   K+ (Potassium) 2    Ca+ (Calcium) 2.5   Na+ (Sodium) 138   HCO3 (Bicarb) 38   Bicarbonate Concentrate Lot No. [id number]   Acid Concentrate Lot No. [id number]     Primary RN SBAR: Bobbe Medico, RN   Patient Education: access care remove dressing 12-24 hours after HD  Hospital associated wait time; reason: 0  Hepatitis B Surface Ag   Date/Time Value Ref Range Status   05/07/2022 09:35 PM <0.10 Index Final     Hep B S Ab   Date/Time Value Ref Range Status   05/07/2022 09:35 PM 277.97 mIU/mL Final     0840  orders, labs, consents and code status reviewed. Prelabs drawn. HD initiated as ordered.  Norval Gable NP Nephrology in to see patient at this time.  No changes in current prescription  1015 patient on computer eating breakfast all with right fistula arm pressures increasing, needles assessed and re-taped bfr to 375.  1040 discussed with nephrology decreased blood flow rate patient resistant to adjusting needles, flushes easily no swelling no pain.  Blood flow rate adjusted to 300-350 no further changes.    HD TERMINATION    05/11/22 1200   Treatment   Time Off 1200   Observations & Evaluations   Level of Consciousness 0  Oriented X 4   Respiratory Quality/Effort Unlabored   O2 Device None (Room air)   Bilateral Breath Sounds Clear   Skin Condition/Temp Warm;Dry   Abdomen Inspection Rounded   Edema None   Vital Signs   BP 108/70   Temp 97.9 F (36.6 C)   Pulse 80   Respirations 10   During Hemodialysis Assessment   Ultrafiltration Removed (ml) 2300 ml   Hemodialysis Fistula/Graft Arteriovenous fistula Right Arm   Placement Date/Time: 05/07/22 0820   Present on Admission/Arrival: Yes  Access Type: Arteriovenous fistula  Orientation: Right  Access Location: Arm   Site Assessment Clean, dry & intact   Thrill Present   Bruit Present   Status Deaccessed   Accessed By: r Brittinie Wherley rn   Access Interventions Other (Comment)  (removal of needles)   Dressing Intervention New   Dressing Status Clean, dry & intact   Post-Hemodialysis Assessment    Post-Treatment Procedures Blood returned;Access bleeding time < 10 minutes   Herbalist   Rinseback Volume (ml) 300 ml   Blood Volume Processed (Liters) 61.5 L   Dialyzer Clearance Moderately streaked   Duration of Treatment (minutes) 195 minutes   Heparin Amount Administered During Treatment (mL) 0 mL   Hemodialysis Intake (ml) 500 ml   Hemodialysis Output (ml) 2300 ml   NET Removed (ml) 1800   Tolerated Treatment Good   Interventions Taken   (treatment terminated due to pending clotting nephrology aware all possible blood returned)   Patient Response to Treatment well     Primary RN SBARBobbe Medico, RN   Comments: with 20 minutes to go venous pressure and tmp changes indicating potential clotting.  After speaking with Norval Gable NP Nephrology treatment terminated, all possible blood returned, hemostasis achieved <10, sbar with primary nurse, on departure slide rails up x 2, call bell within reach bed in low position.

## 2022-05-11 NOTE — Plan of Care (Signed)
Problem: Discharge Planning  Goal: Discharge to home or other facility with appropriate resources  Outcome: Progressing     Problem: Pain  Goal: Verbalizes/displays adequate comfort level or baseline comfort level  Outcome: Progressing     Problem: Safety - Adult  Goal: Free from fall injury  Outcome: Progressing

## 2022-05-11 NOTE — Progress Notes (Signed)
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       Interval history / Subjective:   States pain slightly better, no n/v, difficult stick. Seen during HD     Assessment & Plan:     L renal hemorrhage  ESRD on HD, mild hyperK+  Anemia  History of SVC syndrome due ot thrombus  HTN  Asthma  ?SLE    -monitor H/H - stable so far  -urology recs appreciated - if worsens needs IR embo, hold anticoagulants   -pain control  -BP control  -HD per nephrology   -diet       Code status: full  Prophylaxis: SCDs  Care Plan discussed with: pt rn cm  Anticipated Disposition: home pending urology clearance, need to resume anticoagulation at some point  Inpatient  Cardiac monitoring: Telemetry -> transfer to medical  Central Line:            Social Determinants of Health     Tobacco Use: Low Risk  (05/07/2022)    Patient History     Smoking Tobacco Use: Never     Smokeless Tobacco Use: Never     Passive Exposure: Not on file   Alcohol Use: Not At Risk (05/07/2022)    AUDIT-C     Frequency of Alcohol Consumption: Never     Average Number of Drinks: Patient does not drink     Frequency of Binge Drinking: Never   Emergency planning/management officer Strain: Not on file   Food Insecurity: Not on file   Transportation Needs: Not on file   Physical Activity: Not on file   Stress: Not on file   Social Connections: Not on file   Intimate Partner Violence: Not on file   Depression: Not on file   Housing Stability: Not on file   Interpersonal Safety: Not on file   Utilities: Not on file       Review of Systems:   A comprehensive  review of systems was negative.       Vital Signs:    Last 24hrs VS reviewed since prior progress note. Most recent are:  Vitals:    05/11/22 1115   BP: 105/66   Pulse: 78   Resp: 15   Temp:    SpO2:        No intake or output data in the 24 hours ending 05/11/22 1121       Physical Examination:     I had a face to face encounter with this patient and independently examined them on  05/11/2022 as outlined below:          NAD  RR +systolic murmur  Normal work of breathing CTA b/l  L flank pain  Neuro grossly non-focal  Trace edema    Data Review:    Review and/or order of clinical lab test  Review and/or order of tests in the radiology section of CPT  Review and/or order of tests in the medicine section of CPT      I have personally and independently reviewed all pertinent labs, diagnostic studies, imaging, and have provided independent interpretation of the same.     Labs:     Recent Labs     05/09/22  1757 05/10/22  1425   WBC 4.2 4.0   HGB 7.5* 7.8*   HCT 24.9* 25.6*   PLT 287 275       Recent Labs     05/09/22  1757 05/10/22  1425 05/11/22  0825   NA 136 132* 135*   K 5.2* 3.9 4.3   CL 99 97 99   CO2 31 27 31   $ BUN 32* 20 28*       No results for input(s): "ALT", "TP", "ALB", "GLOB", "GGT", "AML" in the last 72 hours.    Invalid input(s): "SGOT", "GPT", "AP", "TBIL", "TBILI", "AMYP", "LPSE", "HLPSE"    No results for input(s): "INR", "APTT" in the last 72 hours.    Invalid input(s): "PTP"     No results for input(s): "TIBC", "FERR" in the last 72 hours.    Invalid input(s): "FE", "PSAT"     No results found for: "FOL", "RBCF"   No results for input(s): "PH", "PCO2", "PO2" in the last 72 hours.  No results for input(s): "CPK" in the last 72 hours.    Invalid input(s): "CPKMB", "CKNDX", "TROIQ"  No results found for: "CHOL", "[place]", "CHLST", "CHOLV", "HDL", "HDLC", "LDL", "LDLC", "TGLX", "TRIGL"  No results found for: "GLUCPOC"  @LABUA$ @    Notes reviewed from all clinical/nonclinical/nursing services involved in  patient's clinical care. Care coordination discussions were held with appropriate clinical/nonclinical/ nursing providers based on care coordination needs.         Patients current active Medications were reviewed, considered, added and adjusted based on the clinical condition today.      Home Medications were reconciled to the best of my ability given all available resources at the time of admission. Route is PO if not otherwise noted.      Admission Status:[id number]:::1}      Medications Reviewed:     Current Facility-Administered Medications   Medication Dose Route Frequency    epoetin alfa-epbx (RETACRIT) injection 20,000 Units  20,000 Units SubCUTAneous Once per day on Mon Wed Fri    sodium zirconium cyclosilicate (LOKELMA) oral suspension 10 g  10 g Oral Daily    prochlorperazine (COMPAZINE) injection 10 mg  10 mg IntraVENous Q6H PRN    HYDROmorphone HCl PF (DILAUDID) injection 1 mg  1 mg IntraVENous Q4H PRN    promethazine (PHENERGAN) tablet 25 mg  25 mg Oral Q6H PRN    sodium chloride flush 0.9 % injection 5-40 mL  5-40 mL IntraVENous 2 times per day    sodium chloride flush 0.9 % injection 5-40 mL  5-40 mL IntraVENous PRN    0.9 % sodium chloride infusion   IntraVENous PRN    ondansetron (ZOFRAN-ODT) disintegrating tablet 4 mg  4 mg Oral Q8H PRN    Or    ondansetron (ZOFRAN) injection 4 mg  4 mg IntraVENous Q6H PRN    polyethylene glycol (GLYCOLAX) packet 17 g  17 g Oral Daily PRN    amLODIPine (NORVASC) tablet 10 mg  10 mg Oral Daily    calcium acetate (PHOSLO) tablet 667 mg  667 mg Oral TID WC     ______________________________________________________________________  EXPECTED LENGTH OF STAY: 5  ACTUAL LENGTH OF STAY:          2                 Timothy Lasso, MD

## 2022-05-11 NOTE — Progress Notes (Signed)
TRANSFER - OUT REPORT:    Verbal report given to Mallory, RN on [employer]  being transferred to 5W for routine progression of patient care       Report consisted of patient's Situation, Background, Assessment and   Recommendations(SBAR).     Information from the following report(s) Nurse Handoff Report, Index, Adult Overview, Intake/Output, MAR, Cardiac Rhythm SR w 1AVb, Quality Measures, and Neuro Assessment was reviewed with the receiving nurse.           Lines:   Peripheral IV 05/07/22 Left (Active)   Site Assessment Clean, dry & intact 05/11/22 1200   Line Status Capped 05/11/22 1200   Line Care Ports disinfected 05/11/22 1200   Phlebitis Assessment No symptoms 05/11/22 1200   Infiltration Assessment 0 05/11/22 1200   Alcohol Cap Used Yes 05/11/22 1200   Dressing Status Clean, dry & intact 05/11/22 1200   Dressing Type Transparent 05/11/22 1200       Peripheral IV 05/08/22 Posterior;Left Forearm (Active)   Site Assessment Clean, dry & intact 05/11/22 1200   Line Status Flushed;Capped 05/11/22 1200   Line Care Cap changed;Ports disinfected 05/11/22 1200   Phlebitis Assessment No symptoms 05/11/22 1200   Infiltration Assessment 0 05/11/22 1200   Alcohol Cap Used Yes 05/11/22 1200   Dressing Status Clean, dry & intact 05/11/22 1200   Dressing Type Transparent 05/11/22 1200   Dressing Intervention New 05/08/22 1605        Opportunity for questions and clarification was provided.      Patient transported with:  Registered Nurse and [employer]

## 2022-05-12 LAB — CBC
Hematocrit: 25.4 % — ABNORMAL LOW (ref 35.0–47.0)
Hemoglobin: 7.9 g/dL — ABNORMAL LOW (ref 11.5–16.0)
MCH: 29.7 PG (ref 26.0–34.0)
MCHC: 31.1 g/dL (ref 30.0–36.5)
MCV: 95.5 FL (ref 80.0–99.0)
MPV: 9.5 FL (ref 8.9–12.9)
Nucleated RBCs: 0 PER 100 WBC
Platelets: 264 10*3/uL (ref 150–400)
RBC: 2.66 M/uL — ABNORMAL LOW (ref 3.80–5.20)
RDW: 15.9 % — ABNORMAL HIGH (ref 11.5–14.5)
WBC: 4 10*3/uL (ref 3.6–11.0)
nRBC: 0 10*3/uL (ref 0.00–0.01)

## 2022-05-12 LAB — BASIC METABOLIC PANEL
Anion Gap: 9 mmol/L (ref 5–15)
BUN: 25 MG/DL — ABNORMAL HIGH (ref 6–20)
Bun/Cre Ratio: 4 — ABNORMAL LOW (ref 12–20)
CO2: 28 mmol/L (ref 21–32)
Calcium: 9 MG/DL (ref 8.5–10.1)
Chloride: 97 mmol/L (ref 97–108)
Creatinine: 5.6 MG/DL — ABNORMAL HIGH (ref 0.55–1.02)
Est, Glom Filt Rate: 10 mL/min/{1.73_m2} — ABNORMAL LOW (ref >60)
Glucose: 77 mg/dL (ref 65–100)
Potassium: 3.8 mmol/L (ref 3.5–5.1)
Sodium: 134 mmol/L — ABNORMAL LOW (ref 136–145)

## 2022-05-12 LAB — HEMOGLOBIN AND HEMATOCRIT
Hematocrit: 24.4 % — ABNORMAL LOW (ref 35.0–47.0)
Hemoglobin: 7.9 g/dL — ABNORMAL LOW (ref 11.5–16.0)

## 2022-05-12 LAB — EXTRA TUBES HOLD

## 2022-05-12 MED FILL — HYDROMORPHONE HCL 1 MG/ML IJ SOLN: 1 MG/ML | INTRAMUSCULAR | Qty: 1

## 2022-05-12 MED FILL — PROMETHAZINE HCL 25 MG PO TABS: 25 MG | ORAL | Qty: 1

## 2022-05-12 MED FILL — CALCIUM ACETATE (PHOS BINDER) 667 MG PO TABS: 667 MG | ORAL | Qty: 1

## 2022-05-12 MED FILL — ONDANSETRON HCL 4 MG/2ML IJ SOLN: 4 MG/2ML | INTRAMUSCULAR | Qty: 2

## 2022-05-12 MED FILL — AMLODIPINE BESYLATE 5 MG PO TABS: 5 MG | ORAL | Qty: 2

## 2022-05-12 NOTE — Care Coordination-Inpatient (Addendum)
Transition of Care Plan:    RUR: 13%   Prior Level of Functioning: Independent   The pt is a resident of [place]  Disposition: Home with Family Assistance   OP HD:  ESRD on HD MWF in [place]   Follow up appointments: PCP   DME needed: None   Transportation at discharge: Family   IM/IMM Medicare/Tricare letter given: N/a   Emergency Contact: Branch,Corey (Other)  [phone]   Discharge Caregiver contacted prior to discharge? N/A   Care Conference needed? No   Barriers to discharge: Medical, awaiting Urology recs, need to resume anticoagulation prior to D/C   Admitting DX: ESRD (end stage renal disease) ([place])

## 2022-05-12 NOTE — Progress Notes (Signed)
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       Interval history / Subjective:   States pain slightly better,      Assessment & Plan:     L renal hemorrhage  ESRD on HD, mild hyperK+  Anemia  History of SVC syndrome due ot thrombus  HTN  Asthma  ?SLE    -monitor H/H - stable so far  -urology recs appreciated - if worsens needs IR embo, hold anticoagulants   -pain control  -BP control  -HD per nephrology   -CBC stable now, may ok to re-start [facility]: unsure of she need long-term AC   Her SVC syndrome was diagnosed in 2019-2020, related to her previous perm cath.   Unsure if she has lupus coagulants.   -will consult vascular surgeon and hematologist to decide if she needs resume anticoagulants: ordered hyper coagulants work up   -if she definitely need life long AC , consider start heparin drip first and monitor cbc, if stable h&H in 2 days then consider change to [facility].        Code status: full  Prophylaxis: SCDs  Care Plan discussed with: pt rn cm  Anticipated Disposition:TBD, need to decide if should resume anticoagulation at some point  Inpatient  Cardiac monitoring: Telemetry -> transfer to medical  Central Line:            Social Determinants of Health     Tobacco Use: Low Risk  (05/07/2022)    Patient History     Smoking Tobacco Use: Never     Smokeless Tobacco Use: Never     Passive Exposure: Not on file   Alcohol Use: Not At Risk (05/07/2022)    AUDIT-C     Frequency of Alcohol Consumption: Never     Average Number of Drinks: Patient does not drink     Frequency of Binge Drinking:  Never   Emergency planning/management officer Strain: Not on file   Food Insecurity: Not on file   Transportation Needs: Not on file   Physical Activity: Not on file   Stress: Not on file   Social Connections: Not on file   Intimate Partner Violence: Not on file   Depression: Not on file   Housing Stability: Not on file   Interpersonal Safety: Not on file   Utilities: Not on file       Review of Systems:   A comprehensive review of systems was negative.       Vital Signs:    Last 24hrs VS reviewed  since prior progress note. Most recent are:  Vitals:    05/12/22 1444   BP: 109/65   Pulse: 71   Resp: 18   Temp: 99.1 F (37.3 C)   SpO2: 100%       No intake or output data in the 24 hours ending 05/12/22 1515       Physical Examination:     I had a face to face encounter with this patient and independently examined them on 05/12/2022 as outlined below:          NAD  RR +systolic murmur  Normal work of breathing CTA b/l  L flank pain  Neuro grossly non-focal  Trace edema    Data Review:    Review and/or order of clinical lab test  Review and/or order of tests in the radiology section of CPT  Review and/or order of tests in the medicine section of CPT      I have personally and independently reviewed all pertinent labs, diagnostic studies, imaging, and have provided independent interpretation of the same.     Labs:     Recent Labs     05/11/22  0825 05/11/22  1747 05/12/22  0443 05/12/22  1315   WBC 3.4*  --  4.0  --    HGB 7.6*   < > 7.9* 7.9*   HCT 24.2*   < > 25.4* 24.4*   PLT 283  --  264  --     < > = values in this interval not displayed.       Recent Labs     05/10/22  1425 05/11/22  0825 05/12/22  0443   NA 132* 135* 134*   K 3.9 4.3 3.8   CL 97 99 97   CO2 '27 31 28   '$ BUN 20 28* 25*       No results for input(s): "ALT", "TP", "ALB", "GLOB", "GGT", "AML" in the last 72 hours.    Invalid input(s): "SGOT", "GPT", "AP", "TBIL", "TBILI", "AMYP", "LPSE", "HLPSE"    No results for input(s): "INR", "APTT" in the last 72 hours.    Invalid  input(s): "PTP"     No results for input(s): "TIBC", "FERR" in the last 72 hours.    Invalid input(s): "FE", "PSAT"     No results found for: "FOL", "RBCF"   No results for input(s): "PH", "PCO2", "PO2" in the last 72 hours.  No results for input(s): "CPK" in the last 72 hours.    Invalid input(s): "CPKMB", "CKNDX", "TROIQ"  No results found for: "CHOL", "[place]", "CHLST", "CHOLV", "HDL", "HDLC", "LDL", "LDLC", "TGLX", "TRIGL"  No results found for: "GLUCPOC"  '@LABUA'$ @    Notes reviewed from all clinical/nonclinical/nursing services involved in patient's clinical care. Care coordination discussions were held with appropriate clinical/nonclinical/ nursing providers based on care coordination needs.         Patients current active Medications were reviewed, considered, added and adjusted based on the clinical condition today.      Home Medications were reconciled to the best of my ability given all available resources at the time of admission. Route is PO if not otherwise noted.      Admission Status:[id number]:::1}      Medications Reviewed:     Current Facility-Administered Medications   Medication Dose Route Frequency    epoetin alfa-epbx (RETACRIT) injection 20,000 Units  20,000 Units SubCUTAneous Once per day on Mon Wed Fri    prochlorperazine (COMPAZINE) injection 10 mg  10 mg IntraVENous  Q6H PRN    HYDROmorphone HCl PF (DILAUDID) injection 1 mg  1 mg IntraVENous Q4H PRN    promethazine (PHENERGAN) tablet 25 mg  25 mg Oral Q6H PRN    sodium chloride flush 0.9 % injection 5-40 mL  5-40 mL IntraVENous 2 times per day    sodium chloride flush 0.9 % injection 5-40 mL  5-40 mL IntraVENous PRN    0.9 % sodium chloride infusion   IntraVENous PRN    ondansetron (ZOFRAN-ODT) disintegrating tablet 4 mg  4 mg Oral Q8H PRN    Or    ondansetron (ZOFRAN) injection 4 mg  4 mg IntraVENous Q6H PRN    polyethylene glycol (GLYCOLAX) packet 17 g  17 g Oral Daily PRN    amLODIPine (NORVASC) tablet 10 mg  10 mg Oral Daily    calcium  acetate (PHOSLO) tablet 667 mg  667 mg Oral TID WC     ______________________________________________________________________  EXPECTED LENGTH OF STAY: 9  ACTUAL LENGTH OF STAY:          3                 Tania Ade, MD

## 2022-05-12 NOTE — Progress Notes (Signed)
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     Interval History:  Seen and examined this AM. Denies CP or SOB. K stable no further Lokelma. No new issues or complaints.    Review of Systems: Pertinent items are noted in HPI.    Current Medications:   Current Facility-Administered Medications   Medication Dose Route Frequency    epoetin alfa-epbx (RETACRIT) injection 20,000 Units  20,000 Units SubCUTAneous Once per day on Mon Wed Fri    prochlorperazine (COMPAZINE) injection 10 mg  10 mg IntraVENous Q6H PRN    HYDROmorphone HCl PF (DILAUDID) injection 1 mg  1 mg IntraVENous Q4H PRN    promethazine (PHENERGAN) tablet 25 mg  25 mg Oral Q6H PRN    sodium chloride flush 0.9 % injection 5-40 mL  5-40 mL IntraVENous 2 times per day    sodium chloride flush 0.9 % injection 5-40 mL  5-40 mL IntraVENous PRN    0.9 % sodium chloride infusion   IntraVENous PRN    ondansetron (ZOFRAN-ODT) disintegrating tablet 4 mg  4 mg Oral Q8H PRN    Or    ondansetron (ZOFRAN) injection 4 mg  4 mg IntraVENous Q6H PRN    polyethylene glycol (GLYCOLAX) packet 17 g  17 g Oral Daily PRN    amLODIPine (NORVASC) tablet 10 mg  10 mg Oral Daily    calcium acetate (PHOSLO) tablet 667 mg  667 mg Oral TID WC      Allergies   Allergen Reactions    Ace Inhibitors Anaphylaxis     Pt states "all of these have closed my throat"     Acetaminophen Anaphylaxis     Pt states "all of these have closed my throat"    Codeine Anaphylaxis     Pt states "all of these have closed my throat"    Fentanyl Anaphylaxis    Hydrocodone Anaphylaxis     Pt states "all of these have closed my throat"    Nsaids Other (See Comments)     pcp states not to take due to kidneys     Oxycodone Anaphylaxis     Pt states "all of these have closed my throat"    Sulfa Antibiotics Anaphylaxis       Objective:  Vitals:    Vitals:    05/11/22 2024 05/12/22 0058 05/12/22 0239 05/12/22 0826   BP: 116/71  107/65 128/72   Pulse: 76  79 73   Resp:  '20 20 18   '$ Temp: 99 F (37.2 C)  98.4 F (36.9 C) 98.2 F (36.8 C)   TempSrc: Oral  Oral Oral  SpO2: 98%  100% 100%   Height:         Intake and Output:  No intake/output data recorded.  02/20 1901 - 02/22 0700  In: 900 [P.O.:400]  Out: 2300     Physical Examination:  General: NAD,Conversant   Neck:  Supple, no mass  Resp:  Lungs CTA B/L, no wheezing , normal respiratory effort  CV:  RRR, + murmur , trace B/L LE edema  GI:  Soft, NT, + BS, no HS megaly  Neurologic:  Non focal  Psych:             AAO x 3 appropriate affect   Skin:  No Rash  Dialysis Access : RUE AVF + bruit    '[]'$     High complexity decision making was performed  '[]'$     Patient is at high-risk of decompensation with multiple organ involvement    Lab Data Personally Reviewed: I have reviewed all the pertinent labs, microbiology data and radiology studies during assessment.    Labs:  Recent Labs     05/10/22  1425 05/11/22  0825 05/12/22  0443   NA 132* 135* 134*   K 3.9 4.3 3.8   CL 97 99 97   CO2 '27 31 28   '$ GLUCOSE 105* 81 77   BUN 20 28* 25*   CREATININE 5.62* 7.47* 5.60*   CALCIUM 8.6 8.7 9.0         Recent Labs     05/10/22  1425 05/11/22  0825 05/11/22  1747 05/12/22  0443   WBC 4.0 3.4*  --  4.0   RBC 2.59* 2.54*  --  2.66*   HGB 7.8* 7.6* 8.0* 7.9*   HCT 25.6* 24.2* 25.3* 25.4*   MCV 98.8 95.3  --  95.5   MCH 30.1 29.9  --  29.7   MCHC 30.5 31.4  --  31.1   RDW  16.1* 15.9*  --  15.9*   PLT 275 283  --  264   MPV 9.0 9.6  --  9.5       No results for input(s): "TP", "ALB", "GLOB", "AML" in the last 72 hours.    Invalid input(s): "SGOT", "GPT", "AP", "TBIL", "AMYP", "LPSE", "LAC"    No results for input(s): "INR", "APTT" in the last 72 hours.    Invalid input(s): "PTP"     No results for input(s): "CPK", "CKMB", "TROPONINI" in the last 72 hours.    Invalid input(s): "B-NP"  Invalid input(s): "PHI", "PCO2I", "PO2I", "FIO2I"     Ventilator:       Microbiology:  No results found for: "SDES"  No components found for: "CULT"      I have reviewed the flowsheets.  Chart and Pertinent Notes have been reviewed.   No change in PMH ,family and social history from Consult note.      Maurine Cane, APRN - NP  [facility]

## 2022-05-12 NOTE — Consults (Signed)
Vascular Surgery  --full note to follow; hx of central venous occlusions requiring prior stenting  --ok to hold anticoagulation for now and for procedures  --could restart when safe/feasible;

## 2022-05-13 LAB — CBC
Hematocrit: 25.3 % — ABNORMAL LOW (ref 35.0–47.0)
Hemoglobin: 8 g/dL — ABNORMAL LOW (ref 11.5–16.0)
MCH: 29.7 PG (ref 26.0–34.0)
MCHC: 31.6 g/dL (ref 30.0–36.5)
MCV: 94.1 FL (ref 80.0–99.0)
MPV: 9.4 FL (ref 8.9–12.9)
Nucleated RBCs: 0 PER 100 WBC
Platelets: 264 10*3/uL (ref 150–400)
RBC: 2.69 M/uL — ABNORMAL LOW (ref 3.80–5.20)
RDW: 15.8 % — ABNORMAL HIGH (ref 11.5–14.5)
WBC: 4.5 10*3/uL (ref 3.6–11.0)
nRBC: 0 10*3/uL (ref 0.00–0.01)

## 2022-05-13 LAB — BASIC METABOLIC PANEL
Anion Gap: 6 mmol/L (ref 5–15)
BUN: 39 MG/DL — ABNORMAL HIGH (ref 6–20)
Bun/Cre Ratio: 5 — ABNORMAL LOW (ref 12–20)
CO2: 28 mmol/L (ref 21–32)
Calcium: 9 MG/DL (ref 8.5–10.1)
Chloride: 95 mmol/L — ABNORMAL LOW (ref 97–108)
Creatinine: 8.08 MG/DL — ABNORMAL HIGH (ref 0.55–1.02)
Est, Glom Filt Rate: 6 mL/min/{1.73_m2} — ABNORMAL LOW (ref >60)
Glucose: 84 mg/dL (ref 65–100)
Potassium: 4.4 mmol/L (ref 3.5–5.1)
Sodium: 129 mmol/L — ABNORMAL LOW (ref 136–145)

## 2022-05-13 MED ORDER — HYDROMORPHONE HCL 2 MG PO TABS
2 | ORAL | Status: DC | PRN
Start: 2022-05-13 — End: 2022-05-18
  Administered 2022-05-14 – 2022-05-18 (×17): 2 mg via ORAL

## 2022-05-13 MED ORDER — APIXABAN 2.5 MG PO TABS
2.5 | Freq: Two times a day (BID) | ORAL | Status: DC
Start: 2022-05-13 — End: 2022-05-13

## 2022-05-13 MED FILL — RETACRIT 10000 UNIT/ML IJ SOLN: 10000 UNIT/ML | INTRAMUSCULAR | Qty: 2

## 2022-05-13 MED FILL — HYDROMORPHONE HCL 1 MG/ML IJ SOLN: 1 MG/ML | INTRAMUSCULAR | Qty: 1

## 2022-05-13 MED FILL — PROCHLORPERAZINE EDISYLATE 10 MG/2ML IJ SOLN: 10 MG/2ML | INTRAMUSCULAR | Qty: 2

## 2022-05-13 MED FILL — PROMETHAZINE HCL 25 MG PO TABS: 25 MG | ORAL | Qty: 1

## 2022-05-13 MED FILL — ONDANSETRON HCL 4 MG/2ML IJ SOLN: 4 MG/2ML | INTRAMUSCULAR | Qty: 2

## 2022-05-13 MED FILL — CALCIUM ACETATE (PHOS BINDER) 667 MG PO TABS: 667 MG | ORAL | Qty: 1

## 2022-05-13 NOTE — Consults (Signed)
[facility]  [address], [place] [address]  W: [phone]  F: [phone]    Reason for Visit:   Meredith Hawkins is a 37 y.o. female who is seen in consultation at the request of Dr. Dairl Ponder for evaluation of Thrombophilia.    History of Present Illness:   Patient is a 37 year old female with end-stage renal disease on dialysis, history of MI,SVC syndrome chronic anticoagulation with Eliquis, VSD, pancreatitis transferred from [facility] to [facility] on 05/07/2022 with abdominal pain nausea vomiting. She was found to have L renal hemorrhage.     Patient states her pain is better.  Overall a poor historian not very inclined to discuss management.  Hemoglobin is currently stable posttransfusion and anticoagulation is on hold.    Past Medical History:   Diagnosis Date    Asthma     Lupus ([place])     VSD (ventricular septal defect)       No past surgical history on file.   Social History     Tobacco Use    Smoking status: Never    Smokeless tobacco: Never   Substance Use Topics    Alcohol use: Never      No family history on file.  Current Facility-Administered Medications   Medication Dose Route Frequency    epoetin alfa-epbx (RETACRIT) injection 20,000 Units  20,000 Units SubCUTAneous Once per day on Mon Wed Fri    prochlorperazine (COMPAZINE) injection 10 mg  10 mg IntraVENous Q6H PRN    HYDROmorphone HCl PF (DILAUDID) injection 1 mg  1 mg IntraVENous Q4H PRN    promethazine (PHENERGAN) tablet 25 mg  25 mg Oral Q6H PRN    sodium chloride flush 0.9 % injection 5-40 mL  5-40 mL IntraVENous 2 times per day    sodium chloride flush 0.9 % injection 5-40 mL  5-40 mL IntraVENous PRN    0.9 % sodium chloride infusion   IntraVENous PRN    ondansetron (ZOFRAN-ODT) disintegrating tablet 4 mg  4 mg Oral Q8H PRN    Or    ondansetron (ZOFRAN) injection 4 mg  4 mg IntraVENous Q6H PRN    polyethylene glycol (GLYCOLAX) packet 17 g  17 g Oral Daily PRN     amLODIPine (NORVASC) tablet 10 mg  10 mg Oral Daily    calcium acetate (PHOSLO) tablet 667 mg  667 mg Oral TID WC      Allergies   Allergen Reactions    Ace Inhibitors Anaphylaxis     Pt states "all of these have closed my throat"    Acetaminophen Anaphylaxis     Pt states "all of these have closed my throat"    Codeine Anaphylaxis     Pt states "all of these have closed my throat"    Fentanyl Anaphylaxis    Hydrocodone Anaphylaxis     Pt states "all of these have closed my throat"    Nsaids Other (See Comments)     pcp states not to take due to kidneys     Oxycodone Anaphylaxis     Pt states "all of these have closed my throat"    Sulfa Antibiotics Anaphylaxis        Review of Systems: A complete review of systems was obtained, negative except as described above.    Physical Exam:   BP (!) 109/54   Pulse 78   Temp 98.1 F (36.7 C)   Resp 16  Ht 1.676 m ('5\' 6"'$ )   LMP 04/25/2022 (Exact Date)   SpO2 97%   BMI 27.40 kg/m   ECOG PS: 1  General: No distress, withdrawn and not willing to be examined  Eyes: PERRLA, anicteric sclerae  HENT: Atraumatic, OP clear  Neck: Supple  Skin: No rashes, ecchymoses, or petechiae. Normal temperature, turgor, and texture.      Results:     Lab Results   Component Value Date/Time    WBC 4.5 05/13/2022 03:05 AM    HGB 8.0 05/13/2022 03:05 AM    HCT 25.3 05/13/2022 03:05 AM    PLT 264 05/13/2022 03:05 AM    MCV 94.1 05/13/2022 03:05 AM     Lab Results   Component Value Date/Time    NA 129 05/13/2022 03:05 AM    K 4.4 05/13/2022 03:05 AM    CL 95 05/13/2022 03:05 AM    CO2 28 05/13/2022 03:05 AM    BUN 39 05/13/2022 03:05 AM    GFRAA 6 05/24/2019 09:30 AM     Lab Results   Component Value Date/Time    ALT <6 05/08/2022 02:01 AM    GLOB 4.2 05/08/2022 02:01 AM       Records reviewed and summarized above.  Pathology report(s) reviewed above.  Radiology report(s) reviewed above.    CT 04/2022  IMPRESSION:  Hypodense and enlarged left kidney with associated perinephric stranding  and  scattered smaller hyperdense foci within the renal parenchyma, spontaneous renal  hemorrhage is a differential consideration.     Numerous right renal cyst.       Assessment:   1) H/O SVC syndrome   Due to perm cath  She has been on long-term anticoagulation but now admitted with a spontaneous renal bleed.  At the moment the risk of anticoagulation outweighs benefit.  If hemoglobin remains stable over the next 48 hours then can resume anticoagulation at 2.5 mg of Eliquis twice a day.  Patient is a poor historian which limits recommendations regarding the need for continued anticoagulation.  Outside records are limited.    The diagnosis of antiphospholipid antibodies needs confirmation on 2 separate equations 12 weeks apart.  We can definitely send hypercoagulability workup right now but ideally she should resume anticoagulation on discharge, be set up with a hematologist in [place] where she lives to make determination of long-term anticoagulation    2) end-stage renal disease on hemodialysis    3) spontaneous renal hemorrhage    4) anemia  Secondary to bleeding and CKD    Plan:     Okay to hold anticoagulation for now, if hemoglobin remained stable for the next 2 days could consider starting 2.5 mg of Eliquis daily.  Extensive hypercoagulability workup has been sent by the primary team.  Will add beta-2 glycoprotein antibodies and anticardiolipin antibodies.  However the diagnosis of antiphospholipid antibody syndrome requires confirmation of these antibodies on 2 separate occasions 12 weeks apart.  So for most practical purposes if stable she would need to be discharged on anticoagulation    I appreciate the opportunity to participate in [name]'s care.    Signed By: Juliene Pina, MD

## 2022-05-13 NOTE — Plan of Care (Signed)
Problem: Discharge Planning  Goal: Discharge to home or other facility with appropriate resources  05/13/2022 2326 by Veron Senner, Debe Coder, RN  Outcome: Progressing  05/13/2022 1135 by Dow Adolph, RN  Outcome: Progressing     Problem: Pain  Goal: Verbalizes/displays adequate comfort level or baseline comfort level  05/13/2022 2326 by Eudelia Bunch, RN  Outcome: Progressing  05/13/2022 1135 by Dow Adolph, RN  Outcome: Progressing     Problem: Safety - Adult  Goal: Free from fall injury  05/13/2022 2326 by Eudelia Bunch, RN  Outcome: Progressing  05/13/2022 1135 by Dow Adolph, RN  Outcome: Progressing

## 2022-05-13 NOTE — Plan of Care (Signed)
Problem: Discharge Planning  Goal: Discharge to home or other facility with appropriate resources  05/13/2022 1135 by Dow Adolph, RN  Outcome: Progressing  05/13/2022 0204 by Eudelia Bunch, RN  Outcome: Progressing

## 2022-05-13 NOTE — Progress Notes (Signed)
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  - continue with ESA  - Stable from renal standpoint- will continue with MWF HD schedule while hospitalized.     Interval History:  Seen and examined on HD- Na 129 but not on FR- will add. Plan to remove 2-2.5 L as tolerated  .  Review of Systems: Pertinent items are noted in HPI.    Current Medications:   Current Facility-Administered Medications   Medication Dose Route Frequency    epoetin alfa-epbx (RETACRIT) injection 20,000 Units  20,000 Units SubCUTAneous Once per day on Mon Wed Fri    prochlorperazine (COMPAZINE) injection 10 mg  10 mg IntraVENous Q6H PRN    HYDROmorphone HCl PF (DILAUDID) injection 1 mg  1 mg IntraVENous Q4H PRN    promethazine (PHENERGAN) tablet 25 mg  25 mg Oral Q6H PRN    sodium chloride flush 0.9 % injection 5-40 mL  5-40 mL IntraVENous 2 times per day    sodium chloride flush 0.9 % injection 5-40 mL  5-40 mL IntraVENous PRN    0.9 % sodium chloride infusion   IntraVENous PRN    ondansetron (ZOFRAN-ODT) disintegrating tablet 4 mg  4 mg Oral Q8H PRN    Or    ondansetron (ZOFRAN) injection 4 mg  4 mg IntraVENous Q6H PRN    polyethylene glycol (GLYCOLAX) packet 17 g  17 g Oral Daily PRN    amLODIPine (NORVASC) tablet 10 mg  10 mg Oral Daily    calcium acetate (PHOSLO) tablet 667 mg  667 mg Oral TID WC      Allergies   Allergen Reactions    Ace Inhibitors Anaphylaxis      Pt states "all of these have closed my throat"    Acetaminophen Anaphylaxis     Pt states "all of these have closed my throat"    Codeine Anaphylaxis     Pt states "all of these have closed my throat"    Fentanyl Anaphylaxis    Hydrocodone Anaphylaxis     Pt states "all of these have closed my throat"    Nsaids Other (See Comments)     pcp states not to take due to kidneys     Oxycodone Anaphylaxis     Pt states "all of these have closed my throat"    Sulfa Antibiotics Anaphylaxis       Objective:  Vitals:    Vitals:    05/13/22 0830 05/13/22 0845 05/13/22 0900 05/13/22 0915   BP: 136/76 130/69 (!) 112/58 117/60   Pulse: 74 70 75 73   Resp:       Temp:       TempSrc:  SpO2:       Height:         Intake and Output:  No intake/output data recorded.  02/21 1901 - 02/23 0700  In: 250 [P.O.:250]  Out: 0     Physical Examination:  General: NAD,Conversant   Neck:  Supple, no mass  Resp:  no wheezing , normal respiratory effort  CV:  RRR, + murmur , trace B/L LE edema  GI:  Soft, NT, + BS, no HS megaly  Neurologic:  Non focal  Psych:             AAO x 3 appropriate affect   Skin:  No Rash  Dialysis Access : RUE AVF + bruit    '[]'$     High complexity decision making was performed  '[]'$     Patient is at high-risk of decompensation with multiple organ involvement    Lab Data Personally Reviewed: I have reviewed all the pertinent labs, microbiology data and radiology studies during assessment.    Labs:  Recent Labs     05/11/22  0825 05/12/22  0443 05/13/22  0305   NA 135* 134* 129*   K 4.3 3.8 4.4   CL 99 97 95*   CO2 '31 28 28   '$ GLUCOSE 81 77 84   BUN 28* 25* 39*   CREATININE 7.47* 5.60* 8.08*   CALCIUM 8.7 9.0 9.0         Recent Labs     05/11/22  0825 05/11/22  1747 05/12/22  0443 05/12/22  1315 05/13/22  0305   WBC 3.4*  --  4.0  --  4.5   RBC 2.54*  --  2.66*  --  2.69*   HGB 7.6*   < > 7.9* 7.9* 8.0*   HCT 24.2*   < > 25.4* 24.4* 25.3*   MCV 95.3  --  95.5  --  94.1   MCH 29.9  --  29.7  --  29.7   MCHC 31.4  --  31.1   --  31.6   RDW 15.9*  --  15.9*  --  15.8*   PLT 283  --  264  --  264   MPV 9.6  --  9.5  --  9.4    < > = values in this interval not displayed.       No results for input(s): "TP", "ALB", "GLOB", "AML" in the last 72 hours.    Invalid input(s): "SGOT", "GPT", "AP", "TBIL", "AMYP", "LPSE", "LAC"    No results for input(s): "INR", "APTT" in the last 72 hours.    Invalid input(s): "PTP"     No results for input(s): "CPK", "CKMB", "TROPONINI" in the last 72 hours.    Invalid input(s): "B-NP"  Invalid input(s): "PHI", "PCO2I", "PO2I", "FIO2I"     Ventilator:       Microbiology:  No results found for: "SDES"  No components found for: "CULT"      I have reviewed the flowsheets.  Chart and Pertinent Notes have been reviewed.   No change in PMH ,family and social history from Consult note.      Maurine Cane, APRN - NP  [facility]

## 2022-05-13 NOTE — Progress Notes (Signed)
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       Interval history / Subjective:   Patient is watching a movie on her laptop when I come into the room.  She continues to complain of back pain and persistent nausea.  She is not able to identify any alleviating or worsening factors for pain or nausea.  She has been IV Dilaudid and IV antiemetics.  She has been tolerating a p.o. diet and not vomiting.     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   Unsure if she has lupus coagulants.   -will consult vascular surgeon and hematologist to decide if she needs resume anticoagulants: ordered hyper coagulants work up   -if she definitely need life long AC, consider start heparin drip first and monitor cbc, if stable h&H in 2 days then consider change to [facility].     Left renal hemorrhage:  -Urology consulted;  -Recommend IR embolization if hematuria is noted;   -Anticoagulation held on admission;  -H&H has been stable for the past 3 days;  -Hematology consulted 2/23: continue to hold anticoagulation, restart Eliquis at 2.5 mg twice daily in 2 days if H&H remains stable;    Hyponatremia:   -Management  with dialysis;    Acute blood loss anemia, due to left renal hemorrhage;  Anemia of chronic disease:  -Procrit per nephrology;  -H&H stable;   -Transfuse as needed for hemoglobin <7;       Code status: full  Prophylaxis: SCDs  Care Plan discussed with: pt rn cm  Anticipated Disposition:TBD, need to decide if should resume anticoagulation at some point  Inpatient  Central Line:                Social Determinants of Health     Tobacco Use: Low Risk  (05/07/2022)    Patient History     Smoking Tobacco Use: Never     Smokeless Tobacco Use: Never     Passive Exposure: Not on file   Alcohol Use: Not  At Risk (05/07/2022)    AUDIT-C     Frequency of Alcohol Consumption: Never     Average Number of Drinks: Patient does not drink     Frequency of Binge Drinking: Never   Emergency planning/management officer Strain: Not on file   Food Insecurity: Not on file   Transportation Needs: Not on file   Physical Activity: Not on file   Stress: Not on file   Social Connections: Not on file   Intimate Partner Violence: Not on file   Depression: Not on file   Housing Stability: Not on file   Interpersonal Safety: Not on file   Utilities: Not on file       Review of Systems:   A comprehensive review of systems was negative.       Vital Signs:    Last 24hrs VS reviewed since prior progress note. Most recent are:  Vitals:    05/13/22 1200   BP: 116/64   Pulse: 69   Resp:    Temp:    SpO2:          Intake/Output Summary (Last 24 hours) at 05/13/2022 1332  Last data filed at 05/12/2022 2314  Gross per 24 hour   Intake 250 ml   Output 0 ml   Net 250 ml          Physical Examination:     I had a face to face encounter with this patient and independently examined them on 05/13/2022 as outlined below:          NAD  RR +systolic murmur  Normal work of breathing CTA b/l  L flank pain  Neuro grossly non-focal  Trace edema    Data Review:    Review and/or order of clinical lab test  Review and/or order of tests in the radiology section of CPT  Review and/or order of tests  in the medicine section of CPT      I have personally and independently reviewed all pertinent labs, diagnostic studies, imaging, and have provided independent interpretation of the same.     Labs:     Recent Labs     05/12/22  0443 05/12/22  1315 05/13/22  0305   WBC 4.0  --  4.5   HGB 7.9* 7.9* 8.0*   HCT 25.4* 24.4* 25.3*   PLT 264  --  264       Recent Labs     05/11/22  0825 05/12/22  0443 05/13/22  0305   NA 135* 134* 129*   K 4.3 3.8 4.4   CL 99 97 95*   CO2 '31 28 28   '$ BUN 28* 25* 39*       No results for input(s): "ALT", "TP", "ALB", "GLOB", "GGT", "AML" in the last 72 hours.    Invalid input(s): "SGOT", "GPT", "AP", "TBIL", "TBILI", "AMYP", "LPSE", "HLPSE"    No results for input(s): "INR", "APTT" in the last 72 hours.    Invalid input(s): "PTP"     No results for input(s): "TIBC", "FERR" in the last 72 hours.    Invalid input(s): "FE", "PSAT"     No results found for: "FOL", "RBCF"   No results for input(s): "PH", "PCO2", "PO2" in the last 72 hours.  No results for input(s): "CPK" in the last 72 hours.    Invalid input(s): "CPKMB", "CKNDX", "TROIQ"  No results found for: "CHOL", "[place]", "CHLST", "CHOLV", "HDL", "HDLC", "LDL", "LDLC", "TGLX", "TRIGL"  No results found for: "GLUCPOC"  '@LABUA'$ @  Notes reviewed from all clinical/nonclinical/nursing services involved in patient's clinical care. Care coordination discussions were held with appropriate clinical/nonclinical/ nursing providers based on care coordination needs.         Patients current active Medications were reviewed, considered, added and adjusted based on the clinical condition today.      Home Medications were reconciled to the best of my ability given all available resources at the time of admission. Route is PO if not otherwise noted.      Admission Status:[id number]:::1}      Medications Reviewed:     Current Facility-Administered Medications   Medication Dose Route Frequency    epoetin alfa-epbx (RETACRIT) injection 20,000 Units  20,000  Units SubCUTAneous Once per day on Mon Wed Fri    prochlorperazine (COMPAZINE) injection 10 mg  10 mg IntraVENous Q6H PRN    HYDROmorphone HCl PF (DILAUDID) injection 1 mg  1 mg IntraVENous Q4H PRN    promethazine (PHENERGAN) tablet 25 mg  25 mg Oral Q6H PRN    sodium chloride flush 0.9 % injection 5-40 mL  5-40 mL IntraVENous 2 times per day    sodium chloride flush 0.9 % injection 5-40 mL  5-40 mL IntraVENous PRN    0.9 % sodium chloride infusion   IntraVENous PRN    ondansetron (ZOFRAN-ODT) disintegrating tablet 4 mg  4 mg Oral Q8H PRN    Or    ondansetron (ZOFRAN) injection 4 mg  4 mg IntraVENous Q6H PRN    polyethylene glycol (GLYCOLAX) packet 17 g  17 g Oral Daily PRN    amLODIPine (NORVASC) tablet 10 mg  10 mg Oral Daily    calcium acetate (PHOSLO) tablet 667 mg  667 mg Oral TID WC     ______________________________________________________________________  EXPECTED LENGTH OF STAY: 9  ACTUAL LENGTH OF STAY:          4                 Haskell Flirt, MD

## 2022-05-13 NOTE — Other (Signed)
Primary RN SBAR: Meredith Bunch, RN  Patient Education: Procedural, access care  Hospital associated wait time; reason: none  Hepatitis B Surface Ag   Date/Time Value Ref Range Status   05/07/2022 09:35 PM <0.10 Index Final     Hep B S Ab   Date/Time Value Ref Range Status   05/07/2022 09:35 PM 277.97 mIU/mL Final      PRE HD TREATMENT   05/13/22 0815   Vital Signs   BP 120/66   Temp 98.1 F (36.7 C)   Pulse 76   Respirations 16   SpO2 97 %   Pain Assessment   Pain Assessment 0-10   Pain Level 7   Pain Location Flank   Pain Orientation Left   Patient's Stated Pain Goal 0 - No pain   Treatment   Time On 0823   Treatment Goal 2000 mL, 3.5 hours   Observations & Evaluations   Level of Consciousness 0   Oriented X 4   Heart Rhythm Regular   Respiratory Quality/Effort Unlabored   O2 Device None (Room air)   Bilateral Breath Sounds Clear   Skin Condition/Temp Warm   Abdomen Inspection Rounded   RLE Edema Trace   LLE Edema Trace   Technical Checks   Dialysis Machine No. [id number]   RO Machine Number [id number]   Dialyzer Lot No. [id number]   Tubing Lot Number [id number]   All Connections Secure Yes   NS Bag Yes   Saline Line Double Clamped Yes   Dialyzer Revaclear 300   Prime Volume (mL) 200 mL   ICEBOAT I;C;E;B;O;A;T   RO Machine Log Sheet Completed Yes   Machine Alarm Self Test Completed;Passed   Materials engineer Function;pH Reading   Animal nutritionist Conductivity 13.6   Manual Conductivity 13.4   Manual Ph 7.4   Bleach Test (Neg) Yes   Bath Temperature 98.6 F (37 C)   Dialysis Bath   K+ (Potassium) 2   Ca+ (Calcium) 2.5   Na+ (Sodium) 138   HCO3 (Bicarb) 38   Bicarbonate Concentrate Lot No. [id number]   Acid Concentrate Lot No. [id number]   Treatment Initiation   Dialyze Hours 3.5   Treatment  Initiation Universal Precautions maintained;Lines secured to patient;Connections secured;Prime given;Venous Parameters set;Arterial Parameters set;Chiropodist engaged;[place]  Device;Dialysate;Saline line double clamped;Dialyzer;REV-300   Hemodialysis Fistula/Graft Arteriovenous fistula Right Arm   Placement Date/Time: 05/07/22 0820   Present on Admission/Arrival: Yes  Access Type: Arteriovenous fistula  Orientation: Right  Access Location: Arm   Site Assessment Clean, dry & intact   Thrill Present   Bruit Present   Status Accessed   Venous Needle Size 15 G   Arterial Needle Size 15 G   Accessed By: K. Regnia Mathwig, RN   Access Attempts  1   Access Interventions Chlorhexidine;Aseptic Technique;Needles taped to patient     POST HD TREATMENT   05/13/22 1200   Vital Signs   BP 116/64   Pulse 69   Respirations 18   Pain Assessment   Pain Assessment 0-10   Pain Level 7   Pain Location Abdomen;Flank   Pain Descriptors Aching   Patient's Stated Pain Goal 0 - No pain   Post-Hemodialysis Assessment   Post-Treatment Procedures Blood returned;Access bleeding time < 10 minutes   Herbalist   Rinseback Volume (ml) 300 ml   Blood Volume Processed (Liters) 77.3 L   Dialyzer Clearance Moderately streaked  Duration of Treatment (minutes) 210 minutes   Heparin Amount Administered During Treatment (mL) 0 mL   Hemodialysis Intake (ml) 500 ml   Hemodialysis Output (ml) 2500 ml   NET Removed (ml) 2000   Tolerated Treatment Good   Patient Response to Treatment Tolerated well   Bilateral Breath Sounds Clear   RLE Edema Trace   LLE Edema Trace   Time Off 1153   Patient Disposition   (Remain in room 535)   Observations & Evaluations   Level of Consciousness 0   Oriented X 4   Heart Rhythm Regular   Respiratory Quality/Effort Unlabored   O2 Device None (Room air)   Skin Condition/Temp Warm       Primary RN SBAR: Meredith Like, RN  Comments: HD completed and tolerated well. No issues intra HD. All possible blood returned. Needles removed and pressure held until hemostasis achieved. New dressing applied. AV access with good thrill and bruit. Pt remain alert and oriented. NO  SOB. VSS. Bed lowered and locked. Side rails up x 2. Call bell in reach. Report given to Primary RN at bedside.

## 2022-05-13 NOTE — Plan of Care (Signed)
Problem: Discharge Planning  Goal: Discharge to home or other facility with appropriate resources  Outcome: Progressing     Problem: Pain  Goal: Verbalizes/displays adequate comfort level or baseline comfort level  Outcome: Progressing     Problem: Safety - Adult  Goal: Free from fall injury  Outcome: Progressing

## 2022-05-14 LAB — ANTITHROMBIN PANEL
Anti-Throm Ag: 116 % (ref 72–124)
Antithrombin Activity: 96 % (ref 75–135)

## 2022-05-14 LAB — PROTEIN C ACTIVITY: Protein C-Functional: 82 % (ref 73–180)

## 2022-05-14 LAB — PROTEIN S ACTIVITY: Protein S, Functional: 77 % (ref 63–140)

## 2022-05-14 MED FILL — HYDROMORPHONE HCL 2 MG PO TABS: 2 MG | ORAL | Qty: 1

## 2022-05-14 MED FILL — CALCIUM ACETATE (PHOS BINDER) 667 MG PO TABS: 667 MG | ORAL | Qty: 1

## 2022-05-14 MED FILL — AMLODIPINE BESYLATE 5 MG PO TABS: 5 MG | ORAL | Qty: 2

## 2022-05-14 MED FILL — ONDANSETRON HCL 4 MG/2ML IJ SOLN: 4 MG/2ML | INTRAMUSCULAR | Qty: 2

## 2022-05-14 MED FILL — PROMETHAZINE HCL 25 MG PO TABS: 25 MG | ORAL | Qty: 1

## 2022-05-14 NOTE — Progress Notes (Signed)
Patient requested one time dose of IV pain medication. This RN informed IV pain medication was not available. Patient requested that I message provider. This RN messaged on call provider. This RN offered to patient alternative pain management options but patient denied says, "they don't help." On call provider responded to try alternatives for pain management, IV pain medication will not be ordered. This RN educated patient on alternatives for pain management and informed patient IV medication will not be administered.    2240  Patient requested increase to pain medication. Request denied by on call provider. This RN educated patient on alternatives for pain management. Patient refused alternative options.

## 2022-05-14 NOTE — Progress Notes (Signed)
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       Interval history / Subjective:   Patient was seen and examined this morning.   "I don't like it that you changed my medications from IV to PO without telling me".   I explained that medications get changed from IV to PO formulation after a couple of days when patient is able to take medications by mouth.  Her Dilaudid was changed yesterday from 1 mg IV Q4h prn to 2 mg PO Q4h prn for severe pain.   Patient is requesting a new doctor.      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   Unsure if she has lupus coagulants.   -Vascular surgery consulted;   -Hematology consulted: see below;    Left renal hemorrhage:  -Urology consulted;  -Recommend IR embolization if hematuria is noted;   -Anticoagulation held on admission;  -H&H has been stable for the past 3 days;  -Hematology consulted 2/23: continue to hold anticoagulation, restart Eliquis at 2.5 mg twice daily in 2 days if H&H remains stable;  -plan to restart Eliquis on 2/25-2/26; continue to monitor H&H closely;     Hyponatremia:   -Management with dialysis;    Acute blood loss anemia, due  to left renal hemorrhage;  Anemia of chronic disease:  -Procrit per nephrology;  -H&H stable;   -Transfuse as needed for hemoglobin <7;       Code status: full  Prophylaxis: SCDs  Care Plan discussed with: patient, RN;   Anticipated Disposition:TBD, anticipate discharge to home in 2-3 days;    Inpatient  Central Line:                Social Determinants of Health     Tobacco Use: Low Risk  (05/07/2022)    Patient History     Smoking Tobacco Use: Never     Smokeless Tobacco Use: Never     Passive Exposure: Not on file   Alcohol Use: Not At Risk (05/07/2022)  AUDIT-C     Frequency of Alcohol Consumption: Never     Average Number of Drinks: Patient does not drink     Frequency of Binge Drinking: Never   Emergency planning/management officer Strain: Not on file   Food Insecurity: Not on file   Transportation Needs: Not on file   Physical Activity: Not on file   Stress: Not on file   Social Connections: Not on file   Intimate Partner Violence: Not on file   Depression: Not on file   Housing Stability: Not on file   Interpersonal Safety: Not on file   Utilities: Not on file       Review of Systems:   A comprehensive review of systems was negative.       Vital Signs:    Last 24hrs VS reviewed since prior progress note. Most recent are:  Vitals:    05/14/22 0835   BP: 125/79   Pulse: 83   Resp: 12   Temp: 98.2 F (36.8 C)   SpO2: 99%         Intake/Output Summary (Last 24 hours) at 05/14/2022 1050  Last data filed at 05/13/2022 2040  Gross per 24 hour   Intake 700 ml   Output 2500 ml   Net -1800 ml          Physical Examination:     I had a face to face encounter with this patient and independently examined them on 05/14/2022 as outlined below:          NAD  RR +systolic murmur  Normal work of breathing CTA b/l  L flank pain  Neuro grossly non-focal  Trace edema    Data Review:    Review and/or order of clinical lab test  Review and/or order of tests in the radiology section of CPT  Review and/or order of tests in the medicine section of  CPT      I have personally and independently reviewed all pertinent labs, diagnostic studies, imaging, and have provided independent interpretation of the same.     Labs:     Recent Labs     05/12/22  0443 05/12/22  1315 05/13/22  0305   WBC 4.0  --  4.5   HGB 7.9* 7.9* 8.0*   HCT 25.4* 24.4* 25.3*   PLT 264  --  264       Recent Labs     05/12/22  0443 05/13/22  0305   NA 134* 129*   K 3.8 4.4   CL 97 95*   CO2 28 28   BUN 25* 39*       No results for input(s): "ALT", "TP", "ALB", "GLOB", "GGT", "AML" in the last 72 hours.    Invalid input(s): "SGOT", "GPT", "AP", "TBIL", "TBILI", "AMYP", "LPSE", "HLPSE"    No results for input(s): "INR", "APTT" in the last 72 hours.    Invalid input(s): "PTP"     No results for input(s): "TIBC", "FERR" in the last 72 hours.    Invalid input(s): "FE", "PSAT"     No results found for: "FOL", "RBCF"   No results for input(s): "PH", "PCO2", "PO2" in the last 72 hours.  No results for input(s): "CPK" in the last 72 hours.    Invalid input(s): "CPKMB", "CKNDX", "TROIQ"  No results found for: "CHOL", "[place]", "CHLST", "CHOLV", "HDL", "HDLC", "LDL", "LDLC", "TGLX", "TRIGL"  No results found for: "GLUCPOC"  @LABUA$ @    Notes reviewed from all clinical/nonclinical/nursing services involved in patient's  clinical care. Care coordination discussions were held with appropriate clinical/nonclinical/ nursing providers based on care coordination needs.         Patients current active Medications were reviewed, considered, added and adjusted based on the clinical condition today.      Home Medications were reconciled to the best of my ability given all available resources at the time of admission. Route is PO if not otherwise noted.      Admission Status:[id number]:::1}      Medications Reviewed:     Current Facility-Administered Medications   Medication Dose Route Frequency    HYDROmorphone (DILAUDID) tablet 2 mg  2 mg Oral Q4H PRN    epoetin alfa-epbx (RETACRIT) injection 20,000 Units  20,000 Units  SubCUTAneous Once per day on Mon Wed Fri    prochlorperazine (COMPAZINE) injection 10 mg  10 mg IntraVENous Q6H PRN    promethazine (PHENERGAN) tablet 25 mg  25 mg Oral Q6H PRN    sodium chloride flush 0.9 % injection 5-40 mL  5-40 mL IntraVENous 2 times per day    sodium chloride flush 0.9 % injection 5-40 mL  5-40 mL IntraVENous PRN    0.9 % sodium chloride infusion   IntraVENous PRN    ondansetron (ZOFRAN-ODT) disintegrating tablet 4 mg  4 mg Oral Q8H PRN    Or    ondansetron (ZOFRAN) injection 4 mg  4 mg IntraVENous Q6H PRN    polyethylene glycol (GLYCOLAX) packet 17 g  17 g Oral Daily PRN    amLODIPine (NORVASC) tablet 10 mg  10 mg Oral Daily    calcium acetate (PHOSLO) tablet 667 mg  667 mg Oral TID WC     ______________________________________________________________________  EXPECTED LENGTH OF STAY: 9  ACTUAL LENGTH OF STAY:          5                 Haskell Flirt, MD

## 2022-05-14 NOTE — Plan of Care (Signed)
Problem: Pain  Goal: Verbalizes/displays adequate comfort level or baseline comfort level  05/15/2022 0142 by Delsa Grana, RN  Outcome: Progressing  05/14/2022 1155 by Dow Adolph, RN  Outcome: Progressing  05/14/2022 1154 by Dow Adolph, RN  Outcome: Progressing     Problem: Safety - Adult  Goal: Free from fall injury  05/15/2022 0142 by Delsa Grana, RN  Outcome: Progressing  05/14/2022 1155 by Dow Adolph, RN  Outcome: Progressing  05/14/2022 1154 by Dow Adolph, RN  Outcome: Progressing

## 2022-05-14 NOTE — Plan of Care (Signed)
Problem: Discharge Planning  Goal: Discharge to home or other facility with appropriate resources  05/14/2022 1154 by Dow Adolph, RN  Outcome: Progressing  05/13/2022 2326 by Eudelia Bunch, RN  Outcome: Progressing

## 2022-05-15 MED ORDER — HYDROMORPHONE HCL PF 1 MG/ML IJ SOLN
1 | Freq: Once | INTRAMUSCULAR | Status: AC
Start: 2022-05-15 — End: 2022-05-15
  Administered 2022-05-15: 20:00:00 0.5 mg via INTRAVENOUS

## 2022-05-15 MED FILL — HYDROMORPHONE HCL 2 MG PO TABS: 2 MG | ORAL | Qty: 1

## 2022-05-15 MED FILL — CALCIUM ACETATE (PHOS BINDER) 667 MG PO TABS: 667 MG | ORAL | Qty: 1

## 2022-05-15 MED FILL — ONDANSETRON HCL 4 MG/2ML IJ SOLN: 4 MG/2ML | INTRAMUSCULAR | Qty: 2

## 2022-05-15 MED FILL — AMLODIPINE BESYLATE 5 MG PO TABS: 5 MG | ORAL | Qty: 2

## 2022-05-15 MED FILL — PROMETHAZINE HCL 25 MG PO TABS: 25 MG | ORAL | Qty: 1

## 2022-05-15 MED FILL — HYDROMORPHONE HCL 1 MG/ML IJ SOLN: 1 MG/ML | INTRAMUSCULAR | Qty: 1

## 2022-05-15 MED FILL — ONDANSETRON 4 MG PO TBDP: 4 MG | ORAL | Qty: 1

## 2022-05-15 NOTE — Progress Notes (Cosign Needed)
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    Interval history / Subjective:   HD tomorrow  Pt declined lab work today, unable to obtain Hgb  Noted requesting IV pain medications overnight, trying to avoid IV narcotics  If hgb stable tomorrow, restart Eliquis and monitor for any worsening of left renal hemorrhage. Pt is verbally clear on plan.  Discussed with nurinsg     Assessment & Plan:     Left renal hemorrhage  -last dose of Eliquis 2/17, OAC held on admission  -evaluated by Urology, recommend IR embolization if hematuria is noted  -2/22 evaluated by vascular: hold OAC and restart when safe/feasible  -2/23 Hematology consulted & recommend hold OAC for 48 hrs and restarting Eliquis at 2.5 mg BID if H&H remains stable    Hx of SVC Syndrome  SLE (per pt)  -diagnosed in 2019-2020, related to her previous perm cath  -Unsure if she has lupus coagulants, pt is a poor historian  -on long term oac w/ Eliquis  -lives in [place], outside records limited. Pt's surgical team manages SVC per her report  -per hematology, the diagnosis of antiphospholipid antibodies needs confirmation on 2 separate equations 12 weeks apart.    -pt will need a hematologist in [place] where she resides to discuss determination of long term anticoagulation, this has been discussed with pt  -hypercoaguable work up sent    Hyponatremia  -Management with dialysis    Acute blood loss anemia, due to left renal hemorrhage  Anemia of chronic disease  -Procrit per nephrology  -H&H stable  -Transfuse as needed for  hemoglobin <7  -c/w epoetin    ESRD   - per nephrology  - m, w, f    HTN - c/w norvasc    Asthma - stable       Code status: full  Prophylaxis: SCDs  Care Plan discussed with: Patient, nursing, attending  Anticipated Disposition: TBD, patient lives in [place] and follows up at [facility] Line:              Social Determinants of Health     Tobacco Use: Low Risk  (05/07/2022)    Patient History     Smoking Tobacco Use: Never     Smokeless Tobacco Use: Never     Passive Exposure: Not on file   Alcohol Use: Not At Risk (05/07/2022)    AUDIT-C     Frequency of Alcohol Consumption: Never     Average Number of Drinks:  Patient does not drink     Frequency of Binge Drinking: Never   Physicist, medical Strain: Not on file   Food Insecurity: Not on file   Transportation Needs: Not on file   Physical Activity: Not on file   Stress: Not on file   Social Connections: Not on file   Intimate Partner Violence: Not on file   Depression: Not on file   Housing Stability: Not on file   Interpersonal Safety: Not on file   Utilities: Not on file       Review of Systems:   A comprehensive review of systems was negative.       Vital Signs:    Last 24hrs VS reviewed since prior progress note. Most recent are:  Vitals:    05/15/22 0747   BP: 117/80   Pulse: 68   Resp: 14   Temp: 97.5 F (36.4 C)   SpO2: 98%       No intake or output data in the 24 hours ending 05/15/22 0801       Physical Examination:     I had a face to face encounter with this patient and independently examined them on 05/15/2022 as outlined below:    General: Patient in no acute respiratory distress.  HEENT: Normocephalic, atraumatic, no otorrhea, no rhinorrhea, nares patent, PEERL, EOMI, moist mucus membranes.   Neck: Supple, no JVD, Trachea midline.  Hypervascular nonpulsatile chronic appearance across anterior neck and supraclavicular region  Respiratory/ Chest: Clear to auscultation bilaterally   CVS: Regular rate.  Regular rhythm.  Normal S1-S2.   Pansystolic blowing murmur  GI/ Abd: Soft.  Obese.  Nondistended.  Generalized tenderness to light palpation.  Voluntary guarding.  No rebound or rigidity.  Normoactive bowel sounds.   Musculoskeletal/Ext:   Nonpitting edema about lower extremities. Chronic joint deformity with large left foot bunion.  Neuro: Moves all extremities x 4.  Generalized weakness strength 4/5 bilateral upper/lower extremities.  No slurred speech.  No facial droop.  Sensation grossly intact.    Vascular/ Pulses: 2+ radial, 1+ dorsalis pedis pulse bilateral. Right arm AV fistula with palpable thrill.  Integument/ Skin: Warm, dry, without rashes or lesions           Data Review:    Review and/or order of clinical lab test  Review and/or order of tests in the radiology section of CPT  Review and/or order of tests in the medicine section of CPT      I have personally and independently reviewed all pertinent labs, diagnostic studies, imaging, and have provided independent interpretation of the same.     Labs:     Recent Labs     05/12/22  1315 05/13/22  0305   WBC  --  4.5   HGB 7.9* 8.0*   HCT 24.4* 25.3*   PLT  --  264       Recent Labs     05/13/22  0305   NA 129*   K 4.4   CL 95*   CO2 28   BUN 39*       No results for input(s): "ALT", "TP", "ALB", "GLOB", "GGT", "AML" in the last 72 hours.    Invalid input(s): "SGOT", "GPT", "AP", "TBIL", "TBILI", "AMYP", "LPSE", "HLPSE"    No results for input(s): "INR", "APTT" in the last 72 hours.    Invalid input(s): "PTP"     No results for input(s): "TIBC", "FERR" in the last 72 hours.    Invalid input(s): "FE", "  PSAT"     No results found for: "FOL", "RBCF"   No results for input(s): "PH", "PCO2", "PO2" in the last 72 hours.  No results for input(s): "CPK" in the last 72 hours.    Invalid input(s): "CPKMB", "CKNDX", "TROIQ"  No results found for: "CHOL", "CHOLX", "CHLST", "CHOLV", "HDL", "HDLC", "LDL", "LDLC", "TGLX", "TRIGL"  No results found for: "GLUCPOC"  @LABUA @    Notes reviewed from all  clinical/nonclinical/nursing services involved in patient's clinical care. Care coordination discussions were held with appropriate clinical/nonclinical/ nursing providers based on care coordination needs.         Patients current active Medications were reviewed, considered, added and adjusted based on the clinical condition today.      Home Medications were reconciled to the best of my ability given all available resources at the time of admission. Route is PO if not otherwise noted.      Admission Status:[id number]:::1}      Medications Reviewed:     Current Facility-Administered Medications   Medication Dose Route Frequency    HYDROmorphone (DILAUDID) tablet 2 mg  2 mg Oral Q4H PRN    epoetin alfa-epbx (RETACRIT) injection 20,000 Units  20,000 Units SubCUTAneous Once per day on Mon Wed Fri    prochlorperazine (COMPAZINE) injection 10 mg  10 mg IntraVENous Q6H PRN    promethazine (PHENERGAN) tablet 25 mg  25 mg Oral Q6H PRN    sodium chloride flush 0.9 % injection 5-40 mL  5-40 mL IntraVENous 2 times per day    sodium chloride flush 0.9 % injection 5-40 mL  5-40 mL IntraVENous PRN    0.9 % sodium chloride infusion   IntraVENous PRN    ondansetron (ZOFRAN-ODT) disintegrating tablet 4 mg  4 mg Oral Q8H PRN    Or    ondansetron (ZOFRAN) injection 4 mg  4 mg IntraVENous Q6H PRN    polyethylene glycol (GLYCOLAX) packet 17 g  17 g Oral Daily PRN    amLODIPine (NORVASC) tablet 10 mg  10 mg Oral Daily    calcium acetate (PHOSLO) tablet 667 mg  667 mg Oral TID WC     ______________________________________________________________________  EXPECTED LENGTH OF STAY: 9  ACTUAL LENGTH OF STAY:          6                 Reuel Lamadrid, APRN - NP

## 2022-05-15 NOTE — Progress Notes (Signed)
Patient refused to have blood drawn for lab work. This RN educated patient on reason for blood work in care. Patient refused, said will have drawn on day of dialysis.

## 2022-05-15 NOTE — Plan of Care (Signed)
Problem: Pain  Goal: Verbalizes/displays adequate comfort level or baseline comfort level  Outcome: Progressing     Problem: Safety - Adult  Goal: Free from fall injury  Outcome: Progressing

## 2022-05-15 NOTE — Plan of Care (Signed)
Problem: Discharge Planning  Goal: Discharge to home or other facility with appropriate resources  Outcome: Progressing

## 2022-05-16 LAB — HEMOGLOBIN AND HEMATOCRIT
Hematocrit: 24.3 % — ABNORMAL LOW (ref 35.0–47.0)
Hemoglobin: 8 g/dL — ABNORMAL LOW (ref 11.5–16.0)

## 2022-05-16 LAB — RENAL FUNCTION PANEL
Albumin: 2.9 g/dL — ABNORMAL LOW (ref 3.5–5.0)
Anion Gap: 7 mmol/L (ref 5–15)
BUN: 49 MG/DL — ABNORMAL HIGH (ref 6–20)
Bun/Cre Ratio: 5 — ABNORMAL LOW (ref 12–20)
CO2: 25 mmol/L (ref 21–32)
Calcium: 8.1 MG/DL — ABNORMAL LOW (ref 8.5–10.1)
Chloride: 95 mmol/L — ABNORMAL LOW (ref 97–108)
Creatinine: 10.3 MG/DL — ABNORMAL HIGH (ref 0.55–1.02)
Est, Glom Filt Rate: 5 mL/min/{1.73_m2} — ABNORMAL LOW (ref >60)
Glucose: 82 mg/dL (ref 65–100)
Phosphorus: 5.4 MG/DL — ABNORMAL HIGH (ref 2.6–4.7)
Potassium: 5.1 mmol/L (ref 3.5–5.1)
Sodium: 127 mmol/L — ABNORMAL LOW (ref 136–145)

## 2022-05-16 MED ORDER — APIXABAN 2.5 MG PO TABS
2.5 MG | Freq: Every day | ORAL | Status: AC
Start: 2022-05-16 — End: 2022-05-18
  Administered 2022-05-17 – 2022-05-18 (×3): 2.5 mg via ORAL

## 2022-05-16 MED ORDER — ALBUMIN HUMAN 25 % IV SOLN
25 | Freq: Once | INTRAVENOUS | Status: DC
Start: 2022-05-16 — End: 2022-05-16

## 2022-05-16 MED ORDER — HYDROMORPHONE HCL PF 1 MG/ML IJ SOLN
1 | Freq: Once | INTRAMUSCULAR | Status: AC
Start: 2022-05-16 — End: 2022-05-16
  Administered 2022-05-16: 22:00:00 0.25 mg via INTRAVENOUS

## 2022-05-16 MED ORDER — APIXABAN 2.5 MG PO TABS
2.5 | Freq: Two times a day (BID) | ORAL | Status: DC
Start: 2022-05-16 — End: 2022-05-16

## 2022-05-16 MED ORDER — ALBUMIN HUMAN 25 % IV SOLN
25 % | INTRAVENOUS | Status: AC | PRN
Start: 2022-05-16 — End: 2022-05-18

## 2022-05-16 MED FILL — HYDROMORPHONE HCL 2 MG PO TABS: 2 MG | ORAL | Qty: 1

## 2022-05-16 MED FILL — CALCIUM ACETATE (PHOS BINDER) 667 MG PO TABS: 667 MG | ORAL | Qty: 1

## 2022-05-16 MED FILL — ONDANSETRON HCL 4 MG/2ML IJ SOLN: 4 MG/2ML | INTRAMUSCULAR | Qty: 2

## 2022-05-16 MED FILL — HYDROMORPHONE HCL 1 MG/ML IJ SOLN: 1 MG/ML | INTRAMUSCULAR | Qty: 1

## 2022-05-16 MED FILL — RETACRIT 10000 UNIT/ML IJ SOLN: 10000 UNIT/ML | INTRAMUSCULAR | Qty: 2

## 2022-05-16 MED FILL — PROMETHAZINE HCL 25 MG PO TABS: 25 MG | ORAL | Qty: 1

## 2022-05-16 NOTE — Progress Notes (Signed)
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    Interval history / Subjective:   Follow up left renal hemorrhage  Asking for IV pain meds   Getting dialysis  Assessment & Plan:     Left renal hemorrhage  -last dose of Eliquis 2/17, [place] held on admission  -evaluated by Urology, recommend IR embolization if hematuria is noted  -2/22 evaluated by vascular: hold [place] and restart when safe/feasible  -2/23 Hematology consulted & recommend hold [facility] for 48 hrs and restarting Eliquis at 2.5 mg BID if H&H remains stable  -Started on Eliquis 2/26    Hx of SVC Syndrome  SLE (per pt)  -diagnosed in 2019-2020, related to her previous perm cath  -Unsure if she has lupus coagulants, pt is a poor historian  -on long term oac w/ Eliquis  -lives in [place], outside records limited. Pt's surgical team manages SVC per her report  -per hematology, the diagnosis of antiphospholipid antibodies needs confirmation on 2 separate equations 12 weeks apart.    -pt will need a hematologist in [place] where she resides to discuss determination of long term anticoagulation, this has been discussed with pt  -hypercoaguable work up sent    Hyponatremia  -Management with dialysis    Acute blood loss anemia, due to left renal hemorrhage  Anemia of chronic disease  -Procrit per nephrology  -H&H stable  -Transfuse as needed for hemoglobin <7  -c/w epoetin    ESRD   - per nephrology  - m, w, f    HTN - c/w norvasc    Asthma - stable       Code status: full  Prophylaxis: SCDs    Plan: IF tolerates eliquis, discharge to home  tomorrow  Care Plan discussed with: Patient, nursing, attending  Anticipated Disposition: TBD, patient lives in [place] and follows up at [place]:              Social Determinants of Health     Tobacco Use: Low Risk  (05/07/2022)    Patient History     Smoking Tobacco Use: Never     Smokeless Tobacco Use: Never     Passive Exposure: Not on file   Alcohol Use: Not At Risk (05/07/2022)    AUDIT-C     Frequency of Alcohol Consumption: Never     Average Number of Drinks: Patient does not drink     Frequency of Binge Drinking: Never   Financial Resource Strain: Not on file   Food  Insecurity: Not on file   Transportation Needs: Not on file   Physical Activity: Not on file   Stress: Not on file   Social Connections: Not on file   Intimate Partner Violence: Not on file   Depression: Not on file   Housing Stability: Not on file   Interpersonal Safety: Not on file   Utilities: Not on file       Review of Systems:   A comprehensive review of systems was negative.       Vital Signs:    Last 24hrs VS reviewed since prior progress note. Most recent are:  Vitals:    05/16/22 1145   BP: 118/80   Pulse: 72   Resp:    Temp:    SpO2:        No intake or output data in the 24 hours ending 05/16/22 1152       Physical Examination:     I had a face to face encounter with this patient and independently examined them on 05/16/2022 as outlined below:    General: Patient in no acute respiratory distress.  HEENT: Normocephalic, atraumatic, no otorrhea, no rhinorrhea, nares patent, PEERL, EOMI, moist mucus membranes.   Neck: Supple, no JVD, Trachea midline.  Hypervascular nonpulsatile chronic appearance across anterior neck and supraclavicular region  Respiratory/ Chest: Clear to auscultation bilaterally   CVS: Regular rate.  Regular rhythm.  Normal S1-S2.  Pansystolic blowing murmur  GI/ Abd: Soft.  Obese.  Nondistended.  Generalized tenderness to light palpation.  Voluntary guarding.  No rebound or rigidity.   Normoactive bowel sounds.   Musculoskeletal/Ext:   Nonpitting edema about lower extremities. Chronic joint deformity with large left foot bunion.  Neuro: Moves all extremities x 4.  Generalized weakness strength 4/5 bilateral upper/lower extremities.  No slurred speech.  No facial droop.  Sensation grossly intact.    Vascular/ Pulses: 2+ radial, 1+ dorsalis pedis pulse bilateral. Right arm AV fistula with palpable thrill.  Integument/ Skin: Warm, dry, without rashes or lesions           Data Review:    Review and/or order of clinical lab test  Review and/or order of tests in the radiology section of CPT  Review and/or order of tests in the medicine section of CPT      I have personally and independently reviewed all pertinent labs, diagnostic studies, imaging, and have provided independent interpretation of the same.     Labs:     Recent Labs     05/16/22  0830   HGB 8.0*   HCT 24.3*       Recent Labs     05/16/22  0830   NA 127*   K 5.1   CL 95*   CO2 25   BUN 49*   PHOS 5.4*       No results for input(s): "ALT", "TP", "ALB", "GLOB", "GGT", "AML" in the last 72 hours.    Invalid input(s): "SGOT", "GPT", "AP", "TBIL", "TBILI", "AMYP", "LPSE", "HLPSE"    No results for input(s): "INR", "APTT" in the last 72 hours.    Invalid input(s): "PTP"     No results for input(s): "TIBC", "FERR" in the last 72 hours.    Invalid input(s): "FE", "PSAT"     No results found for: "FOL", "RBCF"   No results for input(s): "PH", "PCO2", "PO2" in the last 72 hours.  No results for input(s): "CPK" in the last 72 hours.    Invalid input(s): "CPKMB", "  CKNDX", "TROIQ"  No results found for: "CHOL", "[place]", "CHLST", "CHOLV", "HDL", "HDLC", "LDL", "LDLC", "TGLX", "TRIGL"  No results found for: "GLUCPOC"  @LABUA$ @    Notes reviewed from all clinical/nonclinical/nursing services involved in patient's clinical care. Care coordination discussions were held with appropriate clinical/nonclinical/ nursing providers based on care coordination needs.          Patients current active Medications were reviewed, considered, added and adjusted based on the clinical condition today.      Home Medications were reconciled to the best of my ability given all available resources at the time of admission. Route is PO if not otherwise noted.      Admission Status:[id number]:::1}      Medications Reviewed:     Current Facility-Administered Medications   Medication Dose Route Frequency    albumin human 25% IV solution 25 g  25 g IntraVENous PRN    HYDROmorphone (DILAUDID) tablet 2 mg  2 mg Oral Q4H PRN    epoetin alfa-epbx (RETACRIT) injection 20,000 Units  20,000 Units SubCUTAneous Once per day on Mon Wed Fri    prochlorperazine (COMPAZINE) injection 10 mg  10 mg IntraVENous Q6H PRN    promethazine (PHENERGAN) tablet 25 mg  25 mg Oral Q6H PRN    sodium chloride flush 0.9 % injection 5-40 mL  5-40 mL IntraVENous 2 times per day    sodium chloride flush 0.9 % injection 5-40 mL  5-40 mL IntraVENous PRN    0.9 % sodium chloride infusion   IntraVENous PRN    ondansetron (ZOFRAN-ODT) disintegrating tablet 4 mg  4 mg Oral Q8H PRN    Or    ondansetron (ZOFRAN) injection 4 mg  4 mg IntraVENous Q6H PRN    polyethylene glycol (GLYCOLAX) packet 17 g  17 g Oral Daily PRN    amLODIPine (NORVASC) tablet 10 mg  10 mg Oral Daily    calcium acetate (PHOSLO) tablet 667 mg  667 mg Oral TID WC     ______________________________________________________________________  EXPECTED LENGTH OF STAY: 10  ACTUAL LENGTH OF STAY:          7                 Burnadette Peter, MD

## 2022-05-16 NOTE — Progress Notes (Signed)
[facility]   [address] [place] [address]  Phone: [phone]   [phone]    [email or web address]     Nephrology Progress Note    Patient Name : Meredith Hawkins     DOB : 1985/11/18     MRN : [id number]  Date of Admission : 05/07/2022  Date of Servive : 05/16/22    CC:  Follow up for ESRD       Assessment and Plan   ESRD on HD   - dialyzes MWF in [place]  - access RAVF + bruit/ thrill  - HD today     Hyponatremia   - discussed FR  - UF challenge today w/ HD    L renal hemorrhage  HTN  SLE  SVC syndrome  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 Plan to remove 3L as tolerated  .  Review of Systems: Pertinent items are noted in HPI.    Current Medications:   Current Facility-Administered Medications   Medication Dose Route Frequency    albumin human 25% IV solution 25 g  25 g IntraVENous PRN    HYDROmorphone (DILAUDID) tablet 2 mg  2 mg Oral Q4H PRN    epoetin alfa-epbx (RETACRIT) injection 20,000 Units  20,000 Units SubCUTAneous Once per day on Mon Wed Fri    prochlorperazine (COMPAZINE) injection 10 mg  10 mg IntraVENous Q6H PRN    promethazine (PHENERGAN) tablet 25 mg  25 mg Oral Q6H PRN    sodium chloride flush 0.9 % injection 5-40 mL  5-40 mL IntraVENous 2 times per day    sodium chloride flush 0.9 % injection 5-40 mL  5-40 mL IntraVENous PRN    0.9 % sodium chloride infusion   IntraVENous PRN    ondansetron (ZOFRAN-ODT) disintegrating tablet 4 mg  4 mg Oral Q8H PRN    Or    ondansetron (ZOFRAN) injection 4 mg  4 mg IntraVENous Q6H PRN    polyethylene glycol (GLYCOLAX) packet 17 g  17 g Oral Daily PRN    amLODIPine (NORVASC) tablet 10 mg  10 mg Oral Daily    calcium acetate (PHOSLO) tablet 667 mg  667 mg Oral TID WC      Allergies   Allergen Reactions    Ace Inhibitors Anaphylaxis     Pt states "all of these have closed my throat"    Acetaminophen Anaphylaxis     Pt states "all  of these have closed my throat"    Codeine Anaphylaxis     Pt states "all of these have closed my throat"    Fentanyl Anaphylaxis    Hydrocodone Anaphylaxis     Pt states "all of these have closed my throat"    Nsaids Other (See Comments)     pcp states not to take due to kidneys     Oxycodone Anaphylaxis     Pt states "all of these have closed my throat"    Sulfa Antibiotics Anaphylaxis       Objective:  Vitals:    Vitals:    05/16/22 0945 05/16/22 1000 05/16/22 1015 05/16/22 1030   BP: 116/81 119/79 124/80 126/78   Pulse: 72 78 81 82   Resp:       Temp:       TempSrc:       SpO2:  Height:         Intake and Output:  No intake/output data recorded.  No intake/output data recorded.    Physical Examination:  General: NAD,Conversant   Neck:  Supple, no mass  Resp:  no wheezing , normal respiratory effort  CV:  RRR, + murmur , trace B/L LE edema  GI:  Soft, NT, + BS, no HS megaly  Neurologic:  Non focal  Psych:             AAO x 3 appropriate affect   Skin:  No Rash  Dialysis Access : RUE AVF + bruit    []$     High complexity decision making was performed  []$     Patient is at high-risk of decompensation with multiple organ involvement    Lab Data Personally Reviewed: I have reviewed all the pertinent labs, microbiology data and radiology studies during assessment.    Labs:  Recent Labs     05/16/22  0830   NA 127*   K 5.1   CL 95*   CO2 25   GLUCOSE 82   BUN 49*   CREATININE 10.30*   CALCIUM 8.1*         Recent Labs     05/16/22  0830   HGB 8.0*   HCT 24.3*       No results for input(s): "TP", "ALB", "GLOB", "AML" in the last 72 hours.    Invalid input(s): "SGOT", "GPT", "AP", "TBIL", "AMYP", "LPSE", "LAC"    No results for input(s): "INR", "APTT" in the last 72 hours.    Invalid input(s): "PTP"     No results for input(s): "CPK", "CKMB", "TROPONINI" in the last 72 hours.    Invalid input(s): "B-NP"  Invalid input(s): "PHI", "PCO2I", "PO2I", "FIO2I"     Ventilator:       Microbiology:  No results found for:  "SDES"  No components found for: "CULT"      I have reviewed the flowsheets.  Chart and Pertinent Notes have been reviewed.   No change in PMH ,family and social history from Consult note.      Sunday Shams, MD  [facility]

## 2022-05-16 NOTE — Other (Addendum)
HD Tx Summary: Tolerated 3.5hrs & 3L.      05/16/22 0836   Vital Signs   BP 124/83   Temp 97.7 F (36.5 C)   Pulse 77   Respirations 16   SpO2 95 %   Pain Assessment   Pain Assessment 0-10   Pain Level 9   Pain Location Flank   Pain Descriptors Aching   Patient's Stated Pain Goal 0 - No pain   Treatment   Time On 0836   Treatment Goal 2.5L over 3.5hrs   Observations & Evaluations   Level of Consciousness 0   Oriented X 4   Heart Rhythm Regular   Respiratory Quality/Effort Unlabored   O2 Device None (Room air)   Bilateral Breath Sounds Clear   Skin Color   (Appropriate for ethnicity)   Skin Condition/Temp Dry;Warm   Appetite Fair   Abdomen Inspection Soft   Bowel Sounds (All Quadrants) Active   RLE Edema Trace   LLE Edema Trace   Technical Checks   Dialysis Machine No. [id number]   RO Machine Number [id number]   Dialyzer Lot No. [id number]   Tubing Lot Number [id number]   All Connections Secure Yes   NS Bag Yes   Saline Line Double Clamped Yes   Dialyzer Revaclear 300   Prime Volume (mL) 200 mL   ICEBOAT I;C;E;B;O;A;T   RO Machine Log Sheet Completed Yes   Machine Alarm Self Test Completed;Film/video editor Conductivity 13.6   Manual Conductivity 13.8   Manual Ph 7.4   Bleach Test (Neg) Yes   Bath Temperature 98.6 F (37 C)   Dialysis Bath   K+ (Potassium) 2   Ca+ (Calcium) 2.5   Na+ (Sodium) 138   HCO3 (Bicarb) 38   Bicarbonate Concentrate Lot No. [id number]   Acid Concentrate Lot No. [id number]   Treatment Initiation   Dialyze Hours 3.5   Treatment  Initiation Universal Precautions maintained;Lines secured to patient;Connections secured;Prime given;Venous Parameters set;Arterial Parameters set;Chiropodist engaged;Revaclear Dialyzer;REV-300   Hemodialysis Fistula/Graft Arteriovenous fistula Right Arm   Placement Date/Time: 05/07/22 0820   Present on Admission/Arrival: Yes  Access Type: Arteriovenous fistula  Orientation: Right   Access Location: Arm   Site Assessment Clean, dry & intact   Thrill Present   Bruit Present   Status Accessed   Venous Needle Size 15 G   Arterial Needle Size 15 G   Accessed By: Schuyler Amor, RN   Access Attempts  1   Access Interventions Chlorhexidine;Aseptic Technique;Needles taped to patient   Primary RN SBAR: Delsa Grana, RN  Patient Education: Eating while on HD - patient not wishing to be compliant. Patient stating "I'll eat when I see fit."  Hospital associated wait time; reason: None    0830 - Labs drawn.  79 - Dr. Ailene Rud at bedside - UF increased to 3L.    Hepatitis B Surface Ag   Date/Time Value Ref Range Status   05/07/2022 09:35 PM <0.10 Index Final     Hep B S Ab   Date/Time Value Ref Range Status   05/07/2022 09:35 PM 277.97 mIU/mL Final          05/16/22 1206   Treatment   Time Off 1206   Observations & Evaluations   Level of Consciousness 0   Oriented X 4   Heart Rhythm Regular   Respiratory Quality/Effort Unlabored   O2 Device None (Room  air)   Bilateral Breath Sounds Clear   Skin Condition/Temp Dry;Warm   Appetite Fair   Abdomen Inspection Soft   RLE Edema Trace   LLE Edema Trace   Vital Signs   BP 139/89   Temp 97.7 F (36.5 C)   Pulse 76   Respirations 18   SpO2 96 %   Pain Assessment   Pain Assessment 0-10   Pain Level 9  (primary RN aware)   During Hemodialysis Assessment   Blood Flow Rate (ml/min) 400 ml/min   Arterial Pressure (mmHg) -170 mmHg   Venous Pressure (mmHg) 190   TMP 50   DFR 600   Comments HD Completed   Access Visible Yes   Ultrafiltration Rate (ml/hr) 1100 ml/hr   Ultrafiltration Removed (ml) 3500 ml   Hemodialysis Fistula/Graft Arteriovenous fistula Right Arm   Placement Date/Time: 05/07/22 0820   Present on Admission/Arrival: Yes  Access Type: Arteriovenous fistula  Orientation: Right  Access Location: Arm   Site Assessment Clean, dry & intact   Thrill Present   Bruit Present   Status Deaccessed   Date of Last Dressing Change 05/16/22   Dressing Intervention New    Dressing Status New dressing applied;Clean, dry & intact   Post-Hemodialysis Assessment   Post-Treatment Procedures Blood returned;Access bleeding time < 10 minutes   Herbalist   Rinseback Volume (ml) 300 ml   Blood Volume Processed (Liters) 79 L   Dialyzer Clearance Lightly streaked   Duration of Treatment (minutes) 210 minutes   Hemodialysis Intake (ml) 500 ml   Hemodialysis Output (ml) 3500 ml   NET Removed (ml) 3000   Tolerated Treatment Good   Patient Response to Treatment Tolerated tx well   Patient Disposition Other (Comment)  (remains in room - bedside tx)

## 2022-05-17 LAB — CARDIOLIPIN ANTIBODIES IGG & IGM
Cardiolipin Antibody, IgG: 14 GPL U/mL (ref 0–14)
Cardiolipin Antibody, IgM: 20 MPL U/mL — ABNORMAL HIGH (ref 0–12)

## 2022-05-17 MED FILL — ELIQUIS 2.5 MG PO TABS: 2.5 MG | ORAL | Qty: 1

## 2022-05-17 MED FILL — CALCIUM ACETATE (PHOS BINDER) 667 MG PO TABS: 667 MG | ORAL | Qty: 1

## 2022-05-17 MED FILL — PROMETHAZINE HCL 25 MG PO TABS: 25 MG | ORAL | Qty: 1

## 2022-05-17 MED FILL — HYDROMORPHONE HCL 2 MG PO TABS: 2 MG | ORAL | Qty: 1

## 2022-05-17 MED FILL — ONDANSETRON HCL 4 MG/2ML IJ SOLN: 4 MG/2ML | INTRAMUSCULAR | Qty: 2

## 2022-05-17 NOTE — Progress Notes (Signed)
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    Interval history / Subjective:   Follow up left renal hemorrhage  On anticoagulation now   Getting dialysis  Says her train to [place] is tomorrow  Assessment & Plan:     Left renal hemorrhage  -last dose of Eliquis 2/17, [place] held on admission  -evaluated by Urology, recommend IR embolization if hematuria is noted  -2/22 evaluated by vascular: hold [place] and restart when safe/feasible  -2/23 Hematology consulted & recommend hold [facility] for 48 hrs and restarting Eliquis at 2.5 mg BID if H&H remains stable  -Started on Eliquis 2/26    Hx of SVC Syndrome  SLE (per pt)  -diagnosed in 2019-2020, related to her previous perm cath  -Unsure if she has lupus coagulants, pt is a poor historian  -on long term oac w/ Eliquis  -lives in [place], outside records limited. Pt's surgical team manages SVC per her report  -per hematology, the diagnosis of antiphospholipid antibodies needs confirmation on 2 separate equations 12 weeks apart.    -pt will need a hematologist in [place] where she resides to discuss determination of long term anticoagulation, this has been discussed with pt  -hypercoaguable work up sent    Hyponatremia  -Management with dialysis    Acute blood loss anemia, due to left renal hemorrhage  Anemia of chronic disease  -Procrit per nephrology  -H&H stable  -Transfuse as needed for hemoglobin <7  -c/w epoetin    ESRD   - per nephrology  - m, w, f    HTN - c/w norvasc    Asthma - stable       Code status: full  Prophylaxis: SCDs    Plan: Discharge to  home tomorrow after dialysis  Care Plan discussed with: Patient, nursing, attending  Anticipated Disposition: patient lives in [place] and follows up at [place]:              Social Determinants of Health     Tobacco Use: Low Risk  (05/07/2022)    Patient History     Smoking Tobacco Use: Never     Smokeless Tobacco Use: Never     Passive Exposure: Not on file   Alcohol Use: Not At Risk (05/07/2022)    AUDIT-C     Frequency of Alcohol Consumption: Never     Average Number of Drinks: Patient does not drink     Frequency of Binge Drinking: Never   Financial Resource Strain: Not on  file   Food Insecurity: Not on file   Transportation Needs: Not on file   Physical Activity: Not on file   Stress: Not on file   Social Connections: Not on file   Intimate Partner Violence: Not on file   Depression: Not on file   Housing Stability: Not on file   Interpersonal Safety: Not on file   Utilities: Not on file       Review of Systems:   A comprehensive review of systems was negative.       Vital Signs:    Last 24hrs VS reviewed since prior progress note. Most recent are:  Vitals:    05/17/22 0741   BP: 113/72   Pulse: 65   Resp: 12   Temp: 98.2 F (36.8 C)   SpO2: 100%         Intake/Output Summary (Last 24 hours) at 05/17/2022 1204  Last data filed at 05/17/2022 1100  Gross per 24 hour   Intake 722 ml   Output 3500 ml   Net -2778 ml          Physical Examination:     I had a face to face encounter with this patient and independently examined them on 05/17/2022 as outlined below:    General: Patient in no acute respiratory distress.  HEENT: Normocephalic, atraumatic, no otorrhea, no rhinorrhea, nares patent, PEERL, EOMI, moist mucus membranes.   Neck: Supple, no JVD, Trachea midline.  Hypervascular nonpulsatile chronic appearance across anterior neck and supraclavicular region  Respiratory/ Chest: Clear to auscultation bilaterally   CVS: Regular rate.  Regular rhythm.  Normal S1-S2.  Pansystolic blowing  murmur  GI/ Abd: Soft.  Obese.  Nondistended.  Generalized tenderness to light palpation.  Voluntary guarding.  No rebound or rigidity.  Normoactive bowel sounds.   Musculoskeletal/Ext:   Nonpitting edema about lower extremities. Chronic joint deformity with large left foot bunion.  Neuro: Moves all extremities x 4.  Generalized weakness strength 4/5 bilateral upper/lower extremities.  No slurred speech.  No facial droop.  Sensation grossly intact.    Vascular/ Pulses: 2+ radial, 1+ dorsalis pedis pulse bilateral. Right arm AV fistula with palpable thrill.  Integument/ Skin: Warm, dry, without rashes or lesions           Data Review:    Review and/or order of clinical lab test  Review and/or order of tests in the radiology section of CPT  Review and/or order of tests in the medicine section of CPT      I have personally and independently reviewed all pertinent labs, diagnostic studies, imaging, and have provided independent interpretation of the same.     Labs:     Recent Labs     05/16/22  0830   HGB 8.0*   HCT 24.3*       Recent Labs     05/16/22  0830   NA 127*   K 5.1   CL 95*   CO2 25   BUN 49*   PHOS 5.4*       No results for input(s): "ALT", "TP", "ALB", "GLOB", "GGT", "AML" in the last 72 hours.    Invalid input(s): "SGOT", "GPT", "AP", "TBIL", "TBILI", "AMYP", "LPSE", "HLPSE"    No results for input(s): "INR", "APTT" in the last 72 hours.    Invalid input(s): "PTP"     No results for input(s): "TIBC", "FERR" in the last 72 hours.    Invalid input(s): "FE", "PSAT"     No results  found for: "FOL", "RBCF"   No results for input(s): "PH", "PCO2", "PO2" in the last 72 hours.  No results for input(s): "CPK" in the last 72 hours.    Invalid input(s): "CPKMB", "CKNDX", "TROIQ"  No results found for: "CHOL", "[place]", "CHLST", "CHOLV", "HDL", "HDLC", "LDL", "LDLC", "TGLX", "TRIGL"  No results found for: "GLUCPOC"  @LABUA$ @    Notes reviewed from all clinical/nonclinical/nursing services involved in patient's clinical  care. Care coordination discussions were held with appropriate clinical/nonclinical/ nursing providers based on care coordination needs.         Patients current active Medications were reviewed, considered, added and adjusted based on the clinical condition today.      Home Medications were reconciled to the best of my ability given all available resources at the time of admission. Route is PO if not otherwise noted.      Admission Status:[id number]:::1}      Medications Reviewed:     Current Facility-Administered Medications   Medication Dose Route Frequency    albumin human 25% IV solution 25 g  25 g IntraVENous PRN    apixaban (ELIQUIS) tablet 2.5 mg  2.5 mg Oral Daily    HYDROmorphone (DILAUDID) tablet 2 mg  2 mg Oral Q4H PRN    epoetin alfa-epbx (RETACRIT) injection 20,000 Units  20,000 Units SubCUTAneous Once per day on Mon Wed Fri    prochlorperazine (COMPAZINE) injection 10 mg  10 mg IntraVENous Q6H PRN    promethazine (PHENERGAN) tablet 25 mg  25 mg Oral Q6H PRN    sodium chloride flush 0.9 % injection 5-40 mL  5-40 mL IntraVENous 2 times per day    sodium chloride flush 0.9 % injection 5-40 mL  5-40 mL IntraVENous PRN    0.9 % sodium chloride infusion   IntraVENous PRN    ondansetron (ZOFRAN-ODT) disintegrating tablet 4 mg  4 mg Oral Q8H PRN    Or    ondansetron (ZOFRAN) injection 4 mg  4 mg IntraVENous Q6H PRN    polyethylene glycol (GLYCOLAX) packet 17 g  17 g Oral Daily PRN    amLODIPine (NORVASC) tablet 10 mg  10 mg Oral Daily    calcium acetate (PHOSLO) tablet 667 mg  667 mg Oral TID WC     ______________________________________________________________________  EXPECTED LENGTH OF STAY: 11  ACTUAL LENGTH OF STAY:          8                 Burnadette Peter, MD

## 2022-05-17 NOTE — Progress Notes (Signed)
[facility]   [address], [place] [address]  Phone: [phone]   [phone]    [email or web address]     Nephrology Progress Note    Patient Name : Meredith Hawkins     DOB : 12-Nov-1985     MRN : [id number]  Date of Admission : 05/07/2022  Date of Servive : 05/17/22    CC:  Follow up for ESRD       Assessment and Plan   ESRD on HD   - dialyzes MWF in [place]  - access RAVF + bruit/ thrill  -HD tomorrow prior to discharge if she is remains in the hospital    Hyponatremia   -Continue with UF challenge with dialysis    L renal hemorrhage  -Back on AC    HTN  SLE  SVC syndrome  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  She tolerated 3 L UF with dialysis yesterday.  She was supposed to get postdialysis standing scale weight which did not happen.  Anticoagulation was restarted yesterday.  She denies any new symptoms.  Anticipated discharge in the next 24 hours.  Discussed dialysis in the hospital prior to discharge tomorrow given that she has to travel to [place]  .  Review of Systems: Pertinent items are noted in HPI.    Current Medications:   Current Facility-Administered Medications   Medication Dose Route Frequency    albumin human 25% IV solution 25 g  25 g IntraVENous PRN    apixaban (ELIQUIS) tablet 2.5 mg  2.5 mg Oral Daily    HYDROmorphone (DILAUDID) tablet 2 mg  2 mg Oral Q4H PRN    epoetin alfa-epbx (RETACRIT) injection 20,000 Units  20,000 Units SubCUTAneous Once per day on Mon Wed Fri    prochlorperazine (COMPAZINE) injection 10 mg  10 mg IntraVENous Q6H PRN    promethazine (PHENERGAN) tablet 25 mg  25 mg Oral Q6H PRN    sodium chloride flush 0.9 % injection 5-40 mL  5-40 mL IntraVENous 2 times per day    sodium chloride flush 0.9 % injection 5-40 mL  5-40 mL IntraVENous PRN    0.9 % sodium chloride infusion   IntraVENous PRN    ondansetron (ZOFRAN-ODT) disintegrating tablet 4 mg  4 mg Oral  Q8H PRN    Or    ondansetron (ZOFRAN) injection 4 mg  4 mg IntraVENous Q6H PRN    polyethylene glycol (GLYCOLAX) packet 17 g  17 g Oral Daily PRN    amLODIPine (NORVASC) tablet 10 mg  10 mg Oral Daily    calcium acetate (PHOSLO) tablet 667 mg  667 mg Oral TID WC      Allergies   Allergen Reactions    Ace Inhibitors Anaphylaxis     Pt states "all of these have closed my throat"    Acetaminophen Anaphylaxis     Pt states "all of these have closed my throat"    Codeine Anaphylaxis     Pt states "all of these have closed my throat"    Fentanyl Anaphylaxis    Hydrocodone Anaphylaxis     Pt states "all of these have closed my throat"    Nsaids Other (See Comments)     pcp states not to take due to kidneys     Oxycodone Anaphylaxis     Pt states "all of these have  closed my throat"    Sulfa Antibiotics Anaphylaxis       Objective:  Vitals:    Vitals:    05/16/22 1458 05/16/22 2020 05/17/22 0331 05/17/22 0741   BP: 133/85 124/79 117/79 113/72   Pulse: 75 75 65 65   Resp: 14 16 18 12   $ Temp: 99 F (37.2 C) 98.6 F (37 C) 98.1 F (36.7 C) 98.2 F (36.8 C)   TempSrc: Oral Oral Oral Oral   SpO2: 100% 100% 100% 100%   Height:         Intake and Output:  No intake/output data recorded.  02/25 1901 - 02/27 0700  In: 500   Out: 3500     Physical Examination:  General: NAD,Conversant   Neck:  Supple, no mass  Resp:  no wheezing , normal respiratory effort  CV:  RRR, + murmur , trace B/L LE edema  GI:  Soft, NT, + BS, no HS megaly  Neurologic:  Non focal  Psych:             AAO x 3 appropriate affect   Skin:  No Rash  Dialysis Access : RUE AVF + bruit    []$     High complexity decision making was performed  []$     Patient is at high-risk of decompensation with multiple organ involvement    Lab Data Personally Reviewed: I have reviewed all the pertinent labs, microbiology data and radiology studies during assessment.    Labs:  Recent Labs     05/16/22  0830   NA 127*   K 5.1   CL 95*   CO2 25   GLUCOSE 82   BUN 49*   CREATININE  10.30*   CALCIUM 8.1*         Recent Labs     05/16/22  0830   HGB 8.0*   HCT 24.3*       No results for input(s): "TP", "ALB", "GLOB", "AML" in the last 72 hours.    Invalid input(s): "SGOT", "GPT", "AP", "TBIL", "AMYP", "LPSE", "LAC"    No results for input(s): "INR", "APTT" in the last 72 hours.    Invalid input(s): "PTP"     No results for input(s): "CPK", "CKMB", "TROPONINI" in the last 72 hours.    Invalid input(s): "B-NP"  Invalid input(s): "PHI", "PCO2I", "PO2I", "FIO2I"     Ventilator:       Microbiology:  No results found for: "SDES"  No components found for: "CULT"      I have reviewed the flowsheets.  Chart and Pertinent Notes have been reviewed.   No change in PMH ,family and social history from Consult note.      Sunday Shams, MD  [facility]

## 2022-05-17 NOTE — Other (Incomplete)
Primary RN SBAR: Noopor, Health and safety inspector  Patient Education: Dialysis Procedure  Hospital associated wait time; reason: 0  Hepatitis B Surface Ag   Date/Time Value Ref Range Status   05/07/2022 09:35 PM <0.10 Index Final     Hep B S Ab   Date/Time Value Ref Range Status   05/07/2022 09:35 PM 277.97 mIU/mL Final       05/17/22 2245   Vital Signs   BP 131/85   Temp 97.7 F (36.5 C)   Pulse 78   Respirations 14   Pain Assessment   Pain Assessment None - Denies Pain   Pain Level 0   Treatment   Time On 2251   Time Off 0222   Treatment Goal 3500   Observations & Evaluations   Level of Consciousness 0   Oriented X 4   Heart Rhythm Regular   Respiratory Quality/Effort Unlabored   O2 Device None (Room air)   Bilateral Breath Sounds Clear   Skin Color   (appropriate for ethnicty)   Skin Condition/Temp Warm;Dry   Appetite Good   Abdomen Inspection Soft;Rounded   Bowel Sounds (All Quadrants) Present   Edema Right lower extremity;Left lower extremity   RLE Edema Trace   Technical Checks   Dialysis Machine No. [id number]   RO Machine Number 08   Dialyzer Lot No. [id number]   Tubing Lot Number [id number]   All Connections Secure Yes   NS Bag Yes   Saline Line Double Clamped Yes   Dialyzer Revaclear 300   Prime Volume (mL) 250 mL   ICEBOAT I;C;E;B;O;A;T   RO Machine Log Sheet Completed Yes   Machine Alarm Self Test Completed;Programme researcher, broadcasting/film/video Conductivity 13.8   Manual Conductivity 14   Manual Ph 7   Bleach Test (Neg) Yes   Bath Temperature 98.6 F (37 C)   Dialysis Bath   K+ (Potassium) 2   Ca+ (Calcium) 2.5   Na+ (Sodium) 138   HCO3 (Bicarb) 38   Bicarbonate Concentrate Lot No. [id number]   Acid Concentrate Lot No. [id number]   Treatment Initiation   Dialyze Hours 3.5   Treatment  Initiation Universal Precautions maintained;Lines secured to patient;Connections secured;Prime given;Venous Parameters set;Arterial Parameters set;Air foam detector  engaged;Dialysate;Saline line double clamped;Hemo-Safe Applied;Revaclear Dialyzer;REV-300   Hemodialysis Fistula/Graft Arteriovenous fistula Right Arm   Placement Date/Time: 05/07/22 0820   Present on Admission/Arrival: Yes  Access Type: Arteriovenous fistula  Orientation: Right  Access Location: Arm   Site Assessment Clean, dry & intact   Thrill Present   Bruit Present   Status Accessed   Venous Needle Size 15 G   Arterial Needle Size 15 G   Accessed By: m Erika Slaby   Access Attempts  1   Access Interventions Needles taped to patient;Aseptic Technique;Chlorhexidine      05/17/22 2251   During Hemodialysis Assessment   BP 131/85   Pulse 78   Blood Flow Rate (ml/min) 400 ml/min   Arterial Pressure (mmHg) -130 mmHg   Venous Pressure (mmHg) 160   TMP 60   DFR 600   Comments HD started   Access Visible Yes   Ultrafiltration Rate (ml/hr) 1140 ml/hr   Ultrafiltration Removed (ml) 0 ml     HD treatment complete. All possible blood returned to the patient. De-cannulated and pressure held until hemostasis was achieved. No bleeding noted.   Dressing applied. +bruit/thrill.   Comments: Patient tolerated treatment without  incident, UF goal achieved. At end of treatment patient is awake and alert, VSS. Needle limbs cleaned, all possible blood rinsed back to patient, limbs flushed with 10cc NS. Needles removed, manual pressure applied over puncture sites with clean dry folded 2x2 for approximately five minutes, hemostasis achieved. Puncture sites covered with clean dry folded 2x2, secured with tape. Bed wheels locked, bed in low position, call light in reach.   Comfort and care rendered, needs attended, questions answered.  Call bell within reach, bed in lowest position.  Report given to Noopor, [employer]

## 2022-05-18 MED ORDER — HYDROMORPHONE HCL 2 MG PO TABS
2 | ORAL_TABLET | ORAL | 0 refills | Status: AC | PRN
Start: 2022-05-18 — End: 2022-05-21

## 2022-05-18 MED ORDER — APIXABAN 2.5 MG PO TABS
2.5 | Freq: Two times a day (BID) | ORAL | Status: DC
Start: 2022-05-18 — End: 2022-05-18

## 2022-05-18 MED FILL — AMLODIPINE BESYLATE 5 MG PO TABS: 5 MG | ORAL | Qty: 2

## 2022-05-18 MED FILL — ONDANSETRON HCL 4 MG/2ML IJ SOLN: 4 MG/2ML | INTRAMUSCULAR | Qty: 2

## 2022-05-18 MED FILL — CALCIUM ACETATE (PHOS BINDER) 667 MG PO TABS: 667 MG | ORAL | Qty: 1

## 2022-05-18 MED FILL — HYDROMORPHONE HCL 2 MG PO TABS: 2 MG | ORAL | Qty: 1

## 2022-05-18 MED FILL — PROMETHAZINE HCL 25 MG PO TABS: 25 MG | ORAL | Qty: 1

## 2022-05-18 MED FILL — ELIQUIS 2.5 MG PO TABS: 2.5 MG | ORAL | Qty: 1

## 2022-05-18 NOTE — Discharge Instructions (Signed)
Discharge Instructions       PATIENT ID: Meredith Hawkins  MRN: [id number]   DATE OF BIRTH: 1985-10-29    DATE OF ADMISSION: @ADMITDTTM$ @    DATE OF DISCHARGE: 05/18/2022    PRIMARY CARE PROVIDER: @PCP$ @     ATTENDING PHYSICIAN: @ATTPROV$ @  DISCHARGING PROVIDER: Burnadette Peter, MD    To contact this individual call [phone] and ask the operator to page.   If unavailable ask to be transferred the Adult Hospitalist Department.    DISCHARGE DIAGNOSES ***    CONSULTATIONS: @CONORDS$ @    PROCEDURES/SURGERIES: * No surgery found *    PENDING TEST RESULTS:   At the time of discharge the following test results are still pending: ***    FOLLOW UP APPOINTMENTS:   @DCFOLLOWUP$ @     ADDITIONAL CARE RECOMMENDATIONS: ***    DIET: {diet:18262}  Oral Nutritional Supplements: {RDSUPPLEMENTLIST:40617} {RDSUPPFREQUENCY:40616}     ACTIVITY: {discharge activity:18261}    WOUND CARE: ***    EQUIPMENT needed: ***      Radiology      DISCHARGE MEDICATIONS:   See Medication Reconciliation Form    It is important that you take the medication exactly as they are prescribed.   Keep your medication in the bottles provided by the pharmacist and keep a list of the medication names, dosages, and times to be taken in your wallet.   Do not take other medications without consulting your doctor.       NOTIFY YOUR PHYSICIAN FOR ANY OF THE FOLLOWING:   Fever over 101 degrees for 24 hours.   Chest pain, shortness of breath, fever, chills, nausea, vomiting, diarrhea, change in mentation, falling, weakness, bleeding. Severe pain or pain not relieved by medications.  Or, any other signs or symptoms that you may have questions about.      DISPOSITION:    Home With:   OT  PT  HH  RN       SNF/Inpatient Rehab/LTAC    Independent/assisted living    Hospice    Other:     CDMP Checked:   Yes ***     PROBLEM LIST Updated:  Yes ***       Signed:   Burnadette Peter, MD  05/18/2022  9:19 AM

## 2022-05-18 NOTE — Discharge Summary (Signed)
Discharge Summary       PATIENT ID: Meredith Hawkins  MRN: [id number]   DATE OF BIRTH: [birth year]    DATE OF ADMISSION: 05/07/2022  7:40 PM    DATE OF DISCHARGE: 05/18/2022   PRIMARY CARE PROVIDER: No primary care provider on file.     ATTENDING PHYSICIAN: Dr Burnadette Peter  DISCHARGING PROVIDER: Burnadette Peter, MD    To contact this individual call [phone] and ask the operator to page.  If unavailable ask to be transferred the Adult Hospitalist Department.    CONSULTATIONS: IP CONSULT TO NEPHROLOGY  IP CONSULT TO VASCULAR SURGERY  IP CONSULT TO HEMATOLOGY    PROCEDURES/SURGERIES: * No surgery found *    [place] COURSE:   Left renal hemorrhage  -last dose of Eliquis 2/17, [place] held on admission  -evaluated by Urology, recommend IR embolization if hematuria is noted  -2/22 evaluated by vascular: hold [place] and restart when safe/feasible  -2/23 Hematology consulted & recommend hold [place] for 48 hrs and restarting Eliquis at 2.5 mg BID if H&H remains stable  -Started on Eliquis 2/26     Hx of SVC Syndrome  SLE (per pt)  -diagnosed in 2019-2020, related to her previous perm cath  -Unsure if she has lupus coagulants, pt is a poor historian  -on long term oac w/ Eliquis  -lives in [place], outside records limited. Pt's surgical team manages SVC per her report  -per hematology, the diagnosis of antiphospholipid antibodies needs confirmation on 2 separate equations 12 weeks apart.    -pt will need a hematologist in [place] where she resides to discuss determination of long term anticoagulation, this has been discussed with pt  -hypercoaguable work up sent     Hyponatremia  -Management with dialysis     Acute blood loss anemia, due to left renal hemorrhage  Anemia of chronic disease  -Procrit per nephrology  -H&H stable  -Transfuse as needed for hemoglobin <7  -c/w epoetin     ESRD   - per nephrology  - m, w, f     HTN - c/w norvasc     Asthma - stable      PENDING TEST RESULTS:   At the time of discharge the following  test results are still pending: none    FOLLOW UP APPOINTMENTS:    PCP    ADDITIONAL CARE RECOMMENDATIONS: as above    DIET: cardiac diet    ACTIVITY: activity as tolerated      DISCHARGE MEDICATIONS:     Medication List        CONTINUE taking these medications      amLODIPine 10 MG tablet  Commonly known as: NORVASC     Eliquis 2.5 MG Tabs tablet  Generic drug: apixaban     PhosLo 667 MG Caps capsule  Generic drug: calcium acetate            ASK your doctor about these medications      HYDROmorphone 2 MG tablet  Commonly known as: DILAUDID  Take 1 tablet by mouth every 4 hours as needed for Pain for up to 3 days. Max Daily Amount: 12 mg  Ask about: Should I take this medication?               Where to Get Your Medications        Information about where to get these medications is not yet available    Ask your nurse or doctor about these medications  HYDROmorphone  2 MG tablet           NOTIFY YOUR PHYSICIAN FOR ANY OF THE FOLLOWING:   Fever over 101 degrees for 24 hours.   Chest pain, shortness of breath, fever, chills, nausea, vomiting, diarrhea, change in mentation, falling, weakness, bleeding. Severe pain or pain not relieved by medications.  Or, any other signs or symptoms that you may have questions about.    DISPOSITION:   x Home With:   OT  PT  HH  RN       Long term SNF/Inpatient Rehab    Independent/assisted living    Hospice    Other:       PATIENT CONDITION AT DISCHARGE:     Functional status    Poor     Deconditioned    x Independent      Cognition    x Lucid     Forgetful     Dementia      Catheters/lines (plus indication)    Foley     PICC     PEG    x None      Code status    x Full code     DNR      PHYSICAL EXAMINATION AT DISCHARGE:    General : alert x 3, awake, no acute distress,   HEENT: PEERL, EOMI, moist mucus membrane, TM clear  Neck: supple, no JVD, no meningeal signs  Chest: Clear to auscultation bilaterally   CVS: S1 S2 heard, Capillary refill less than 2 seconds  Abd: soft/ Non tender, non  distended, BS physiological,   Ext: no clubbing, no cyanosis, no edema, brisk 2+ DP pulses  Neuro/Psych: pleasant mood and affect, CN 2-12 grossly intact, sensory grossly within normal limit, Strength 5/5 in all extremities, DTR 1+ x 4  Skin: warm     CHRONIC MEDICAL DIAGNOSES:      Greater than 38 minutes were spent with the patient on counseling and coordination of care    Signed:   Burnadette Peter, MD  05/22/2022  4:42 AM

## 2022-05-18 NOTE — Plan of Care (Signed)
Problem: Discharge Planning  Goal: Discharge to home or other facility with appropriate resources  Outcome: Progressing     Problem: Pain  Goal: Verbalizes/displays adequate comfort level or baseline comfort level  Outcome: Progressing     Problem: Safety - Adult  Goal: Free from fall injury  Outcome: Progressing

## 2022-05-18 NOTE — Care Coordination-Inpatient (Incomplete)
Transition of Care Plan:    RUR: 17%   Prior Level of Functioning: Independent   Disposition: Home   Follow up appointments: PCP   DME needed: No   Transportation at discharge: The pt reported that she did not need this cm to coordinate transport and she did not disclose the name of the provider.   IM/IMM Medicare/Tricare letter given: N/A   Emergency Contact: Branch,Corey (Other)  [phone]   Discharge Caregiver contacted prior to discharge? N/A   Care Conference needed? No

## 2022-05-18 NOTE — Plan of Care (Signed)
Problem: Discharge Planning  Goal: Discharge to home or other facility with appropriate resources  05/18/2022 0956 by Mignon Pine, RN  Outcome: [place] Resolved Met  05/18/2022 0021 by Posey Pronto, [place], RN  Outcome: Progressing     Problem: Pain  Goal: Verbalizes/displays adequate comfort level or baseline comfort level  05/18/2022 0956 by Mignon Pine, RN  Outcome: [place] Resolved Met  05/18/2022 0021 by Posey Pronto Noopur, RN  Outcome: Progressing     Problem: Safety - Adult  Goal: Free from fall injury  05/18/2022 0956 by Mignon Pine, RN  Outcome: [place] Resolved Met  05/18/2022 0021 by Barrett Henle, RN  Outcome: Progressing

## 2022-05-18 NOTE — Progress Notes (Signed)
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    Interval history / Subjective:   Follow up left renal hemorrhage  On anticoagulation now   Got dialysis last night  Assessment & Plan:     Left renal hemorrhage  -last dose of Eliquis 2/17, [place] held on admission  -evaluated by Urology, recommend IR embolization if hematuria is noted  -2/22 evaluated by vascular: hold [place] and restart when safe/feasible  -2/23 Hematology consulted & recommend hold [facility] for 48 hrs and restarting Eliquis at 2.5 mg BID if H&H remains stable  -Started on Eliquis 2/26    Hx of SVC Syndrome  SLE (per pt)  -diagnosed in 2019-2020, related to her previous perm cath  -Unsure if she has lupus coagulants, pt is a poor historian  -on long term oac w/ Eliquis  -lives in [place], outside records limited. Pt's surgical team manages SVC per her report  -per hematology, the diagnosis of antiphospholipid antibodies needs confirmation on 2 separate equations 12 weeks apart.    -pt will need a hematologist in [place] where she resides to discuss determination of long term anticoagulation, this has been discussed with pt  -hypercoaguable work up sent    Hyponatremia  -Management with dialysis    Acute blood loss anemia, due to left renal hemorrhage  Anemia of chronic disease  -Procrit per nephrology  -H&H stable  -Transfuse as needed for hemoglobin <7  -c/w epoetin    ESRD   - per nephrology  - m, w, f    HTN - c/w norvasc    Asthma - stable       Code status: full  Prophylaxis: SCDs    Plan: Discharge today  Care Plan discussed  with: Patient, nursing, attending  Anticipated Disposition: patient lives in [place] and follows up at [place]:              Social Determinants of Health     Tobacco Use: Low Risk  (05/07/2022)    Patient History     Smoking Tobacco Use: Never     Smokeless Tobacco Use: Never     Passive Exposure: Not on file   Alcohol Use: Not At Risk (05/07/2022)    AUDIT-C     Frequency of Alcohol Consumption: Never     Average Number of Drinks: Patient does not drink     Frequency of Binge Drinking: Never   Financial Resource Strain: Not on file   Food Insecurity: Not on file  Transportation Needs: Not on file   Physical Activity: Not on file   Stress: Not on file   Social Connections: Not on file   Intimate Partner Violence: Not on file   Depression: Not on file   Housing Stability: Not on file   Interpersonal Safety: Not on file   Utilities: Not on file       Review of Systems:   A comprehensive review of systems was negative.       Vital Signs:    Last 24hrs VS reviewed since prior progress note. Most recent are:  Vitals:    05/18/22 0750   BP: 118/69   Pulse: 72   Resp: 16   Temp: 98.8 F (37.1 C)   SpO2: 98%         Intake/Output Summary (Last 24 hours) at 05/18/2022 0910  Last data filed at 05/18/2022 0235  Gross per 24 hour   Intake 1002 ml   Output 4000 ml   Net -2998 ml          Physical Examination:     I had a face to face encounter with this patient and independently examined them on 05/18/2022 as outlined below:    General: Patient in no acute respiratory distress.  HEENT: Normocephalic, atraumatic, no otorrhea, no rhinorrhea, nares patent, PEERL, EOMI, moist mucus membranes.   Neck: Supple, no JVD, Trachea midline.  Hypervascular nonpulsatile chronic appearance across anterior neck and supraclavicular region  Respiratory/ Chest: Clear to auscultation bilaterally   CVS: Regular rate.  Regular rhythm.  Normal S1-S2.  Pansystolic blowing murmur  GI/ Abd: Soft.  Obese.  Nondistended.   Generalized tenderness to light palpation.  Voluntary guarding.  No rebound or rigidity.  Normoactive bowel sounds.   Musculoskeletal/Ext:   Nonpitting edema about lower extremities. Chronic joint deformity with large left foot bunion.  Neuro: Moves all extremities x 4.  Generalized weakness strength 4/5 bilateral upper/lower extremities.  No slurred speech.  No facial droop.  Sensation grossly intact.    Vascular/ Pulses: 2+ radial, 1+ dorsalis pedis pulse bilateral. Right arm AV fistula with palpable thrill.  Integument/ Skin: Warm, dry, without rashes or lesions           Data Review:    Review and/or order of clinical lab test  Review and/or order of tests in the radiology section of CPT  Review and/or order of tests in the medicine section of CPT      I have personally and independently reviewed all pertinent labs, diagnostic studies, imaging, and have provided independent interpretation of the same.     Labs:     Recent Labs     05/16/22  0830   HGB 8.0*   HCT 24.3*       Recent Labs     05/16/22  0830   NA 127*   K 5.1   CL 95*   CO2 25   BUN 49*   PHOS 5.4*       No results for input(s): "ALT", "TP", "ALB", "GLOB", "GGT", "AML" in the last 72 hours.    Invalid input(s): "SGOT", "GPT", "AP", "TBIL", "TBILI", "AMYP", "LPSE", "HLPSE"    No results for input(s): "INR", "APTT" in the last 72 hours.    Invalid input(s): "PTP"     No results for input(s): "TIBC", "FERR" in the last 72 hours.    Invalid input(s): "FE", "PSAT"     No results found for: "FOL", "RBCF"   No results for input(s): "  PH", "PCO2", "PO2" in the last 72 hours.  No results for input(s): "CPK" in the last 72 hours.    Invalid input(s): "CPKMB", "CKNDX", "TROIQ"  No results found for: "CHOL", "[place]", "CHLST", "CHOLV", "HDL", "HDLC", "LDL", "LDLC", "TGLX", "TRIGL"  No results found for: "GLUCPOC"  @LABUA$ @    Notes reviewed from all clinical/nonclinical/nursing services involved in patient's clinical care. Care coordination discussions were held  with appropriate clinical/nonclinical/ nursing providers based on care coordination needs.         Patients current active Medications were reviewed, considered, added and adjusted based on the clinical condition today.      Home Medications were reconciled to the best of my ability given all available resources at the time of admission. Route is PO if not otherwise noted.      Admission Status:[id number]:::1}      Medications Reviewed:     Current Facility-Administered Medications   Medication Dose Route Frequency    albumin human 25% IV solution 25 g  25 g IntraVENous PRN    apixaban (ELIQUIS) tablet 2.5 mg  2.5 mg Oral Daily    HYDROmorphone (DILAUDID) tablet 2 mg  2 mg Oral Q4H PRN    epoetin alfa-epbx (RETACRIT) injection 20,000 Units  20,000 Units SubCUTAneous Once per day on Mon Wed Fri    prochlorperazine (COMPAZINE) injection 10 mg  10 mg IntraVENous Q6H PRN    promethazine (PHENERGAN) tablet 25 mg  25 mg Oral Q6H PRN    sodium chloride flush 0.9 % injection 5-40 mL  5-40 mL IntraVENous 2 times per day    sodium chloride flush 0.9 % injection 5-40 mL  5-40 mL IntraVENous PRN    0.9 % sodium chloride infusion   IntraVENous PRN    ondansetron (ZOFRAN-ODT) disintegrating tablet 4 mg  4 mg Oral Q8H PRN    Or    ondansetron (ZOFRAN) injection 4 mg  4 mg IntraVENous Q6H PRN    polyethylene glycol (GLYCOLAX) packet 17 g  17 g Oral Daily PRN    amLODIPine (NORVASC) tablet 10 mg  10 mg Oral Daily    calcium acetate (PHOSLO) tablet 667 mg  667 mg Oral TID WC     ______________________________________________________________________  EXPECTED LENGTH OF STAY: 11  ACTUAL LENGTH OF STAY:          9                 Burnadette Peter, MD

## 2022-05-18 NOTE — Progress Notes (Addendum)
Discharge education was provided to patient. Patient verbalized understanding. Patient had no further questions. AVS was printed and given to patient. She is eager to discharge. Stated she knows what she is doing and wants out.     Patient was given the Hardscript for Medication on DC.

## 2022-05-18 NOTE — Progress Notes (Signed)
[facility]   [address], [place] [address]  Phone: [phone]   [phone]    [email or web address]     Nephrology Progress Note    Patient Name : Meredith Hawkins     DOB : 12/27/1985     MRN : [id number]  Date of Admission : 05/07/2022  Date of Servive : 05/18/22    CC:  Follow up for ESRD       Assessment and Plan   ESRD on HD   - dialyzes MWF in [facility] [place]  - access RAVF + bruit/ thrill  -Stable for DC from renal standpoint    Hyponatremia   -Advised fluid restriction    L renal hemorrhage  -Back on AC    HTN  SLE  SVC syndrome  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  Completed dialysis overnight.  Anticipated discharge this morning and is planning to go back to [place]  .  Review of Systems: Pertinent items are noted in HPI.    Current Medications:   Current Facility-Administered Medications   Medication Dose Route Frequency    apixaban (ELIQUIS) tablet 2.5 mg  2.5 mg Oral BID    albumin human 25% IV solution 25 g  25 g IntraVENous PRN    HYDROmorphone (DILAUDID) tablet 2 mg  2 mg Oral Q4H PRN    epoetin alfa-epbx (RETACRIT) injection 20,000 Units  20,000 Units SubCUTAneous Once per day on Mon Wed Fri    prochlorperazine (COMPAZINE) injection 10 mg  10 mg IntraVENous Q6H PRN    promethazine (PHENERGAN) tablet 25 mg  25 mg Oral Q6H PRN    sodium chloride flush 0.9 % injection 5-40 mL  5-40 mL IntraVENous 2 times per day    sodium chloride flush 0.9 % injection 5-40 mL  5-40 mL IntraVENous PRN    0.9 % sodium chloride infusion   IntraVENous PRN    ondansetron (ZOFRAN-ODT) disintegrating tablet 4 mg  4 mg Oral Q8H PRN    Or    ondansetron (ZOFRAN) injection 4 mg  4 mg IntraVENous Q6H PRN    polyethylene glycol (GLYCOLAX) packet 17 g  17 g Oral Daily PRN    amLODIPine (NORVASC) tablet 10 mg  10 mg Oral Daily    calcium acetate (PHOSLO) tablet 667 mg  667 mg Oral TID WC      Allergies   Allergen Reactions    Ace  Inhibitors Anaphylaxis     Pt states "all of these have closed my throat"    Acetaminophen Anaphylaxis     Pt states "all of these have closed my throat"    Codeine Anaphylaxis     Pt states "all of these have closed my throat"    Fentanyl Anaphylaxis    Hydrocodone Anaphylaxis     Pt states "all of these have closed my throat"    Nsaids Other (See Comments)     pcp states not to take due to kidneys     Oxycodone Anaphylaxis     Pt states "all of these have closed my throat"    Sulfa Antibiotics Anaphylaxis       Objective:  Vitals:    Vitals:    05/18/22 0232 05/18/22 0235 05/18/22 0750 05/18/22 1015   BP: 115/83 115/83 118/69    Pulse: 78 78 72    Resp:  14 16  Temp:  97.5 F (36.4 C) 98.8 F (37.1 C)    TempSrc:   Oral    SpO2:   98%    Weight:    77 kg (169 lb 12.1 oz)   Height:         Intake and Output:  No intake/output data recorded.  02/26 1901 - 02/28 0700  In: 1002 [P.O.:502]  Out: 4000     Physical Examination:  General: NAD,Conversant   Neck:  Supple, no mass  Resp:  no wheezing , normal respiratory effort  CV:  RRR, + murmur , trace B/L LE edema  GI:  Soft, NT, + BS, no HS megaly  Neurologic:  Non focal  Psych:             AAO x 3 appropriate affect   Skin:  No Rash  Dialysis Access : RUE AVF + bruit    []$     High complexity decision making was performed  []$     Patient is at high-risk of decompensation with multiple organ involvement    Lab Data Personally Reviewed: I have reviewed all the pertinent labs, microbiology data and radiology studies during assessment.    Labs:  Recent Labs     05/16/22  0830   NA 127*   K 5.1   CL 95*   CO2 25   GLUCOSE 82   BUN 49*   CREATININE 10.30*   CALCIUM 8.1*         Recent Labs     05/16/22  0830   HGB 8.0*   HCT 24.3*       No results for input(s): "TP", "ALB", "GLOB", "AML" in the last 72 hours.    Invalid input(s): "SGOT", "GPT", "AP", "TBIL", "AMYP", "LPSE", "LAC"    No results for input(s): "INR", "APTT" in the last 72 hours.    Invalid input(s): "PTP"      No results for input(s): "CPK", "CKMB", "TROPONINI" in the last 72 hours.    Invalid input(s): "B-NP"  Invalid input(s): "PHI", "PCO2I", "PO2I", "FIO2I"     Ventilator:       Microbiology:  No results found for: "SDES"  No components found for: "CULT"      I have reviewed the flowsheets.  Chart and Pertinent Notes have been reviewed.   No change in PMH ,family and social history from Consult note.      Sunday Shams, MD  [facility]

## 2022-05-18 NOTE — Progress Notes (Signed)
Bedside shift change report given to Gershon Mussel, Therapist, sports (Soil scientist) by [employer], Printmaker). Report included the following information Nurse Handoff Report, Index, ED Encounter Summary, ED SBAR, Adult Overview, Surgery Report, Intake/Output, MAR, Recent Results, Alarm Parameters, Quality Measures, and Neuro Assessment.

## 2022-05-19 LAB — PROTHROMBIN GENE MUTATION

## 2022-05-23 LAB — LUPUS ANTICOAGULANT PANEL
APTT: 30 s
BETA-2 GLYCOPROTEIN I, IGA 500591: <10 SAU
BETA-2 GLYCOPROTEIN I, IGG 500589: <10 SGU
BETA-2 GLYCOPROTEIN I, IGM 500593: <10 SMU
CARDIOLIPIN AB IGG,CLPN1: <10 [GPL'U]
CARDIOLIPIN AB, IGM,CLPN2: 11 [MPL'U]
DRVVT CONFIRM SECONDS: 40.3 s
DRVVT RATIO: 1.5 ratio — ABNORMAL HIGH
DRVVT SCREEN: 65.1 s — ABNORMAL HIGH
HEXAGONAL PHOSPHOLIPID NEUTRAL 500569: 12 s — ABNORMAL HIGH
INR: 1.1 ratio
Platelet Neutralization: 2.5 s
Protime: 11.7 s
Thrombin Time: 20.8 s

## 2022-05-26 LAB — FACTOR 5 LEIDEN

## 2022-06-07 NOTE — Discharge Summary (Signed)
Discharge Summary    Date of Admission: 05/21/2022 Name:  Katherine [name]   Date of Discharge:  06/07/2022 DOB:  April 19, 1985  Discharging Team:  Hospitalist Medicine MRN:  [id number]  Consultants:  Gastroenterology, Infectious Diseases, Nephrology, Pain, Urology  Primary Care Physician: Katherine Beck, NP    Discharge Diagnoses:     Renal infarction (CMS/HCC)    End-stage renal disease on hemodialysis (CMS/HCC)    Hematoma of kidney without rupture of capsule    SVC syndrome          Discharge Medications        .      albuterol-ipratropium 18-103 mcg/actuation inhaler  Inhale 2 puffs every 4 (four) hours as needed for wheezing or shortness of breath.  Commonly known as: COMBIVENT     amLODIPine 10 MG tablet  Take 1 tablet (10 mg total) by mouth daily.  Commonly known as: NORVASC     calcium acetate(phosphat bind) 667 mg capsule  Take 2 capsules (1,334 mg total) by mouth 3 (three) times a day with meals.  Commonly known as: PHOSLO     calcium carbonate 200 mg calcium (500 mg) chewable tablet  Chew 2 tablets (1,000 mg total) See Admin Instructions.  Commonly known as: TUMS     Eliquis 2.5 mg Tab tablet  Take 1 tablet (2.5 mg total) by mouth 2 (two) times a day.  Generic drug: apixaban     hydrALAZINE 25 MG tablet  Take 1 tablet (25 mg total) by mouth 3 (three) times a day.  Commonly known as: APRESOLINE     metoprolol succinate 25 MG 24 hr tablet  Take 1 tablet (25 mg total) by mouth daily.  Commonly known as: TOPROL-XL     pantoprazole 40 MG tablet  Take 1 tablet (40 mg total) by mouth daily.  Commonly known as: PROTONIX     promethazine 12.5 MG tablet  Take 1 tablet (12.5 mg total) by mouth every 6 (six) hours as needed for nausea or vomiting for up to 7 days.  Commonly known as: PHENERGAN            ASK your doctor about these medications      amoxicillin-clavulanate 500-125 mg per tablet  Take 1 tablet by mouth daily for 5 days.  Commonly known as: AUGMENTIN  Ask about: Should I take this  medication?            Other Discharge Instructions:   Disposition:  Home  Follow Up:  PCP  Activity:  As tolerated  Diet:  renal diet    Results:    Last Weight:  79.5 kg (175 lb 4.3 oz)  Lab Results   Component Value Date    WBC 3.7 06/06/2022    HGB 8.7 (L) 06/06/2022    HCT 26 (L) 06/06/2022    PLT 188 06/06/2022    NA 135 (L) 06/06/2022    K 4.3 06/06/2022    CL 99 06/06/2022    CO2 21 06/06/2022    BUN 33 (H) 06/06/2022    CREAT 10.21 (H) 06/06/2022    GLU 93 06/06/2022    CA 8.6 (L) 06/06/2022    PHOS 4.3 06/06/2022    ALB 3.6 06/06/2022    TP 6.7 06/03/2022    ALT 11 06/03/2022    AST 19 06/03/2022    BILIT 0.3 06/03/2022    LIPAS 36 06/03/2022    BNP 461 (H) 05/21/2022     Unresulted Labs  None          Radiology:  US Renal Bilateral    Result Date: 06/04/2022  IMPRESSION: Decreased size of the LEFT renal hematoma. No Doppler flow in the LEFT kidney. I discussed the results of this exam with Katherine [name] over the phone at 2:20 PM on June 04, 2022`.    [place] Emergent Breast Left    Result Date: 05/24/2022  IMPRESSION:  Findings highly concerning for left breast mastitis. No breast abscess is identified. Short term follow-up bilateral diagnostic mammogram and left breast ultrasound is recommended 8-10 weeks after completion of the clinical management, or sooner if clinically indicated. NOTE: Please note, breast ultrasound alone may not be sensitive for the detection of breast malignancy. Recommend clinical follow up to resolution. If patient symptoms/palpable area of concern do not resolve after short course of antibiotic therapy (ie: 7-10 days) and/or appropriate medical management, follow up diagnostic mammography and targeted ultrasound is indicated for complete evaluation.    ECG 12 lead;    Result Date: 05/22/2022  Sinus rhythm with 1st degree A-V block Possible Left atrial enlargement Borderline ECG When compared with ECG of 21-May-2022 22:50, No significant change Confirmed by Katherine Hough E. ([id number]) on  05/22/2022 6:27:15 AM    XR Chest 2 Views    Result Date: 05/22/2022  IMPRESSION: Cardiomegaly with pulmonary vascular congestion.    CT Abdomen Pelvis Renal Colic Without IV Contrast    Result Date: 05/22/2022  IMPRESSION: 1.  Moderate to large LEFT renal subcapsular hematoma. 2.  No hydronephrosis. 3.  No bowel obstruction or pneumoperitoneum. 4.  Additional findings as described above.    ECG 12 lead;    Result Date: 05/22/2022  Sinus rhythm with 1st degree A-V block Possible Left atrial enlargement Borderline ECG When compared with ECG of 07-Feb-2022 22:00, No significant change was found Confirmed by Katherine [name]. ([id number]) on 05/22/2022 2:02:28 AM    History of Present Illness:  Please see admission note for full details regarding the patient's presentation.  In brief, this 37 y.o. 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 Subsequent imaging revealed that the patient's left kidney is infarcted.     Hospital Course:       Left breast mastitis:  Ultrasound without abscess, positive for extensive soft tissue inflammation, dilated ducts and increased vascularity.   ID consulted; treated with IV unasyn and vancomycin; transitioned to po augmentin, however unable to tolerate this d/t nausea, vomiting.  Changed to Dicloxocillin +Flagyl on 3/10 and completed course on 3/13.      Acute pain with persistent nausea and vomiting:  PO dilaudid ordered d/t extensive allergy history and ESRD.  GI consult appreciated given persistence of abdominal pain and vomiting.  Symptoms persist after completion of antibiotics despite PPI.  Follow-up ultrasound of kidney shows infarction of the left kidney as a consequence of the embolization.  Per urology and vascular surgery, no  intervention is required at this time.      SLE:  on immunosuppressives when diagnosed, not recently, does not report symptoms of flare. Currently no rheumatologist, will need to establish locally.      Reported history Of APL syndrome:  on  Eliquis started in setting of SVC syndrome she says (has stent).  APL abs  found in chart from  last month showing low positive anticardiolipin IgM with negative beta-2 glycoprotein; labs repeated here showing anticardiolipin and Anti-beta2 Glycoprotein I Antibodies in normal range. ANA positive, with weakly positive anti-DNA DS Ab. Lupus anticoagulation test not useful with ongoing  eliquis use.  Recommend repeating tests 3 months from initial testing (around June) and continuing Eliquis until then.     ESRD with Hyperkalemia due to missed HD sessions:   per Nephrology, M/W/F dialysis outpatient and here.  Dismissed from [facility] dialysis; CM assisting in new outpatient HD chair. Perma cath tomorrow.     L renal hematoma status postembolization with resulting renal infarction:  recently hospitalized in [place] from 2/17-2/28 with spontaneous L renal hemorrhage with subcapsular hematoma.  After extensive review of records, there is no mention that IR embolization was performed.  However the patient endorses having this procedure and her imaging studies with a CT scan here on March 3 shows embolization coil in the left renal artery.  Follow-up ultrasound of kidney shows infarction of the left kidney as a consequence of the embolization.  Per urology and vascular surgery, no intervention is required at this time.  Eliquis restarted on discharge from [facility].  Remains on eliquis with stable H/H and persistent left flank pain.      Anemia of CKD:     Hb is stable at 8.7. Iron/epogen per Nephrology, goal Hgb 10-11.      History of SVC Syndrome: s/p stent.  Remains on eliquis. Per chart review plan was for 3 months of anticoagulation from 11/23 after angioplasty was performed.   Needs to follow-up with vascular surgery at [facility]     HTN: Continue amlodipine 10 mg daily, hydralazine.      Asthma: no signs of acute exacerbation.    The patient left AMA at 1220 without informing RN.    Shaune Pollack, MD    Portions of this record may have been dictated using voice recognition software.  Unintentional errors in spelling and vocabulary are possible and may sometimes remain uncorrected.

## 2022-06-24 NOTE — Progress Notes (Signed)
Nephrology Progress Note    Interval History  - HD on 4/4, 3.5L UF removed  - Anticipate discharge today      Intake/Output Summary (Last 24 hours) at 06/24/2022 1045  Last data filed at 06/24/2022 0800  Gross per 24 hour   Intake 1680 ml   Output 3500 ml   Net -1820 ml         Medications:  . amLODIPine  10 mg Oral Daily   . apixaban  2.5 mg Oral Q12H SCH   . calcium acetate(phosphat bind)  1,334 mg Oral TID CC   . epoetin alfa  4,000 Units Intravenous Dialysis   . hydrALAZINE  25 mg Oral TID   . pantoprazole  40 mg Oral Daily   . sennosides-docusate  2 tablet Oral BID       Physical Exam:  Temp:  [36.2 C (97.2 F)-36.6 C (97.8 F)] 36.2 C (97.2 F)  Heart Rate:  [75-82] 75  Resp:  [16-18] 17  BP: (107-135)/(68-89) 126/78   Temp (24hrs), Avg:36.3 C (97.4 F), Min:36.2 C (97.2 F), Max:36.6 C (97.8 F)    Weight: 79.8 kg (175 lb 14.8 oz)  Ht:    Wt:79.8 kg (175 lb 14.8 oz) UVO:ZDGU surface area is 1.93 meters squared.  Weight change:     GEN: Nontoxic appearing, no acute distress  HEENT: anicteric sclerae   CARDIAC:  no JVD, RRR without a rub  RESP: Normal work of breathing, lungs clear to auscultation bilaterally  ABD: Soft, non-distended, non-tender  EXT: No edema  Access: Right arm AVF     Data:  Recent Labs   Lab 06/18/22  1015   NA 135   K 4.6   CL 97*   CO2 23   BUN 56*   CREATININE 7.4*   CALCIUM 8.7   GLUCOSE 70     Lab Results   Component Value Date    WBC 3.5 06/18/2022    WBC 3.5 06/14/2022    HGB 8.5 (L) 06/18/2022    HGB 8.3 (L) 06/14/2022    HCT 26.5 (L) 06/18/2022    MCV 94 06/18/2022    PLT 144 (L) 06/18/2022       Assessment & Plan:   37 year old female with ESRD on via RUE AVF, HTN, SVC syndrome, recent L renal hematoma s/p embolization w/ resulting renal infarction who presents with nausea and vomiting and generalized malaise in setting of not receiving dialysis treatments since Monday during a [facility] hospitalization for left breast mastitis as she was dismissed from her original  outpatient dialysis center of [facility] and has not procured another outpatient dialysis chair.      ESKD on HD  - HD 4/6  - Nursing dialysate bath protocol  - Anticoagulation: none  - case manager working on outpatient HD and discharge arrangements   - Consent signed and in HD unit  - Please dose all medications for GFR < 10.  - Avoid morphine, demerol, magnesium-containing products, baclofen     Renal osteodystrophy  - Phos goal 3.5-5.5  - Check phosphorus on dialysis days  - Continue binders      Anemia  - Hgb below goal  - Ferritin 1,096 on 3/26  - TSAT 34 on 3/23  - Continue epo 4,000 units with HD     Access  - No active issues    Placement  - no accepting HD units in the [employer] [facility] or [facility]  Please page me directly at [phone] regarding questions about this patient. Thank you for involving [place] in the care of this patient.    [address] [place], [place]  06/24/2022  Nephrology  [phone]

## 2022-07-01 ENCOUNTER — Emergency Department (HOSPITAL_COMMUNITY): Payer: 59

## 2022-07-01 ENCOUNTER — Encounter (HOSPITAL_COMMUNITY): Payer: Self-pay | Admitting: *Deleted

## 2022-07-01 ENCOUNTER — Emergency Department (HOSPITAL_COMMUNITY)
Admission: EM | Admit: 2022-07-01 | Discharge: 2022-07-01 | Disposition: A | Discharge status: Home / Self Care | Payer: 59 | Attending: Emergency Medicine | Admitting: Emergency Medicine

## 2022-07-01 ENCOUNTER — Other Ambulatory Visit: Payer: Self-pay

## 2022-07-01 DIAGNOSIS — N186 End stage renal disease: Secondary | ICD-10-CM | POA: Insufficient documentation

## 2022-07-01 DIAGNOSIS — R112 Nausea with vomiting, unspecified: Secondary | ICD-10-CM | POA: Insufficient documentation

## 2022-07-01 DIAGNOSIS — Z79899 Other long term (current) drug therapy: Secondary | ICD-10-CM | POA: Insufficient documentation

## 2022-07-01 DIAGNOSIS — I120 Hypertensive chronic kidney disease with stage 5 chronic kidney disease or end stage renal disease: Secondary | ICD-10-CM | POA: Insufficient documentation

## 2022-07-01 DIAGNOSIS — E875 Hyperkalemia: Secondary | ICD-10-CM | POA: Diagnosis not present

## 2022-07-01 DIAGNOSIS — D631 Anemia in chronic kidney disease: Secondary | ICD-10-CM | POA: Diagnosis not present

## 2022-07-01 DIAGNOSIS — R600 Localized edema: Secondary | ICD-10-CM | POA: Diagnosis not present

## 2022-07-01 DIAGNOSIS — Z5329 Procedure and treatment not carried out because of patient's decision for other reasons: Secondary | ICD-10-CM | POA: Diagnosis not present

## 2022-07-01 DIAGNOSIS — Z91158 Patient's noncompliance with renal dialysis for other reason: Secondary | ICD-10-CM | POA: Diagnosis not present

## 2022-07-01 DIAGNOSIS — R109 Unspecified abdominal pain: Secondary | ICD-10-CM | POA: Diagnosis not present

## 2022-07-01 DIAGNOSIS — J45909 Unspecified asthma, uncomplicated: Secondary | ICD-10-CM | POA: Diagnosis not present

## 2022-07-01 DIAGNOSIS — E871 Hypo-osmolality and hyponatremia: Secondary | ICD-10-CM | POA: Diagnosis not present

## 2022-07-01 DIAGNOSIS — D72819 Decreased white blood cell count, unspecified: Secondary | ICD-10-CM | POA: Diagnosis not present

## 2022-07-01 LAB — CBC
HCT: 24.4 % — ABNORMAL LOW (ref 36.0–46.0)
Hemoglobin: 8 g/dL — ABNORMAL LOW (ref 12.0–15.0)
MCH: 29.7 pg (ref 26.0–34.0)
MCHC: 32.8 g/dL (ref 30.0–36.0)
MCV: 90.7 fL (ref 80.0–100.0)
Platelets: 162 10*3/uL (ref 150–400)
RBC: 2.69 MIL/uL — ABNORMAL LOW (ref 3.87–5.11)
RDW: 15.9 % — ABNORMAL HIGH (ref 11.5–15.5)
WBC: 3.1 10*3/uL — ABNORMAL LOW (ref 4.0–10.5)
nRBC: 0 % (ref 0.0–0.2)

## 2022-07-01 LAB — COMPREHENSIVE METABOLIC PANEL
ALT: 12 U/L (ref 0–44)
AST: 14 U/L — ABNORMAL LOW (ref 15–41)
Albumin: 3.8 g/dL (ref 3.5–5.0)
Alkaline Phosphatase: 107 U/L (ref 38–126)
Anion gap: 14 (ref 5–15)
BUN: 75 mg/dL — ABNORMAL HIGH (ref 6–20)
CO2: 21 mmol/L — ABNORMAL LOW (ref 22–32)
Calcium: 7.4 mg/dL — ABNORMAL LOW (ref 8.9–10.3)
Chloride: 94 mmol/L — ABNORMAL LOW (ref 98–111)
Creatinine, Ser: 11.87 mg/dL — ABNORMAL HIGH (ref 0.44–1.00)
GFR, Estimated: 4 mL/min — ABNORMAL LOW (ref >60)
Glucose, Bld: 80 mg/dL (ref 70–99)
Potassium: 6.8 mmol/L (ref 3.5–5.1)
Sodium: 129 mmol/L — ABNORMAL LOW (ref 135–145)
Total Bilirubin: 0.6 mg/dL (ref 0.3–1.2)
Total Protein: 7.5 g/dL (ref 6.5–8.1)

## 2022-07-01 LAB — I-STAT BETA HCG BLOOD, ED (MC, WL, AP ONLY): I-stat hCG, quantitative: <5 m[IU]/mL (ref <5)

## 2022-07-01 LAB — LIPASE, BLOOD: Lipase: 50 U/L (ref 11–51)

## 2022-07-01 MED ORDER — INSULIN ASPART 100 UNIT/ML IV SOLN
5.0000 [IU] | Freq: Once | INTRAVENOUS | Status: AC
  Administered 2022-07-01: 5 [IU] via INTRAVENOUS
  Filled 2022-07-01: qty 0.05

## 2022-07-01 MED ORDER — HYDROMORPHONE HCL 1 MG/ML IJ SOLN
1.0000 mg | Freq: Once | INTRAMUSCULAR | Status: AC
  Administered 2022-07-01: 1 mg via INTRAVENOUS
  Filled 2022-07-01: qty 1

## 2022-07-01 MED ORDER — SODIUM CHLORIDE 0.9 % IV SOLN
12.5000 mg | Freq: Four times a day (QID) | INTRAVENOUS | Status: DC | PRN
  Administered 2022-07-01: 12.5 mg via INTRAVENOUS
  Filled 2022-07-01: qty 12.5

## 2022-07-01 MED ORDER — SODIUM ZIRCONIUM CYCLOSILICATE 10 G PO PACK
10.0000 g | PACK | Freq: Once | ORAL | Status: AC
  Administered 2022-07-01: 10 g via ORAL
  Filled 2022-07-01: qty 1

## 2022-07-01 MED ORDER — ALBUTEROL SULFATE (2.5 MG/3ML) 0.083% IN NEBU
10.0000 mg | INHALATION_SOLUTION | Freq: Once | RESPIRATORY_TRACT | Status: AC
  Administered 2022-07-01: 10 mg via RESPIRATORY_TRACT
  Filled 2022-07-01: qty 12

## 2022-07-01 MED ORDER — DEXTROSE 50 % IV SOLN
1.0000 | Freq: Once | INTRAVENOUS | Status: AC
  Administered 2022-07-01: 50 mL via INTRAVENOUS
  Filled 2022-07-01: qty 50

## 2022-07-01 NOTE — ED Notes (Signed)
Pt on call light states she wants a new doctor as this current one does not seemed concerned about her at all. Pt notified pain meds requested not received. Pt sitting at bedside on home computer eating salad at present time in no acute distress

## 2022-07-01 NOTE — ED Triage Notes (Signed)
N/V and flank pain since Wed afternoon.

## 2022-07-01 NOTE — ED Notes (Signed)
Patient to nurses station demanding to speak to supervisor above CN head is to be transported to dialysis

## 2022-07-01 NOTE — ED Notes (Signed)
Report given to [facility] ED charge RN. 

## 2022-07-01 NOTE — ED Provider Notes (Signed)
[place] EMERGENCY DEPARTMENT AT [facility] Provider Note   CSN: [id number] Arrival date & time: 07/01/22  1219     History {Add pertinent medical, surgical, social history, OB history to HPI:1} Chief Complaint  Patient presents with   Emesis   Nausea   Flank Pain    Evelyn Bryant is a 37 y.o. female with past medical history significant for ESRD on dialysis, lupus, lupus nephritis, anemia of chronic disease, asthma, hypertension presents to the ED complaining of nausea, vomiting, and left-sided flank pain since Wednesday afternoon.  Patient was seen for same symptoms yesterday at [facility] ED, but did not stay for treatment.  She reports some shortness of breath and leg swelling today.  Patient attends dialysis T/Th/Sat with her last session on Tuesday.  Patient missed yesterday and this past Saturday.  Patient had sudden onset of left flank pain on Wednesday and states it reminds her of her prior renal hematoma.  Denies fever, chills, diarrhea, chest pain, abdominal pain, weakness, syncope, palpitations.         Home Medications Prior to Admission medications   Medication Sig Start Date End Date Taking? Authorizing Provider  amLODipine (NORVASC) 10 MG tablet Take 10 mg by mouth daily. 04/12/17   [provider]  calcium acetate (PHOSLO) 667 MG capsule Take 667 mg by mouth 3 (three) times daily with meals.    [provider]  HYDROmorphone (DILAUDID) 2 MG tablet Take 0.5 tablets (1 mg total) by mouth every 4 (four) hours as needed for severe pain. 09/23/17   Jacalyn Lefevre, MD  ondansetron (ZOFRAN-ODT) 4 MG disintegrating tablet Take 1 tablet (4 mg total) by mouth every 8 (eight) hours as needed for up to 15 doses for nausea or vomiting. 11/30/21   Terald Sleeper, MD  promethazine (PHENERGAN) 25 MG tablet Take 1 tablet (25 mg total) by mouth every 6 (six) hours as needed for nausea or vomiting. 09/23/17   Jacalyn Lefevre, MD      Allergies     Acetaminophen, Codeine, Enalapril, Fentanyl, Hydrocodone, Oxycodone, Sulfa antibiotics, and Nsaids    Review of Systems   Review of Systems  Constitutional:  Negative for chills and fever.  Respiratory:  Positive for shortness of breath.   Cardiovascular:  Positive for leg swelling. Negative for chest pain and palpitations.  Gastrointestinal:  Positive for nausea and vomiting. Negative for abdominal pain and diarrhea.  Genitourinary:  Positive for flank pain.  Neurological:  Negative for syncope and weakness.    Physical Exam Updated Vital Signs BP (!) 161/107   Pulse 78   Temp 98.1 F (36.7 C) (Oral)   Resp 20   Ht  (1.676 m)   SpO2 100%   BMI 27.58 kg/m  Physical Exam Vitals and nursing note reviewed.  Constitutional:      General: She is not in acute distress.    Appearance: Normal appearance. She is not ill-appearing or diaphoretic.  Cardiovascular:     Rate and Rhythm: Normal rate and regular rhythm.     Heart sounds: Normal heart sounds.     Arteriovenous access: Right arteriovenous access is present. Pulmonary:     Effort: Pulmonary effort is normal.  Abdominal:     General: Abdomen is flat.     Palpations: Abdomen is soft.     Tenderness: There is no abdominal tenderness.  Musculoskeletal:     Right lower leg: 2+ Edema present.     Left lower leg: 2+ Edema present.  Skin:    General: Skin is warm and dry.     Capillary Refill: Capillary refill takes less than 2 seconds.  Neurological:     Mental Status: She is alert and oriented to person, place, and time. Mental status is at baseline.  Psychiatric:        Mood and Affect: Mood normal.        Behavior: Behavior normal.     ED Results / Procedures / Treatments   Labs (all labs ordered are listed, but only abnormal results are displayed) Labs Reviewed  COMPREHENSIVE METABOLIC PANEL - Abnormal; Notable for the following components:      Result Value   Sodium 129 (*)    Potassium 6.8 (*)     Chloride 94 (*)    CO2 21 (*)    BUN 75 (*)    Creatinine, Ser 11.87 (*)    Calcium 7.4 (*)    AST 14 (*)    GFR, Estimated 4 (*)    All other components within normal limits  CBC - Abnormal; Notable for the following components:   WBC 3.1 (*)    RBC 2.69 (*)    Hemoglobin 8.0 (*)    HCT 24.4 (*)    RDW 15.9 (*)    All other components within normal limits  LIPASE, BLOOD  URINALYSIS, ROUTINE W REFLEX MICROSCOPIC  I-STAT BETA HCG BLOOD, ED ([facility], [facility], [facility] ONLY)    EKG EKG Interpretation  Date/Time:  Friday July 01 2022 13:55:16 EDT Ventricular Rate:  77 PR Interval:  218 QRS Duration: 108 QT Interval:  413 QTC Calculation: 468 R Axis:   78 Text Interpretation: Sinus rhythm Prolonged PR interval similar to prior, no stemi Confirmed by Tanda Rockers ([id number]) on 07/01/2022 3:18:26 PM  Radiology DG Chest Portable 1 View  Result Date: 07/01/2022 CLINICAL DATA:  Short of breath.  Nausea, vomiting and flank pain. EXAM: PORTABLE CHEST 1 VIEW COMPARISON:  09/23/2017. FINDINGS: Cardiac silhouette normal in size. No mediastinal or hilar masses. Right subclavian vascular stent, new since the prior exam. Clear lungs.  No pleural effusion or pneumothorax. Surgical vascular clips along the right lateral breast. Medial right upper extremity vascular stent. IMPRESSION: No acute cardiopulmonary disease. Electronically Signed   By: Amie Portland M.D.   On: 07/01/2022 14:51    Procedures Procedures  {Document cardiac monitor, telemetry assessment procedure when appropriate:1}  Medications Ordered in ED Medications  promethazine (PHENERGAN) 12.5 mg in sodium chloride 0.9 % 50 mL IVPB (0 mg Intravenous Paused 07/01/22 1523)  insulin aspart (novoLOG) injection 5 Units (has no administration in time range)    And  dextrose 50 % solution 50 mL (has no administration in time range)  albuterol (PROVENTIL) (2.5 MG/3ML) 0.083% nebulizer solution 10 mg (has no administration in time range)  HYDROmorphone  (DILAUDID) injection 1 mg (1 mg Intravenous Given 07/01/22 1501)  sodium zirconium cyclosilicate (LOKELMA) packet 10 g (10 g Oral Given 07/01/22 1532)    ED Course/ Medical Decision Making/ A&P   {   Click here for ABCD2, HEART and other calculatorsREFRESH Note before signing :1}                          Medical Decision Making Amount and/or Complexity of Data Reviewed Labs: ordered. Radiology: ordered.  Risk OTC drugs. Prescription drug management.   This patient presents to the ED with chief complaint(s) of *** with pertinent past medical history of ***.  The complaint involves an extensive differential diagnosis and also carries with it a high risk of complications and morbidity.    The differential diagnosis includes ***   The initial plan is to ***  Additional history obtained: Additional history obtained from {additional history:26846} Records reviewed {records:26847}  Initial Assessment:   ***  Independent ECG/labs interpretation:  The following labs were independently interpreted:  ***  Independent visualization and interpretation of imaging: I independently visualized the following imaging with scope of interpretation limited to determining acute life threatening conditions related to emergency care: ***, which revealed ***  Consultations Obtained: I requested consultation with on-call nephrologist and discussed patient case with Dr. Marisue Humble who recommends patient to be transferred to [facility] for emergent dialysis.  Treatment and Reassessment: Patient was given [facility] for hyperkalemia.  Per Dr. Lily Lovings request, she was also given albuterol and insulin/dextrose while awaiting transfer to [facility] for dialysis.    Disposition:   Patient to be transferred to [facility] for emergent dialysis.  Patient will go via [facility].  Dr. Rodena Medin will be accepting provider.    {Document critical care time when appropriate:1} {Document review of labs and  clinical decision tools ie heart score, Chads2Vasc2 etc:1}  {Document your independent review of radiology images, and any outside records:1} {Document your discussion with family members, caretakers, and with consultants:1} {Document social determinants of health affecting pt's care:1} {Document your decision making why or why not admission, treatments were needed:1} Final Clinical Impression(s) / ED Diagnoses Final diagnoses:  None    Rx / DC Orders ED Discharge Orders     None

## 2022-07-01 NOTE — ED Notes (Signed)
Assumed of patient sleeping in hall bed who was transferred from [facility] for dialysis. Pt was at [facility] prior to [facility] long and left AMA. Pt states everyone is just ignoring me. Pt c/o right sided flank pain x 1 week missed dialysis x 2 days and headache. Pt wants pain and nausea meds but was just given phenergan and is lethargic at present time. Pt has fistula in right upper arm + thrill/bruite. Pt slightly htn at 155/101 all other vs wnl

## 2022-07-01 NOTE — ED Notes (Signed)
Pt to nurses station demands iv be removed stating this is a racist hospital because you all dont have no black doctor I can see now. Pt iv removed cath intact pt dresses and walks out refusing further vital signs

## 2022-07-01 NOTE — ED Provider Notes (Signed)
Patient has been waiting for dialysis.  I was notified by nursing staff that patient left yelling down the hall that she did not want to wait any longer and felt that this was a racist hospital.  I did not have the opportunity to discuss this further with the patient as she was already gone.   Rolan Bucco, MD 07/01/22 2337

## 2022-07-01 NOTE — ED Notes (Signed)
Patient has refused other vital signs at this time

## 2022-07-01 NOTE — ED Notes (Signed)
[facility] called by Licensed conveyancer. Pt on transport list

## 2022-07-01 NOTE — ED Triage Notes (Signed)
Pt BIB EMS from [facility]. Per EMS, pt has had increasing right flank pain over the last several days. Pt was seen at another hospital prior to [facility] and left AMA because she felt she was being treated properly. Pt ended up missing dialysis. Flank pain persists. A/Ox4. VSS.

## 2022-07-01 NOTE — ED Notes (Signed)
Pt refusing vitals at this time.

## 2022-07-01 NOTE — ED Notes (Signed)
Pts IV occluded. IV team brought down and could not find a suitable spot that the pt would allow them to attempt an IV. Pt became agitated when IV team wanted to try a previously attempted IV location. This RN explained to pt that that was the only suitable spot available, pt became extremely rude and accused the staff of racism. RN informed pt they let the MD know of their refusal.

## 2022-07-01 NOTE — Consult Note (Signed)
Evelyn Bryant Admit Date: 07/01/2022 07/01/2022 Evelyn Bryant Requesting Physician:  Fredderick Phenix MD  Reason for Consult:  ESRD, Hyperkalemia HPI:  24F ESRD THS in [place] with [facility] presented to [facility], ED earlier today after missing dialysis and found to have hyperkalemia with a potassium of 6.8.  Thus far treated with Lokelma, insulin/dextrose, albuterol.  She was seen yesterday in the [facility] emergency room.  Notes have been reviewed.  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    She had missed least 2 outpatient treatments prior to her presentation yesterday at [facility].  It appears that she left AMA before receiving dialysis yesterday and her potassium was 5.7.  She was seen by nephrology there.  In interview with the patient she is defiant and somewhat angry.  She starts her conversation asking for pain medicines and nausea medicines.  She declines to tell me the reason that she is come to [place].  It sounds like it is connected to family matters.  She has a right upper extremity AV fistula with no concerns.  Chest x-ray is clear without active cardiopulmonary disease.  Hemoglobin is 8.0 it was 8.7 yesterday.  Imaging yesterday included a CT of the abdomen and pelvis with contrast demonstrating an evolving and decreased in size left subcapsular renal hematoma without evidence of hydronephrosis.  EKG with normal sinus rhythm.  Some peaked T's in leads II, III, V4, V5  ROS Balance of 12 systems is negative w/ exceptions as above  PMH  Past Medical History:  Diagnosis Date   Kidney failure    Lupus    PSH History reviewed. No pertinent surgical history. FH History reviewed. No pertinent family history. SH  reports that she has never smoked. She has never used smokeless tobacco. She  reports that she does not drink alcohol and does not use drugs. Allergies  Allergies  Allergen Reactions   Acetaminophen Anaphylaxis   Codeine Anaphylaxis   Enalapril Anaphylaxis   Fentanyl Anaphylaxis   Hydrocodone Anaphylaxis   Oxycodone Anaphylaxis   Sulfa Antibiotics Anaphylaxis and Other (See Comments)    Other reaction(s): Anaphylaxis   Nsaids     Not an actual allergy, pt cannot take as a dialysis pt   Home medications Prior to Admission medications   Medication Sig Start Date End Date Taking? Authorizing Provider  amLODipine (NORVASC) 10 MG tablet Take 10 mg by mouth daily. 04/12/17   [provider]  calcium acetate (PHOSLO) 667 MG capsule Take 667 mg by mouth 3 (three) times daily with meals.    [provider]  HYDROmorphone (DILAUDID) 2 MG tablet Take 0.5 tablets (1 mg total) by mouth every 4 (four) hours as needed for severe pain. 09/23/17   Jacalyn Lefevre, MD  ondansetron (ZOFRAN-ODT) 4 MG disintegrating tablet Take 1 tablet (4 mg total) by mouth every 8 (eight) hours as needed for up to 15 doses for nausea or vomiting. 11/30/21   Terald Sleeper, MD  promethazine (PHENERGAN) 25 MG tablet Take 1 tablet (25 mg total) by mouth every 6 (six) hours as needed for nausea or vomiting. 09/23/17   Jacalyn Lefevre, MD    Current Medications Scheduled Meds: Continuous Infusions:  promethazine (PHENERGAN) injection (IM or IVPB) Stopped (07/01/22 1915)   PRN Meds:.promethazine (PHENERGAN) injection (IM or IVPB)  CBC Recent  Labs  Lab 07/01/22 1420  WBC 3.1*  HGB 8.0*  HCT 24.4*  MCV 90.7  PLT 162   Basic Metabolic Panel Recent Labs  Lab 07/01/22 1420  NA 129*  K 6.8*  CL 94*  CO2 21*  GLUCOSE 80  BUN 75*  CREATININE 11.87*  CALCIUM 7.4*    Physical Exam  Blood pressure (!) 155/101, pulse 91, temperature (!) 97.5 F (36.4 C), temperature source Oral, resp. rate 15, height  (1.676 m), SpO2 100 %. GEN: NAD, sitting at the edge of the  bed ENT: NCAT EYES: EOMI CV: Regular, systolic murmur noted PULM: Clear bilaterally, normal work of breathing ABD: Soft, nontender SKIN: No rashes or lesions EXT: No peripheral edema VASCULAR: Bruit and thrill present of right upper arm AV fistula NEURO: Nonfocal, CN II through XII grossly intact  Assessment 79F ESRD with Hyperkalemia after missing HD several times.   ESRD on HD, [facility] in [place], [place] AVF Hyperkalemia, moderate with EKG changes Anemia Recent L subcapsular hematoma, improving it appears HTN SLE, hx/o LN, APLA and SVC syndrome Hypocalcemia, ASx mild Mild hyponatremia  Plan HD tonight, 2K, 2-3L UF, AVF, no heparin.   No indications for admission, will return to ED post HD for reassessment and disposition   Evelyn Bryant  07/01/2022, 8:30 PM

## 2022-07-01 NOTE — ED Provider Notes (Signed)
Patient presents from [facility] with left flank pain.  She has been missing dialysis recently.  She was noted to be hyperkalemic and in need of dialysis.  There is no evidence of respiratory symptoms.  Chest x-ray does not show any fluid overload.  She does have a known history of a left kidney hematoma.  She had a CT of her abdomen pelvis yesterday which showed improvement of the left kidney hematoma.  Her pain seems to be consistent with the pain it has been ongoing with this hematoma.  Will consult nephrology for dialysis.   Rolan Bucco, MD 07/01/22 1840

## 2022-07-01 NOTE — ED Notes (Signed)
Pt eating snacks despite request by the RN to avoid PO intake while she is still vomiting.

## 2022-07-03 NOTE — Progress Notes (Signed)
Sent message to hospitalist PA on call regarding IV Phenergan and IV Dilaudid/Morphine.  Received message that admitting doctor ordered po Dilaudid and Phenergan and since patient was just admitted, meds wouldn't be changed at this time.  Nurse informed patient and asked patient if she would like the oral Dilaudid and Phenergan.  Patient stated she did not.  Nurse asked if she would like something to drink and patient stated Ginger ale which was given to her.

## 2022-07-03 NOTE — H&P (Signed)
Admission Note (H&P)    Admitting Service:  Hospitalist Medicine  Primary Care Physician:  Elby Beck, NP    Chief Complaint:  left flank pain     HPI:  This 37 y.o. 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  Patient mentions that she does have intermittent nausea vomiting and left flank pain, which have however worsened since Wednesday.  To be noted, patient has multiple ER visits in various cities such as [facility] [place], [address] [facility], [facility] [place] and now [name] for similar complaints mostly including left flank pain associate with nausea vomiting and missing hemodialysis session due to transportation issues.  Review of previous charts revealed that case management have tried to the best ability to arrange for transportation to her hemodialysis center in [place].  Patient does endorse left flank pain 7 out of 10, dull aching, pain radiating to front.  Patient does not take any pain medicine at home.  PDMP  aware checked and there is no opiate prescription in last few months, however patient does have frequent ER visits and hospital stay in past few months needing IV pain medicine especially Dilaudid and IV Phenergan.    In ER, patient's vitals are grossly stable.  Labs reveal potassium 6.8, creatinine 14, bicarb 18.  Lactic acid normal.  Lipase normal at 50.  LFTs within normal limits.  INR 1.2.  CBC shows white cell count 3.4, hemoglobin 8.1, platelet 151 around her baseline.    CT of  abdomen pelvis did not show any acute process, did show diffuse increased density to liver seen with amiodarone therapy, however patient does not seem to be on amiodarone.  Left renal hematoma size significantly decreased.  Chest x-ray portable does not show any acute process.    EKG shows normal sinus rhythm with prolonged QT interval around 500 without any significant T wave abnormality for hyperkalemia    In ER, patient received treatment for hyperkalemia in the form of IV calcium gluconate, dextrose and IV insulin, albuterol, IV bicarbonate and Lokelma.    At the time of examination, patient looks comfortable in spite of citing abdominal pain    Past Surgical History:   Procedure Laterality Date   . hematoma removal     . INTERVENTIONAL RADIOLOGY VASCULAR  04/25/2019   . removal chest port       Prior to Admission medications        albuterol-ipratropium (COMBIVENT) 18-103 mcg/actuation inhaler Inhale 2 puffs every 4 (four) hours as needed for wheezing or shortness of breath.   amLODIPine (NORVASC) 10 MG tablet Take 1 tablet (10 mg total) by mouth daily.   apixaban (ELIQUIS) 2.5 mg Tab tablet Take 1 tablet (2.5 mg total) by mouth 2 (two) times a day.   calcium acetate,phosphat bind, (PHOSLO) 667 mg capsule Take 2 capsules (1,334 mg total) by mouth 3 (three) times a day with meals.   calcium carbonate (TUMS) 200 mg calcium (500 mg) chewable tablet Chew 2 tablets (1,000 mg total) See Admin Instructions.   hydrALAZINE (APRESOLINE) 25 MG tablet Take 1 tablet (25 mg total) by  mouth 3 (three) times a day.   metoprolol succinate (TOPROL-XL) 25 MG 24 hr tablet Take 1 tablet (25 mg total) by mouth daily.   pantoprazole (PROTONIX) 40 MG tablet Take 1 tablet (40 mg total) by mouth daily.     Allergies:    Allergies   Allergen Reactions   . Ace Inhibitors Anaphylaxis   . Acetaminophen Anaphylaxis   . Enalapril Maleate Anaphylaxis   . Fentanyl Anaphylaxis     Tolerates dilaudid     . Hydrocodone Anaphylaxis     Pt has had  morphine in the past, states it makes her nauseous   . Oxycodone Anaphylaxis   . Oxycodone-Acetaminophen Anaphylaxis   . Sulfa (Sulfonamide Antibiotics) Anaphylaxis and Other (See Comments)     Other reaction(s): Anaphylaxis   . Sulfamethoxazole-Trimethoprim Anaphylaxis   . Tylenol-Codeine Solution Anaphylaxis   . Nsaids (Non-Steroidal Anti-Inflammatory Drug) Rash     Lupus patient; states can take phenergan  Not an actual allergy, pt cannot take as a dialysis pt       Social History:  The patient  reports that she has never smoked. She has never used smokeless tobacco. She reports that she does not drink alcohol and does not use drugs.    Family History:  family history includes Diabetes in her father and mother.    Code Status:  Full Code    Review of Systems:  All other systems were reviewed and found to be negative except as noted above.       Physical Examination:  VITAL SIGNS: BP 139/78 (BP Location: Left upper arm, BP Position: Sitting, Cuff Size: Adult (Navy Blue))   Pulse 84   Temp 98 F (36.7 C) (Oral)   Resp 17   Ht 1.676 m (5\' 6" )   Wt 81 kg (178 lb 9.2 oz)   LMP 04/25/2022   SpO2 99%   BMI 28.82 kg/m      CONSTITUTIONAL: Well-appearing female in mild  distress.   HEENT: Pupils equal, round, and reactive to light bilaterally. Extraocular movements intact. Conjunctivae clear, sclerae anicteric. Oropharynx clear, mucous membranes moist.  NECK: Neck supple, no obvious masses or thyromegaly.  CARDIOVASCULAR: Heart rhythm regular, normal S1 and S2. Distal pulses equal bilaterally. No jugular venous distention. No peripheral edema.  RESPIRATORY: Chest clear to auscultation bilaterally, normal work of breathing.  ABDOMEN: Abdomen soft, nontender, with normoactive bowel sounds.   MUSCULOSKELETAL: Extremities warm and well perfused, no tenderness, normal muscle tone.   SKIN: No apparent skin rashes or lesions.  NEUROLOGIC: Normal speech, no focal findings. Mental status alert and oriented.  PSYCHIATRIC:  Mood and affect normal.    Left flank pain on palpation     Laboratory Data:    Recent Results (from the past 24 hour(s))   Urinalysis, Point of Care    Collection Time: 07/02/22  3:53 PM   Result Value Ref Range    Urine Color, Point of Care Yellow Lt Yellow or Yellow    Urine Clarity, Point of Care Clear Clear    Urine Glucose, Point of Care Negative Negative    Urine Bilirubin, Point of Care Negative Negative    Urine Ketones, Point of Care Negative Negative    Urine Specific Gravity, Point of Care 1.020 1.003 - 1.035    Urine Blood, Point of Care Negative Negative    Urine pH, Point of Care 7.5 5.0 - 8.0    Urine Protein, Point of Care 2+ (A) Negative    Urine  Urobilinogen, Point of Care 0.2 0.2 - 1.0    Urine Nitrite, Point of Care Negative Negative    Urine Leukocyte Esterase, Point of Care Negative Negative   Pregnancy, Urine, Point of Care    Collection Time: 07/02/22  3:53 PM   Result Value Ref Range    Pregnancy, Urine, Point of Care Negative    CBC with Differential    Collection Time: 07/02/22  4:38 PM   Result Value Ref Range    Differential Percent Diff %     Differential Absolute Diff Absolute     WBC 3.4 (L) 3.6 - 11.2 K/uL    RBC 2.60 (L) 3.63 - 4.92 M/uL    Hemoglobin 8.1 (L) 10.9 - 14.3 g/dL    Hematocrit 24 (L) 31 - 42 %    Mean Cell Volume 92 74 - 96 fL    Mean Cell Hemoglobin 31 24 - 33 pg    Mean Cell Hemoglobin Concentration 34 33 - 36 g/dL    RDW 09.8 11.9 - 14.7 %    Platelet Count 151 150 - 450 K/uL    Mean Platelet Volume 8.1 7.5 - 11.2 fL    Neutrophils 55 43 - 77 %    Lymphocytes 22 16 - 44 %    Monocytes 14 (H) 5 - 13 %    Eosinophils 7 1 - 8 %    Basophils 2 (H) 0 - 1 %    Neutrophils Absolute 1.9 1.8 - 7.8 K/uL    Lymphocytes Absolute 0.7 (L) 1.0 - 3.0 K/uL    Monocytes Absolute 0.5 0.3 - 1.0 K/uL    Eosinophils Absolute 0.2 0.0 - 0.5 K/uL    Basophils Absolute 0.1 0.0 - 0.1 K/uL    Nucleated RBCS 0 /100 WBC   CMP    Collection Time: 07/02/22  4:38 PM   Result Value Ref Range     Sodium See Text 136 - 145 mmol/L    Potassium See Text 3.5 - 5.1 mmol/L    Chloride See Text 99 - 108 mmol/L    CO2 See Text 21 - 31 mmol/L    BUN See Text 7 - 25 mg/dL    Creatinine See Text 0.51 - 1.00 mg/dL    Glucose, Random See Text 70 - 199 mg/dL    Calcium, Total See Text 8.8 - 10.6 mg/dL    Osmolality (calculated) See Text 270 - 295 mOsm/kg    Anion Gap See Text 3 - 11    Albumin See Text 3.5 - 5.7 g/dL    Bilirubin, Total See Text 0.3 - 1.0 mg/dL    Alkaline Phosphatase See Text 34 - 104 IU/L    ALT See Text 7 - 52 IU/L    AST See Text 13 - 39 IU/L    Protein, Total See Text 6.4 - 8.9 g/dL    Albumin/Globulin Ratio See Text 1.2 - 2.3   Lipase    Collection Time: 07/02/22  4:38 PM   Result Value Ref Range    Lipase See Text 8 - 82 U/L   Magnesium Level    Collection Time: 07/02/22  4:38 PM   Result Value Ref Range    Magnesium See Text 1.6 - 2.7 mg/dL   Phosphorus    Collection Time: 07/02/22  4:38 PM   Result Value Ref Range    Phosphorus See Text 2.5 - 5.0 mg/dL   PT/INR    Collection Time: 07/02/22  4:38 PM  Result Value Ref Range    PT 13.8 (H) 9.9 - 12.7 sec    INR 1.21 <1.30   eGFR (CKD-EPI)    Collection Time: 07/02/22  4:38 PM   Result Value Ref Range    eGFR See Text mL/min/1.44m2   CMP    Collection Time: 07/02/22  5:35 PM   Result Value Ref Range    Sodium 135 (L) 136 - 145 mmol/L    Potassium 6.8 (HH) 3.5 - 5.1 mmol/L    Chloride 97 (L) 99 - 108 mmol/L    CO2 18 (L) 21 - 31 mmol/L    BUN 105 (H) 7 - 25 mg/dL    Creatinine 16.10 (H) 0.51 - 1.00 mg/dL    Glucose, Random 62 (L) 70 - 199 mg/dL    Calcium, Total 6.8 (L) 8.8 - 10.6 mg/dL    Osmolality (calculated) 301 (H) 270 - 295 mOsm/kg    Anion Gap 20 (H) 3 - 11    Albumin 4.0 3.5 - 5.7 g/dL    Bilirubin, Total 0.3 0.3 - 1.0 mg/dL    Alkaline Phosphatase 116 (H) 34 - 104 IU/L    ALT 7 7 - 52 IU/L    AST 11 (L) 13 - 39 IU/L    Protein, Total 7.1 6.4 - 8.9 g/dL    Albumin/Globulin Ratio 1.3 1.2 - 2.3   Lipase    Collection Time: 07/02/22  5:35 PM    Result Value Ref Range    Lipase 50 8 - 82 U/L   Magnesium Level    Collection Time: 07/02/22  5:35 PM   Result Value Ref Range    Magnesium 2.2 1.6 - 2.7 mg/dL   Phosphorus    Collection Time: 07/02/22  5:35 PM   Result Value Ref Range    Phosphorus 6.0 (H) 2.5 - 5.0 mg/dL   eGFR (CKD-EPI)    Collection Time: 07/02/22  5:35 PM   Result Value Ref Range    eGFR 3 (A) mL/min/1.49m2   Lactic Acid    Collection Time: 07/02/22 10:25 PM   Result Value Ref Range    Lactic Acid 0.7 0.5 - 2.0 mmol/L     Radiographic Data/ECG:  CT Abdomen Pelvis Without Contrast    Result Date: 07/02/2022  IMPRESSION: 1.  No acute findings in the abdomen or pelvis. 2.  Significant decrease in size of left renal hematomas. 3.  Diffuse increased density to the liver which can be seen with amiodarone therapy. 4.  Renal osteodystrophy.    ECG 12 lead;    Result Date: 07/02/2022  Normal sinus rhythm Possible Left atrial enlargement Prolonged QT interval or tu fusion, consider myocardial disease, electrolyte imbalance, or drug effects Abnormal ECG When compared with ECG of 22-May-2022 01:00, No significant change was found Confirmed by RUTZ, ADAM ([id number]) on 07/02/2022 9:04:29 PM    CT Abdomen Pelvis Without Contrast    Result Date: 06/25/2022  -Interval enlargement of the lower pole of the left kidney, likely representing hematoma given the clinical history. Superimposed infection cannot be excluded via imaging. Comparison with more recent prior imaging is recommended to evaluate whether or not this presumed hematoma is enlarging or resolving. - Additional chronic and incidental findings, as above.    XR Chest Portable    Result Date: 06/25/2022  Findings concerning for mild pulmonary edema.    US Renal Bilateral    Result Date: 06/04/2022  IMPRESSION: Decreased size of the LEFT renal hematoma. No Doppler flow in  the LEFT kidney. I discussed the results of this exam with [name] over the phone at 2:20 PM on June 04, 2022`.      I have reviewed this  patient's diagnostic studies including lab data and radiographic data.    Hospital Problems:    Flank pain    Assessment/Plan: This 37 y.o. 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      #1 chronic left flank pain due to left renal hematoma as per patient-  History of left renal hemorrhage requiring embolization  Nonspecific nausea and vomiting    Repeat CT scan shows decrease in size of hematoma.  To be noted patient has multiple CT scans over the past few months.  Patient does exhibit opiate seeking behavior asking for IV Dilaudid and IV Phenergan during each of her ER visit.  I am concerned about her opiate seeking behavior and possible opiate dependence.  Patient currently does not have any opiate prescriptions for home.  Patient is comfortable using Dilaudid IV.  I have currently ordered oral Dilaudid, will discuss with her in the morning if she is amenable to oral tramadol.  Patient is allergic to Tylenol and NSAIDs as per documentation  Oral Phenergan ordered for nausea and vomiting, avoid IV Phenergan.  Alternatively can use per rectal Phenergan, so that she can use the same at home as well if needed    2.  End-stage renal disease-noncompliance with hemodialysis citing transportation issues for past 3 months.    Patient is unable to tell me the exact reason for transportation issues.  Case management consulted to assist in getting more details.  Nephrology consulted, hemodialysis planned for tomorrow.  Continue home medications PhosLo 2  tablets 3 times daily.      3.  History of SVC syndrome-  Hematology consulted few months ago at outside hospital recommended continuing Eliquis 2.5 mg p.o. twice daily.  Follow-up with hematology as outpatient for further workup of antiphospholipid syndrome to be ruled out.    4.  Uncontrolled hypertension-  Resume home dose of amlodipine 10 mg daily and hydralazine 25 mg 3 times daily and titrate as needed    5 history of lupus nephritis-not currently on any lupus medications after being started on hemodialysis per patient.  She was previously on CellCept, Plaquenil, prednisone.    6 HD non compliance -unclear etiology.  Case management to assist in getting more details regarding transportation issue      VTE Prophylaxis: SCDs eliquis     Notes:  I have reviewed/summarized previous records    Expected length of stay: The patient's anticipated length of stay is less than two nights.    Devam Katheren Puller, MD    Portions of this record may have been dictated using voice recognition software.  Unintentional errors in spelling and vocabulary are possible and may sometimes remain uncorrected.

## 2022-07-03 NOTE — Progress Notes (Signed)
Pt refusing am vitals.

## 2022-07-03 NOTE — Progress Notes (Signed)
Pt left AMA. Pt refused to sign AMA paperwork. Pt educated on the risks of leaving AMA. Pt verbalized understanding. Pt IV and telly removed. Pt walked off the unit. Provider notified.

## 2022-07-03 NOTE — Discharge Summary (Signed)
Portions of this record may be dictated using voice recognition software. Variances in spelling and vocabulary are possible and unintentional. Some errors may not be recognized or corrected at the time of dictation.    Date of Admission: 07/02/2022    Date of Discharge:  pt left AMA on 07/03/2022    Discharging Team:  Hospitalist    Primary Care Physician: Elby Beck, NP    Consultants: None    Discharge Diagnoses:     Active Hospital Problems    Diagnosis Date Noted   . Flank pain 07/03/2022   . Renal hemorrhage, left 07/03/2022   . Hematoma of left kidney 07/03/2022   . 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 Hematoma of kidney without rupture of capsule 05/22/2022   . Hyperkalemia 02/08/2022   . End-stage renal disease on hemodialysis (CMS/HCC) 03/08/2019   . Primary hypertension 03/08/2019      Resolved Hospital Problems   No resolved problems to display.       Discharge Medications:      Discharge Medications        .      albuterol-ipratropium 18-103 mcg/actuation inhaler  Inhale 2 puffs every 4 (four) hours as needed for wheezing or shortness of breath.  Commonly known as: COMBIVENT     amLODIPine 10 MG tablet  Take 1 tablet (10 mg total) by mouth daily.  Commonly known as: NORVASC     calcium acetate(phosphat bind) 667 mg capsule  Take 2 capsules (1,334 mg total) by mouth 3 (three) times a day with meals.  Commonly known as: PHOSLO     calcium carbonate 200 mg calcium (500 mg) chewable tablet  Chew 2 tablets (1,000 mg total) See Admin Instructions.  Commonly known as: TUMS     Eliquis 2.5 mg Tab tablet  Take 1 tablet (2.5 mg total) by mouth 2 (two) times a day.  Generic drug: apixaban     hydrALAZINE 25 MG tablet  Take 1 tablet (25 mg total) by mouth 3 (three) times a day.  Commonly known as: APRESOLINE     metoprolol succinate 25 MG 24 hr tablet  Take 1 tablet (25 mg total) by mouth daily.  Commonly known as: TOPROL-XL     pantoprazole 40 MG tablet  Take 1 tablet (40 mg  total) by mouth daily.  Commonly known as: PROTONIX              Other Discharge Instructions:   Disposition:  Left Against Medical Advice  Follow up:  No follow-ups on file.  Activity:  As tolerated  Diet:  No diet orders on file    Studies/Procedures:    Lab Results   Component Value Date    WBC 3.4 (L) 07/02/2022    HGB 8.1 (L) 07/02/2022    HCT 24 (L) 07/02/2022    PLT 151 07/02/2022    INR 1.21 07/02/2022    NA 135 (L) 07/02/2022    K 6.8 (HH) 07/02/2022    CL 97 (L) 07/02/2022    CO2 18 (L) 07/02/2022    BUN 105 (H) 07/02/2022    CREAT 14.54 (H) 07/02/2022    GLU 62 (L) 07/02/2022    CA 6.8 (L) 07/02/2022    MG 2.2 07/02/2022    PHOS 6.0 (H) 07/02/2022    ALB 4.0 07/02/2022    TP 7.1 07/02/2022    ALT 7 07/02/2022    AST 11 (L) 07/02/2022  BILIT 0.3 07/02/2022    LIPAS 50 07/02/2022    CPK 98 02/08/2022    TROP <0.03 08/19/2019    BNP 461 (H) 05/21/2022    TSH 0.76 02/08/2022    PA1C 4.5 02/08/2022    CHOL 140 02/08/2022    LDL 81 02/08/2022    HDL 52 02/08/2022    TRIG 34 02/08/2022    UWBC <5 10/12/2021    URBC <3 10/12/2021    UBAC 1+ (A) 10/12/2021    ULEU NEGATIVE 10/12/2021    UNIT NEGATIVE 10/12/2021       XR Chest 2 Views    Result Date: 07/03/2022  IMPRESSION: Cardiomegaly, venous congestion.     CT Abdomen Pelvis Without Contrast    Result Date: 07/02/2022  IMPRESSION: 1.  No acute findings in the abdomen or pelvis. 2.  Significant decrease in size of left renal hematomas. 3.  Diffuse increased density to the liver which can be seen with amiodarone therapy. 4.  Renal osteodystrophy.    ECG 12 lead;    Result Date: 07/02/2022  Normal sinus rhythm Possible Left atrial enlargement Prolonged QT interval or tu fusion, consider myocardial disease, electrolyte imbalance, or drug effects Abnormal ECG When compared with ECG of 22-May-2022 01:00, No significant change was found Confirmed by RUTZ, ADAM ([id number]) on 07/02/2022 9:04:29 PM    CT Abdomen Pelvis Without Contrast    Result Date: 06/25/2022  -Interval  enlargement of the lower pole of the left kidney, likely representing hematoma given the clinical history. Superimposed infection cannot be excluded via imaging. Comparison with more recent prior imaging is recommended to evaluate whether or not this presumed hematoma is enlarging or resolving. - Additional chronic and incidental findings, as above.    XR Chest Portable    Result Date: 06/25/2022  Findings concerning for mild pulmonary edema.    US Renal Bilateral    Result Date: 06/04/2022  IMPRESSION: Decreased size of the LEFT renal hematoma. No Doppler flow in the LEFT kidney. I discussed the results of this exam with [name] over the phone at 2:20 PM on June 04, 2022`.    History of Present Illness/Hospital course  :  Please see admission note for full details regarding the patient's presentation.  In brief, this 37 y.o. 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  Patient mentions that she does have intermittent nausea vomiting and left flank pain, which have however worsened since Wednesday.  To be noted, patient has multiple ER visits in various cities such as [facility], [address] [facility], [place] [facility] [place] and now [name] for similar complaints mostly including left flank pain associate with nausea vomiting and missing hemodialysis session due to transportation issues.  Review of previous charts revealed that case management have tried to the best ability to arrange for transportation to her hemodialysis  center in [place].  Patient does endorse left flank pain 7 out of 10, dull aching, pain radiating to front.  Patient does not take any pain medicine at home.  PDMP  aware checked and there is no opiate prescription in last few  months, however patient does have frequent ER visits and hospital stay in past few months needing IV pain medicine especially Dilaudid and IV Phenergan.   On 07/03/2022, pt refusing blood work and medications. Asking to change her dilaudid and phenergan to IV stating she can't take POs due to n/v, but pt appears comfortable in bed , no n/v reported.  Last HD was 5 days ago, and was due for HD yesterday  Soon after this interaction, pt informed RN that she wants to leave AMA. Risks of leaving without HD were discussed with pt.  Pt refused to sign AMA paperwork.    Physical Examination:   VITAL SIGNS: BP 146/86 (BP Location: Left upper arm, BP Position: Supine, Cuff Size: Adult (Navy Blue))   Pulse 81   Temp 97.9 F (36.6 C) (Oral)   Resp 18   Ht 1.676 m (5\' 6" )   Wt 81 kg (178 lb 9.2 oz)   LMP 04/25/2022   SpO2 96%   BMI 28.82 kg/m    Temp (24hrs), Avg:97.9 F (36.6 C), Min:97.9 F (36.6 C), Max:97.9 F (36.6 C)     CONSTITUTIONAL: Well-appearing female in no acute distress.   HEENT: Pupils equal bilaterally. Conjunctivae clear, sclerae anicteric. Oropharynx clear, mucous membranes moist.  NECK: Neck supple, no obvious masses.  CARDIOVASCULAR: Heart rhythm regular, normal S1 and S2. No murmur. Distal pulses equal bilaterally. No jugular venous distention. No peripheral edema.  RESPIRATORY: Chest clear to auscultation bilaterally, normal work of breathing.  ABDOMEN: Abdomen soft, nontender, with normoactive bowel sounds.   NEUROLOGIC: Normal speech, no focal findings. Mental status alert and oriented.  PSYCHIATRIC: Mood and affect normal    The patient was instructed on indications for seeking medical attention.  After physical examination and a review of our findings and  recommendations, the patient was cleared for discharge.  I spent less than 30 minutes in the discharge of this patient.    Preeti Lisabeth Register, MD

## 2022-07-03 NOTE — Progress Notes (Signed)
Per phlebotomy tech pt refusing labs this am.

## 2022-07-08 ENCOUNTER — Emergency Department: Payer: No Typology Code available for payment source

## 2022-07-08 ENCOUNTER — Inpatient Hospital Stay
Admission: EM | Admit: 2022-07-08 | Discharge: 2022-07-11 | DRG: 640 | Disposition: A | Discharge status: Home / Self Care | Payer: No Typology Code available for payment source | Attending: Internal Medicine | Admitting: Internal Medicine

## 2022-07-08 ENCOUNTER — Other Ambulatory Visit: Payer: Self-pay

## 2022-07-08 DIAGNOSIS — Z7901 Long term (current) use of anticoagulants: Secondary | ICD-10-CM

## 2022-07-08 DIAGNOSIS — E871 Hypo-osmolality and hyponatremia: Secondary | ICD-10-CM | POA: Diagnosis present

## 2022-07-08 DIAGNOSIS — R7989 Other specified abnormal findings of blood chemistry: Secondary | ICD-10-CM | POA: Diagnosis present

## 2022-07-08 DIAGNOSIS — Z794 Long term (current) use of insulin: Secondary | ICD-10-CM

## 2022-07-08 DIAGNOSIS — Z91158 Patient's noncompliance with renal dialysis for other reason: Secondary | ICD-10-CM

## 2022-07-08 DIAGNOSIS — N186 End stage renal disease: Secondary | ICD-10-CM | POA: Diagnosis present

## 2022-07-08 DIAGNOSIS — D631 Anemia in chronic kidney disease: Secondary | ICD-10-CM | POA: Diagnosis present

## 2022-07-08 DIAGNOSIS — E663 Overweight: Secondary | ICD-10-CM | POA: Diagnosis present

## 2022-07-08 DIAGNOSIS — Z6829 Body mass index (BMI) 29.0-29.9, adult: Secondary | ICD-10-CM

## 2022-07-08 DIAGNOSIS — E1122 Type 2 diabetes mellitus with diabetic chronic kidney disease: Secondary | ICD-10-CM | POA: Diagnosis present

## 2022-07-08 DIAGNOSIS — Z992 Dependence on renal dialysis: Secondary | ICD-10-CM

## 2022-07-08 DIAGNOSIS — R079 Chest pain, unspecified: Secondary | ICD-10-CM

## 2022-07-08 DIAGNOSIS — E872 Acidosis, unspecified: Secondary | ICD-10-CM | POA: Diagnosis present

## 2022-07-08 DIAGNOSIS — E8770 Fluid overload, unspecified: Secondary | ICD-10-CM | POA: Diagnosis present

## 2022-07-08 DIAGNOSIS — E875 Hyperkalemia: Principal | ICD-10-CM | POA: Diagnosis present

## 2022-07-08 DIAGNOSIS — R197 Diarrhea, unspecified: Secondary | ICD-10-CM | POA: Diagnosis present

## 2022-07-08 DIAGNOSIS — J811 Chronic pulmonary edema: Secondary | ICD-10-CM | POA: Diagnosis present

## 2022-07-08 DIAGNOSIS — Z79899 Other long term (current) drug therapy: Secondary | ICD-10-CM

## 2022-07-08 DIAGNOSIS — I120 Hypertensive chronic kidney disease with stage 5 chronic kidney disease or end stage renal disease: Secondary | ICD-10-CM | POA: Diagnosis present

## 2022-07-08 DIAGNOSIS — M3214 Glomerular disease in systemic lupus erythematosus: Secondary | ICD-10-CM | POA: Diagnosis present

## 2022-07-08 DIAGNOSIS — Z86718 Personal history of other venous thrombosis and embolism: Secondary | ICD-10-CM

## 2022-07-08 DIAGNOSIS — I160 Hypertensive urgency: Secondary | ICD-10-CM | POA: Diagnosis present

## 2022-07-08 DIAGNOSIS — D696 Thrombocytopenia, unspecified: Secondary | ICD-10-CM | POA: Diagnosis not present

## 2022-07-08 HISTORY — DX: End stage renal disease: N18.6

## 2022-07-08 HISTORY — DX: Essential (primary) hypertension: I10

## 2022-07-08 HISTORY — DX: Acute posthemorrhagic anemia: D62

## 2022-07-08 HISTORY — DX: Calculus of kidney: N20.0

## 2022-07-08 HISTORY — DX: Systemic lupus erythematosus, unspecified: M32.9

## 2022-07-08 HISTORY — DX: Chronic kidney disease, unspecified: N18.9

## 2022-07-08 HISTORY — DX: Hyperkalemia: E87.5

## 2022-07-08 LAB — WHOLE BLOOD GLUCOSE POCT
Whole Blood Glucose POCT: 73 mg/dL (ref 70–100)
Whole Blood Glucose POCT: 74 mg/dL (ref 70–100)

## 2022-07-08 LAB — BETA HCG QUANTITATIVE, PREGNANCY: hCG, Quant.: <2.4 m[IU]/mL

## 2022-07-08 LAB — BASIC METABOLIC PANEL
Anion Gap: 20 — ABNORMAL HIGH (ref 5.0–15.0)
BUN: 72 mg/dL — ABNORMAL HIGH (ref 7.0–21.0)
CO2: 20 mEq/L (ref 17–29)
Calcium: 7.3 mg/dL — ABNORMAL LOW (ref 8.5–10.5)
Chloride: 94 mEq/L — ABNORMAL LOW (ref 99–111)
Creatinine: 13.9 mg/dL — ABNORMAL HIGH (ref 0.4–1.0)
Glucose: 63 mg/dL — ABNORMAL LOW (ref 70–100)
Potassium: 6.2 mEq/L (ref 3.5–5.3)
Sodium: 134 mEq/L — ABNORMAL LOW (ref 135–145)
eGFR: 3.2 mL/min/{1.73_m2} — AB (ref >=60)

## 2022-07-08 LAB — CBC AND DIFFERENTIAL
Absolute NRBC: 0 10*3/uL (ref 0.00–0.00)
Basophils Absolute Automated: 0.06 10*3/uL (ref 0.00–0.08)
Basophils Automated: 1.8 %
Eosinophils Absolute Automated: 0.36 10*3/uL (ref 0.00–0.44)
Eosinophils Automated: 10.8 %
Hematocrit: 24.6 % — ABNORMAL LOW (ref 34.7–43.7)
Hgb: 8 g/dL — ABNORMAL LOW (ref 11.4–14.8)
Immature Granulocytes Absolute: 0.01 10*3/uL (ref 0.00–0.07)
Immature Granulocytes: 0.3 %
Instrument Absolute Neutrophil Count: 1.86 10*3/uL (ref 1.10–6.33)
Lymphocytes Absolute Automated: 0.65 10*3/uL (ref 0.42–3.22)
Lymphocytes Automated: 19.5 %
MCH: 29.4 pg (ref 25.1–33.5)
MCHC: 32.5 g/dL (ref 31.5–35.8)
MCV: 90.4 fL (ref 78.0–96.0)
MPV: 11.2 fL (ref 8.9–12.5)
Monocytes Absolute Automated: 0.39 10*3/uL (ref 0.21–0.85)
Monocytes: 11.7 %
Neutrophils Absolute: 1.86 10*3/uL (ref 1.10–6.33)
Neutrophils: 55.9 %
Nucleated RBC: 0 /100 WBC (ref 0.0–0.0)
Platelets: 156 10*3/uL (ref 142–346)
RBC: 2.72 10*6/uL — ABNORMAL LOW (ref 3.90–5.10)
RDW: 16 % — ABNORMAL HIGH (ref 11–15)
WBC: 3.33 10*3/uL (ref 3.10–9.50)

## 2022-07-08 LAB — COMPREHENSIVE METABOLIC PANEL
ALT: 10 U/L (ref 0–55)
AST (SGOT): 18 U/L (ref 5–41)
Albumin/Globulin Ratio: 0.9 (ref 0.9–2.2)
Albumin: 3.6 g/dL (ref 3.5–5.0)
Alkaline Phosphatase: 124 U/L — ABNORMAL HIGH (ref 37–117)
Anion Gap: 22 — ABNORMAL HIGH (ref 5.0–15.0)
BUN: 72 mg/dL — ABNORMAL HIGH (ref 7.0–21.0)
Bilirubin, Total: 0.4 mg/dL (ref 0.2–1.2)
CO2: 22 mEq/L (ref 17–29)
Calcium: 7.4 mg/dL — ABNORMAL LOW (ref 8.5–10.5)
Chloride: 92 mEq/L — ABNORMAL LOW (ref 99–111)
Creatinine: 13.9 mg/dL — ABNORMAL HIGH (ref 0.4–1.0)
Globulin: 4.1 g/dL — ABNORMAL HIGH (ref 2.0–3.6)
Glucose: 75 mg/dL (ref 70–100)
Potassium: 6.2 mEq/L (ref 3.5–5.3)
Protein, Total: 7.7 g/dL (ref 6.0–8.3)
Sodium: 136 mEq/L (ref 135–145)
eGFR: 3.2 mL/min/{1.73_m2} — AB (ref >=60)

## 2022-07-08 LAB — PHOSPHORUS: Phosphorus: 4.1 mg/dL (ref 2.3–4.7)

## 2022-07-08 LAB — NT-PROBNP: NT-proBNP: 20188 pg/mL — ABNORMAL HIGH (ref 0–125)

## 2022-07-08 LAB — MAGNESIUM: Magnesium: 2 mg/dL (ref 1.6–2.6)

## 2022-07-08 LAB — LIPASE: Lipase: 37 U/L (ref 8–78)

## 2022-07-08 LAB — HIGH SENSITIVITY TROPONIN-I: hs Troponin-I: 14.6 ng/L — AB

## 2022-07-08 MED ORDER — SALINE SPRAY 0.65 % NA SOLN
2.0000 | NASAL | Status: DC | PRN
Start: 2022-07-08 — End: 2022-07-11

## 2022-07-08 MED ORDER — PANTOPRAZOLE SODIUM 40 MG PO TBEC
40.0000 mg | DELAYED_RELEASE_TABLET | Freq: Every morning | ORAL | Status: DC
Start: 2022-07-09 — End: 2022-07-11
  Administered 2022-07-09 – 2022-07-11 (×3): 40 mg via ORAL
  Filled 2022-07-08 (×3): qty 1

## 2022-07-08 MED ORDER — HYDRALAZINE HCL 25 MG PO TABS
25.0000 mg | ORAL_TABLET | Freq: Three times a day (TID) | ORAL | Status: DC
Start: 2022-07-08 — End: 2022-07-11
  Administered 2022-07-08 – 2022-07-11 (×9): 25 mg via ORAL
  Filled 2022-07-08 (×9): qty 1

## 2022-07-08 MED ORDER — INSULIN SYRINGES (DISPOSABLE) U-100 1 ML MISC
5.0000 [IU] | Freq: Once | Status: DC
Start: 2022-07-08 — End: 2022-07-11
  Filled 2022-07-08: qty 5

## 2022-07-08 MED ORDER — DEXTROSE 10 % IV BOLUS
50.0000 g | Freq: Once | INTRAVENOUS | Status: AC
Start: 2022-07-08 — End: 2022-07-08
  Administered 2022-07-08: 500 mL via INTRAVENOUS

## 2022-07-08 MED ORDER — BENZOCAINE-MENTHOL MT LOZG (WRAP)
1.0000 | LOZENGE | OROMUCOSAL | Status: DC | PRN
Start: 2022-07-08 — End: 2022-07-11

## 2022-07-08 MED ORDER — GLUCAGON 1 MG IJ SOLR (WRAP)
1.0000 mg | INTRAMUSCULAR | Status: DC | PRN
Start: 2022-07-08 — End: 2022-07-11

## 2022-07-08 MED ORDER — NALOXONE HCL 0.4 MG/ML IJ SOLN (WRAP)
0.2000 mg | INTRAMUSCULAR | Status: DC | PRN
Start: 2022-07-08 — End: 2022-07-11

## 2022-07-08 MED ORDER — APIXABAN 2.5 MG PO TABS
2.5000 mg | ORAL_TABLET | Freq: Two times a day (BID) | ORAL | Status: DC
Start: 2022-07-08 — End: 2022-07-11
  Administered 2022-07-08 – 2022-07-11 (×6): 2.5 mg via ORAL
  Filled 2022-07-08 (×6): qty 1

## 2022-07-08 MED ORDER — DEXTROSE 10 % IV BOLUS
12.5000 g | INTRAVENOUS | Status: DC | PRN
Start: 2022-07-08 — End: 2022-07-11

## 2022-07-08 MED ORDER — AMLODIPINE BESYLATE 5 MG PO TABS
10.0000 mg | ORAL_TABLET | Freq: Every day | ORAL | Status: DC
Start: 2022-07-08 — End: 2022-07-11
  Administered 2022-07-08 – 2022-07-11 (×3): 10 mg via ORAL
  Filled 2022-07-08 (×3): qty 2

## 2022-07-08 MED ORDER — SODIUM CHLORIDE 0.9 % IV SOLN
12.5000 mg | Freq: Four times a day (QID) | INTRAVENOUS | Status: DC | PRN
Start: 2022-07-08 — End: 2022-07-11
  Administered 2022-07-08 – 2022-07-11 (×6): 12.5 mg via INTRAVENOUS
  Filled 2022-07-08: qty 0.5
  Filled 2022-07-08: qty 12.5
  Filled 2022-07-08 (×4): qty 0.5

## 2022-07-08 MED ORDER — BENZONATATE 100 MG PO CAPS
100.0000 mg | ORAL_CAPSULE | Freq: Three times a day (TID) | ORAL | Status: DC | PRN
Start: 2022-07-08 — End: 2022-07-11

## 2022-07-08 MED ORDER — ONDANSETRON 4 MG PO TBDP
4.0000 mg | ORAL_TABLET | Freq: Four times a day (QID) | ORAL | Status: DC | PRN
Start: 2022-07-08 — End: 2022-07-11

## 2022-07-08 MED ORDER — FUROSEMIDE 10 MG/ML IJ SOLN
80.0000 mg | Freq: Once | INTRAMUSCULAR | Status: AC
Start: 2022-07-08 — End: 2022-07-08
  Administered 2022-07-08: 80 mg via INTRAVENOUS
  Filled 2022-07-08: qty 8

## 2022-07-08 MED ORDER — GLUCOSE 40 % PO GEL (WRAP)
15.0000 g | ORAL | Status: DC | PRN
Start: 2022-07-08 — End: 2022-07-11

## 2022-07-08 MED ORDER — DEXTROSE 50 % IV SOLN
12.5000 g | INTRAVENOUS | Status: DC | PRN
Start: 2022-07-08 — End: 2022-07-11

## 2022-07-08 MED ORDER — SODIUM CHLORIDE 0.9 % IV SOLN
12.5000 mg | Freq: Once | INTRAVENOUS | Status: AC
Start: 2022-07-08 — End: 2022-07-08
  Administered 2022-07-08: 12.5 mg via INTRAVENOUS
  Filled 2022-07-08: qty 12.5

## 2022-07-08 MED ORDER — CARBOXYMETHYLCELLULOSE SODIUM 0.5 % OP SOLN
1.0000 [drp] | Freq: Three times a day (TID) | OPHTHALMIC | Status: DC | PRN
Start: 2022-07-08 — End: 2022-07-11

## 2022-07-08 MED ORDER — SODIUM POLYSTYRENE SULFONATE 15 GM/60ML PO SUSP
15.0000 g | Freq: Once | ORAL | Status: AC
Start: 2022-07-08 — End: 2022-07-08
  Administered 2022-07-08: 15 g via ORAL
  Filled 2022-07-08: qty 60

## 2022-07-08 MED ORDER — METOPROLOL SUCCINATE ER 25 MG PO TB24
25.0000 mg | ORAL_TABLET | Freq: Every day | ORAL | Status: DC
Start: 2022-07-09 — End: 2022-07-11
  Administered 2022-07-09 – 2022-07-11 (×3): 25 mg via ORAL
  Filled 2022-07-08 (×3): qty 1

## 2022-07-08 MED ORDER — HYDROMORPHONE HCL 1 MG/ML IJ SOLN
1.0000 mg | Freq: Once | INTRAMUSCULAR | Status: DC
Start: 2022-07-08 — End: 2022-07-08

## 2022-07-08 MED ORDER — MELATONIN 3 MG PO TABS
3.0000 mg | ORAL_TABLET | Freq: Every evening | ORAL | Status: DC | PRN
Start: 2022-07-08 — End: 2022-07-11

## 2022-07-08 MED ORDER — CALCIUM ACETATE (PHOS BINDER) 667 MG PO CAPS
667.0000 mg | ORAL_CAPSULE | Freq: Three times a day (TID) | ORAL | Status: DC
Start: 2022-07-09 — End: 2022-07-08

## 2022-07-08 MED ORDER — HYDROMORPHONE HCL 2 MG PO TABS
2.0000 mg | ORAL_TABLET | ORAL | Status: DC | PRN
Start: 2022-07-08 — End: 2022-07-11
  Administered 2022-07-08 – 2022-07-10 (×6): 2 mg via ORAL
  Filled 2022-07-08 (×7): qty 1

## 2022-07-08 MED ORDER — CALCIUM GLUCONATE 10 % IV SOLN
1.0000 g | Freq: Once | INTRAVENOUS | Status: AC
Start: 2022-07-08 — End: 2022-07-08
  Administered 2022-07-08: 1 g via INTRAVENOUS
  Filled 2022-07-08: qty 10

## 2022-07-08 MED ORDER — HYDROMORPHONE HCL 2 MG PO TABS
2.0000 mg | ORAL_TABLET | Freq: Once | ORAL | Status: AC
Start: 2022-07-08 — End: 2022-07-08
  Administered 2022-07-08: 2 mg via ORAL
  Filled 2022-07-08: qty 1

## 2022-07-08 MED ORDER — CALCIUM ACETATE (PHOS BINDER) 667 MG PO CAPS
1334.0000 mg | ORAL_CAPSULE | Freq: Three times a day (TID) | ORAL | Status: DC
Start: 2022-07-09 — End: 2022-07-11
  Administered 2022-07-09 – 2022-07-11 (×7): 1334 mg via ORAL
  Filled 2022-07-08 (×7): qty 2

## 2022-07-08 MED ORDER — ONDANSETRON HCL 4 MG/2ML IJ SOLN
4.0000 mg | Freq: Four times a day (QID) | INTRAMUSCULAR | Status: DC | PRN
Start: 2022-07-08 — End: 2022-07-11
  Administered 2022-07-09: 4 mg via INTRAVENOUS
  Filled 2022-07-08 (×2): qty 2

## 2022-07-08 NOTE — ED Triage Notes (Signed)
Pt came in for L flank pain, n/v/d for the past 4 days. Pt is a dialysis pt, does dialysis Tuesday/Thursday/Saturday. Pt states her last Dialysis was last Tuesday. Pt states she missed her 4 dialysis sessions because she did not have a transportation arranged. Pt also c/o SOB with exertion. Pt denies CP. Pt is ambulatory, A&Ox4.

## 2022-07-08 NOTE — ED Provider Notes (Signed)
History     Chief Complaint   Patient presents with    Flank Pain    Nausea    Emesis    Diarrhea     HPI Katherine [name] is a 37 y.o. female Katherine [name] with abdominal pain, leg swelling, shortness of breath in the setting of missed dialysis.  Patient has not had dialysis in the past 4 days secondary to traveling related to a family emergency.  She reports tightness in her abdomen, lower extremities, feeling short of breath which she reports is consistent with having missed her dialysis.  No palpitations or lightheadedness.  No LOC.  No dark or bloody stools.  She does report nonbloody, nonbilious emesis.    Past Medical History:   Diagnosis Date    Acute post-hemorrhagic anemia     Anemia in chronic kidney disease (CKD)     ESRD (end stage renal disease)     ESRD on hemodialysis     Hyperkalemia     Hypertension     Left nephrolithiasis     Systemic lupus erythematosus        History reviewed. No pertinent surgical history.    History reviewed. No pertinent family history.    Social  Social History     Tobacco Use    Smoking status: Never    Smokeless tobacco: Never   Vaping Use    Vaping status: Never Used   Substance Use Topics    Alcohol use: Never    Drug use: Never       .     Allergies   Allergen Reactions    Ace Inhibitors Anaphylaxis    Acetaminophen Anaphylaxis    Fentanyl Anaphylaxis    Hydrocodone Anaphylaxis    Oxycodone Anaphylaxis    Sulfa Antibiotics Anaphylaxis    Nsaids      Contraindicated, dialysis py       Home Medications       Med List Status: Pharmacy Completed Set By: Mosie Lukes, RPH at 07/08/2022  6:53 PM                                              Review of Systems   Constitutional:  Negative for activity change, appetite change, fatigue and fever.   HENT:  Negative for congestion and rhinorrhea.    Eyes:  Negative for photophobia and visual disturbance.   Respiratory:  Positive for shortness of breath. Negative for chest tightness.    Cardiovascular:  Positive for leg  swelling. Negative for chest pain and palpitations.   Gastrointestinal:  Positive for abdominal pain, nausea and vomiting. Negative for abdominal distention and diarrhea.   Endocrine: Negative for polydipsia and polyuria.   Genitourinary:  Negative for dysuria and frequency.   Musculoskeletal:  Negative for arthralgias and back pain.   Skin:  Negative for color change, rash and wound.   Neurological:  Negative for seizures, facial asymmetry, speech difficulty, light-headedness and numbness.   Psychiatric/Behavioral:  Negative for agitation and behavioral problems.        Physical Exam    BP: (!) 144/91, Heart Rate: 89, Temp: 98.4 F (36.9 C), Resp Rate: 18, SpO2: 98 %, Weight: 82.1 kg    Physical Exam  Constitutional:       Appearance: Normal appearance. She is well-developed.   HENT:      Head: Normocephalic and  atraumatic.      Right Ear: External ear normal.      Left Ear: External ear normal.      Nose: Nose normal.   Eyes:      Extraocular Movements: Extraocular movements intact.      Pupils: Pupils are equal, round, and reactive to light.   Cardiovascular:      Rate and Rhythm: Normal rate and regular rhythm.   Pulmonary:      Effort: Pulmonary effort is normal.      Breath sounds: Normal breath sounds. No decreased breath sounds, wheezing, rhonchi or rales.   Abdominal:      General: There is no distension.      Palpations: Abdomen is soft.      Tenderness: There is no abdominal tenderness. There is no right CVA tenderness or left CVA tenderness.   Musculoskeletal:         General: Normal range of motion.      Cervical back: Normal range of motion and neck supple.      Right lower leg: No tenderness. No edema.      Left lower leg: No tenderness. No edema.   Skin:     General: Skin is warm and dry.      Capillary Refill: Capillary refill takes less than 2 seconds.   Neurological:      General: No focal deficit present.      Mental Status: She is alert and oriented to person, place, and time.      GCS: GCS eye  subscore is 4. GCS verbal subscore is 5. GCS motor subscore is 6.      Cranial Nerves: No cranial nerve deficit, dysarthria or facial asymmetry.      Sensory: No sensory deficit.      Motor: No weakness or tremor.      Gait: Gait is intact.   Psychiatric:         Mood and Affect: Mood normal.         Behavior: Behavior normal.           MDM and ED Course     ED Medication Orders (From admission, onward)      Start Ordered     Status Ordering Provider    07/09/22 0800 07/08/22 2046    3 times daily with meals        Route: Oral  Ordered Dose: 667 mg       Discontinued NASEEM, RABIAH    07/08/22 2100 07/08/22 2044    Every 12 hours scheduled        Route: Oral  Ordered Dose: 2.5 mg       Discontinued NASEEM, RABIAH    07/08/22 2046 07/08/22 2047    Every 3 hours PRN        Route: Oral  Ordered Dose: 2 mg       Discontinued NASEEM, RABIAH    07/08/22 2046 07/08/22 2047    Every 6 hours PRN        Route: Intravenous  Ordered Dose: 12.5 mg       Discontinued NASEEM, RABIAH    07/08/22 1827 07/08/22 1826  sodium polystyrene (KAYEXALATE) 15 GM/60ML suspension 15 g  Once        Route: Oral  Ordered Dose: 15 g       Last MAR action: Given NASEEM, RABIAH    07/08/22 1827 07/08/22 1826  furosemide (LASIX) injection 80 mg  Once  Route: Intravenous  Ordered Dose: 80 mg       Last MAR action: Given NASEEM, RABIAH    07/08/22 1730 07/08/22 1729  dextrose (D10W) 10% bolus 500 mL  Once        Route: Intravenous  Ordered Dose: 50 g      Placed in "And" Linked Group    Last MAR action: Blair Promise    07/08/22 1730 07/08/22 1729    Once        Route: Intravenous  Ordered Dose: 5 Units      Placed in "And" Linked Group    Discontinued NASEEM, RABIAH    07/08/22 1716 07/08/22 1715  calcium GLUConate 10 % injection 1 g  Once        Route: Intravenous  Ordered Dose: 1 g       Last MAR action: Given Starlyn Skeans    07/08/22 1601 07/08/22 1600  HYDROmorphone (DILAUDID) tablet 2 mg  Once        Route: Oral  Ordered Dose:  2 mg       Last MAR action: Given Starlyn Skeans    07/08/22 1548 07/08/22 1547  promethazine (PHENERGAN) 12.5 mg in sodium chloride 0.9 % 50 mL IVPB  Once        Route: Intravenous  Ordered Dose: 12.5 mg       Last MAR action: Blair Promise    07/08/22 1548 07/08/22 1547    Once        Route: Intravenous  Ordered Dose: 1 mg       Discontinued Starlyn Skeans               Medical Decision Making  36 year old presents with abdominal pain, nausea and vomiting, shortness of breath, leg swelling the setting of missed dialysis  Concern for volume overload, pulmonary edema, pleural effusion, electrolyte disturbance, uremia, AKI  Lab work and imaging ordered  CBC notable for anemia, CMP elevated for BUN, creatinine, hyperkalemia  Nephrology consulted, recommendation for hyperkalemia protocol, repeat BMP  Patient will be admitted, have dialysis either tonight or this coming morning  Patient endorsed to hospital medicine service, accepted for admission      Amount and/or Complexity of Data Reviewed  Labs: ordered.    Risk  Prescription drug management.  Decision regarding hospitalization.               EKG demonstrating sinus rhythm, significant artifact rate of 82, PR 192, QTc of 406, no overtly ischemic appearing ST segment elevations or depressions or concerning T wave inversion      Procedures    Clinical Impression & Disposition     Clinical Impression  Final diagnoses:   Hyperkalemia        ED Disposition       ED Disposition   Admit    Condition   --    Date/Time   Fri Jul 08, 2022  6:26 PM    Comment   Admitting Physician: Jonni Sanger [[id number]]   Service:: Medicine [[id number]]   Estimated Length of Stay: > or = to 2 midnights   Tentative Discharge Plan?: Home or Self Care [1]   Does patient need telemetry?: Yes   Is patient 18 yrs or greater?: Yes   Telemetry type (separate Telemetry order is also required):: Adult telemetry                  Discharge Medication List as of 07/11/2022  3:22 PM  START  taking these medications    Details   metoprolol succinate XL (TOPROL-XL) 25 MG 24 hr tablet Take 1 tablet (25 mg) by mouth daily, Starting Tue 07/12/2022, [facility]                         Yoanna Jurczyk, Susann Givens, MD  07/24/22 1429

## 2022-07-08 NOTE — EDIE (Signed)
[facility]?NOTIFICATION?07/08/2022 14:44?MOHOGANY, TOPPINS D?MRN: [id number]    Criteria Met      3 Different Facilities in 90 Days    5 ED Visits in 12 Months    Security and Safety  No Security Events were found.  ED Care Guidelines  There are currently no ED Care Guidelines for this patient. Please check your facility's medical records system.        Prescription Monitoring Program  Narx Score not available at this time.    E.D. Visit Count (12 mo.)  Facility Visits   [facility]   [facility] 1   [facility] 1   [facility] 1   [facility] 1   Total 6   Note: Visits indicate total known visits.     Recent Emergency Department Visit Summary  Date Facility [facility] Type Diagnoses or Chief Complaint    Jul 08, 2022  [facility] [facility]  [place]  [place]  Emergency      L side; N/V; SOB      May 07, 2022  [facility]  [place]  Emergency      Hyperkalemia      End stage renal disease      May 07, 2022  [facility]  [place]  [place]  Emergency      Hyperkalemia      End stage renal disease      Dependence on renal dialysis      May 06, 2022  [facility]  [place]  [place]  Emergency      Nausea with vomiting, unspecified      Dependence on renal dialysis      End stage renal disease      Generalized abdominal pain      Mar 13, 2022  [facility]  [place]  Emergency     Jan 23, 2022  [facility]  [place]  [facility]gency       Recent Inpatient Visit Summary  Date Facility [facility] Type Diagnoses or Chief Complaint    May 07, 2022  [facility] [place] [facility]  [place]  Internal Medicine      Other specified disorders of kidney and ureter      End stage renal disease      Hyperkalemia      Dependence on renal dialysis      Mar 12, 2022  [facility]  [place]  [place]  Medical Surgical      Acute cystitis without hematuria      Secondary hyperparathyroidism  of renal origin      Unspecified abdominal pain      Nausea      Jan 23, 2022  [facility]  [place]  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 Family Medicine [phone] Current      [facility]  This patient has registered at the [facility] Emergency Department  For more information visit: [email or web address]     PLEASE NOTE:     1.   Any care recommendations and other clinical information are provided as guidelines or for historical purposes only, and providers should exercise their own clinical judgment when providing care.    2.   You may only use this information for purposes of treatment, payment or health care operations activities, and subject to the limitations of applicable [facility] Policies.    3.   You should consult directly with the organization that provided a care guideline or other clinical history with any questions about additional information or accuracy or completeness of information provided.    ? 2024 [facility] - [email or web address]

## 2022-07-08 NOTE — ED Triage Notes (Signed)
[facility] EMERGENCY DEPARTMENT  Provider in Triage Note        Patient Name: Katherine [name]    Chief Complaint:   Chief Complaint   Patient presents with    Flank Pain    Nausea    Emesis    Diarrhea       HPI: Katherine [name] is a 37 y.o. female, who has had a rapid medical screening evaluation initiated by myself. Pt with flank pain, nausea, vomiting, diarrhea x 1 week worsening. Pt is a dialysis patient and missed her dialysis on since last Tuesday (missed four sessions, she is on Tuesday thurs sat schedule). Also some SOB    Medical/Surgical/Social history: as per HPI    Vitals: BP (!) 144/91   Pulse 89   Temp 98.4 F (36.9 C) (Oral)   Resp 18   Ht 5\' 6"  (1.676 m)   Wt 82.1 kg   SpO2 98%   BMI 29.21 kg/m     Pertinent brief exam:   Examination of area of concern:   NAD  Ambulatory    Preliminary orders: CBC CMP mag phos EKG lipase bhcg trop CXR    Symptom based preliminary diagnosis/MDM: nausea, vomiting, flank pain missed dialysis    Patient advised to remain in the ED until further evaluation can be performed. Patient instructed to notify staff of any changes in condition while waiting.  This assessment is an initial evaluation to expedite care.

## 2022-07-08 NOTE — H&P (Signed)
[facility]      Patient: Katherine [name]  Date: 07/08/2022   DOB: [birth year]  Date of Admission: 07/08/2022   MRN: [id number]  Attending: Jonni Sanger, MD         Chief Complaint   Patient presents with    Flank Pain    Nausea    Emesis    Diarrhea        History Gathered From: Patient    HISTORY AND PHYSICAL     Katherine [name] is a 37 y.o. 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  She states that her last hemodialysis was last Tuesday and she has missed 4 hemodialysis sessions due to transportation issues. She complains of left flank pain and nausea on my evaluation and requests IV phenergan every 8 hours for nausea and dilaudid for pain control. Denies suspicious food intake or interaction with sick contacts. She complains of shortness of breath, lower extremity swelling, palpitations.   She has no complaints of fever, chills, palpitations, dysuria, hematuria. She reports noting blood streaks in diarrhea and vomitus. States she has a history of gastritis and has undergone EGD in the past.  Under chart review noted that [place] renal hemorrhage/hematoma found on 07/02/2022 and she left AMA.  Noted a CT abdomen pelvis without contrast on 07/02/2022 shows no acute findings in the abdomen pelvis and significant decrease size of left renal hematoma.    In the ED, blood pressure 184/104, afebrile, not tachycardic, not hypoxic.  Labs pertinent for hemoglobin of 8, BUN/creatinine 72/13.9, potassium 6.2, high-sensitivity troponin 14, NT proBNP 20,000.  Chest x-ray showed subtle hazy  opacity in bilateral lungs secondary to pulmonary edema.  Bilateral upper extremity venous Dopplers revealed no evidence of acute DVT.  Patient received hyperkalemia treatment with calcium gluconate, insulin/D10. Nephrology was consulted and recommendation to give Kayexalate 15 and IV Lasix 80. and admission requested.    Past Medical History:   Diagnosis Date    Acute post-hemorrhagic anemia     Anemia in chronic kidney disease (CKD)     ESRD (end stage renal disease)     ESRD on hemodialysis     Hyperkalemia     Hypertension     Left nephrolithiasis     Systemic lupus erythematosus        History reviewed. No pertinent surgical history.    Prior to Admission medications    Medication Sig Start Date End Date Taking? Authorizing Provider   amLODIPine (NORVASC) 10 MG tablet Take 1 tablet (10 mg) by mouth daily   Yes [provider]   apixaban (ELIQUIS) 2.5 MG Take 1 tablet (2.5 mg) by mouth every 12 (twelve) hours 05/27/22 05/27/23 Yes [provider]   hydrALAZINE (APRESOLINE) 25 MG tablet Take 1 tablet (25 mg) by mouth 3 (three) times daily 05/27/22 05/27/23 Yes [provider]   calcitRIOL (ROCALTROL) 0.25 MCG capsule Take 1 capsule (0.25 mcg) by mouth 04/14/21 07/08/22 Yes [provider]   losartan (COZAAR) 50 MG tablet Take 1 tablet (50 mg) by mouth daily 08/27/21 07/08/22 Yes [provider]   metoprolol succinate XL (Toprol XL) 25  MG 24 hr tablet Take 1 tablet (25 mg) by mouth daily 06/11/21 07/08/22 Yes [provider]       Allergies   Allergen Reactions    Ace Inhibitors Anaphylaxis    Acetaminophen Anaphylaxis    Fentanyl Anaphylaxis    Hydrocodone Anaphylaxis    Oxycodone Anaphylaxis    Sulfa Antibiotics Anaphylaxis    Nsaids      Contraindicated, dialysis py       History reviewed. No pertinent family history.    Social History     Tobacco Use    Smoking status: Never    Smokeless tobacco: Never   Vaping Use    Vaping status: Never Used   Substance Use Topics     Alcohol use: Never    Drug use: Never       REVIEW OF SYSTEMS     ROS: Pertinent positives and negatives as noted in the HPI. All other systems were reviewed and are negative.     PHYSICAL EXAM     Vital Signs (most recent): BP (!) 162/95   Pulse 85   Temp 98.5 F (36.9 C) (Oral)   Resp 15   Ht 1.676 m (5\' 6" )   Wt 82.1 kg (181 lb)   SpO2 99%   BMI 29.21 kg/m   Constiutional: NAD. Alert and cooperative. Nontoxic appearing.   HEENT: NCAT, PERRL, conjunctivae/corneas clear. No scleral icterus.   Neck: Supple, no meningismus  Cardiovascular: RRR, normal S1 S2, no murmur, no JVD  Respiratory: Normal rate. CTAB, unlabored respiratory effort, no wheezes, crackles.   Gastrointestinal: +BS, soft, mild tenderness, non-distended. No rebound, rigidity or guarding.   Genitourinary: + left costovertebral angle tenderness  Skin: Xerosis BLE. No rashes, jaundice or other lesions  Extremities: RUE AVF. Trace BLE edema. Pulses 2+. Capillary refill < 3s  Neurologic: AAOx3,. No focal deficits noted.   Strength and sensation equal and intact. Appropriately answering questions and following commands.   Patient speaks freely in full sentences. Normal speech.   Psychiatric: affect and mood appropriate. The patient is alert, interactive, appropriate.    Exam done by Jonni Sanger, MD on 07/08/22 at 7:39 PM      LABS & IMAGING     Recent Results (from the past 24 hour(s))   CBC and differential    Collection Time: 07/08/22  4:11 PM   Result Value Ref Range    WBC 3.33 3.10 - 9.50 x10 3/uL    Hgb 8.0 (L) 11.4 - 14.8 g/dL    Hematocrit 16.1 (L) 34.7 - 43.7 %    Platelets 156 142 - 346 x10 3/uL    RBC 2.72 (L) 3.90 - 5.10 x10 6/uL    MCV 90.4 78.0 - 96.0 fL    MCH 29.4 25.1 - 33.5 pg    MCHC 32.5 31.5 - 35.8 g/dL    RDW 16 (H) 11 - 15 %    MPV 11.2 8.9 - 12.5 fL    Instrument Absolute Neutrophil Count 1.86 1.10 - 6.33 x10 3/uL    Neutrophils 55.9 None %    Lymphocytes Automated 19.5 None %    Monocytes 11.7 None %    Eosinophils  Automated 10.8 None %    Basophils Automated 1.8 None %    Immature Granulocytes 0.3 None %    Nucleated RBC 0.0 0.0 - 0.0 /100 WBC    Neutrophils Absolute 1.86 1.10 - 6.33 x10 3/uL    Lymphocytes Absolute Automated 0.65 0.42 - 3.22  x10 3/uL    Monocytes Absolute Automated 0.39 0.21 - 0.85 x10 3/uL    Eosinophils Absolute Automated 0.36 0.00 - 0.44 x10 3/uL    Basophils Absolute Automated 0.06 0.00 - 0.08 x10 3/uL    Immature Granulocytes Absolute 0.01 0.00 - 0.07 x10 3/uL    Absolute NRBC 0.00 0.00 - 0.00 x10 3/uL   Comprehensive metabolic panel    Collection Time: 07/08/22  4:11 PM   Result Value Ref Range    Glucose 75 70 - 100 mg/dL    BUN 16.1 (H) 7.0 - 09.6 mg/dL    Creatinine 04.5 (H) 0.4 - 1.0 mg/dL    Sodium 409 811 - 914 mEq/L    Potassium 6.2 (HH) 3.5 - 5.3 mEq/L    Chloride 92 (L) 99 - 111 mEq/L    CO2 22 17 - 29 mEq/L    Calcium 7.4 (L) 8.5 - 10.5 mg/dL    Protein, Total 7.7 6.0 - 8.3 g/dL    Albumin 3.6 3.5 - 5.0 g/dL    AST (SGOT) 18 5 - 41 U/L    ALT 10 0 - 55 U/L    Alkaline Phosphatase 124 (H) 37 - 117 U/L    Bilirubin, Total 0.4 0.2 - 1.2 mg/dL    Globulin 4.1 (H) 2.0 - 3.6 g/dL    Albumin/Globulin Ratio 0.9 0.9 - 2.2    Anion Gap 22.0 (H) 5.0 - 15.0    eGFR 3.2 (A) >=60 mL/min/1.73 m2   Lipase    Collection Time: 07/08/22  4:11 PM   Result Value Ref Range    Lipase 37 8 - 78 U/L   Beta HCG Quantitative, Pregnancy    Collection Time: 07/08/22  4:11 PM   Result Value Ref Range    hCG, Quant. <2.4 See Below mIU/mL   High Sensitivity Troponin-I at 0 hrs    Collection Time: 07/08/22  4:11 PM   Result Value Ref Range    hs Troponin-I 14.6 (A) SEE BELOW ng/L   NT-proBNP    Collection Time: 07/08/22  4:11 PM   Result Value Ref Range    NT-proBNP 20,188 (H) 0 - 125 pg/mL   Magnesium    Collection Time: 07/08/22  4:11 PM   Result Value Ref Range    Magnesium 2.0 1.6 - 2.6 mg/dL   Phosphorus    Collection Time: 07/08/22  4:11 PM   Result Value Ref Range    Phosphorus 4.1 2.3 - 4.7 mg/dL   Glucose Whole  Blood - POCT    Collection Time: 07/08/22  7:19 PM   Result Value Ref Range    Whole Blood Glucose POCT 73 70 - 100 mg/dL       MICROBIOLOGY:  Blood Culture:NA  Urine Culture: NA    IMAGING  Upon my review:   US Venous Dopp Upper Extrem Comp Bilat   Final Result       1. No evidence of acute DVT on either side.   2. Abnormalities on the right due to a fistula for dialysis. There is a   patent stent in the cephalic vein.   3. The left basilic vein is not seen and may be chronically occluded.      Wynema Birch, MD   07/08/2022 6:56 PM      XR Chest  AP Portable   Final Result      Subtle hazy opacities in bilateral lungs can be secondary to pulmonary   edema. Superimposed infection not  excluded.      Nonda Lou, MD   07/08/2022 4:15 PM            CARDIAC:  EKG Interpretation (upon my review): Sinus rhythm with PACs, heart rate 82, normal intervals, no acute ST-T changes per my read    Markers:  Recent Labs   Lab 07/08/22  1611   hs Troponin-I 14.6*       EMERGENCY DEPARTMENT COURSE:  Orders Placed This Encounter   Procedures    XR Chest  AP Portable    US Venous Dopp Upper Extrem Comp Bilat    CBC and differential    Comprehensive metabolic panel    Lipase    Urinalysis Reflex to Microscopic Exam- Reflex to Culture    Beta HCG Quantitative, Pregnancy    Magnesium    Phosphorus    High Sensitivity Troponin-I at 0 hrs    High Sensitivity Troponin-I at 2 hrs with calculated Delta    NT-proBNP    Magnesium    Phosphorus    CBC WITH MANUAL DIFFERENTIAL    Comprehensive metabolic panel    Magnesium    Phosphorus    Basic Metabolic Panel    Adult diet Diet type: Therapeutic/ Modified; Texture/ liquid modification: Solid; Solid texture/consistency: Regular (IDDSI level 7); Liquid texture/consistency: Thin (IDDSI level 0)    NSG Communication: Glucose POCT order    Institute Hypoglycemia Protocol    Vital signs    Pulse Oximetry    Progressive Mobility Protocol    Notify physician    I/O    Height    Weight    Skin  assessment    Notify physician (Critical Blood Glucose Value)    Notify physician (Communication: Document Abnormal Blood Glucose)    POCT order (PRN hypoglycemia)    Adult Hypoglycemia Treatment Algorithm    Place sequential compression device    Maintain sequential compression device    Education: Activity    Education: Disease Process & Condition    Education: Pain Management    Education: Falls Risk    Education: Smoking Cessation    No antipyretics    Notify physician    Notify physician for Signs/Symptoms of Bleeding    Apply Anticoagulation Armband    Weigh patient    Assess if patient received anticoagulants in the last 12 hours    Education: Anticoagulation with apixaban (Eliquis)    Telemetry Box Communication    Full Code    ED Unit Sec Comm Order    Glucose Whole Blood - POCT    ECG 12 lead    Saline lock IV    Adult Admit to Inpatient    BLEEDING PRECAUTIONS       ASSESSMENT & PLAN     Katherine [name] is a 37 y.o. female admitted with Hyperkalemia.    Patient Active Hospital Problem List:         Hyperkalemia, POA  Elevated anion gap  Volume overload  ESRD on HD  History of lupus nephritis  Missed hemodialysis  Anemia of chronic kidney disease  CO2 22, K 6.2 on presentation  Baseline hemoglobin in the 8 range  Chest x-ray shows bilateral opacities consistent with pulmonary edema  NT proBNP is elevated  Status post hyperkalemia treatment  Nephrology has been consulted and patient will undergo hemodialysis tonight versus tomorrow depending on BMP. Dr. Candi Leash recommended kayexalate 15 and IV lasix 80 mg  Repeat BMP now  As needed supplemental oxygen for goal O2  sats greater than 92%    Hypertensive urgency  Systolic blood pressure 180s on arrival likely due to volume overload  Continue home amlodipine 10, toprol XL 25, and hydralazine 25 TID    History of gastritis  Continue home PPI    Left flank pain  Recent left renal infarction status post embolization followed by renal hematoma in February  2024   -Noted a CT abdomen pelvis without contrast on 07/02/2022 shows no acute findings abdomen pelvis and significant decrease size of left renal hematoma  -Continue home eliquis 2.5 BID  -PRN pain control    Nausea/Vomiting/Diarrhea  Lipase, beta HCG negative.  Mild elevation of ALP  CT abdomen pelvis done a few days ago in [place] shows no acute findings in the abdomen and pelvis and significant decrease in size of left renal hematoma  U/A pending  As needed antiemetics      Patient has BMI=Body mass index is 29.21 kg/m.  Diagnosis: Overweight based on BMI criteria          Recent Labs     07/08/22  1611   Potassium 6.2*     Diagnosis: Hyperkalemia        Recent Labs   Lab 07/08/22  1611   Hgb 8.0*   Hematocrit 24.6*   MCV 90.4   WBC 3.33   Platelets 156         Anemia Diagnosis: Chronic: Anemia of Chronic Disease    Nutrition: Renal    DVT/VTE Prophylaxis:   Current Facility-Administered Medications (Includes Only Anticoagulants, Misc. Hematological)   Medication Dose Route Last Admin    apixaban (ELIQUIS) tablet 2.5 mg  2.5 mg Oral         Code Status: Full Code    Patient Class: INPATIENT. Inpatient status is judged to be reasonable and necessary in order to provide the required intensity of service to ensure the patient's safety. The patient's presenting symptoms, physical exam findings, and initial radiographic and laboratory data in the context of their chronic comorbidities is felt to place them at high risk for further clinical deterioration. Furthermore, it is not anticipated that the patient will be medically stable for discharge from the hospital within 2 midnights of admission. The following factors support the admission status of inpatient: the patient's presenting symptoms of nausea vomiting diarrhea, worrisome physical exam findings of  , worrisome laboratory data of K6.2, elevated creatinine, and significant comorbidities including ESRD, lupus nephritis, left renal infarction.    I certify  that at the point of admission it is my clinical judgment that the patient will require inpatient hospital care spanning beyond 2 midnights from the point of admission due to high intensity of service, high risk for further deterioration and high frequency of surveillance required.     Anticipated medical stability for discharge: 24 - 48 Hours      Signed,  Jonni Sanger, MD    07/08/2022 9:08 PM  Time Elapsed: 69 minutes

## 2022-07-08 NOTE — ED Notes (Signed)
[facility] EMERGENCY DEPARTMENT  ED NURSING NOTE FOR THE RECEIVING INPATIENT NURSE   ED NURSE Clovis Pu [phone]   ED CHARGE RN [phone]   ADMISSION INFORMATION   Katherine [name] is a 37 y.o. female admitted with a diagnosis of:    1. Hyperkalemia         Isolation: None   Allergies: Ace inhibitors, Acetaminophen, Fentanyl, Hydrocodone, Oxycodone, Sulfa antibiotics, and Nsaids   Holding Orders confirmed? Yes   Belongings Documented? Yes   Home medications sent to pharmacy confirmed? No   NURSING CARE   Patient Comes From:   Mental Status: Home Independent  alert and oriented   ADL: Unable to Assess   Ambulation: Unable to assess   Pertinent Information  and Safety Concerns: Pt has dialysis catheter in the R. arm     COVID Test sent to lab? No   VITAL SIGNS   Time BP Temp Pulse Resp SpO2   1840 162/95 98.5 85 15 99   CT / NIH   CT Head ordered on this patient?  No   NIH/Dysphagia assessment done prior to admission? No   PERSONAL PROTECTIVE EQUIPMENT   Gloves and Mask   LAB RESULTS   Labs Reviewed   CBC AND DIFFERENTIAL - Abnormal; Notable for the following components:       Result Value    Hgb 8.0 (*)     Hematocrit 24.6 (*)     RBC 2.72 (*)     RDW 16 (*)     All other components within normal limits   COMPREHENSIVE METABOLIC PANEL - Abnormal; Notable for the following components:    BUN 72.0 (*)     Creatinine 13.9 (*)     Potassium 6.2 (*)     Chloride 92 (*)     Calcium 7.4 (*)     Alkaline Phosphatase 124 (*)     Globulin 4.1 (*)     Anion Gap 22.0 (*)     eGFR 3.2 (*)     All other components within normal limits   HIGH SENSITIVITY TROPONIN-I - Abnormal; Notable for the following components:    hs Troponin-I 14.6 (*)     All other components within normal limits   PROBNP - Abnormal; Notable for the following components:    NT-proBNP 20,188 (*)     All other components within normal limits   LIPASE   BETA HCG QUANTITATIVE, PREGNANCY   MAGNESIUM   PHOSPHORUS   URINALYSIS REFLEX TO MICROSCOPIC EXAM -  REFLEX TO CULTURE   MAGNESIUM   PHOSPHORUS   HIGH SENSITIVITY TROPONIN-I WITH DELTA          Ticket to Ride Printed: Yes

## 2022-07-09 LAB — MAGNESIUM: Magnesium: 1.9 mg/dL (ref 1.6–2.6)

## 2022-07-09 LAB — COMPREHENSIVE METABOLIC PANEL
ALT: 6 U/L (ref 0–55)
AST (SGOT): 13 U/L (ref 5–41)
Albumin/Globulin Ratio: 1 (ref 0.9–2.2)
Albumin: 3.1 g/dL — ABNORMAL LOW (ref 3.5–5.0)
Alkaline Phosphatase: 121 U/L — ABNORMAL HIGH (ref 37–117)
Anion Gap: 17 — ABNORMAL HIGH (ref 5.0–15.0)
BUN: 75 mg/dL — ABNORMAL HIGH (ref 7.0–21.0)
Bilirubin, Total: 0.4 mg/dL (ref 0.2–1.2)
CO2: 25 mEq/L (ref 17–29)
Calcium: 6.9 mg/dL — ABNORMAL LOW (ref 8.5–10.5)
Chloride: 94 mEq/L — ABNORMAL LOW (ref 99–111)
Creatinine: 14.5 mg/dL — ABNORMAL HIGH (ref 0.4–1.0)
Globulin: 3.1 g/dL (ref 2.0–3.6)
Glucose: 79 mg/dL (ref 70–100)
Potassium: 5.4 mEq/L — ABNORMAL HIGH (ref 3.5–5.3)
Protein, Total: 6.2 g/dL (ref 6.0–8.3)
Sodium: 136 mEq/L (ref 135–145)
eGFR: 3.1 mL/min/{1.73_m2} — AB (ref >=60)

## 2022-07-09 LAB — BASIC METABOLIC PANEL
Anion Gap: 18 — ABNORMAL HIGH (ref 5.0–15.0)
BUN: 74 mg/dL — ABNORMAL HIGH (ref 7.0–21.0)
CO2: 26 mEq/L (ref 17–29)
Calcium: 7.2 mg/dL — ABNORMAL LOW (ref 8.5–10.5)
Chloride: 92 mEq/L — ABNORMAL LOW (ref 99–111)
Creatinine: 14.4 mg/dL — ABNORMAL HIGH (ref 0.4–1.0)
Glucose: 108 mg/dL — ABNORMAL HIGH (ref 70–100)
Potassium: 4.9 mEq/L (ref 3.5–5.3)
Sodium: 136 mEq/L (ref 135–145)
eGFR: 3.1 mL/min/{1.73_m2} — AB (ref >=60)

## 2022-07-09 LAB — CBC WITH MANUAL DIFFERENTIAL
Absolute NRBC: 0 10*3/uL (ref 0.00–0.00)
Atypical Lymphocytes %: 2 %
Atypical Lymphocytes Absolute: 0.07 10*3/uL — ABNORMAL HIGH (ref 0.00–0.00)
Band Neutrophils Absolute: 0.07 10*3/uL (ref 0.00–1.00)
Band Neutrophils: 2 %
Basophils Absolute Manual: 0 10*3/uL (ref 0.00–0.08)
Basophils Manual: 0 %
Cell Morphology: ABNORMAL — AB
Eosinophils Absolute Manual: 0.42 10*3/uL (ref 0.00–0.44)
Eosinophils Manual: 12 %
Hematocrit: 22 % — ABNORMAL LOW (ref 34.7–43.7)
Hgb: 7.1 g/dL — ABNORMAL LOW (ref 11.4–14.8)
Lymphocytes Absolute Manual: 0.77 10*3/uL (ref 0.42–3.22)
Lymphocytes Manual: 22 %
MCH: 29 pg (ref 25.1–33.5)
MCHC: 32.3 g/dL (ref 31.5–35.8)
MCV: 89.8 fL (ref 78.0–96.0)
MPV: 9.7 fL (ref 8.9–12.5)
Monocytes Absolute: 0.17 10*3/uL — ABNORMAL LOW (ref 0.21–0.85)
Monocytes Manual: 5 %
Neutrophils Absolute Manual: 1.98 10*3/uL (ref 1.10–6.33)
Nucleated RBC: 0 /100 WBC (ref 0.0–0.0)
Platelet Estimate: DECREASED — AB
Platelets: 117 10*3/uL — ABNORMAL LOW (ref 142–346)
RBC: 2.45 10*6/uL — ABNORMAL LOW (ref 3.90–5.10)
RDW: 15 % (ref 11–15)
Segmented Neutrophils: 57 %
WBC: 3.48 10*3/uL (ref 3.10–9.50)

## 2022-07-09 LAB — HEMOGLOBIN AND HEMATOCRIT
Hematocrit: 21.7 % — ABNORMAL LOW (ref 34.7–43.7)
Hgb: 7.4 g/dL — ABNORMAL LOW (ref 11.4–14.8)

## 2022-07-09 LAB — CBC
Absolute NRBC: 0 10*3/uL (ref 0.00–0.00)
Hematocrit: 22.7 % — ABNORMAL LOW (ref 34.7–43.7)
Hgb: 7.5 g/dL — ABNORMAL LOW (ref 11.4–14.8)
MCH: 29.4 pg (ref 25.1–33.5)
MCHC: 33 g/dL (ref 31.5–35.8)
MCV: 89 fL (ref 78.0–96.0)
MPV: 10.9 fL (ref 8.9–12.5)
Nucleated RBC: 0 /100 WBC (ref 0.0–0.0)
Platelets: 141 10*3/uL — ABNORMAL LOW (ref 142–346)
RBC: 2.55 10*6/uL — ABNORMAL LOW (ref 3.90–5.10)
RDW: 15 % (ref 11–15)
WBC: 3.55 10*3/uL (ref 3.10–9.50)

## 2022-07-09 LAB — HIGH SENSITIVITY TROPONIN-I WITH DELTA: hs Troponin-I: 14.7 ng/L — AB

## 2022-07-09 LAB — ECG 12-LEAD
Atrial Rate: 82 {beats}/min
P Axis: 41 degrees
P-R Interval: 192 ms
Q-T Interval: 394 ms
QRS Duration: 102 ms
QTC Calculation (Bezet): 460 ms
R Axis: 29 degrees
T Axis: 45 degrees
Ventricular Rate: 82 {beats}/min

## 2022-07-09 LAB — HEPATITIS B (HBV) SURFACE ANTIGEN WITH REFLEX TO CONFIRMATION: Hepatitis B Surface Antigen: NONREACTIVE

## 2022-07-09 LAB — PHOSPHORUS: Phosphorus: 5 mg/dL — ABNORMAL HIGH (ref 2.3–4.7)

## 2022-07-09 MED ORDER — SODIUM CHLORIDE 0.9 % IV BOLUS
100.0000 mL | INTRAVENOUS | Status: AC | PRN
Start: 2022-07-09 — End: 2022-07-09

## 2022-07-09 MED ORDER — SODIUM CHLORIDE 0.9 % IV BOLUS
250.0000 mL | INTRAVENOUS | Status: AC | PRN
Start: 2022-07-09 — End: 2022-07-09

## 2022-07-09 MED ORDER — NYSTATIN 100000 UNIT/GM EX CREA
TOPICAL_CREAM | Freq: Two times a day (BID) | CUTANEOUS | Status: DC
Start: 2022-07-09 — End: 2022-07-11
  Filled 2022-07-09 (×2): qty 15

## 2022-07-09 NOTE — UM Notes (Signed)
PATIENT NAME: [name],[name]   DOB: 09-Jun-1985     Adult Admit to Inpatient (Order [id number]) on 07/08/22     Admit Diagnosis: Hyperkalemia [E87.5]  Facility: [facility]    37 y.o. female with history of lupus nephritis, end-stage renal disease currently on T/T/S hemodialysis, hypertension, recent left renal infarction status post embolization followed by renal hematoma in February 2024 for currently on Eliquis, SVC syndrome, visiting from [place] who presents to the ED for evaluation of 1 week history of progressive worsening nausea with vomiting and diarrhea along with left flank pain.  She states that her last hemodialysis was last Tuesday and she has missed 4 hemodialysis sessions due to transportation issues. She complains of left flank pain and nausea on my evaluation and requests IV phenergan every 8 hours for nausea and dilaudid for pain control. She complains of shortness of breath, lower extremity swelling, palpitations.   She reports noting blood streaks in diarrhea and vomitus.     In the ED, blood pressure 184/104, afebrile, not tachycardic, not hypoxic.  Labs pertinent for hemoglobin of 8, BUN/creatinine 72/13.9, potassium 6.2, high-sensitivity troponin 14, NT proBNP 20,000.  Chest x-ray showed subtle hazy opacity in bilateral lungs secondary to pulmonary edema.  Bilateral upper extremity venous Dopplers revealed no evidence of acute DVT.  Patient received hyperkalemia treatment with calcium gluconate, insulin/D10. Nephrology was consulted and recommendation to give Kayexalate 15 and IV Lasix 80. and admission requested.    Vital Signs (most recent): BP (!) 162/95   Pulse 85   Temp 98.5 F (36.9 C) (Oral)   Resp 15   Ht 1.676 m (5\' 6" )   Wt 82.1 kg (181 lb)   SpO2 99%   BMI 29.21 kg/m     Temp:  [97.9 F (36.6 C)-98.9 F (37.2 C)]   Heart Rate:  [77-97]   Resp Rate:  [15-19]   BP: (127-184)/(81-115)   SpO2:  [92 %-100 %]   Height:  [167.6 cm (5' 5.98")-167.6  cm (5\' 6" )]   Weight:  [82.1 kg (181 lb)]   BMI (calculated):  [29.2]     Last recorded pain score:  Pain Scale Used: Numeric Scale (0-10)  Pain Score: 5-moderate pain      Procedure Component Value Units Date/Time    CBC WITH MANUAL DIFFERENTIAL [[id number]]  (Abnormal) Collected: 07/09/22 0454     Hgb 7.1 g/dL      Hematocrit 78.2 %      Platelets 117 x10 3/uL     High Sensitivity Troponin-I at 2 hrs with calculated Delta [[id number]]  (Abnormal) Collected: 07/09/22 0008    Specimen: Blood Updated: 07/09/22 0107     hs Troponin-I 14.7 ng/L     CBC without differential [[id number]]  (Abnormal) Collected: 07/09/22 0008     Hgb 7.5 g/dL      Hematocrit 52.8 %      Platelets 141 x10 3/uL     NT-proBNP [[id number]]  (Abnormal) Collected: 07/08/22 1611     Updated: 07/08/22 1718     NT-proBNP 20,188 pg/mL     Comprehensive metabolic panel [[id number]]  (Abnormal) Collected: 07/08/22 1611     BUN 72.0 mg/dL      Creatinine 03.4 mg/dL      Potassium 6.2 mEq/L      Alkaline Phosphatase 124 U/L      eGFR 3.2 mL/min/1.73 m2     High Sensitivity Troponin-I at 0 hrs [[id number]]  (Abnormal) Collected: 07/08/22 1611    Specimen:  Blood Updated: 07/08/22 1705     hs Troponin-I 14.6 ng/L      Scheduled Meds:  Current Facility-Administered Medications   Medication Dose Route Frequency    amLODIPine  10 mg Oral Daily    apixaban  2.5 mg Oral Q12H SCH    calcium acetate  1,334 mg Oral TID MEALS    hydrALAZINE  25 mg Oral TID    insulin regular (HumuLIN R) 5 Units intravenous injection  5 Units Intravenous Once    metoprolol succinate XL  25 mg Oral Daily    pantoprazole  40 mg Oral QAM AC     PRN Meds: HYDROmorphone po x 3, ondansetron iv x 1, promethazine (PHENERGAN) IVPB x 2      Plan of Care  Hyperkalemia, POA  Elevated anion gap  Volume overload  ESRD on HD  History of lupus nephritis  Missed hemodialysis  Anemia of chronic kidney disease  CO2 22, K 6.2 on presentation  Baseline hemoglobin in the 8 range  Chest x-ray shows bilateral  opacities consistent with pulmonary edema  NT proBNP is elevated  Status post hyperkalemia treatment  Nephrology has been consulted and patient will undergo hemodialysis tonight versus tomorrow depending on BMP. Dr. Candi Leash recommended kayexalate 15 and IV lasix 80 mg  Repeat BMP now  As needed supplemental oxygen for goal O2 sats greater than 92%     Left flank pain  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  Mild elevation of ALP  CT abdomen pelvis done a few days ago in [place] shows no acute findings in the abdomen and pelvis and significant decrease in size of left renal hematoma  U/A pending  As needed antiemetics      Primary Coverage:  Payor: [employer]ET[employer] / Plan: [employer] EXCHANGE / Product Type: COMMERCIAL /     UTILIZATION REVIEW CONTACT: Kennon Rounds RN, BSN  Utilization Review   [facility] [employer]  [phone]  [phone]  Email: [email or web address]  Tax ID:  [id number]         NOTES TO REVIEWER:    This clinical review is based on/compiled from documentation provided by the treatment team within the patient's medical record.

## 2022-07-09 NOTE — Consults (Signed)
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 female who presents to the hospital on 07/08/2022 with shortness of breath and flank pain reporting missing 4 sessions of hemodialysis due to transportation issues    The patient was found to have hyperkalemia yesterday in ER, responded well to medical management, also the patient had hemodialysis earlier today.    Past Medical History:     Past Medical History:   Diagnosis Date    Acute post-hemorrhagic anemia     Anemia in chronic kidney disease (CKD)     ESRD (end stage renal disease)     ESRD on hemodialysis     Hyperkalemia     Hypertension     Left nephrolithiasis     Systemic lupus erythematosus        Past Surgical History:   History reviewed. No pertinent surgical history.    Family History:   History reviewed. No pertinent family history.    Social History:     Social History     Socioeconomic History    Marital status: Single     Spouse name: Not on file    Number of children: Not on file     Years of education: Not on file    Highest education level: Not on file   Occupational History    Not on file   Tobacco Use    Smoking status: Never    Smokeless tobacco: Never   Vaping Use    Vaping status: Never Used   Substance and Sexual Activity    Alcohol use: Never    Drug use: Never    Sexual activity: Not on file   Other Topics Concern    Not on file   Social History Narrative    Not on file     Social Determinants of Health     Financial Resource Strain: High Risk (06/12/2022)    Received from [facility]    Overall Financial Resource Strain (CARDIA)     Difficulty of Paying Living Expenses: Hard   Food Insecurity: No Food Insecurity (07/09/2022)    Hunger Vital Sign     Worried About Running Out of Food in the Last Year: Never true     Ran Out of Food in the Last Year: Never true   Recent Concern: Food Insecurity - Food Insecurity Present (06/12/2022)    Received from [facility]  Hunger Vital Sign     Worried About Running Out of Food in the Last Year: Often true     Ran Out of Food in the Last Year: Often true   Transportation Needs: No Transportation Needs (07/09/2022)    PRAPARE - Therapist, art (Medical): No     Lack of Transportation (Non-Medical): No   Physical Activity: Not on file   Stress: Stress Concern Present (11/05/2020)    Received from [facility] - Occupational Stress Questionnaire     Feeling of Stress : To some extent   Social Connections: Unknown (12/20/2021)    Received from [facility]    Social Connections     Frequency of Communication with Friends and Family: Not asked     Frequency of Social Gatherings with Friends and Family: Not asked   Intimate Partner Violence: Not At Risk (07/09/2022)    Humiliation, Afraid, Rape, and Kick questionnaire     Fear of Current or Ex-Partner: No     Emotionally Abused: No     Physically Abused: No     Sexually Abused: No    Housing Stability: Low Risk  (07/09/2022)    Housing Stability Vital Sign     Unable to Pay for Housing in the Last Year: No     Number of Places Lived in the Last Year: 1     Unstable Housing in the Last Year: No       Allergies:     Allergies   Allergen Reactions    Ace Inhibitors Anaphylaxis    Acetaminophen Anaphylaxis    Fentanyl Anaphylaxis    Hydrocodone Anaphylaxis    Oxycodone Anaphylaxis    Sulfa Antibiotics Anaphylaxis    Nsaids      Contraindicated, dialysis py       Medications:     Current Facility-Administered Medications   Medication Dose Route Frequency    amLODIPine  10 mg Oral Daily    apixaban  2.5 mg Oral Q12H SCH    calcium acetate  1,334 mg Oral TID MEALS    hydrALAZINE  25 mg Oral TID    insulin regular (HumuLIN R) 5 Units intravenous injection  5 Units Intravenous Once    metoprolol succinate XL  25 mg Oral Daily    nystatin   Topical BID    pantoprazole  40 mg Oral QAM AC       Review of Systems:   Review of Systems   Constitutional: Negative for decreased appetite and fever.   Cardiovascular:  Positive for dyspnea on exertion and leg swelling. Negative for chest pain.   Respiratory:  Positive for shortness of breath. Negative for cough.    Musculoskeletal:  Negative for back pain.   Gastrointestinal:  Negative for abdominal pain.          Physical Exam:     Vitals:    07/09/22 1257   BP: 141/88   Pulse: 80   Resp: 16   Temp: 98.4 F (36.9 C)   SpO2: 100%       Intake/Output Summary (Last 24 hours) at 07/09/2022 1708  Last data filed at 07/09/2022 1257  Gross per 24 hour   Intake --   Output 3600 ml   Net -3600 ml       General: NAD, Alert and awake  Neck: No JVP  Neuro: AOx3  Extremities: 1+ peripheral edema  Labs Reviewed:     Results       Procedure Component Value Units Date/Time    Hepatitis B (HBV) Surface Antigen w/ Reflex to Confirmation [[id number]] Collected: 07/09/22 0454    Specimen: Blood Updated: 07/09/22 1530     Hepatitis B Surface Antigen Non-Reactive    Hemoglobin and  hematocrit, blood [[id number]]  (Abnormal) Collected: 07/09/22 1012    Specimen: Blood Updated: 07/09/22 1022     Hgb 7.4 g/dL      Hematocrit 78.2 %     CBC WITH MANUAL DIFFERENTIAL [[id number]]  (Abnormal) Collected: 07/09/22 0454     Updated: 07/09/22 0556     WBC 3.48 x10 3/uL      Hgb 7.1 g/dL      Hematocrit 57.8 %      Platelets 117 x10 3/uL      RBC 2.45 x10 6/uL      MCV 89.8 fL      MCH 29.0 pg      MCHC 32.3 g/dL      RDW 15 %      MPV 9.7 fL      Nucleated RBC 0.0 /100 WBC      Absolute NRBC 0.00 x10 3/uL      Segmented Neutrophils 57 %      Band Neutrophils 2 %      Lymphocytes Manual 22 %      Monocytes Manual 5 %      Eosinophils Manual 12 %      Basophils Manual 0 %      Atypical Lymphocytes % 2 %      Neutrophils Absolute Manual 1.98 x10 3/uL      Band Neutrophils Absolute 0.07 x10 3/uL      Lymphocytes Absolute Manual 0.77 x10 3/uL      Monocytes Absolute 0.17 x10 3/uL      Eosinophils Absolute Manual 0.42 x10 3/uL      Basophils Absolute Manual 0.00 x10 3/uL      Atypical Lymphocytes Absolute 0.07 x10 3/uL      Cell Morphology Abnormal     Platelet Estimate Decreased     Hypochromia 2+     Schistocytes 1+     Ovalocytes Present     Spur Cells 1+    Comprehensive metabolic panel [[id number]]  (Abnormal) Collected: 07/09/22 0454    Specimen: Blood Updated: 07/09/22 0533     Glucose 79 mg/dL      BUN 41.3 mg/dL      Creatinine 24.4 mg/dL      Sodium 010 mEq/L      Potassium 5.4 mEq/L      Chloride 94 mEq/L      CO2 25 mEq/L      Calcium 6.9 mg/dL      Protein, Total 6.2 g/dL      Albumin 3.1 g/dL      AST (SGOT) 13 U/L      ALT 6 U/L      Alkaline Phosphatase 121 U/L      Bilirubin, Total 0.4 mg/dL      Globulin 3.1 g/dL      Albumin/Globulin Ratio 1.0     Anion Gap 17.0     eGFR 3.1 mL/min/1.73 m2     Magnesium [[id number]] Collected: 07/09/22 0454    Specimen: Blood Updated: 07/09/22 0533     Magnesium 1.9 mg/dL     Phosphorus [[id number]]  (Abnormal) Collected: 07/09/22 0454    Specimen: Blood Updated:  07/09/22  0533     Phosphorus 5.0 mg/dL     Basic Metabolic Panel [[id number]]  (Abnormal) Collected: 07/09/22 0008    Specimen: Blood Updated: 07/09/22 0121     Glucose 108 mg/dL      BUN 40.9 mg/dL      Creatinine 81.1 mg/dL      Calcium 7.2 mg/dL      Sodium 914 mEq/L      Potassium 4.9 mEq/L      Chloride 92 mEq/L      CO2 26 mEq/L      Anion Gap 18.0     eGFR 3.1 mL/min/1.73 m2     High Sensitivity Troponin-I at 2 hrs with calculated Delta [[id number]]  (Abnormal) Collected: 07/09/22 0008    Specimen: Blood Updated: 07/09/22 0107     hs Troponin-I 14.7 ng/L      hs Troponin-I Delta calc n/a ng/L     CBC without differential [[id number]]  (Abnormal) Collected: 07/09/22 0008    Specimen: Blood Updated: 07/09/22 0042     WBC 3.55 x10 3/uL      Hgb 7.5 g/dL      Hematocrit 62.9 %      Platelets 141 x10 3/uL      RBC 2.55 x10 6/uL      MCV 89.0 fL      MCH 29.4 pg      MCHC 33.0 g/dL      RDW 15 %      MPV 10.9 fL      Nucleated RBC 0.0 /100 WBC      Absolute NRBC 0.00 x10 3/uL     Basic Metabolic Panel [[id number]]  (Abnormal) Collected: 07/08/22 2046    Specimen: Blood Updated: 07/08/22 2201     Glucose 63 mg/dL      BUN 01.0 mg/dL      Creatinine 27.2 mg/dL      Calcium 7.3 mg/dL      Sodium 536 mEq/L      Potassium 6.2 mEq/L      Chloride 94 mEq/L      CO2 20 mEq/L      Anion Gap 20.0     eGFR 3.2 mL/min/1.73 m2     Glucose Whole Blood - POCT [[id number]] Collected: 07/08/22 2141     Updated: 07/08/22 2143     Whole Blood Glucose POCT 74 mg/dL     Glucose Whole Blood - POCT [[id number]] Collected: 07/08/22 1919     Updated: 07/08/22 1922     Whole Blood Glucose POCT 73 mg/dL     Beta HCG Quantitative, Pregnancy [[id number]] Collected: 07/08/22 1611     Updated: 07/08/22 1728     hCG, Quant. <2.4 mIU/mL     NT-proBNP [[id number]]  (Abnormal) Collected: 07/08/22 1611     Updated: 07/08/22 1718     NT-proBNP 20,188 pg/mL     Comprehensive metabolic panel [[id number]]  (Abnormal) Collected: 07/08/22 1611    Specimen: Blood  Updated: 07/08/22 1713     Glucose 75 mg/dL      BUN 57.3 mg/dL      Creatinine 22.0 mg/dL      Sodium 254 mEq/L      Potassium 6.2 mEq/L      Chloride 92 mEq/L      CO2 22 mEq/L      Calcium 7.4 mg/dL      Protein, Total 7.7 g/dL      Albumin 3.6 g/dL      AST (SGOT) 18 U/L  ALT 10 U/L      Alkaline Phosphatase 124 U/L      Bilirubin, Total 0.4 mg/dL      Globulin 4.1 g/dL      Albumin/Globulin Ratio 0.9     Anion Gap 22.0     eGFR 3.2 mL/min/1.73 m2     Lipase [[id number]] Collected: 07/08/22 1611    Specimen: Blood Updated: 07/08/22 1713     Lipase 37 U/L     Magnesium [[id number]] Collected: 07/08/22 1611     Updated: 07/08/22 1713     Magnesium 2.0 mg/dL     Phosphorus [[id number]] Collected: 07/08/22 1611     Updated: 07/08/22 1713     Phosphorus 4.1 mg/dL             Recent Labs     07/09/22  1012 07/09/22  0454 07/09/22  0008   WBC  --  3.48 3.55   Hgb 7.4* 7.1* 7.5*   Hematocrit 21.7* 22.0* 22.7*   Platelets  --  117* 141*       Recent Labs     07/09/22  0454 07/09/22  0008 07/08/22  2046 07/08/22  1611   Sodium 136 136   < > 136   Potassium 5.4* 4.9   < > 6.2*   Chloride 94* 92*   < > 92*   CO2 25 26   < > 22   BUN 75.0* 74.0*   < > 72.0*   Creatinine 14.5* 14.4*   < > 13.9*   Glucose 79 108*   < > 75   Calcium 6.9* 7.2*   < > 7.4*   Magnesium 1.9  --   --  2.0   Phosphorus 5.0*  --   --  4.1    < > = values in this interval not displayed.       Recent Labs     07/09/22  0454 07/08/22  1611   AST (SGOT) 13 18   ALT 6 10   Alkaline Phosphatase 121* 124*   Protein, Total 6.2 7.7   Albumin 3.1* 3.6       No results for input(s): "PTT", "PT", "INR" in the last 72 hours.    Rads:     Radiology Results (24 Hour)       Procedure Component Value Units Date/Time    US Venous Dopp Upper Extrem Comp Bilat [[id number]] Collected: 07/08/22 1852    Order Status: Completed Updated: 07/08/22 1922    Narrative:      HISTORY: Neck and left upper extremity edema.    COMPARISON: None.    TECHNIQUE: Wallace Cullens scale, color flow, and  spectral Doppler waveform analysis  was performed on the bilateral upper extremity veins described below. There  is normal compressibility (where possible), phasic flow, and response to  augmentation unless otherwise noted.    FINDINGS:  Right upper extremity:    There is an fistula for dialysis in the right upper extremity, resulting in  abnormal pulsatile venous waveforms and abnormal venous distention.    Internal jugular vein: Patent  Brachiocephalic vein: Patent  Subclavian vein: Patent  Axillary vein: Patent  Brachial veins: Patent  Basilic vein: Patent  Cephalic vein: Contains a patent stent.    Left upper extremity:    Internal jugular vein: Normal  Brachiocephalic vein: Normal  Subclavian vein: Normal  Axillary vein: Normal  Brachial veins: Normal  Basilic vein: Not seen, possibly chronically occluded. No acute thrombosis.  Cephalic vein: Very small.  Patent.      Impression:         1. No evidence of acute DVT on either side.  2. Abnormalities on the right due to a fistula for dialysis. There is a  patent stent in the cephalic vein.  3. The left basilic vein is not seen and may be chronically occluded.    Wynema Birch, MD  07/08/2022 6:56 PM            Signed by: Meredeth Ide

## 2022-07-09 NOTE — Plan of Care (Signed)
Problem: Patient Receiving Advanced Renal Therapies  Goal: Therapy access site remains intact  Flowsheets (Taken 07/09/2022 [phone])  Therapy access site remains intact: Assess therapy access site     Problem: Renal Instability  Goal: Fluid and electrolyte balance are achieved/maintained  Flowsheets (Taken 07/09/2022 0938)  Fluid and electrolyte balance are achieved/maintained:   Monitor/assess lab values and report abnormal values   Follow fluid restrictions/IV/PO parameters   Observe for cardiac arrhythmias   Assess and reassess fluid and electrolyte status

## 2022-07-09 NOTE — Progress Notes (Signed)
Uf of 3.6 liters. Tolerated well. Pt. Face without puffiness. Bilateral lower extremity edema 1+. No acute sob noted. Lungs clear. Sat 100% on RA. Pt. Stated her breathing was "ok.". telemetry SR. RAF worked well. Hemostasis achieved. No c/o abdominal or breast discomfort at present. Report called to Bethann Humble RN   07/09/22 1257   Treatment Summary   Time Off Machine 1300   Duration of Treatment (Hours) 3.5   Treatment Type 1:1   Dialyzer Clearance Lightly streaked   Fluid Volume Off (mL) 4000   Prime Volume (mL) 200   Rinseback Volume (mL) 200   Fluid Given: Normal Saline (mL) 0   Fluid Given: PRBC  0 mL   Fluid Given: Albumin (mL) 0   Fluid Given: Other (mL) 0   Total Fluid Given 400   Hemodialysis Net Fluid Removed 3600   Post Treatment Assessment   Post-Treatment Weight (Kg) 78.5   Patient Response to Treatment tolerated well   Graft/Fistula Right   No placement date or time found.   Access Type: Arteriovenous Fistula  Orientation: Right  Graft/Fistula Location: Upper Arm   Fistula/ Graft Assessment Abnormalities WDL   Needle Size 15 Gauge   Cannulation Sites held Arterial (min) 15   Cannulation Sites held Venous (min) 15   Hemostasis Achieved Yes   Vitals   Temp 98.4 F (36.9 C)   Heart Rate 80   Resp Rate 16   BP 141/88   SpO2 100 %   O2 Device None (Room air)   Assessment   Mental Status Lethargic;Oriented   Cardiac (WDL) WDL   Cardiac Regularity Return to [facility]   Cardiac Rhythm Normal sinus rhythm   Respiratory  WDL   Respiratory Pattern Return to WDL   Bilateral Breath Sounds Clear   R Breath Sounds Return to WDL   L Breath Sounds Return to [facility]   Cough Spontaneous;Congested   Edema  X   Facial Return to WDL   RLE Edema Non Pitting Edema   LLE Edema Non Pitting Edema   General Skin Color Appropriate for ethnicity   Skin Condition/Temp Return to WDL   Gastrointestinal (WDL) WDL   Abdomen Inspection Rounded   GI Conditions Return to [facility]   Mobility Ambulatory with Assistance   Pain Assessment   Charting  Type Reassessment   Pain Scale Used Numeric Scale (0-10)   Numeric Pain Scale   Pain Score 4   POSS Score 1   Pain Location Breast   Pain Orientation Right;Left   Pain Descriptors Aching   Pain Frequency Intermittent   Education   Person taught Patient   Knowledge basis Substantial   Topics taught Procedure;Medications   Teaching Tools Explain   Reponse Verbalizes Understanding   Bedside Nurse Communication   Name of bedside RN - post dialysis savanah taylor rn

## 2022-07-09 NOTE — Progress Notes (Signed)
Arrived via bed. Pt. Alert and oriented x3. Slightly agitated at times. Pt. Stated " I don't like to be touched a lot." C/o sob on exertion. Coughing moderated amount of clear sputum. Lungs clear. Sat 100% on RA. Face puffy. Pt with generalized non pitting edema. Telemetry SR. Good bruit and thrill in RAF. C/o pain in left breast. Due to ?Marland Kitchen C/o pain in left leg. Medicated with dilaudid prior to tx. Pt. Given protonix and phenergan for nause by floor RN. Pt. Is a visitor from [place], has missed dialysis x 4 tx. Consent for dialysis signed. Report received from Bethann Humble RN   07/09/22 0915   Dialysis Weight   Pre-Treatment Weight (Kg) 82.1   Scale Type ICU Bed Scale   Vitals   Temp 97.9 F (36.6 C)   Heart Rate 83   Resp Rate 16   BP (!) 157/95   O2 Device None (Room air)   Assessment   Mental Status Alert;Oriented;Agitated   Cardiac (WDL) WDL   Cardiac Regularity Return to [facility]   Cardiac Rhythm Normal sinus rhythm   Respiratory  X   Respiratory Pattern Dyspnea with exertion   Bilateral Breath Sounds Clear   R Breath Sounds Return to WDL   L Breath Sounds Return to [facility]   Cough Spontaneous;Congested   Edema  X   Generalized Edema Non Pitting Edema   Facial +1   RLE Edema Non Pitting Edema   LLE Edema Non Pitting Edema   General Skin Color Appropriate for ethnicity   Skin Condition/Temp Return to WDL   Gastrointestinal (WDL) WDL   Abdomen Inspection Rounded   GI Conditions Nausea   Mobility Ambulatory with Assistance   Pain Assessment   Charting Type Assessment   Pain Scale Used Numeric Scale (0-10)   Numeric Pain Scale   Pain Score 5   POSS Score 1   Pain Location Breast   Pain Orientation Right;Left   Pain Descriptors Aching   Pain Frequency Continuous   Hemodialysis Comments   Pre-Hemodialysis Comments time out done. consent signed

## 2022-07-09 NOTE — Plan of Care (Signed)
NURSING SHIFT NOTE     Patient: Katherine [name]  Day: 1      SHIFT EVENTS     Shift Narrative/Significant Events (PRN med administration, fall, RRT, etc.):     Patient arrived from ED- provider contacted to ensure past due medications were still to be admin and notify k level remained @ 6.2. Medications administered as ordered. Repeat K level WNL. HBG trending down during shift. Pt endorses bright red blood in stool and red emesis. Provider notified. Orders placed for trending h/h. Patient NPO effective immediately. Educated pt on bleeding precautions and monitoring. Req. Sample @ next BM/emesis episode. Personal care and comfort measures provided. Safety and fall precautions remain in place. Purposeful rounding completed. Admission docs completed. 4 eyes completed. PRN pain and nausea meds administered as ordered.          ASSESSMENT     Changes in assessment from patient's baseline this shift:    Neuro: No  CV: No  Pulm: No  Peripheral Vascular: No  HEENT: No  GI: Yes    BM during shift: Yes   , Last BM: Last BM Date: 07/08/22  GU: No   Integ: No  MS: No    Pain: Improved  Pain Interventions: Medications      Mobility: PMP Activity: Step 6 - Walks in Room of Distance Walked (ft) (Step 6,7): 30 Feet           Lines     Patient Lines/Drains/Airways Status       Active Lines, Drains and Airways       Name Placement date Placement time Site Days    Peripheral IV 07/08/22 18 G Standard Anterior;Left Upper Arm 07/08/22  1636  Upper Arm  less than 1                         VITAL SIGNS     Vitals:    07/09/22 0020   BP: (!) 147/92   Pulse: 97   Resp: 18   Temp: 98.1 F (36.7 C)   SpO2: 98%       Temp  Min: 98.1 F (36.7 C)  Max: 98.9 F (37.2 C)  Pulse  Min: 77  Max: 97  Resp  Min: 15  Max: 19  BP  Min: 144/91  Max: 184/104  SpO2  Min: 98 %  Max: 100 %    No intake or output data in the 24 hours ending 07/09/22 0401       CARE PLAN         4 eyes in 4 hours pressure injury assessment note:      Completed with:  [place]  Unit & Time admitted:   Unit 25 @ 2115           Bony Prominences: Check appropriate box; if wound is present enter wound assessment in LDA     Occiput:                 [x] WNL  []  Wound present  Face:                     [x] WNL  []  Wound present  Ears:                      [x] WNL  []  Wound present  Spine:                    [  x]WNL  []  Wound present  Shoulders:             [x] WNL  []  Wound present  Elbows:                  [x] WNL  []  Wound present  Sacrum/coccyx:     [x] WNL  []  Wound present  Ischial Tuberosity:  [x] WNL  []  Wound present  Trochanter/Hip:      [x] WNL  []  Wound present  Knees:                   [x] WNL  []  Wound present  Ankles:                   [x] WNL  []  Wound present  Heels:                    [x] WNL  []  Wound present            Problem: Pain interferes with ability to perform ADL  Goal: Pain at adequate level as identified by patient  Outcome: Progressing  Flowsheets (Taken 07/09/2022 0400)  Pain at adequate level as identified by patient:   Identify patient comfort function goal   Assess pain on admission, during daily assessment and/or before any "as needed" intervention(s)   Reassess pain within 30-60 minutes of any procedure/intervention, per Pain Assessment, Intervention, Reassessment (AIR) Cycle   Evaluate if patient comfort function goal is met   Offer non-pharmacological pain management interventions     Problem: Side Effects from Pain Analgesia  Goal: Patient will experience minimal side effects of analgesic therapy  Outcome: Progressing  Flowsheets (Taken 07/09/2022 0400)  Patient will experience minimal side effects of analgesic therapy:   Monitor/assess patient's respiratory status (RR depth, effort, breath sounds)   Prevent/manage side effects per LIP orders (i.e. nausea, vomiting, pruritus, constipation, urinary retention, etc.)   Evaluate for opioid-induced sedation with appropriate assessment tool (i.e. POSS)   Assess for changes in cognitive function     Problem: Compromised  Activity/Mobility  Goal: Activity/Mobility Interventions  Outcome: Progressing  Flowsheets (Taken 07/08/2022 2113)  Activity/Mobility Interventions: Pad bony prominences, TAP Seated positioning system when OOB, Promote PMP, Reposition q 2 hrs / turn clock, Offload heels     Problem: Safety  Goal: Patient will be free from injury during hospitalization  Outcome: Progressing  Flowsheets (Taken 07/09/2022 0400)  Patient will be free from injury during hospitalization:   Assess patient's risk for falls and implement fall prevention plan of care per policy   Provide and maintain safe environment   Hourly rounding     Problem: Pain  Goal: Pain at adequate level as identified by patient  Outcome: Progressing  Flowsheets (Taken 07/09/2022 0400)  Pain at adequate level as identified by patient:   Identify patient comfort function goal   Assess pain on admission, during daily assessment and/or before any "as needed" intervention(s)   Reassess pain within 30-60 minutes of any procedure/intervention, per Pain Assessment, Intervention, Reassessment (AIR) Cycle   Evaluate if patient comfort function goal is met   Offer non-pharmacological pain management interventions     Problem: Psychosocial and Spiritual Needs  Goal: Demonstrates ability to cope with hospitalization/illness  Outcome: Progressing  Flowsheets (Taken 07/09/2022 0400)  Demonstrates ability to cope with hospitalizations/illness:   Encourage verbalization of feelings/concerns/expectations   Provide quiet environment   Reinforce positive adaptation of new coping behaviors     Problem: Fluid and Electrolyte Imbalance/ Endocrine  Goal:  Fluid and electrolyte balance are achieved/maintained  Outcome: Progressing  Flowsheets (Taken 07/09/2022 0400)  Fluid and electrolyte balance are achieved/maintained:   Monitor/assess lab values and report abnormal values   Assess and reassess fluid and electrolyte status   Monitor for muscle weakness   Observe for cardiac arrhythmias  Goal:  Adequate hydration  Outcome: Progressing  Flowsheets (Taken 07/09/2022 0400)  Adequate hydration:   Assess mucus membranes, skin color, turgor, perfusion and presence of edema   Assess for peripheral, sacral, periorbital and abdominal edema   Monitor and assess vital signs and perfusion     Problem: Nutrition  Goal: Nutritional intake is adequate  Outcome: Progressing  Flowsheets (Taken 07/09/2022 0400)  Nutritional intake is adequate: Consult/collaborate with Clinical Nutritionist     Problem: Diabetes: Glucose Imbalance  Goal: Blood glucose stable at established goal  Outcome: Progressing  Flowsheets (Taken 07/09/2022 0400)  Blood glucose stable at established goal:   Monitor lab values   Monitor intake and output.  Notify LIP if urine output is < 30 mL/hour.   Assess for hypoglycemia /hyperglycemia   Monitor/assess vital signs   Ensure adequate hydration     Problem: Patient Receiving Advanced Renal Therapies  Goal: Therapy access site remains intact  Outcome: Progressing  Flowsheets (Taken 07/09/2022 0400)  Therapy access site remains intact:   Assess therapy access site   Change therapy access site dressing as needed

## 2022-07-09 NOTE — Progress Notes (Signed)
[facility]  PROGRESS NOTE      Patient: Katherine [name]  Date: 07/09/2022   LOS: 1 Days  Admission Date: 07/08/2022   MRN: [id number]  Attending: Melina Fiddler, MD  When on service as the attending, please contact me on Epic Secure Chat from 7 AM - 7 PM for non-urgent issues. For urgent matters use XTend page from 7 AM - 7 PM.       ASSESSMENT/PLAN     Katherine [name] is a 37 y.o. female admitted with Hyperkalemia        Hyperkalemia, POA  Elevated anion gap  Volume overload  ESRD on HD  History of lupus nephritis  Missed hemodialysis  Anemia of chronic kidney disease  CO2 22, K 6.2 on presentation  Baseline hemoglobin in the 8 range  Chest x-ray shows bilateral opacities consistent with pulmonary edema  NT proBNP is elevated  Status post hyperkalemia treatment  Nephrology on board hemodialysis today with 4 L fluid removed  Repeat chest x-ray and BMP in a.m.  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  Mild elevation of ALP  CT abdomen pelvis done a few days ago in [place] shows no acute findings in the abdomen and pelvis and significant decrease in size of left renal hematoma  U/A negative  As needed antiemetics  Advance diet as tolerated      Normocytic anemia most likely anemia of chronic kidney disease  H&H has been stable no signs of active bleeding   keep active type and  screen    Monitor hemoglobin transfuse to keep hemoglobin above 7    Nutrition: Advance diet as tolerated          Patient has BMI=Body mass index is 29.23 kg/m.  Diagnosis: Overweight based on BMI criteria      Recent Labs     07/09/22  0454 07/09/22  0008 07/08/22  1611   Platelets 117* 141* 156     Diagnosis: Mild Thrombocytopenia           Recent Labs     07/09/22  0454 07/09/22  0008 07/08/22  2046 07/08/22  1611   Sodium 136 136 134* 136     Diagnosis: Mild Hyponatremia      Recent Labs     07/09/22  0454 07/09/22  0008 07/08/22  2046 07/08/22  1611   Potassium 5.4* 4.9 6.2* 6.2*     Diagnosis: Hyperkalemia        Recent Labs   Lab 07/09/22  1012 07/09/22  0454 07/09/22  0008 07/08/22  1611   Hgb 7.4* 7.1* 7.5* 8.0*   Hematocrit 21.7* 22.0* 22.7* 24.6*   MCV  --  89.8 89.0 90.4   WBC  --  3.48 3.55 3.33   Platelets  --  117* 141* 156  Anemia Diagnosis: Already documented in note    Code Status: Full Code    Dispo: Hopefully in the morning if BP improved    Family Contact: none    DVT Prophylaxis:   Current Facility-Administered Medications (Includes Only Anticoagulants, Misc. Hematological)   Medication Dose Route Last Admin    apixaban (ELIQUIS) tablet 2.5 mg  2.5 mg Oral 2.5 mg at 07/09/22 1356          CHART  REVIEW & DISCUSSION     The following chart items were reviewed as of 6:12 PM on 07/09/22:  [x]  Lab Results [x]  Imaging Results   []  Problem List  [x]  Current Orders [x]  Current Medications  []  Allergies  [x]  Code Status [x]  Previous Notes   []  SDoH    The management and plan of care for this patient was discussed with the following specialty consultants:  []  Cardiology  []  Gastroenterology                 []  Infectious Disease  []  Pulmonology []  Neurology                [x]  Nephrology  []  Neurosurgery []  Orthopedic Surgery  []  Heme/Onc  []  General Surgery []  Psychiatry                                   []  Palliative    SUBJECTIVE     Katherine [name] reported that she still have flank  pain she also reports itchiness unable breasts are Symmetric, No breast masses, No nipple discharge.  No fever no chills she has some mild nausea this morning but was able to tolerate.  Clear liquid diet    MEDICATIONS     Current Facility-Administered Medications   Medication Dose Route Frequency    amLODIPine  10 mg Oral Daily    apixaban  2.5 mg Oral Q12H SCH    calcium acetate  1,334 mg Oral TID MEALS    hydrALAZINE  25 mg Oral TID    insulin regular (HumuLIN R) 5 Units intravenous injection  5 Units Intravenous Once    metoprolol succinate XL  25 mg Oral Daily    nystatin   Topical BID    pantoprazole  40 mg Oral QAM AC       PHYSICAL EXAM     Vitals:    07/09/22 1257   BP: 141/88   Pulse: 80   Resp: 16   Temp: 98.4 F (36.9 C)   SpO2: 100%       Temperature: Temp  Min: 97.9 F (36.6 C)  Max: 98.9 F (37.2 C)  Pulse: Pulse  Min: 78  Max: 97  Respiratory: Resp  Min: 15  Max: 18  Non-Invasive BP: BP  Min: 127/81  Max: 171/101  Pulse Oximetry SpO2  Min: 92 %  Max: 100 %    Intake and Output Summary (Last 24 hours) at Date Time    Intake/Output Summary (Last 24 hours) at 07/09/2022 1812  Last data filed at 07/09/2022 1257  Gross per 24 hour   Intake --   Output 3600 ml   Net -3600 ml         GEN APPEARANCE: Disheveled A&OX3, face puffy,  HEENT: PERLA; EOMI; Conjunctiva Clear  Left breast more swollen than the right  NECK: Supple; No bruits  CVS: RRR, S1, S2; No M/G/R  LUNGS: Bibasilar crackles  ABD: Soft; No TTP; + Normoactive BS  EXT: No edema; Pulses 2+ and intact  NEURO: CN 2-12 intact; No Focal neurological deficits      LABS     Recent Labs   Lab 07/09/22  1012 07/09/22  0454 07/09/22  0008 07/08/22  1611   WBC  --  3.48 3.55 3.33   RBC  --  2.45* 2.55* 2.72*   Hgb 7.4* 7.1* 7.5* 8.0*   Hematocrit 21.7* 22.0* 22.7* 24.6*   MCV  --  89.8 89.0 90.4   Platelets  --  117* 141* 156       Recent Labs   Lab 07/09/22  0454 07/09/22  0008 07/08/22  2046 07/08/22  1611   Sodium 136 136 134* 136   Potassium 5.4* 4.9  6.2* 6.2*   Chloride 94* 92* 94* 92*   CO2 25 26 20 22    BUN 75.0* 74.0* 72.0* 72.0*   Creatinine 14.5* 14.4* 13.9* 13.9*   Glucose 79 108* 63* 75   Calcium 6.9* 7.2* 7.3* 7.4*   Magnesium 1.9  --   --  2.0       Recent Labs   Lab 07/09/22  0454 07/08/22  1611   ALT 6 10   AST (SGOT) 13 18   Bilirubin, Total 0.4 0.4   Albumin 3.1* 3.6   Alkaline Phosphatase 121* 124*       Recent Labs   Lab 07/09/22  0008 07/08/22  1611   hs Troponin-I 14.7* 14.6*   hs Troponin-I Delta calc n/a  --              Microbiology Results (last 15 days)       ** No results found for the last 360 hours. **             RADIOLOGY     US Venous Dopp Upper Extrem Comp Bilat    Result Date: 07/08/2022   1. No evidence of acute DVT on either side. 2. Abnormalities on the right due to a fistula for dialysis. There is a patent stent in the cephalic vein. 3. The left basilic vein is not seen and may be chronically occluded. Wynema Birch, MD 07/08/2022 6:56 PM    XR Chest  AP Portable    Result Date: 07/08/2022  Subtle hazy opacities in bilateral lungs can be secondary to pulmonary edema. Superimposed infection not excluded. Nonda Lou, MD 07/08/2022 4:15 PM    XR Chest 2 Views    Result Date: 07/03/2022  IMPRESSION: Cardiomegaly, venous congestion.    CT Abdomen Pelvis WO IV/ WO PO Cont    Result Date: 07/02/2022  IMPRESSION: 1.  No acute findings in the abdomen or pelvis. 2.  Significant decrease in size of left renal hematomas. 3.  Diffuse increased density to the liver which can be seen with amiodarone therapy. 4.  Renal osteodystrophy.    CT Abdomen Pelvis Wo Contrast    Result Date: 06/25/2022  -Interval enlargement of the lower pole of the left kidney, likely representing hematoma given the clinical history. Superimposed infection cannot be excluded via imaging. Comparison with more recent prior imaging is recommended to evaluate whether or not this presumed hematoma is enlarging or resolving. - Additional chronic and incidental findings, as  above.    XR Chest AP Portable    Result Date: 06/25/2022  Findings concerning for mild pulmonary edema.   Echo Results       None  No results found for this or any previous visit.    Signed,  Melina Fiddler, MD  6:12 PM 07/09/2022

## 2022-07-10 ENCOUNTER — Inpatient Hospital Stay: Payer: No Typology Code available for payment source

## 2022-07-10 LAB — CBC AND DIFFERENTIAL
Absolute NRBC: 0 10*3/uL (ref 0.00–0.00)
Basophils Absolute Automated: 0.03 10*3/uL (ref 0.00–0.08)
Basophils Automated: 1.1 %
Eosinophils Absolute Automated: 0.29 10*3/uL (ref 0.00–0.44)
Eosinophils Automated: 10.3 %
Hematocrit: 23.6 % — ABNORMAL LOW (ref 34.7–43.7)
Hgb: 7.6 g/dL — ABNORMAL LOW (ref 11.4–14.8)
Immature Granulocytes Absolute: 0 10*3/uL (ref 0.00–0.07)
Immature Granulocytes: 0 %
Instrument Absolute Neutrophil Count: 1.48 10*3/uL (ref 1.10–6.33)
Lymphocytes Absolute Automated: 0.65 10*3/uL (ref 0.42–3.22)
Lymphocytes Automated: 23 %
MCH: 29.5 pg (ref 25.1–33.5)
MCHC: 32.2 g/dL (ref 31.5–35.8)
MCV: 91.5 fL (ref 78.0–96.0)
MPV: 10.1 fL (ref 8.9–12.5)
Monocytes Absolute Automated: 0.37 10*3/uL (ref 0.21–0.85)
Monocytes: 13.1 %
Neutrophils Absolute: 1.48 10*3/uL (ref 1.10–6.33)
Neutrophils: 52.5 %
Nucleated RBC: 0 /100 WBC (ref 0.0–0.0)
Platelets: 117 10*3/uL — ABNORMAL LOW (ref 142–346)
RBC: 2.58 10*6/uL — ABNORMAL LOW (ref 3.90–5.10)
RDW: 15 % (ref 11–15)
WBC: 2.82 10*3/uL — ABNORMAL LOW (ref 3.10–9.50)

## 2022-07-10 LAB — COMPREHENSIVE METABOLIC PANEL
ALT: 7 U/L (ref 0–55)
AST (SGOT): 13 U/L (ref 5–41)
Albumin/Globulin Ratio: 0.9 (ref 0.9–2.2)
Albumin: 3.1 g/dL — ABNORMAL LOW (ref 3.5–5.0)
Alkaline Phosphatase: 133 U/L — ABNORMAL HIGH (ref 37–117)
Anion Gap: 14 (ref 5.0–15.0)
BUN: 38 mg/dL — ABNORMAL HIGH (ref 7.0–21.0)
Bilirubin, Total: 0.3 mg/dL (ref 0.2–1.2)
CO2: 25 mEq/L (ref 17–29)
Calcium: 7.2 mg/dL — ABNORMAL LOW (ref 8.5–10.5)
Chloride: 98 mEq/L — ABNORMAL LOW (ref 99–111)
Creatinine: 8.3 mg/dL — ABNORMAL HIGH (ref 0.4–1.0)
Globulin: 3.3 g/dL (ref 2.0–3.6)
Glucose: 94 mg/dL (ref 70–100)
Potassium: 5.4 mEq/L — ABNORMAL HIGH (ref 3.5–5.3)
Protein, Total: 6.4 g/dL (ref 6.0–8.3)
Sodium: 137 mEq/L (ref 135–145)
eGFR: 6 mL/min/{1.73_m2} — AB (ref >=60)

## 2022-07-10 LAB — MAGNESIUM: Magnesium: 1.8 mg/dL (ref 1.6–2.6)

## 2022-07-10 LAB — HEMOGLOBIN AND HEMATOCRIT
Hematocrit: 23.5 % — ABNORMAL LOW (ref 34.7–43.7)
Hgb: 7.5 g/dL — ABNORMAL LOW (ref 11.4–14.8)

## 2022-07-10 LAB — PHOSPHORUS: Phosphorus: 3.9 mg/dL (ref 2.3–4.7)

## 2022-07-10 LAB — WHOLE BLOOD GLUCOSE POCT: Whole Blood Glucose POCT: 124 mg/dL — ABNORMAL HIGH (ref 70–100)

## 2022-07-10 MED ORDER — SODIUM POLYSTYRENE SULFONATE 15 GM/60ML PO SUSP
15.0000 g | Freq: Once | ORAL | Status: AC
Start: 2022-07-10 — End: 2022-07-10
  Administered 2022-07-10: 15 g via ORAL
  Filled 2022-07-10: qty 60

## 2022-07-10 NOTE — Progress Notes (Signed)
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   Cardiovascular:  Negative for chest pain, dyspnea on exertion and leg swelling.   Respiratory:  Negative for cough and shortness of breath.    Musculoskeletal:  Negative for back pain.   Gastrointestinal:  Negative for abdominal pain, nausea and vomiting.   Psychiatric/Behavioral:  Negative for altered mental status.         Physical Exam:     Vitals:    07/10/22 1153 07/10/22 1312 07/10/22 1609 07/10/22 1915   BP: 134/87 134/87 (!) 148/94 127/84   Pulse: 78  85 84   Resp: 18  18 20    Temp: 97.7 F (36.5 C)  98.1 F (36.7 C) 98.2 F (36.8 C)   TempSrc: Oral  Oral Oral   SpO2: 100%  99% 95%   Weight:       Height:         Intake and Output Summary (Last 24 hours) at Date Time    Intake/Output Summary (Last 24 hours) at 07/10/2022 1920  Last data filed at 07/09/2022 2100  Gross per 24 hour   Intake 100 ml   Output --   Net 100 ml       General: NAD, Alert and awake  Neck: No JVP  CVS: NSR, Normal S1 and S2, No murmurs  appreciated  Lungs: Clear to auscultate, Good air entry, no bilateral basal crackles  Neuro: AOx3  Extremities: no peripheral edema     Labs:     Lab Results   Component Value Date    WBC 2.82 (L) 07/10/2022    HGB 7.5 (L) 07/10/2022    HGB 7.6 (L) 07/10/2022    HCT 23.5 (L) 07/10/2022    HCT 23.6 (L) 07/10/2022    MCV 91.5 07/10/2022    PLT 117 (L) 07/10/2022      Recent Labs   Lab 07/10/22  0406   Sodium 137   Potassium 5.4*   Chloride 98*   CO2 25   BUN 38.0*   Creatinine 8.3*   eGFR 6.0*   Glucose 94   Calcium 7.2*        Rads:   XR Chest AP Portable  Narrative: HISTORY: SOB.      COMPARISON: 07/08/2022    FINDINGS:     Portable AP view of the chest shows stent overlying right upper mediastinum  and chest. There is mild pulmonary edema with right  basilar atelectasis.  Cardiomediastinal silhouette is not enlarged.  No focal bony lesion is  seen.  Impression: 1. Mild pulmonary edema with right basilar atelectasis    J. Carole Binning, MD  07/10/2022 6:21 PM       Welford Roche, MD  [phone] (T)  [phone] (F)  07/10/22

## 2022-07-10 NOTE — Plan of Care (Signed)
NURSING SHIFT NOTE     Patient: Katherine [name]  Day: 2      SHIFT EVENTS     Shift Narrative/Significant Events (PRN med administration, fall, RRT, etc.):     Pt is A&Ox4. No c/o HA, dizziness, SOB, or chest pain. Pt reported pain and nausea -- PRN med given. Trio rounding occurred. Medications given as ordered.    Pt updated on plan of care.    Safety and fall precautions remain in place. Purposeful rounding completed.          ASSESSMENT     Changes in assessment from patient's baseline this shift:    Neuro: No  CV: No  Pulm: No  Peripheral Vascular: No  HEENT: No  GI: No  BM during shift: No, Last BM: Last BM Date: 07/08/22  GU: No   Integ: No  MS: No    Pain: No change  Pain Interventions: Medications  Medications Utilized: Dilaudid    Mobility: PMP Activity: Step 3 - Bed Mobility of Distance Walked (ft) (Step 6,7): 30 Feet           Lines     Patient Lines/Drains/Airways Status       Active Lines, Drains and Airways       Name Placement date Placement time Site Days    Peripheral IV 07/08/22 18 G Standard Anterior;Left Upper Arm 07/08/22  1636  Upper Arm  1    Graft/Fistula Right --  --  -- --                         VITAL SIGNS     Vitals:    07/10/22 0838   BP: 146/88   Pulse: 79   Resp:    Temp:    SpO2:        Temp  Min: 97.9 F (36.6 C)  Max: 99 F (37.2 C)  Pulse  Min: 78  Max: 90  Resp  Min: 16  Max: 18  BP  Min: 117/76  Max: 171/101  SpO2  Min: 96 %  Max: 100 %      Intake/Output Summary (Last 24 hours) at 07/10/2022 0956  Last data filed at 07/09/2022 2100  Gross per 24 hour   Intake 100 ml   Output 3600 ml   Net -3500 ml            CARE PLAN       Problem: Pain interferes with ability to perform ADL  Goal: Pain at adequate level as identified by patient  Outcome: Progressing  Flowsheets (Taken 07/10/2022 0955)  Pain at adequate level as identified by patient:   Identify patient comfort function goal   Assess for risk of opioid induced respiratory depression, including snoring/sleep apnea.  Alert healthcare team of risk factors identified.   Assess pain on admission, during daily assessment and/or before any "as needed" intervention(s)   Reassess pain within 30-60 minutes of any procedure/intervention, per Pain Assessment, Intervention, Reassessment (AIR) Cycle   Evaluate if patient comfort function goal is met   Evaluate patient's satisfaction with pain management progress   Offer non-pharmacological pain management interventions   Include patient/patient care companion in decisions related to pain management as needed     Problem: Pain  Goal: Pain at adequate level as identified by patient  Outcome: Progressing  Flowsheets (Taken 07/10/2022 0955)  Pain at adequate level as identified by patient:   Identify patient comfort function goal  Assess for risk of opioid induced respiratory depression, including snoring/sleep apnea. Alert healthcare team of risk factors identified.   Assess pain on admission, during daily assessment and/or before any "as needed" intervention(s)   Reassess pain within 30-60 minutes of any procedure/intervention, per Pain Assessment, Intervention, Reassessment (AIR) Cycle   Evaluate if patient comfort function goal is met   Evaluate patient's satisfaction with pain management progress   Offer non-pharmacological pain management interventions   Include patient/patient care companion in decisions related to pain management as needed     Problem: Fluid and Electrolyte Imbalance/ Endocrine  Goal: Fluid and electrolyte balance are achieved/maintained  Outcome: Progressing  Flowsheets (Taken 07/10/2022 0955)  Fluid and electrolyte balance are achieved/maintained:   Monitor/assess lab values and report abnormal values   Assess and reassess fluid and electrolyte status   Observe for cardiac arrhythmias   Monitor for muscle weakness

## 2022-07-10 NOTE — Plan of Care (Signed)
NURSING SHIFT NOTE     Patient: Katherine [name]  Day: 3      SHIFT EVENTS     Shift Narrative/Significant Events (PRN med administration, fall, RRT, etc.):   Pt Aox4 on room and nsr on tele. Her vss and she reports no pain or discomfort. She received scheduled hydralizineand elequis, She is independent.  No pain reported this evening. Pt refused blood draw this morning.She was educated on the importance of getting her labs however she still refused.    Safety and fall precautions remain in place. Purposeful rounding completed.          ASSESSMENT     Changes in assessment from patient's baseline this shift:    Neuro: No  CV: No  Pulm: No  Peripheral Vascular: No  HEENT: no  GI: No  BM during shift: No Last BM: Last BM Date: 07/08/22  GU: No  Integ: no  MS: No  Pain:No pain  Pain Interventions: No pain meds   Medications Utilized: NO    Mobility: PMP Activity: Step 6 - Walks in Room of Distance Walked (ft) (Step 6,7): 30 Feet           Lines     Patient Lines/Drains/Airways Status       Active Lines, Drains and Airways       Name Placement date Placement time Site Days    Peripheral IV 07/08/22 18 G Standard Anterior;Left Upper Arm 07/08/22  1636  Upper Arm  2    Graft/Fistula Right --  --  -- --                         VITAL SIGNS     Vitals:    07/11/22 0006   BP: (!) 163/97   Pulse: 89   Resp: 19   Temp: 98.2 F (36.8 C)   SpO2: 98%       Temp  Min: 97.7 F (36.5 C)  Max: 99 F (37.2 C)  Pulse  Min: 78  Max: 89  Resp  Min: 18  Max: 20  BP  Min: 127/84  Max: 163/97  SpO2  Min: 95 %  Max: 100 %    No intake or output data in the 24 hours ending 07/11/22 0007         CARE PLAN       Problem: Pain interferes with ability to perform ADL  Goal: Pain at adequate level as identified by patient  Outcome: Progressing  Flowsheets (Taken 07/10/2022 0955 by Bland Span, RN)  Pain at adequate level as identified by patient:   Identify patient comfort function goal   Assess for risk of opioid induced respiratory  depression, including snoring/sleep apnea. Alert healthcare team of risk factors identified.   Assess pain on admission, during daily assessment and/or before any "as needed" intervention(s)   Reassess pain within 30-60 minutes of any procedure/intervention, per Pain Assessment, Intervention, Reassessment (AIR) Cycle   Evaluate if patient comfort function goal is met   Evaluate patient's satisfaction with pain management progress   Offer non-pharmacological pain management interventions   Include patient/patient care companion in decisions related to pain management as needed     Problem: Side Effects from Pain Analgesia  Goal: Patient will experience minimal side effects of analgesic therapy  Outcome: Progressing  Flowsheets (Taken 07/10/2022 0845 by Bland Span, RN)  Patient will experience minimal side effects of analgesic therapy: Prevent/manage side effects  per LIP orders (i.e. nausea, vomiting, pruritus, constipation, urinary retention, etc.)     Problem: Compromised Activity/Mobility  Goal: Activity/Mobility Interventions  Outcome: Progressing  Flowsheets (Taken 07/08/2022 2113 by Hassinger, Leonard Schwartz, RN)  Activity/Mobility Interventions: Pad bony prominences, TAP Seated positioning system when OOB, Promote PMP, Reposition q 2 hrs / turn clock, Offload heels     Problem: Safety  Goal: Patient will be free from injury during hospitalization  Outcome: Progressing  Flowsheets (Taken 07/09/2022 0400 by Hassinger, Leonard Schwartz, RN)  Patient will be free from injury during hospitalization:   Assess patient's risk for falls and implement fall prevention plan of care per policy   Provide and maintain safe environment   Hourly rounding     Problem: Pain  Goal: Pain at adequate level as identified by patient  Outcome: Progressing  Flowsheets (Taken 07/10/2022 0955 by Bland Span, RN)  Pain at adequate level as identified by patient:   Identify patient comfort function goal   Assess for risk of opioid  induced respiratory depression, including snoring/sleep apnea. Alert healthcare team of risk factors identified.   Assess pain on admission, during daily assessment and/or before any "as needed" intervention(s)   Reassess pain within 30-60 minutes of any procedure/intervention, per Pain Assessment, Intervention, Reassessment (AIR) Cycle   Evaluate if patient comfort function goal is met   Evaluate patient's satisfaction with pain management progress   Offer non-pharmacological pain management interventions   Include patient/patient care companion in decisions related to pain management as needed     Problem: Psychosocial and Spiritual Needs  Goal: Demonstrates ability to cope with hospitalization/illness  Outcome: Progressing  Flowsheets (Taken 07/09/2022 0400 by Hassinger, Leonard Schwartz, RN)  Demonstrates ability to cope with hospitalizations/illness:   Encourage verbalization of feelings/concerns/expectations   Provide quiet environment   Reinforce positive adaptation of new coping behaviors     Problem: Fluid and Electrolyte Imbalance/ Endocrine  Goal: Fluid and electrolyte balance are achieved/maintained  Outcome: Progressing  Flowsheets (Taken 07/10/2022 0955 by Bland Span, RN)  Fluid and electrolyte balance are achieved/maintained:   Monitor/assess lab values and report abnormal values   Assess and reassess fluid and electrolyte status   Observe for cardiac arrhythmias   Monitor for muscle weakness  Goal: Adequate hydration  Outcome: Progressing  Flowsheets (Taken 07/09/2022 0400 by Hassinger, Leonard Schwartz, RN)  Adequate hydration:   Assess mucus membranes, skin color, turgor, perfusion and presence of edema   Assess for peripheral, sacral, periorbital and abdominal edema   Monitor and assess vital signs and perfusion     Problem: Nutrition  Goal: Nutritional intake is adequate  Outcome: Progressing  Flowsheets (Taken 07/09/2022 0400 by Hassinger, Leonard Schwartz, RN)  Nutritional intake is adequate:  Consult/collaborate with Clinical Nutritionist     Problem: Patient Receiving Advanced Renal Therapies  Goal: Therapy access site remains intact  Outcome: Progressing

## 2022-07-10 NOTE — Plan of Care (Addendum)
Problem: Pain interferes with ability to perform ADL  Goal: Pain at adequate level as identified by patient  Outcome: Progressing     Problem: Compromised Activity/Mobility  Goal: Activity/Mobility Interventions  Outcome: Progressing     Problem: Safety  Goal: Patient will be free from injury during hospitalization  Outcome: Progressing     Problem: Pain  Goal: Pain at adequate level as identified by patient  Outcome: Progressing     Problem: Patient Receiving Advanced Renal Therapies  Goal: Therapy access site remains intact  Outcome: Progressing     Problem: Renal Instability  Goal: Fluid and electrolyte balance are achieved/maintained  Outcome: Progressing    Pt is ambulatory around the nurse's station. Bed in a lower locked position. No resp. Distress noted. Pt is sleeping at this time. Will continue to monitor pt for changes.    Minerva Areola, PA made aware of K level. Will continue to monitor pt for changes.

## 2022-07-10 NOTE — Progress Notes (Signed)
[facility]  PROGRESS NOTE      Patient: Katherine [name]  Date: 07/10/2022   LOS: 2 Days  Admission Date: 07/08/2022   MRN: [id number]  Attending: Melina Fiddler, MD  When on service as the attending, please contact me on Epic Secure Chat from 7 AM - 7 PM for non-urgent issues. For urgent matters use XTend page from 7 AM - 7 PM.       ASSESSMENT/PLAN     Katherine [name] is a 37 y.o. female admitted with Hyperkalemia        Hyperkalemia, POA  Elevated anion gap  Volume overload  ESRD on HD  History of lupus nephritis  Missed hemodialysis  Anemia of chronic kidney disease  CO2 22, K 6.2 on presentation  Baseline hemoglobin in the 8 range  Chest x-ray shows bilateral opacities consistent with pulmonary edema  NT proBNP is elevated  Status post hyperkalemia treatment  Nephrology on board hemodialysis today with 4 L fluid removed  Repeat chest x-ray today  Potassium today 5.4 will give 1 dose of Kayexalate  As needed supplemental oxygen for goal O2 sats greater than 92%  Unclear if patient has a set up chair for dialysis at [facility] case management is informed and will follow-up tomorrow  Hypertensive urgency  Systolic blood pressure 180s on arrival likely due to volume overload  Continue home amlodipine 10, toprol XL 25, and hydralazine 25 TID     History of gastritis  Continue home PPI     Left flank pain  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  Mild elevation of ALP  CT abdomen pelvis done a few days ago in [place] shows no acute findings in the abdomen and pelvis and significant decrease in size of left renal hematoma  U/A negative  As needed antiemetics  Advance diet as  tolerated      Normocytic anemia most likely anemia of chronic kidney disease  H&H has been stable no signs of active bleeding   keep active type and screen    Monitor hemoglobin transfuse to keep hemoglobin above 7    Nutrition: Advance diet as tolerated          Patient has BMI=Body mass index is 27.7 kg/m.  Diagnosis: Overweight based on BMI criteria          Recent Labs     07/10/22  0406 07/09/22  0454 07/09/22  0008 07/08/22  1611   Platelets 117* 117* 141* 156     Diagnosis: Mild Thrombocytopenia          Recent Labs     07/10/22  0406 07/09/22  0454 07/09/22  0008 07/08/22  2046 07/08/22  1611   Sodium 137 136 136 134* 136     Diagnosis: Mild Hyponatremia          Recent Labs     07/10/22  0406 07/09/22  0454 07/09/22  0008 07/08/22  2046 07/08/22  1611   Potassium 5.4* 5.4* 4.9 6.2* 6.2*     Diagnosis: Hyperkalemia          Recent Labs   Lab 07/10/22  0406 07/09/22  1012 07/09/22  0454 07/09/22  0008   Hgb 7.5*  7.6* 7.4* 7.1* 7.5*   Hematocrit 23.5*  23.6* 21.7* 22.0* 22.7*   MCV 91.5  --  89.8 89.0   WBC 2.82*  --  3.48 3.55   Platelets 117*  --  117* 141*         Anemia Diagnosis: Already documented in note    Code Status: Full Code    Dispo: Hopefully tomorrow if we confirm she has a chair set up for HD    Family Contact: none    DVT Prophylaxis:   Current Facility-Administered Medications (Includes Only Anticoagulants, Misc. Hematological)   Medication Dose Route Last Admin    apixaban (ELIQUIS) tablet 2.5 mg  2.5 mg Oral 2.5 mg at 07/10/22 5409          CHART  REVIEW & DISCUSSION     The following chart items were reviewed as of 4:26 PM on 07/10/22:  [x]  Lab Results [x]  Imaging Results   []  Problem List  [x]  Current Orders [x]  Current Medications  []  Allergies  [x]  Code Status [x]  Previous Notes   []  SDoH    The management and plan of care for this patient was discussed with the following specialty consultants:  []  Cardiology  []  Gastroenterology                 []  Infectious Disease  []   Pulmonology []  Neurology                [x]  Nephrology  []  Neurosurgery []  Orthopedic Surgery  []  Heme/Onc  []  General Surgery []  Psychiatry                                   []  Palliative    SUBJECTIVE     [name] reports feeling better denies any headache dizziness chest pain shortness of breath she is able to tolerate diet without any issues  MEDICATIONS     Current Facility-Administered Medications   Medication Dose Route Frequency    amLODIPine  10 mg Oral Daily    apixaban  2.5 mg Oral Q12H SCH    calcium acetate  1,334 mg Oral TID MEALS    hydrALAZINE  25 mg Oral TID    insulin regular (HumuLIN R) 5 Units intravenous injection  5 Units Intravenous Once    metoprolol succinate XL  25 mg Oral Daily    nystatin   Topical BID    pantoprazole  40 mg Oral QAM AC       PHYSICAL EXAM     Vitals:    07/10/22 1609   BP: (!) 148/94   Pulse: 85   Resp: 18   Temp: 98.1 F (36.7 C)   SpO2: 99%       Temperature: Temp  Min: 97.7 F (36.5 C)  Max: 99 F (37.2 C)  Pulse: Pulse  Min: 78  Max: 90  Respiratory: Resp  Min: 18  Max: 18  Non-Invasive BP: BP  Min: 117/76  Max: 148/94  Pulse Oximetry SpO2  Min: 96 %  Max: 100 %    Intake and Output Summary (Last 24 hours) at Date Time    Intake/Output Summary (Last 24 hours) at 07/10/2022 1626  Last data filed at 07/09/2022 2100  Gross per 24 hour   Intake 100 ml   Output --   Net 100 ml  GEN APPEARANCE: Disheveled A&OX3, face puffy,  HEENT: PERLA; EOMI; Conjunctiva Clear  Left breast more swollen than the right  NECK: Supple; No bruits  CVS: RRR, S1, S2; No M/G/R  LUNGS: Bibasilar crackles  ABD: Soft; No TTP; + Normoactive BS  EXT: No edema; Pulses 2+ and intact  NEURO: CN 2-12 intact; No Focal neurological deficits      LABS     Recent Labs   Lab 07/10/22  0406 07/09/22  1012 07/09/22  0454 07/09/22  0008   WBC 2.82*  --  3.48 3.55   RBC 2.58*  --  2.45* 2.55*   Hgb 7.5*  7.6* 7.4* 7.1* 7.5*   Hematocrit 23.5*  23.6* 21.7* 22.0* 22.7*   MCV 91.5  --  89.8  89.0   Platelets 117*  --  117* 141*       Recent Labs   Lab 07/10/22  0406 07/09/22  0454 07/09/22  0008 07/08/22  2046 07/08/22  1611   Sodium 137 136 136 134* 136   Potassium 5.4* 5.4* 4.9 6.2* 6.2*   Chloride 98* 94* 92* 94* 92*   CO2 25 25 26 20 22    BUN 38.0* 75.0* 74.0* 72.0* 72.0*   Creatinine 8.3* 14.5* 14.4* 13.9* 13.9*   Glucose 94 79 108* 63* 75   Calcium 7.2* 6.9* 7.2* 7.3* 7.4*   Magnesium 1.8 1.9  --   --  2.0       Recent Labs   Lab 07/10/22  0406 07/09/22  0454 07/08/22  1611   ALT 7 6 10    AST (SGOT) 13 13 18    Bilirubin, Total 0.3 0.4 0.4   Albumin 3.1* 3.1* 3.6   Alkaline Phosphatase 133* 121* 124*       Recent Labs   Lab 07/09/22  0008 07/08/22  1611   hs Troponin-I 14.7* 14.6*   hs Troponin-I Delta calc n/a  --              Microbiology Results (last 15 days)       ** No results found for the last 360 hours. **             RADIOLOGY     US Venous Dopp Upper Extrem Comp Bilat    Result Date: 07/08/2022   1. No evidence of acute DVT on either side. 2. Abnormalities on the right due to a fistula for dialysis. There is a patent stent in the cephalic vein. 3. The left basilic vein is not seen and may be chronically occluded. Wynema Birch, MD 07/08/2022 6:56 PM    XR Chest  AP Portable    Result Date: 07/08/2022  Subtle hazy opacities in bilateral lungs can be secondary to pulmonary edema. Superimposed infection not excluded. Nonda Lou, MD 07/08/2022 4:15 PM    XR Chest 2 Views    Result Date: 07/03/2022  IMPRESSION: Cardiomegaly, venous congestion.    CT Abdomen Pelvis WO IV/ WO PO Cont    Result Date: 07/02/2022  IMPRESSION: 1.  No acute findings in the abdomen or pelvis. 2.  Significant decrease in size of left renal hematomas. 3.  Diffuse increased density to the liver which can be seen with amiodarone therapy. 4.  Renal osteodystrophy.    CT Abdomen Pelvis Wo Contrast    Result Date: 06/25/2022  -Interval enlargement of the lower pole of the left kidney, likely representing hematoma given the  clinical history. Superimposed infection cannot be excluded via imaging. Comparison with more  recent prior imaging is recommended to evaluate whether or not this presumed hematoma is enlarging or resolving. - Additional chronic and incidental findings, as above.    XR Chest AP Portable    Result Date: 06/25/2022  Findings concerning for mild pulmonary edema.   Echo Results       None          No results found for this or any previous visit.    Signed,  Melina Fiddler, MD  4:26 PM 07/10/2022

## 2022-07-11 MED ORDER — HYDRALAZINE HCL 25 MG PO TABS
25.0000 mg | ORAL_TABLET | Freq: Three times a day (TID) | ORAL | 0 refills | Status: AC
Start: 2022-07-11 — End: 2023-07-11

## 2022-07-11 MED ORDER — APIXABAN 2.5 MG PO TABS
2.5000 mg | ORAL_TABLET | Freq: Two times a day (BID) | ORAL | 0 refills | Status: AC
Start: 2022-07-11 — End: 2023-07-11

## 2022-07-11 MED ORDER — AMLODIPINE BESYLATE 10 MG PO TABS
10.0000 mg | ORAL_TABLET | Freq: Every day | ORAL | 0 refills | Status: AC
Start: 2022-07-11 — End: ?

## 2022-07-11 MED ORDER — METOPROLOL SUCCINATE ER 25 MG PO TB24
25.0000 mg | ORAL_TABLET | Freq: Every day | ORAL | 0 refills | Status: AC
Start: 2022-07-12 — End: ?

## 2022-07-11 NOTE — Progress Notes (Signed)
DCP was reviewed and agreeable with discharge plan and orders. Patient declined transportation assistance to pharmacy to retrieve medications. Patient stated she already has the necessary medication.     Lyft transportation was scheduled for patient to arrive at the local [employer].        07/11/22 1601   Discharge Disposition   Patient preference/choice provided? Yes   Physical Discharge Disposition Home   Mode of Transportation Taxi Voucher   Patient/Family/POA notified of transfer plan Patient informed only   Patient agreeable to discharge plan/expected d/c date? Yes   Bedside nurse notified of transport plan? Yes   CM Interventions   Follow up appointment scheduled? No   Reason no follow up scheduled?   (Patient to schedule follow up.)   Notified MD? Yes   Multidisciplinary rounds/family meeting before d/c? Yes   Medicare Checklist   Is this a Medicare patient? No     [place] Sameka Bagent, MSW  Social Work Futures trader I  [facility]  [phone]

## 2022-07-11 NOTE — Discharge Instr - AVS First Page (Addendum)
Reason for your Hospital Admission:  Fluid overload  Hyperkalemia  Missed HD      Instructions for after your discharge:  [facility] DISCHARGE INSTRUCTIONS     Date of Admission: 07/08/2022    Date of Discharge: 07/11/2022    Discharge Physician: Melina Fiddler, MD    Thank you for choosing the [facility]  for your healthcare needs. It was a privilege caring for you. I am genuinely concerned about your health and comfort.      Below, please find detailed instructions regarding your medical care.     You were admitted for Hyperkalemia. It is important that you make your follow up appointments listed below.  Bring these discharge papers to your follow-up visit with your medical provider.   I cannot stress the importance of follow up enough.      Consultants: Nephrology    Pending Studies: None    Discharge Diet: Renal diet    Surgery/Procedure: Hemodialysis    Further Instructions: Please follow-up with your PCP, hemodialysis after discharge    Follow up Appointments:   Follow-up Information       Pcp, None, MD .               Monte Fantasia, Leontine Locket, MD .    Specialty: Internal Medicine  Contact information:  [address]  [place] [address]  [phone]               your PCP Follow up in 1 week(s).                                 Please take your medications as prescribed. As we discussed, please review the attached handouts for more information about important side effects of your medication.    If you have any fever, chest pain, palpitations, shortness of breath or difficulty breathing, worsening nausea and vomiting, or lower leg pain please seek medical attention immediately.     Finally, below please find the results of imaging studies that you had in the hospital. Please review this with your primary care doctor.    You will likely be receiving a survey about the care you received in our hospital. It would mean a lot to me and all of the care team if you could fill that out and return the form.  We're always looking to improve and your feedback will help [place] do that.     I wish you good health. Please do not hesitate to contact me if I can be of assistance. I aim to provide excellent care.     Respectfully Yours,    Melina Fiddler, MD        If you are unable to obtain an appointment, unable to obtain newly prescribed medications, or are unclear about any of your discharge instructions please contact your discharging physician at [phone] (M-F, 8am-3pm) or weekends and after hours via the hospital operator [phone], hospital case manager, or your primary care physician.    Imaging Studies while hospitalized:  XR Chest AP Portable    Result Date: 07/10/2022  1. Mild pulmonary edema with right basilar atelectasis J. Carole Binning, MD 07/10/2022 6:21 PM    US Venous Dopp Upper Extrem Comp Bilat    Result Date: 07/08/2022   1. No evidence of acute DVT on either side. 2. Abnormalities on the right due to a fistula  for dialysis. There is a patent stent in the cephalic vein. 3. The left basilic vein is not seen and may be chronically occluded. Wynema Birch, MD 07/08/2022 6:56 PM    XR Chest  AP Portable    Result Date: 07/08/2022  Subtle hazy opacities in bilateral lungs can be secondary to pulmonary edema. Superimposed infection not excluded. Nonda Lou, MD 07/08/2022 4:15 PM    XR Chest 2 Views    Result Date: 07/03/2022  IMPRESSION: Cardiomegaly, venous congestion.    CT Abdomen Pelvis WO IV/ WO PO Cont    Result Date: 07/02/2022  IMPRESSION: 1.  No acute findings in the abdomen or pelvis. 2.  Significant decrease in size of left renal hematomas. 3.  Diffuse increased density to the liver which can be seen with amiodarone therapy. 4.  Renal osteodystrophy.    CT Abdomen Pelvis Wo Contrast    Result Date: 06/25/2022  -Interval enlargement of the lower pole of the left kidney, likely representing hematoma given the clinical history. Superimposed infection cannot be excluded via imaging. Comparison with  more recent prior imaging is recommended to evaluate whether or not this presumed hematoma is enlarging or resolving. - Additional chronic and incidental findings, as above.    XR Chest AP Portable    Result Date: 06/25/2022  Findings concerning for mild pulmonary edema.   Echo Results       None          No results found for this or any previous visit.    Please be sure to review these imaging studies with your primary medical provider.

## 2022-07-11 NOTE — Plan of Care (Signed)
NURSING SHIFT NOTE     Patient: Katherine [name]  Day: 3      SHIFT EVENTS     Shift Narrative/Significant Events (PRN med administration, fall, RRT, etc.):     Pt is A&Ox4. No c/o HA, dizziness, SOB, chest pain, or N/V. Pt refused labs. MD notified. Trio rounding occurred. Pt ambulated in the hallway independently and without distress. Tele removed per order.    13:07 PRN med given for nausea.   Medications given as ordered.    Pt updated on plan of care and planned for discharge.  15:45 PIV removed. Discharge education attempted, but pt dismissed it, stating she already knows. Primary RN reiterated the medications to be picked up. Pt stated, "I already have them". All belongings taken by pt and transport called via wheelchair to take pt down.     Safety and fall precautions remain in place. Purposeful rounding completed.          ASSESSMENT     Changes in assessment from patient's baseline this shift:    Neuro: No  CV: No  Pulm: No  Peripheral Vascular: No  HEENT: No  GI: No  BM during shift: No, Last BM: Last BM Date: 07/08/22  GU: No   Integ: No  MS: No    Pain: None  Pain Interventions: None  Medications Utilized: None    Mobility: PMP Activity: Step 3 - Bed Mobility of Distance Walked (ft) (Step 6,7): 30 Feet           Lines     Patient Lines/Drains/Airways Status       Active Lines, Drains and Airways       Name Placement date Placement time Site Days    Peripheral IV 07/08/22 18 G Standard Anterior;Left Upper Arm 07/08/22  1636  Upper Arm  2    Graft/Fistula Right --  --  -- --                         VITAL SIGNS     Vitals:    07/11/22 0803   BP: 142/83   Pulse: 89   Resp: 18   Temp: 97.9 F (36.6 C)   SpO2: 98%       Temp  Min: 97.7 F (36.5 C)  Max: 98.2 F (36.8 C)  Pulse  Min: 78  Max: 89  Resp  Min: 18  Max: 20  BP  Min: 118/70  Max: 163/97  SpO2  Min: 95 %  Max: 100 %    No intake or output data in the 24 hours ending 07/11/22 0818       CARE PLAN       Problem: Pain interferes with ability  to perform ADL  Goal: Pain at adequate level as identified by patient  Outcome: Progressing  Flowsheets (Taken 07/11/2022 0816)  Pain at adequate level as identified by patient:   Identify patient comfort function goal   Assess for risk of opioid induced respiratory depression, including snoring/sleep apnea. Alert healthcare team of risk factors identified.   Assess pain on admission, during daily assessment and/or before any "as needed" intervention(s)   Evaluate if patient comfort function goal is met   Reassess pain within 30-60 minutes of any procedure/intervention, per Pain Assessment, Intervention, Reassessment (AIR) Cycle   Offer non-pharmacological pain management interventions   Include patient/patient care companion in decisions related to pain management as needed     Problem: Pain  Goal: Pain at adequate level as identified by patient  Outcome: Progressing  Flowsheets (Taken 07/11/2022 0816)  Pain at adequate level as identified by patient:   Identify patient comfort function goal   Assess for risk of opioid induced respiratory depression, including snoring/sleep apnea. Alert healthcare team of risk factors identified.   Assess pain on admission, during daily assessment and/or before any "as needed" intervention(s)   Evaluate if patient comfort function goal is met   Reassess pain within 30-60 minutes of any procedure/intervention, per Pain Assessment, Intervention, Reassessment (AIR) Cycle   Offer non-pharmacological pain management interventions   Include patient/patient care companion in decisions related to pain management as needed     Problem: Renal Instability  Goal: Fluid and electrolyte balance are achieved/maintained  Outcome: Progressing  Flowsheets (Taken 07/11/2022 0816)  Fluid and electrolyte balance are achieved/maintained:   Monitor/assess lab values and report abnormal values   Assess and reassess fluid and electrolyte status   Observe for cardiac arrhythmias

## 2022-07-11 NOTE — Progress Notes (Addendum)
Initial Case Management Assessment and Discharge Planning [facility]   Patient Name: Katherine [name], Katherine [name]   Date of Birth 08/24/1985   Attending Physician: Monte Fantasia, Leontine Locket, MD   Primary Care Physician: Pcp, None, MD   Length of Stay 3   Reason for Consult / Chief Complaint Initial IDPA        Situation   Admission DX:   1. Hyperkalemia        A/O Status: X 3    LACE Score: 6    Patient admitted from: ER  Admission Status: inpatient    Health Care Agent: Self     Background     Advanced directive:   <no information>      Code Status:   Full Code     Residence: Multi-story home    PCP: PCP None, MD  Patient Contact:   [phone] (home)     [phone] (mobile)     Emergency contact:   Extended Emergency Contact Information  Primary Emergency Contact: Carmen,Catherine  Mobile Phone: [phone]  Relation: Mother      ADL/IADL's: Independent  Previous Level of function: 6 Modified Independent     DME: None    Pharmacy:     [id number], [place] - [address] [place] [address]  Phone: [phone] Fax: [phone]      Prescription Coverage: Yes    Home Health: The patient is not currently receiving home health services.    Previous SNF/AR:   Date First IMM given:   UAI on file?: No  Transport for discharge? Mode of transportation: Taxi  Agreeable to Home with family post-discharge:  Yes     Assessment   CM met with patient at bedside; verified facesheet.   Patient denied having issues with obtaining medication or medical visits.   Patient resides in [place], [place]. Patient indicated she was visiting since 07/08/2022 and intends to return after being discharged from [facility].   Patient stated she was previously set up with outpatient HD in [facility] but reports she can no longer return for OP HD. Patient indicated she receives OP HD at [facility] - [facility]. Patient shared that having an OP HD chair outside of the county of residence is not feasible due to lack  of transportation.  Patient reports to be independent with no DME use for ambulation.  Patient denies having a history of SNF/HH.  Patient denied safety concerns and has sufficient support should they need it.   Patient has no financial needs at this time.    Lyft transportation assist to [employer].    CM Interventions:  Notified [facility] HD Coordinator of patient's admission.   Spoke with [facility] ED via phone [phone]). The facility prefers patient's to have an OP HD chair set up but receiving HD care at the hospital is  permissible. Patient usually goes to [facility] locations at [employer] or [employer].  BARRIERS TO DISCHARGE:      Recommendation   D/C Plan A: Home with family; OP HD    D/C Plan B:     D/C Plan C:        [place] Margarita Bobrowski, MSW  Social Work Futures trader I  [facility]  [phone]

## 2022-07-11 NOTE — Progress Notes (Signed)
Referral from hosp CC, [place] -pt visitng [place] from [place], [place].  Hosp CM requesting this coordinator to assist with OP HD arrangements at clinic in [facility].  Writer spoke with pt by phone, introduced self, explained role and offered to assist.  Pt pushed back against any asssitance from this Clinical research associate. Pt explains that she has been denied admission from [facility] and [facility] in [facility] and she does not want to go outside of [facility],  HD Care Coordinator  [phone]

## 2022-07-11 NOTE — Progress Notes (Incomplete)
RENAL PROGRESS NOTE    Patient Name: Katherine [name]  Attending Physician: Monte Fantasia, Leontine Locket, MD    Assessment:   ***    Plan:   ***    Subjective:   ***    Review of Systems:   Review of Systems - {ros master:[id number]}    Meds:     Current Facility-Administered Medications   Medication Dose Route Frequency    amLODIPine  10 mg Oral Daily    apixaban  2.5 mg Oral Q12H SCH    calcium acetate  1,334 mg Oral TID MEALS    hydrALAZINE  25 mg Oral TID    insulin regular (HumuLIN R) 5 Units intravenous injection  5 Units Intravenous Once    metoprolol succinate XL  25 mg Oral Daily    nystatin   Topical BID    pantoprazole  40 mg Oral QAM AC       Physical Exam:   BP 101/66   Pulse 89   Temp 98.4 F (36.9 C) (Oral)   Resp 16   Ht 1.676 m (5' 5.98")   Wt 77.8 kg (171 lb 8.3 oz)   SpO2 99%   BMI 27.70 kg/m   No intake or output data in the 24 hours ending 07/11/22 1256    General: awake, alert, oriented x 3; no acute distress.  HEENT pallor,   Mucous membranes moist,   Neck - JVP not raised  Chest -Bilateral air entry   Heart - S1, S2,   Abdomen - soft, nontender, nondistended,   Extremities: no edema    Labs:     Recent Labs     07/10/22  0406 07/09/22  1012 07/09/22  0454   WBC 2.82*  --  3.48   Hgb 7.5*  7.6* 7.4* 7.1*   Hematocrit 23.5*  23.6* 21.7* 22.0*   Platelets 117*  --  117*   MCV 91.5  --  89.8     Recent Labs     07/10/22  0406 07/09/22  0454   Sodium 137 136   Potassium 5.4* 5.4*   Chloride 98* 94*   CO2 25 25   BUN 38.0* 75.0*   Creatinine 8.3* 14.5*   Glucose 94 79   Calcium 7.2* 6.9*   Magnesium 1.8 1.9   Phosphorus 3.9 5.0*     Recent Labs     07/10/22  0406 07/09/22  0454   AST (SGOT) 13 13   ALT 7 6   Alkaline Phosphatase 133* 121*   Protein, Total 6.4 6.2   Albumin 3.1* 3.1*     No results for input(s): "PTT", "PT", "INR" in the last 72 hours.                  Radiology Results (24 Hour)       Procedure Component Value Units Date/Time    XR Chest AP Portable [[id number]] Collected:  07/10/22 1820    Order Status: Completed Updated: 07/10/22 1823    Narrative:      HISTORY: SOB.      COMPARISON: 07/08/2022    FINDINGS:     Portable AP view of the chest shows stent overlying right upper mediastinum  and chest. There is mild pulmonary edema with right basilar atelectasis.  Cardiomediastinal silhouette is not enlarged.  No focal bony lesion is  seen.      Impression:          1. Mild pulmonary edema with right basilar atelectasis  Kristine Linea, MD  07/10/2022 6:21 PM            Mosie Lukes, MD  07/11/2022  12:56 PM  [phone]

## 2022-07-11 NOTE — Discharge Summary (Signed)
[facility]      Patient: Katherine [name]  Admission Date: 07/08/2022   DOB: Aug 28, 1985  Discharge Date: 07/11/2022    MRN: [id number]  Discharge Attending:Allyne Hebert Jenetta Downer, MD     Referring Physician: Oneita Hurt None, MD  PCP: Pcp, None, MD       DISCHARGE SUMMARY     Discharge Information   Admission Diagnosis:   Hyperkalemia    Discharge Diagnosis:   Active Hospital Problems    Diagnosis    Hyperkalemia   Volume overload  Missed multiple sessions of dialysis  ESRD on hemodialysis  Lupus nephritis  High anion gap metabolic acidosis  Anemia of chronic kidney disease  History of left renal infarction status post immobilization           Admission Condition: Poor  Discharge Condition: Stable  Consultants: Nephrology  Functional Status: Independent  Discharged to: Back to [facility] Course (3 Days)     From HPI,    Katherine [name] is a 37 y.o. female with history of lupus nephritis, end-stage renal disease currently on T/T/S hemodialysis, hypertension, recent left renal infarction status post embolization followed by renal hematoma in February 2024 for currently on Eliquis, SVC syndrome, visiting from [place] who presents to the ED for evaluation of 1 week history of progressive worsening nausea with vomiting and diarrhea along with left flank pain.  She states that her last hemodialysis was last Tuesday and she has missed 4 hemodialysis sessions due to transportation issues. She complains of left flank pain and nausea on my evaluation and requests IV phenergan every 8 hours for nausea and dilaudid for pain control. Denies suspicious food intake or interaction with sick contacts. She complains of shortness of breath, lower extremity swelling, palpitations.   She has no complaints of fever, chills, palpitations, dysuria, hematuria. She reports noting blood streaks in diarrhea and vomitus. States she has a history of gastritis and has undergone EGD in the  past.  Under chart review noted that patient was hospitalized in [place] for left renal hemorrhage/hematoma found on 07/02/2022 and she left AMA.  Noted a CT abdomen pelvis without contrast on 07/02/2022 shows no acute findings in the abdomen pelvis and significant decrease size of left renal hematoma.    In the ED, blood pressure 184/104, afebrile, not tachycardic, not hypoxic.  Labs pertinent for hemoglobin of 8, BUN/creatinine 72/13.9, potassium 6.2, high-sensitivity troponin 14, NT proBNP 20,000.  Chest x-ray showed subtle hazy opacity in bilateral lungs secondary to pulmonary edema.  Bilateral upper extremity venous Dopplers revealed no evidence of acute DVT.  Patient received hyperkalemia treatment with calcium gluconate, insulin/D10. Nephrology was consulted and recommendation to give Kayexalate 15 and IV Lasix 80. and admission requested.    During her stay she underwent 1 session of hemodialysis.  And 4 L of fluid was removed potassium after dialysis and cocktail 5.4 she got another dose of Kayexalate.  She remained hemodynamically stable.  She refused blood work this morning.  Case management and dialysis liaison spoke with her extensively regarding setting of hemodialysis chair in [place] however patient declined the help.  Patient indicated that she received hemodialysis at [facility] ED.  Case management spoke with [facility] ED via the phone and the facility confirmed that patient receive hemodialysis in ED but they did prefer that the patient to have her outpatient hemodialysis chair set up however  patient declined that help.  She is ready for discharge today, she will go directly from the hospital of the truck to go back to an outside.  She was advised to follow-up with hemodialysis at [facility] tomorrow she verbalized understanding             Progress Note/Physical Exam at Discharge     Subjective:Patient is stable for discharge.    Vitals:    07/11/22 0803 07/11/22 0911  07/11/22 1141 07/11/22 1307   BP: 142/83 142/83 101/66 101/66   Pulse: 89 89 89    Resp: 18  16    Temp: 97.9 F (36.6 C)  98.4 F (36.9 C)    TempSrc: Oral  Oral    SpO2: 98%  99%    Weight: 77.8 kg (171 lb 8.3 oz)      Height:               General: NAD, AAOx3  HEENT: Sclera anicteric, no conjunctival injection, OP: Clear, MMM  Neck: Supple, FROM, no LAD  Cardiovascular: RRR, no m/r/g  Lungs: CTAB, no w/r/r  Abdomen: Soft, +BS, NT/ND, no masses, no g/r  Extremities: No C/C/E  Skin: No rashes or lesions noted  Neuro: No Focal neurological deficits       Diagnostics     Labs/Studies Pending at Discharge: No    Last Labs   Recent Labs   Lab 07/10/22  0406 07/09/22  1012 07/09/22  0454 07/09/22  0008   WBC 2.82*  --  3.48 3.55   RBC 2.58*  --  2.45* 2.55*   Hgb 7.5*  7.6* 7.4* 7.1* 7.5*   Hematocrit 23.5*  23.6* 21.7* 22.0* 22.7*   MCV 91.5  --  89.8 89.0   Platelets 117*  --  117* 141*       Recent Labs   Lab 07/10/22  0406 07/09/22  0454 07/09/22  0008 07/08/22  2046 07/08/22  1611   Sodium 137 136 136 134* 136   Potassium 5.4* 5.4* 4.9 6.2* 6.2*   Chloride 98* 94* 92* 94* 92*   CO2 25 25 26 20 22    BUN 38.0* 75.0* 74.0* 72.0* 72.0*   Creatinine 8.3* 14.5* 14.4* 13.9* 13.9*   Glucose 94 79 108* 63* 75   Calcium 7.2* 6.9* 7.2* 7.3* 7.4*   Magnesium 1.8 1.9  --   --  2.0       Microbiology Results (last 15 days)       ** No results found for the last 360 hours. **             Patient Instructions   Discharge Diet: Renal diet  Discharge Activity: Unrestricted    Follow Up Appointment:   Follow-up Information       Pcp, None, MD .               Monte Fantasia, Leontine Locket, MD .    Specialty: Internal Medicine  Contact information:  [address]  [place] [address]  [phone]               your PCP Follow up in 1 week(s).                              Discharge Medications:     Medication List        START taking these medications      metoprolol succinate XL 25  MG 24 hr tablet  Commonly known as: TOPROL-XL  Take 1  tablet (25 mg) by mouth daily  Start taking on: July 12, 2022            CONTINUE taking these medications      amLODIPine 10 MG tablet  Commonly known as: NORVASC  Take 1 tablet (10 mg) by mouth daily     apixaban 2.5 MG  Commonly known as: ELIQUIS  Take 1 tablet (2.5 mg) by mouth every 12 (twelve) hours     hydrALAZINE 25 MG tablet  Commonly known as: APRESOLINE  Take 1 tablet (25 mg) by mouth 3 (three) times daily               Where to Get Your Medications        These [id number] [place], [place] - [address]  [address] [place] [address]      Hours: 24-hours Phone: [phone]   amLODIPine 10 MG tablet  apixaban 2.5 MG  hydrALAZINE 25 MG tablet  metoprolol succinate XL 25 MG 24 hr tablet          Time spent examining patient, discussing with patient/family regarding hospital course, chart review, reconciling medications and discharge planning: 40 minutes.    Signed,  Melina Fiddler, MD    3:12 PM 07/11/2022

## 2022-07-13 NOTE — UM Notes (Signed)
Requesting final auth to be faxed to [phone] or call [phone]    Auth Number :  [id number]    Discharge Date -  07/11/2022  4:10 PM    ER ADMIT DATE AND TIME: 07/08/2022  3:03 PM  OBS ADMIT DATE: 07/08/2022  3:03 PM  IP ADMIT DATE: 07/08/2022  3:03 PM                NAME: Waverly Tarquinio             MR#: [id number]      Patient Address:  [address] [place] [address]    Patient phone: [phone] (home)     PATIENT NAME: AZALIE, HARBECK  DOB: 12-Nov-1985   PMH:  has a past medical history of Acute post-hemorrhagic anemia, Anemia in chronic kidney disease (CKD), ESRD (end stage renal disease), ESRD on hemodialysis, Hyperkalemia, Hypertension, Left nephrolithiasis, and Systemic lupus erythematosus.  PSH:  has no past surgical history on file.      DIAGNOSIS:     ICD-10-CM    1. Hyperkalemia  E87.5

## 2022-09-08 NOTE — Anesthesia Post-Procedure Evaluation (Signed)
Patient: Meredith Hawkins    Procedure Summary       Date: 09/06/22 Room / Location: [facility] ENDO PROCEDURE ROOM 03 / [facility] ENDO    Anesthesia Start: 1536 Anesthesia Stop: 1555    Procedure: ESOPHAGOGASTRODUODENOSCOPY (ENDO) with cold forcep biopsies (Esophagus) Diagnosis: (hematemesis)    Providers: Berta Minor, MD PhD Responsible Provider: Royston Sinner, MD    Anesthesia Type: IV general without airway ASA Status: 4            Anesthesia Type: IV general without airway    Last vitals:   Vitals Value Taken Time   BP 158/90 09/07/22 0745   Temp 36.7 C (98.1 F) 09/07/22 0745   Pulse 83 09/07/22 0745   Resp 18 09/07/22 0825   SpO2 97 % 09/07/22 0745       Patient current location:  PACU     Level of consciousness:  returned to baseline  Pain management: adequate    Airway: patent  Respiratory status: adequate oxygentation  Cardiovascular status: HR and BP returned to baseline  Postoperative hydration: euvolemic      PONV: free of significant nausea and vomiting  Temperature: returned to baseline      #47  Patient age is less than 65          #130  Current medication list obtained, updated, or reviewed: Yes         #226  Current tobacco user?: No          #404  Current smoker: No          #424  At least one body temperature measurement => 35.5 degrees Celsius achieved within 30 minutes immediately before or the 15 minutes immediately after anesthesia end time        #430  Patient age is greater than or equal to 18  Post operative opioids intended: Yes    #463  Patient is not between ages 30 and 21        #477  Multimodal analgesia pain  management approach: Yes

## 2022-09-12 NOTE — Care Plan (Signed)
Voucher for Transportation and [employer]  [facility] Clinical Care Management    Voucher# = "MRN# + Request Date"  MRN# [id number]  Request Date 09/12/2022   Type of Transportation:   Discharge Transportation  Pick up location: [facility] Patient Meredith Hawkins  Patient Jul 30, 1985  Patient Facility: [facility]  Is voucher authorization by a department other than Care  Management? no         Transportation Voucher Info  Discharge Voucher Type: DC Taxi Voucher [address] [place] [address] and Taxi Operator: [employer] [phone] alternate number [phone]     Amount: $     [facility] Customer ID: [id number] NO ADDITIONAL STOPS and [facility] Customer ID: [id number] NO ADDITIONAL STOPS     Contact the CMA at [phone] when the patient is ready for lyft ride.  CMAs are available M-F 7:30a - 7p and Sat-Sun 7:30 a - 6p     *Universal Pandemic Precautions - all patients and families are instructed to wear masks during transport*    **Please ask driver to come in and ask for patient by name, and ask patient to give it to waiting room attendant.

## 2022-09-12 NOTE — Procedures (Signed)
HEMODIALYSIS NURSE PROCEDURE NOTE       Treatment Number:  1 Room / Station:  8    Procedure Date:  09/12/22 Device Name/Number: Tomma Lightning    Total Dialysis Treatment Time:  144 Min.    CONSENT:    Written consent was obtained prior to the procedure and is detailed in the medical record.  Prior to the start of the procedure, a time out was taken and the identity of the patient was confirmed via name, medical record number and date of birth.     WEIGHT:  Hemodialysis Pre-Treatment Weights        Date/Time Pre-Treatment Weight (kg) Estimated Dry Weight (kg) Patient Goal Weight (kg) Total Goal Weight (kg)    09/12/22 1845 -- * [facility] patient refused  -- * TBD  5 kg (11 lb 0.4 oz)  --                    Hemodialysis Post Treatment Weights        Date/Time Post-Treatment Weight (kg) Treatment Weight Change (kg)    09/12/22 2130 -- * [facility]  --                   Active Dialysis Orders (168h ago, onward)       Start     Ordered    09/12/22 1354  Hemodialysis inpatient  Every Mon, Wed, Fri      Question Answer Comment   Patient HD Status: Acute    New Start? No    K+ 2 meq/L    Ca++ 3 meq/L    Bicarb 35 meq/L    Na+ 137 meq/L    Na+ Modeling no    Dialyzer F180NRe    Dialysate Temperature (C) 36.5    BFR-As tolerated to a maximum of: 400 mL/min    DFR 800 mL/min    Duration of treatment 4 Hr    Dry weight (kg) tbd    Challenge dry weight (kg) no    Fluid removal (L) 5L    Tubing Adult = 142 ml    Access Site AVF    Access Site Location Right    Needle size 15        09/12/22 1353                  ASSESSMENT:  General appearance: alert and no distress  Neurologic: Grossly normal  Lungs: diminished breath sounds bilaterally  Heart: regular rate and rhythm  Abdomen: normal findings: bowel sounds normal  Pulses: 2+ and symmetric.  Skin:  Skin warm and dry     ACCESS SITE:       Hemodialysis Catheter 05/17/18 Venovenous catheter Left Femoral 2.5 mL 2.6 mL (Active)        Arteriovenous Fistula - Vein Graft  Access Right;Upper Arm  (Active)   Site Assessment Clean;Dry;Intact 09/12/22 2141   AV Fistula Thrill Present;Bruit Present 09/12/22 2141   Status Deaccessed 09/12/22 2141   Dressing Intervention New dressing 09/12/22 2141   Dressing Status      Clean;Dry;Intact/not removed 09/12/22 2141   Site Condition No complications 09/12/22 2141   Dressing Gauze 09/12/22 2141   Dressing To Be Removed (Date/Time) 06/25 @0600  watch out for bleeding 09/12/22 2141       Arteriovenous Fistula - Vein Graft  Access Arteriovenous fistula Right Arm (Active)       Arteriovenous Fistula - Vein Graft  Access Arteriovenous fistula Right Arm (Active)  Patient Lines/Drains/Airways Status       Active Peripheral & Central Intravenous Access       Name Placement date Placement time Site Days    Peripheral IV 09/12/22 Left Antecubital 09/12/22  1243  Antecubital  less than 1                   LAB RESULTS:  Lab Results   Component Value Date    NA 130 (L) 09/12/2022    K 5.8 (H) 09/12/2022    CL 99 09/12/2022    CO2 18.0 (L) 09/12/2022    BUN 67 (H) 09/12/2022    CREATININE 13.86 (H) 09/12/2022    GLU 82 09/12/2022    GLUF 71 06/02/2014    CALCIUM 8.0 (L) 09/12/2022    CAION 4.32 (L) 10/27/2021    PHOS 2.9 06/25/2022    MG 1.7 06/25/2022    PTH 319.1 (H) 06/25/2018    IRON 41 06/25/2018    LABIRON 17 06/25/2018    TRANSFERRIN 196.3 (L) 06/25/2018    TRANSFERRIN 196.3 (L) 06/25/2018    FERRITIN 331.0 (H) 06/25/2018    TIBC 247.3 (L) 06/25/2018     Lab Results   Component Value Date    WBC 3.2 (L) 09/12/2022    HGB 8.5 (L) 09/12/2022    HCT 25.0 (L) 09/12/2022    PLT 136 (L) 09/12/2022    PHART 7.30 (L) 08/01/2017    PO2ART 39 (LL) 08/01/2017    PCO2ART 59 (H) 08/01/2017    HCO3ART 29.0 (H) 08/01/2017    BEART 1.7 08/01/2017    O2SATART 72.2 (LL) 08/01/2017    APTT 30.2 05/18/2019    HEPBIGM Negative. 10/02/2011        VITAL SIGNS:   Temperature        Date/Time Temp Temp src       09/12/22 2130 36.8 C (98.2 F)  Oral                    Hemodynamics         Date/Time Pulse BP MAP (mmHg) Patient Position    09/12/22 2130 82  151/98  --  Lying     09/12/22 2100 81  --  --  Lying     09/12/22 2045 88  150/95  --  --     09/12/22 2030 82  158/109  --  Lying     09/12/22 2000 --  --  --  Lying     09/12/22 1945 83  167/102  --  Lying     09/12/22 1930 82  161/84  --  Lying     09/12/22 1915 --  --  --  Lying     09/12/22 1906 82  182/107  --  Lying     09/12/22 1855 83  166/98  --  Lying                   Blood Volume Monitor        Date/Time Blood Volume Change (%) HCT HGB Critline O2 SAT %    09/12/22 2045 -9 %  27.9  9.5  99.9     09/12/22 2030 -7.8 %  27.6  9.4  99.9     09/12/22 1945 -3.2 %  26.3  8.9  99.9     09/12/22 1930 -2.9 %  26.2  8.9  99.8  Oxygen Therapy        Date/Time Resp SpO2 O2 Device O2 Flow Rate (L/min)    09/12/22 2130 --  96 %  None (Room air)  --    09/12/22 2100 --  96 %  --  --    09/12/22 2030 --  98 %  --  --    09/12/22 1915 --  98 %  --  --                    Pre-Hemodialysis Assessment        Date/Time Therapy Number Dialyzer Hemodialysis Line Type All Machine Alarms Passed    09/12/22 1845 1  F-180 (98 mLs)  Adult (142 m/s)  Yes       Date/Time Air Detector Saline Line Double Clampled Hemo-Safe Applied Dialysis Flow (mL/min)    09/12/22 1845 Engaged  --  --  800 mL/min       Date/Time Verify Priming Solution Priming Volume Hemodialysis Independent pH Hemodialysis Machine Conductivity (mS/cm)    09/12/22 1845 0.9% NS  300 mL  --  13.8 mS/cm       Date/Time Hemodialysis Independent Conductivity (mS/cm) Bicarb Conductivity Residual Bleach Negative Total Chlorine    09/12/22 1845 13.8 mS/cm  -- Yes  0                   Pre-Hemodialysis Treatment Comments        Date/Time Pre-Hemodialysis Comments    09/12/22 1845 VSS, NAD patient arrived late, due to Transportation issues                   Hemodialysis Treatment        Date/Time Blood Flow Rate (mL/min) Arterial Pressure (mmHg) Venous Pressure (mmHg) Transmembrane Pressure  (mmHg)    09/12/22 2130 200 mL/min  -120 mmHg  100 mmHg  80 mmHg     09/12/22 2045 350 mL/min  -199 mmHg  142 mmHg  88 mmHg     09/12/22 2030 300 mL/min  -116 mmHg  134 mmHg  78 mmHg     09/12/22 1945 350 mL/min  -146 mmHg  119 mmHg  73 mmHg     09/12/22 1930 350 mL/min  -63 mmHg  82 mmHg  82 mmHg     09/12/22 1906 200 mL/min  -21 mmHg  79 mmHg  72 mmHg     09/12/22 1855 400 mL/min  42 mmHg  74 mmHg  30 mmHg       Date/Time Ultrafiltration Rate (mL/hr) Ultrafiltrate Removed (mL) Dialysate Flow Rate (mL/min) KECN Linna Caprice)    09/12/22 2130 2030 mL/hr  3685 mL  800 ml/min  --     09/12/22 2045 2030 mL/hr  2480 mL  800 ml/min  --     09/12/22 2030 1600 mL/hr  1998 mL  800 ml/min  --     09/12/22 1945 1390 mL/hr  894 mL  800 ml/min  --     09/12/22 1930 1390 mL/hr  554 mL  800 ml/min  --     09/12/22 1906 1390 mL/hr  5 mL  800 ml/min  --     09/12/22 1855 0 mL/hr  0 mL  500 ml/min  --                   Hemodialysis Treatment Comments        Date/Time Intra-Hemodialysis Comments    09/12/22 2130 HD completed, not in distress  09/12/22 2030 patient watching on her computer     09/12/22 1945 patient comfortably sitting watching in her  computer, and requesting for painmedication, Encourage other measure deepbreathing and comfort, initiatlly and patient started to get upset Stating " White People get pain medication just like that, Black people don't", and saying that she dont have  time for  this treatment, explanation of the importance of completing treatment     09/12/22 1915 updated Dr Jimmye Norman patient requesting for pain and nausea medication     09/12/22 1906 TX INITIATED                   Post Treatment        Date/Time Rinseback Volume (mL) On Line Clearance: spKt/V Total Liters Processed (L/min) Dialyzer Clearance    09/12/22 2130 300 mL  --  48 L/min  Moderately streaked                   Post Hemodialysis Treatment Comments        Date/Time Post-Hemodialysis Comments    09/12/22 2130 VSS, NAD                    Hemodialysis I/O        Date/Time Total Hemodialysis Replacement Volume (mL) Total Ultrafiltrate Output (mL)    09/12/22 2130 --  3135 mL                     [id number]-02A - Medicaitons Given During Treatment  (last 3 hrs)           REYES, RALPH WARREN P, RN         Medication Name Action Time Action Route Rate Dose User     HYDROmorphone (PF) injection Syrg 0.5 mg 09/12/22 2002 Given Intravenous  0.5 mg Jeri Cos, RN     diphenhydrAMINE (BENADRYL) capsule/tablet 25 mg (Or Linked Group #1) 09/12/22 2010 Given Oral  25 mg Jeri Cos, RN     diphenhydrAMINE (BENADRYL) injection (Or Linked Group #1) 09/12/22 2010 See Alternative Intravenous   Jeri Cos, RN                      Patient tolerated treatment in a  Dialysis Recliner.  *Some images could not be shown.

## 2022-09-12 NOTE — Care Plan (Signed)
Care Management  Final Transition Planning Assessment      CM received referral from ED Treatment Team  to assist patient with transportation home from the [facility] Emergency Department. CM reviewed pt's chart and spoke with patient in ED. CM introduced self and role. . Pt able to verify address and states she is able to ambulate/walk without assistance.  CM to provide taxi voucher.  ED Treatment Team in agreement with discharge plan. Taxi voucher placed in chart under transition notes. RN to print the form and provide to the patient. CM updated ED treatment team via secure chat. ED Trackboard sticky note updated. No other needs identified at this time.Malgorzata Madelynn Done, RN September 12, 2022 10:44 PM        Patient's Post Acute Contact Information: [phone]       Has a PCP appointment been made?: No     Has a specialist appointment been made?: yes  Future Appointments   Date Time Provider Department Center   10/11/2022 12:00 PM Elby Beck, NP [facility]   10/20/2022 11:40 AM Dancy, Monica Becton, FNP [facility]          Post Acute Facility needed at discharge?: No            Home Care/ Home Medical Equipment needed at discharge?: No                Outpatient/Community Referrals needed for discharge?: No          Transportation Anticipated: taxi   [employer] - Discharged on 09/12/2022 Admission date: 09/12/2022 - Discharge disposition: Home with Self Care      Service Provider Selected Services Address Phone Fax Patient Preferred    [facility] [address]Janalyn Rouse [place] [address] [phone] -- --           Currently receiving outpatient dialysis?: No (pt was discharged from [facility] HD clinic on June 10th. (pls see CM note for details))           Discharge Disposition: Home w/ Self Care              Quality data for continuing care services shared with patient and/or representative?: N/A  Patient and/or family were provided with  choice of facilities / services that are available and appropriate to meet post hospital care needs?: N/A       Discharge Packet Contents: Other, see Notes, Transportation Order (RN to provide AVS)    Final Assessment Complete: Yes                                  Readmission Risk Score:   Predictive Model Details   No score data available for [facility] Risk of Unplanned Readmission

## 2022-09-12 NOTE — ED Triage Notes (Signed)
Here with chest pain and shortness of breath. Dialysis patient

## 2022-09-12 NOTE — ED Notes (Signed)
Bed: HALL-02A  Expected date:   Expected time:   Means of arrival:   Comments:

## 2022-09-12 NOTE — Consults (Signed)
-------------------------------------------------------------------------------  Attestation signed by Neysa Hotter, MD at 09/12/22 1611  I saw and evaluated the patient, participating in the key portions of the service.  I reviewed the subspecialty fellow's note and I agree with the findings and plan.      Rachel Moulds, M.D.  [id number]  September 12, 2022 4:11 PM    -------------------------------------------------------------------------------        Nephrology ESKD Consult Note    Requesting provider: Tonna Boehringer, MD  Service requesting consult: Emergency Medicine  Reason for consult: Evaluation for dialysis needs    Outpatient dialysis unit:   [facility]  [address]  [place] [address]     Assessment/Recommendations: Meredith Hawkins is a 37 y.o. 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    # ESKD:   - Outpatient dialysis records reviewed but no central records from unit given that she has been presenting to ED for intermittent sessions  - Indication for acute dialysis?: Yes, shortness of Breath and Yes, hyperkalemia  - We will perform HD starting today and will otherwise attempt to continue pt's outpatient schedule if possible.   - Access: RUE AV fistula; Vascular Access functioning well- no indication for intervention.  - Hepatitis status: Hepatitis B surface antigen negative on 08/22/22.  Would ask CM to check if pt still has iHD center in [place] at [facility] or if she has been discharged from that center as well. Pt unclear if she is still a pt there. She states she was told to present to the ED.      # Volume / Hypertension:   - Volume: Will attempt to achieve dry weight if tolerated  - Will adjust daily as needed    # Acid-Base / Electrolytes:   - Will evaluate acid-base status and electrolyte balance daily and adjust dialysate as needed.  Lab Results   Component Value Date     K 5.8 (H) 09/12/2022    CO2 18.0 (L) 09/12/2022       # Anemia of Chronic Kidney Disease:     Lab Results   Component Value Date    HGB 8.5 (L) 09/12/2022   Unclear outpt records given pt not presenting to outpt unit regularly   EPO on med list but no dose, 8K Units today     # Secondary Hyperparathyroidism/Hyperphosphatemia:   - Continue outpatient phosphorus binders and adjust as needed.   Lab Results   Component Value Date    PHOS 2.9 06/25/2022    CALCIUM 8.0 (L) 09/12/2022     calcium acetate (PHOSLO) 667 mg capsule Take 4 capsules (2,668 mg total) by mouth Three (3) times a day with a meal.           Danae Orleans, MD  09/12/2022 1:38 PM   Medical decision-making for 09/12/22  Findings / Data     Patient has: []  acute illness w/systemic sxs  [mod]  []  two or more stable chronic illnesses [mod]  []  one chronic illness with acute exacerbation [mod]  []  acute complicated illness  [mod]  []  Undiagnosed new problem with uncertain prognosis  [mod] [x]  illness posing risk to life or bodily function (ex. AKI)  [high]  []  chronic illness with severe exacerbation/progression  [high]  []  chronic illness with severe side effects of treatment  [high] ESKD on RRT Probs At least 2:  Probs, Data, Risk  I reviewed: []  primary team note  []  consultant note(s)  [x]  external records [x]  chemistry results  [x]  CBC results  []  blood gas results  []  Other []  procedure/op note(s)   []  radiology report(s)  []  micro result(s)  []  w/ independent historian(s) Outside dialysis prescription reviewed , K and Hb addressed above =3 Data Review (2 of 3)    I independently interpreted: []  Urine Sediment  []  Renal US []  CXR Images  []  CT Images  []  Other []  EKG Tracing  Any     I discussed: []  Pathology results w/ QHPs(s) from other specialties  []  Procedural findings w/ QHPs(s) from other specialties []  Imaging w/ QHP(s) from other specialties  [x]  Treatment plan w/ QHP(s) from other specialties Plan discussed with primary team Any      Mgm't requires: []  Prescription drug(s)  [mod]  []  Kidney biopsy  [mod]  []  Central line placement  [mod] []  High risk medication use and/or intensive toxicity monitoring [high]  [x]  Renal replacement therapy [high]  []  High risk kidney biopsy  [high]  []  Escalation of care  [high]  []  High risk central line placement  [high] RRT: High risk of complications from RRT requiring intensive monitoring Risk        _____________________________________________________________________________________    History of Present Illness: Meredith Hawkins is a 37 y.o. 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 Nephrology has been consulted for evaluation of dialysis needs in the setting of end-stage kidney disease.    Pt states she had a session last Monday at [facility]. No issues during iHD. Her BP stays elevated and she usually has 5L removed given that she is over her EDW of 77.5kg. Pt does not have intradyalytic hypotension, cramping, N/V/HA. She has noted the last few days the onset of orthopnea 2/2 fluid build up in her breasts. Her RUE AVF has not had issues recently though there is mild skin discoloration in three areas over the fistula that are stable in size and color for several months. Pt has been discharged from all the iHD units in [facility] so currently at [facility] in [place] but unclear if she still is a pt there. She states she was told by the facility to present to the ED for iHD treatments.     INPATIENT MEDICATIONS:  Current Facility-Administered Medications:   .  promethazine (PHENERGAN) 12.5 mg in sodium chloride (NS) 0.9 % 25 mL infusion, Intravenous, Once    OUTPATIENT MEDICATIONS:  Prior to Admission medications    Medication Dose, Route, Frequency   amLODIPine (NORVASC) 10 MG tablet 10 mg,  Oral, Daily (standard)   apixaban (ELIQUIS) 2.5 mg Tab 2.5 mg, Oral, 2 times a day (standard)   calcium acetate (PHOSLO) 667 mg capsule 2,668 mg, Oral, 3 times a day (with meals)   epoetin alfa (EPOGEN INJ) hemodialysis port injection, Every Mon-Wed-Fri   hydrALAZINE (APRESOLINE) 25 MG tablet 25 mg, Oral, 3 times a day (standard)   ipratropium-albuteroL (COMBIVENT) 20-100 mcg/actuation Mist inhaler 1 puff, Inhalation, 4 times a day   pantoprazole (PROTONIX) 40 MG tablet 40 mg, Oral, Daily (standard)        ALLERGIES  Ace inhibitors, Bactrim [sulfamethoxazole-trimethoprim], Codeine, Enalapril, Enalapril maleate, Fentanyl, Hydrocodone, Lynox [oxycodone-acetaminophen], Nsaids (non-steroidal anti-inflammatory drug), Ondansetron hcl, Sulfa (sulfonamide antibiotics), Tylenol [  acetaminophen], and Tylenol-codeine solution    MEDICAL HISTORY  Past Medical History:   Diagnosis Date   . Asthma    . ESRD on dialysis (CMS-HCC)    . GERD (gastroesophageal reflux disease)    . Hypertension    . Lupus (CMS-HCC)    . Lupus nephritis (CMS-HCC)    . Superior vena cava syndrome    . Ventricular septal defect (VSD)        SOCIAL HISTORY  Social History     Social History Narrative    ** Merged History Encounter **           reports that she has never smoked. She has never used smokeless tobacco. She reports that she does not drink alcohol and does not use drugs.     FAMILY HISTORY  Family History   Problem Relation Age of Onset   . Diabetes Mother    . Hypertension Mother    . Hypertension Father    . Diabetes Father         Physical Exam:  Vitals:    09/12/22 1118   BP: 171/101   Pulse: 86   Resp: 18   Temp: 36.9 C (98.4 F)   SpO2: 99%     No intake/output data recorded.  No intake or output data in the 24 hours ending 09/12/22 1338    General: well-appearing, no acute distress  Heart: RRR, no m/r/g  Lungs: CTAB, normal wob  Abd: soft, non-tender, non-distended  Ext: 1+ edema  Access: RUE AV fistula good bruit and thrill

## 2022-09-12 NOTE — Nursing Note (Addendum)
CM consulted by Ed Provider, Dr. Lucia Bitter E to clarify whether the pt has been discharged from [facility] in [place]. CM called [facility] and spoke with Lurena Joiner Electronics engineer). Lurena Joiner stated,   the pt was discharged from [facility] on June 10th. (the pt had only 1 HD with [facility] on April 9th). Per Lurena Joiner, the pt reported that the HD clinic in [place] was located too far away and it was hard for the pt to arrange transport to the clinic.  Per Vicie Mutters [facility] connected the pt with [facility] to help the pt  find a different  dialysis clinic, but the pt declined. Per Lurena Joiner (an Environmental health practitioner), the pt lives in [place] but she was denied by other HD clinics due to behavior. CM updated Ed Provider, Dr. Julien Girt, Ronaldo Miyamoto E on above findings. Malgorzata Madelynn Done, RN September 12, 2022 3:46 PM

## 2022-09-12 NOTE — Procedures (Addendum)
Into HDU via stretcher, not in acute distress, patient alert and oriented,denies SOB and any discomfort. Delayed arrival due to transportation issues. Bruit and thrill noted at right AVF. Attempting 5L for 4 hours as tolerated, machine alarm limits within normal range.     Patient requesting for Nausea/pain and benadryl. Updated Dr Jimmye Norman See HD treatment sheet for more details.      09/12/22 1354  Hemodialysis inpatient  Every Mon, Wed, Fri      Question Answer Comment   Patient HD Status: Acute    New Start? No    K+ 2 meq/L    Ca++ 3 meq/L    Bicarb 35 meq/L    Na+ 137 meq/L    Na+ Modeling no    Dialyzer F180NRe    Dialysate Temperature (C) 36.5    BFR-As tolerated to a maximum of: 400 mL/min    DFR 800 mL/min    Duration of treatment 4 Hr    Dry weight (kg) tbd    Challenge dry weight (kg) no    Fluid removal (L) 5L    Tubing Adult = 142 ml    Access Site AVF    Access Site Location Right    Needle size 15

## 2022-09-12 NOTE — ED Triage Notes (Signed)
Presents with generalized chest pain that started Saturday. Also endorses right flank pain and generalized abdominal pain. N/V. No fevers. Intermittent blood in emesis. Last HD session was approx Monday.

## 2022-09-15 NOTE — Consults (Addendum)
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 Patient receives HD by coming through the ED. Pt was discharged from [facility] on June 10th. The patient states the clinic in [place] was located to far and she was having difficulty with transportation arrangements. Nephrology is consulted for dialysis needs. Last HD session was on 09/07/2022.     Pt with complaints of n/v, bilateral breast swelling, shortness of breath with exertion, right arm pain and flank pain.Pt states her dry weight is around 77.5kg. Nephrology consulted for maintenance dialysis.         HD Rx:  Unit: NO HD UNIT    ROS:    The remainder of the comprehensive 14 system review was negative except as above.    PMH:  Patient Active Problem List    Diagnosis Date Noted   . Generalized abdominal pain 02/04/2022   . Hyperkalemia 04/02/2021   . Thrombosis of superior vena cava, chronic (CMS/HHS-HCC) 04/02/2021   . End stage renal disease (CMS/HHS-HCC)    . Evaluation by psychiatric service required 08/17/2016   . Edema of upper extremity 08/10/2016   . Volume overload 08/09/2016   . Metabolic acidosis 08/09/2016   . Renal failure 08/09/2016   . Systemic lupus erythematosus, unspecified SLE type, unspecified organ involvement status (CMS/HHS-HCC)    . Septicemia , unspecified (CMS/HHS-HCC) 04/14/2015   . ESRD on hemodialysis (CMS/HHS-HCC) 03/03/2015   . Acute post-hemorrhagic anemia 09/03/2014   . Hematoma of left kidney 09/03/2014   . Anemia in chronic kidney disease, on chronic dialysis (CMS/HHS-HCC) 09/03/2014   . Asthma without status asthmaticus (HHS-HCC) 08/27/2014   . Chronic generalized pain, unspecified 01/03/2014   . Chronic  tension headaches 09/01/2013   . Acute sinusitis, unspecified 09/01/2013   . Chest pain, rule out acute myocardial infarction 08/19/2013   . Chronic abdominal pain, unspecified    . Hypertension 07/05/2013   . Iron deficiency anemia 01/29/2013   . Red blood cell antibody positive, compatible PRBC difficult to obtain 01/16/2013   . Asthma (HHS-HCC) 01/07/2013   . Myalgia 01/05/2013   . Chronic atypical chest pain 08/11/2012   . Lupus (CMS/HHS-HCC)    . Lupus nephritis (HCC)    . VSD (ventricular septal defect) (HHS-HCC)          Social History     Socioeconomic History   . Marital status: Single   . Number of children: 0   Occupational History   . Occupation: Disable   Tobacco Use   . Smoking status: Never   . Smokeless tobacco: Never   Vaping Use   . Vaping status: Never Used   Substance and Sexual Activity   . Alcohol use: No   . Drug use: No   . Sexual activity: Never   Social History Narrative    Single. Lives alone in [place], [place]. On disability. 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     Social Determinants of Health     Financial Resource Strain: Patient Declined (07/20/2022)    Overall Financial Resource Strain (CARDIA)    . Difficulty of Paying Living Expenses: Patient declined   Recent Concern:  Financial Resource Strain - High Risk (06/12/2022)    Overall Financial Resource Strain (CARDIA)    . Difficulty of Paying Living Expenses: Hard   Food Insecurity: Patient Declined (07/20/2022)    Hunger Vital Sign    . Worried About Programme researcher, broadcasting/film/video in the Last Year: Patient declined    . Ran Out of Food in the Last Year: Patient declined   Recent Concern: Food Insecurity - Food Insecurity Present (06/12/2022)    Hunger Vital Sign    . Worried About Programme researcher, broadcasting/film/video in the Last Year: Often true    . Ran Out of Food in the Last Year: Often true   Transportation Needs: Patient Unable To Answer (07/20/2022)    PRAPARE - Transportation    . Lack of Transportation (Medical): Patient unable  to answer    . Lack of Transportation (Non-Medical): Patient unable to answer   Recent Concern: Transportation Needs - Unmet Transportation Needs (07/16/2022)    PRAPARE - Transportation    . Lack of Transportation (Medical): Yes    . Lack of Transportation (Non-Medical): Yes   Stress: Stress Concern Present (11/05/2020)    [employer] of Occupational Health - Occupational Stress Questionnaire    . Feeling of Stress : To some extent   Social Connections: Unknown (12/20/2021)    Received from [facility]    Social Connections    . Frequency of Communication with Friends and Family: Not asked    . Frequency of Social Gatherings with Friends and Family: Not asked    Received from [facility]    Housing Stability         Family History   Problem Relation Name Age of Onset   . Diabetes type II Mother     . Diabetes type II Father     . Coronary Artery Disease (Blocked arteries around heart) Neg Hx     . Lupus Neg Hx     . Anesthesia problems Neg Hx           Allergies:   Allergies   Allergen Reactions   . Ace Inhibitors Anaphylaxis   . Bactrim [Sulfamethoxazole-Trimethoprim] Anaphylaxis   . Codeine Anaphylaxis   . Enalapril Anaphylaxis   . Fentanyl Anaphylaxis     Tolerates dilaudid   . Hydrocodone Anaphylaxis   . Oxycodone Anaphylaxis     Tolerates dilaudid   . Sulfa (Sulfonamide Antibiotics) Anaphylaxis   . Tylenol [Acetaminophen] Anaphylaxis   . Tylenol-Codeine Solution Anaphylaxis   . Nsaids (Non-Steroidal Anti-Inflammatory Drug) Other (See Comments)     Tolerated ibuprofen during admission May 2018         Home meds:  No current facility-administered medications on file prior to encounter.     Current Outpatient Medications on File Prior to Encounter   Medication Sig Dispense Refill   . amLODIPine (NORVASC) 10 MG tablet Take 10 mg by mouth once daily     . apixaban (ELIQUIS) 2.5 mg tablet Take 2.5 mg by mouth every 12 (twelve) hours     . calcium acetate,phosphat bind, (PHOSLO) 667 mg  capsule Take 2 capsules (1,334 mg total) by mouth 3 (three) times daily with meals for 30 days 180 capsule 0   . hydrALAZINE (APRESOLINE) 25 MG tablet Take 25 mg by mouth 3 (three) times daily     . pantoprazole (PROTONIX) 40 MG DR tablet Take 40 mg by mouth once daily  Active Meds:  . HYDROmorphone  1 mg Oral Once         O:     Current Vital Signs 24h Vital Sign Ranges   T 37 C (98.6 F) Temp  Avg: 37 C (98.6 F)  Min: 37 C (98.6 F)  Max: 37 C (98.6 F)   BP (!) 173/108 BP  Min: 173/108  Max: 173/108   HR 91 Pulse  Avg: 91  Min: 91  Max: 91   RR 18 Resp  Avg: 18  Min: 18  Max: 18   SaO2     No data recorded             Ins &  Outs No intake/output data recorded. Current Shift:   No intake/output data recorded.       PE:  General: Pleasant and conversant, appears comfortable, in no distress     Respiratory: clear to auscultation clear to percussion, Normal respiratory effort   Cardiovascular:  regular rate and rhythm, S1, S2 normal, no murmur, click, rub or gallop  no edema, cyanosis or clubbing   Gastrointestinal: Soft, non-tender; bowel sounds normal; no masses, no organomegaly.  Musculoskeletal:  No clubbing or cyanosis.    Skin: No obvious rash or induration.  Psych: A&Ox3. Speech fluent and thought process linear   Access: Right upper arm AVF +bruit and thrill      No results for input(s): "WBC", "HGB", "PLT" in the last 168 hours.  No results for input(s): "NA", "K", "CL", "CO2", "BUN", "CREATININE", "CALCIUM", "ALKPHOS", "GLUCOSE" in the last 168 hours.    Invalid input(s): "LABALBU", "PROT"  Lab Results   Component Value Date    PTH 99 (H) 04/07/2021    CALCIUM 7.3 (L) 09/04/2022    CAION 0.90 (L) 11/05/2020    PHOS 3.5 08/21/2022     Lab Results   Component Value Date    IRON 81 06/11/2022    TIBC 237 (L) 06/11/2022    FERRITIN 1,096 (H) 06/14/2022    PCTSAT 34 06/11/2022         Assessment and Recommendations:     ESKD on HD  - Continue routine HD; No urgent indication for dialysis at  present  - Will plan on doing dialysis today  -  2 K/ 2.5 Ca bath  - Anticoagulation: 1000  unit bolus +500 units/hr   - Consent signed and in HD unit  - Please dose all medications for GFR < 10.  - Avoid morphine, demerol, magnesium-containing products, baclofen    Renal osteodystrophy  - Phos goal 3.5-5.5  - Check phosphorus on dialysis days  - Continue binders     Anemia  - Hgb is not within  goal  - Continue ESA with HD as above    Access  - No active issues      Thank you for this consult. We will continue to follow the patient with you.   Please feel free to call with questions or concerns.     Alfredo Batty, NP  Maintenance service pager [phone]       Attestation Statement:     I personally saw the patient and performed a substantive portion of the medical decision making, in conjunction with the Advanced Practice Provider for the condition/treatment of this patient with ESRD.     Bradd Burner, MD      History is as detailed above.  Ms. Rutz told me that she underwent HD on 09/12/2022.  She  feels as if she is fluid overloaded.    Chest quiet.  Breathing not labored.  RRR.  No rub.  No lower extremity edema.    We plan HD today.  We plan 3-hour HD session and 1 hour of isolated ultrafiltration.  Ultrafiltration goal will be 4 L.    I would like to perform HD again tomorrow prior to discharge.      Roxanna Mew, M.D.

## 2022-09-16 NOTE — ED Notes (Signed)
Pt IV not working.   Request for USGIV.   Pt also ask for pulse ox to be removed from finger.

## 2022-09-16 NOTE — H&P (Signed)
-------------------------------------------------------------------------------  Attestation signed by Elease Hashimoto, MD at 09/16/2022  5:04 PM  Patient familiar to hospitalist group and nephrology group  Addressing issues which are dialysis and anemia  Had recent AMA discharge from [facility] before receiving transfusion    Issues of pain meds --will not be offering dilaudid  Trial of lidoderm patch     J [place] MD  -------------------------------------------------------------------------------          Admission Note (H&P)    Admitting Service:  Hospitalist Medicine  Primary Care Physician:  [facility]    Chief Complaint: Fatigue, Generalized Pain     HPI:  This 37 y.o. 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     Patient with frequent visits to the ED (> 10 in the last month) for chronic complaints. She has been receiving her HD via ED due to history of non-compliance and rejection from HD centers due to behavior issues. She has an ongoing lawsuit at her prior HD bed at [facility] in [place] per chart review.     She was recently admitted on 6/17 with complaints of hematemesis with signs of moderately severe erosive gastropathy on EGD (6/18) and was discharged on 09/07/2022 with Hgb of 8.7 with a prescription for Protonix 40 mg daily. Patient restarted her Eliquis on 6/20 as instructed.  Patient presented to [facility] on 6/24 due to dyspnea and abdominal/right sided flank pain. She received HD and was discharged shortly after. She presented again to [facility] ED on 09/15/2022 for HD. After HD, patient was confronted about a noted lab value of Hgb 6.0. Prior to transfusion patient left AMA due to feeling "racially profiled".         She presents to [facility] a few hours later, patient  presents HDS with HR of 90 bpm, normotensive BP of 146/80 mmHg afebrile and satting 100% on RA.  Work up revealed an anemia of 6.4 (down from 8.7 last week). All other labs are non actionable (Na 141, K 4.5, , Cr 8.10 (ESRD), Glucose 91, Cal 8.6, Mag 1.8, WBC 3.5.  EKG was personally reviewed and reveals NSR without ischemic changes. Pt dry weight around 77.5 kg. Patient is guaiac positive. Per nephrology's recommendations, patient to receive 2 units of blood. Hospitalist team was contacted for admission.     Upon arrival patient is noted to be sleeping while receiving 2 units of blood. As she awakes she is complaining of pain over her right shoulder and is asking for pain medicine, (specifically dilauded). She is also feeling nauseous and asking for Phenergin. Denies current chest pain, SOB, fever/chills.         Past Medical History:   Diagnosis Date   . Asthma    . Dialysis patient (CMS/HCC)    . Hypertension    . Hypoglycemia 04/22/2020   . Lupus (CMS/HCC)    . Renal disorder    . Renal hemorrhage, left 07/03/2022   . Superior vena cava syndrome    . VSD (ventricular septal defect and aortic arch hypoplasia      Past Surgical History:   Procedure Laterality Date   . hematoma removal     . INTERVENTIONAL RADIOLOGY VASCULAR  04/25/2019   . removal chest port       Prior to Admission  medications        albuterol-ipratropium (COMBIVENT) 18-103 mcg/actuation inhaler Inhale 2 puffs every 4 (four) hours as needed for wheezing or shortness of breath.   amLODIPine (NORVASC) 10 MG tablet Take 1 tablet (10 mg total) by mouth daily.   apixaban (ELIQUIS) 2.5 mg Tab tablet Take 1 tablet (2.5 mg total) by mouth 2 (two) times a day.   calcium acetate,phosphat bind, (PHOSLO) 667 mg capsule Take 2 capsules (1,334 mg total) by mouth 3 (three) times a day with meals.   hydrALAZINE (APRESOLINE) 25 MG tablet Take 1 tablet (25 mg total) by mouth 3 (three) times a day.   metoprolol succinate (TOPROL-XL) 25 MG 24 hr tablet Take 1 tablet  (25 mg total) by mouth daily.   pantoprazole (PROTONIX) 40 MG tablet Take 1 tablet by mouth daily for 30 days.     Allergies:    Allergies   Allergen Reactions   . Ace Inhibitors Anaphylaxis   . Acetaminophen Anaphylaxis   . Enalapril Maleate Anaphylaxis   . Fentanyl Anaphylaxis     Tolerates dilaudid     . Hydrocodone Anaphylaxis     Pt has had morphine in the past, states it makes her nauseous   . Oxycodone Anaphylaxis   . Oxycodone-Acetaminophen Anaphylaxis   . Sulfa (Sulfonamide Antibiotics) Anaphylaxis and Other (See Comments)     Other reaction(s): Anaphylaxis   . Sulfamethoxazole-Trimethoprim Anaphylaxis   . Tylenol-Codeine Solution Anaphylaxis   . Nsaids (Non-Steroidal Anti-Inflammatory Drug) Rash     Lupus patient; states can take phenergan  Not an actual allergy, pt cannot take as a dialysis pt     Social History:  No prior tobacco or ETOH use     Family History: History reviewed. No pertinent Family History.    Code Status:  Full Code    Review of Systems:  All other systems were reviewed and found to be negative except as noted above.       Physical Examination:  VITAL SIGNS: BP 164/85   Pulse 82   Temp 98.5 F (36.9 C)   Resp 16   LMP 08/24/2022   SpO2 100%      CONSTITUTIONAL: Chronically Ill-appearing female in no acute distress (sleeping).   HEENT: Pupils equal, round, and reactive to light bilaterally. Extraocular movements intact. Conjunctivae clear, sclerae anicteric. Oropharynx clear, mucous membranes moist.  NECK: Neck supple, no obvious masses or thyromegaly.  CARDIOVASCULAR: Heart rhythm regular, normal S1 and S2. Distal pulses equal bilaterally. No jugular venous distention. No peripheral edema.  RESPIRATORY: Chest clear to auscultation bilaterally, normal work of breathing.  ABDOMEN: Abdomen soft, nontender, with normoactive bowel sounds.   MUSCULOSKELETAL: Extremities warm and well perfused, no tenderness, normal muscle tone.   SKIN: No apparent skin rashes or lesions.  NEUROLOGIC:  Normal speech, no focal findings. Mental status alert and oriented.  PSYCHIATRIC: Mood and affect normal.    Laboratory Data:    Recent Results (from the past 24 hour(s))   CBC with Platelet and Differential    Collection Time: 09/16/22  4:29 AM   Result Value Ref Range    Differential Percent Diff %     Differential Absolute Diff Absolute     WBC See Text 3.6 - 11.2 K/uL    RBC See Text 3.63 - 4.92 M/uL    Hemoglobin See Text 10.9 - 14.3 g/dL    Hematocrit See Text 31 - 42 %    Mean Cell Volume See Text 74 - 96 fL  Mean Cell Hemoglobin See Text 24 - 33 pg    Mean Cell Hemoglobin Concentration See Text 33 - 36 g/dL    RDW See Text 02.7 - 17.0 %    Platelet Count See Text 150 - 450 K/uL    Mean Platelet Volume See Text 7.5 - 11.2 fL    Neutrophils See Text 43 - 77 %    Lymphocytes See Text 16 - 44 %    Monocytes See Text 5 - 13 %    Eosinophils See Text 1 - 8 %    Basophils See Text 0 - 1 %    Neutrophils Absolute See Text 1.8 - 7.8 K/uL    Lymphocytes Absolute See Text 1.0 - 3.0 K/uL    Monocytes Absolute See Text 0.3 - 1.0 K/uL    Eosinophils Absolute See Text 0.0 - 0.5 K/uL    Basophils Absolute See Text 0.0 - 0.1 K/uL    Nucleated RBCS See Text /100 WBC   CMP    Collection Time: 09/16/22  4:29 AM   Result Value Ref Range    Sodium 141 136 - 145 mmol/L    Potassium 4.5 3.5 - 5.1 mmol/L    Chloride 98 (L) 99 - 108 mmol/L    CO2 28 21 - 31 mmol/L    BUN 54 (H) 7 - 25 mg/dL    Creatinine 2.53 (H) 0.51 - 1.00 mg/dL    Glucose, Random 91 70 - 199 mg/dL    Calcium, Total 8.6 (L) 8.8 - 10.6 mg/dL    Osmolality (calculated) 296 (H) 270 - 295 mOsm/kg    Anion Gap 15 (H) 3 - 11    Albumin 3.9 3.5 - 5.7 g/dL    Bilirubin, Total 0.3 0.3 - 1.0 mg/dL    Alkaline Phosphatase 145 (H) 34 - 104 IU/L    ALT 7 7 - 52 IU/L    AST 10 (L) 13 - 39 IU/L    Protein, Total 6.7 6.4 - 8.9 g/dL    Albumin/Globulin Ratio 1.4 1.2 - 2.3   Magnesium Level    Collection Time: 09/16/22  4:29 AM   Result Value Ref Range    Magnesium 1.8 1.6 - 2.7  mg/dL   eGFR (CKD-EPI)    Collection Time: 09/16/22  4:29 AM   Result Value Ref Range    eGFR 6 (A) mL/min/1.9m2   CBC with Differential    Collection Time: 09/16/22  6:33 AM   Result Value Ref Range    Differential Percent Diff %     Differential Absolute Diff Absolute     WBC 3.5 (L) 3.6 - 11.2 K/uL    RBC 2.02 (L) 3.63 - 4.92 M/uL    Hemoglobin 6.4 (L) 10.9 - 14.3 g/dL    Hematocrit 19 (L) 31 - 42 %    Mean Cell Volume 93 74 - 96 fL    Mean Cell Hemoglobin 31 24 - 33 pg    Mean Cell Hemoglobin Concentration 34 33 - 36 g/dL    RDW 66.4 40.3 - 47.4 %    Platelet Count 152 150 - 450 K/uL    Mean Platelet Volume 8.0 7.5 - 11.2 fL    Neutrophils 62 43 - 77 %    Lymphocytes 19 16 - 44 %    Monocytes 11 5 - 13 %    Eosinophils 6 1 - 8 %    Basophils 2 (H) 0 - 1 %    Neutrophils Absolute  2.1 1.8 - 7.8 K/uL    Lymphocytes Absolute 0.7 (L) 1.0 - 3.0 K/uL    Monocytes Absolute 0.4 0.3 - 1.0 K/uL    Eosinophils Absolute 0.2 0.0 - 0.5 K/uL    Basophils Absolute 0.1 0.0 - 0.1 K/uL    Nucleated RBCS 0 /100 WBC   Prepare RBC:# of Units: 2    Collection Time: 09/16/22 10:49 AM   Result Value Ref Range    Red Blood Cells [id number]    issued     Unit ABO Type O     Unit Rh Type POS     Unit Product Code RL1     Unit Number [id number]     Unit Status issued     Unit ISBT Product Code E0336V00     Unit Blood Type 5100     Unit Expiration Date [id number]     Red Blood Cells [id number]    ready to transfuse     Unit ABO Type O     Unit Rh Type POS     Unit Product Code RL1     Unit Number [id number]     Unit Status ready to transfuse     Unit ISBT Product Code E0336V00     Unit Blood Type 5100     Unit Expiration Date [id number]    Type and Screen    Collection Time: 09/16/22 10:49 AM   Result Value Ref Range    ABO Group O     Rh Type POS     Antibody Screen NEG     Sample Expiration 09/19/2022 23:59      Radiographic Data/ECG:  ECG 12 lead;    Result Date: 09/16/2022  Normal sinus rhythm Nonspecific T wave  abnormality Abnormal ECG When compared with ECG of 05-Sep-2022 03:52, Nonspecific T wave abnormality now evident in Lateral leads Confirmed by LAUDER, CRAIG T. ([id number]) on 09/16/2022 5:10:08 AM    XR CHEST PA AND LATERAL    Result Date: 09/12/2022  Mild to moderate interstitial pulmonary edema with small bilateral pleural effusions and associated atelectasis.    XR Chest 2 Views    Result Date: 09/05/2022  IMPRESSION: Interstitial edema with trace effusions.    ECG 12 lead;    Result Date: 09/05/2022  Sinus rhythm with 1st degree A-V block Cannot rule out Anterior infarct , age undetermined Abnormal ECG When compared with ECG of 05-Sep-2022 03:51, MANUAL COMPARISON REQUIRED, DATA IS UNCONFIRMED No significant change Confirmed by ROSENBAUM, DAVID H. ([id number]) on 09/05/2022 4:35:35 AM    CT Abdomen and Pelvis With IV Contrast    Result Date: 09/01/2022  IMPRESSION: Redemonstrated numerous bilateral renal cysts. Redemonstrated probable hemorrhagic cyst in the lower pole of the left kidney with hemorrhage extending into the perinephric space, similar compared to prior but decreased compared to 07/02/2022. No CT evidence of active contrast extravasation. No right-sided perinephric hemorrhage.    ECG 12 lead;    Result Date: 09/01/2022  Sinus rhythm with 1st degree A-V block Otherwise normal ECG When compared with ECG of 10-Aug-2022 04:36, No significant change was found Confirmed by MITCHELL, MICHELLE ([id number]) on 09/01/2022 1:51:58 AM      I have reviewed this patient's diagnostic studies including lab data, radiographic data, ECG, medications, and hospital notes.    Hospital Problems:    End-stage renal disease on hemodialysis (CMS/HCC)    Primary hypertension    SVC syndrome    GI bleed    Assessment/Plan:  37 y.o. 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     1). ESRD / Lupus Nephritis / Prior left renal hemorrhage s/p embolization 05/07/2022  Patient currently without outpatient dialysis due to behavioral issues a various sites. Has been receiving HD in ED's (most recently 09/15/2022 at [facility]).   - Nephrology Consult placed. Currently euvolemic   - Daily Labs / Mg / Ha1c / Lipid Panel     2). Anemia of Chronic Disease / Acute anemia / Hemechezia / Know erosive gastropathy  Admission 6/16-6/19 for GIB. EGD with "moderate severe erosive gastropathy". Discharged on Protonix 40 mg daily. Has been on Eliquis which was held and restarted on 09/08/2022. Noted to have a drop in Hgb to 6.0 at ED HD at [facility] yesterday. Patient left AMA prior to transfusion. Guacic positive. States that she has been experiencing hematochezia and hematemesis.  Flexible colonoscopy 02/2022 with findings of diverticulosis without bleeding.   - Receiving 2 units of blood currently   - Currently NPO as patient states that she "cannot tolerate foods and needs Phenergan".   - GI consulted    - Protonix 40 mg BID IV   - Holding on fluids due to ESRD     3). Drug seeking behavior  Has known history of asking for dilaudid for generalized pain. Allergy list with Anaphalaxsis to nearly every other pain med. Currently asking for pain medications.  - Will give Lidocaine Patch but recommend against giving strong pain medications during this admission.     4). SVC on Eliquis   - Holding Eliquis currently. ASA was recently DC'ed      5). VSD    ECHO 03/24/2022 with LVEF of 60-65% with severely dilated RV and LA     6). Essential Hypertension   On Hydralazine 25 mg TID and Amlodipine 10 mg daily    VTE Prophylaxis: other anticoagulant    Notes:  I have reviewed/summarized previous records    Expected length of stay: Due to the patient's severity of illness and comorbidities, inpatient management is necessary and I expect the patient will require hospitalization for at least two  nights.    Inpatient billing notes:  In developing the above medical care plan, I completed a medically appropriate history and/or examination with indirect supervision of physicians who were available for collaboration but not directly involved with this encounter.     Rexford R Fisher III, PA-C    Portions of this record may have been dictated using voice recognition software.  Unintentional errors in spelling and vocabulary are possible and may sometimes remain uncorrected.

## 2022-09-16 NOTE — ED Notes (Signed)
Care Assumed.   Pt placed on Cardiac monitoring.   Bowel Prep Started.   Message Provider about pain medications, pt requesting dilaudid q8 hours.

## 2022-09-16 NOTE — Consults (Signed)
-------------------------------------------------------------------------------  Attestation signed by Tama High, MD at 09/16/2022  7:01 PM  I have seen this patient and agree with the above documentation, assessment, and plan as written by, and discussed with, Willodean Rosenthal, NP.  ESRD admitted for low Hgb and GI bleed.  Plan for HD tomorrow  -------------------------------------------------------------------------------        [facility] Nephrology Progress Note      ASSESSMENT/PLAN    Active Problems:    GI bleed      ESRD on HD  --No current outpt HD unit: discharged from [facility] in [place] 6/10- had been unable to go there due to transportation issues  --going to ED instead for her HD needs  --Last HD at [facility] 6/27, 3 hr HD and 1 hr UF  --no urgent indication for HD today, will plan for HD in AM 6/29  --2K bath   --Renal diet when advanced     HTN  --Labile  --UF on HD     Acute on chronic anemia  Anemia of CKD (Hgb Goal 10-11)  C/f GIB  --EGD 6/18 normal esophagus, moderately severe erosive gastropathy  --Hgb 6.4 in ED  --+ guaiac  --Eliquis on hold (SVC syndrome)  --GI consult pending   --ESA with HD if BP permits  --Transfusing 2 u pRBCs in ED     Secondary Hyperparathyroidism  --Phoslo with meals, last phos above target (6/17)  -- resume binders when diet advanced (renal diet)  --Ca stable    Dispo:  No renal barrier to DC    Active Orders   There are no active orders of the following types: Dialysis.        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 Patient receives HD by coming through the ED. Pt was  discharged from [facility] on June 10th. The patient states the clinic in [place] was located to far and she was having difficulty with transportation arrangements. Nephrology is consulted for dialysis needs. Last HD session was on 09/07/2022.     Interval History:  Seen in ED. Feeling fatigued. C/o R breast pain.     Review of Systems:   All other systems reviewed and were negative    OBJECTIVE    Vital signs in last 24 hours:  Temp:  [98.3 F (36.8 C)-98.5 F (36.9 C)] 98.5 F (36.9 C)  Heart Rate:  [78-90] 80  Respirations:  [15-16] 15  BP: (135-167)/(71-94) 145/78    Intake/Output last 3 shifts:  No intake/output data recorded.  Intake/Output this shift:  No intake/output data recorded.    Physical Exam:    GENERAL : Chronically ill appearing female in no acute distress.    CV: Heart rhythm regular, normal S1 and S2.  No murmur.  Chest wall with distended superficial neck veins crossing into superior chest wall. TTP.   RESP: Chest clear to auscultation bilaterally.    ABD: Soft, nontender, with normoactive bowel sounds.   EXTREMITIES:  warm and well perfused.  No peripheral edema.   NEURO: Alert and oriented  ACCESS: RUE  AVF, +t/b    MEDICATIONS  . amLODIPine  10 mg Oral Daily   . calcium acetate(phosphat bind)  1,334 mg Oral TID with meals   . hydrALAZINE  25 mg Oral TID   . [Held by provider] pantoprazole  40 mg Oral Daily   . pantoprazole  40 mg Intravenous BID       Labs:      Results from last 7 days   Lab Units 09/16/22  0429   SODIUM mmol/L 141   POTASSIUM mmol/L 4.5   CHLORIDE mmol/L 98*   CO2 mmol/L 28   BUN mg/dL 54*   CREATININE mg/dL 7.82*   CALCIUM, TOTAL mg/dL 8.6*       Results from last 7 days   Lab Units 09/16/22  0633 09/16/22  0429   WBC K/uL 3.5* See Text   RBC M/uL 2.02* See Text   HEMOGLOBIN g/dL 6.4* See Text   HEMATOCRIT % 19* See Text   MEAN CELL VOLUME fL 93 See Text   MEAN CELL HEMOGLOBIN pg 31 See Text   MEAN CELL HEMOGLOBIN CONCENTRATION g/dL 34 See Text   RDW % 95.6 See  Text   PLATELET COUNT K/uL 152 See Text   MEAN PLATELET VOLUME fL 8.0 See Text

## 2022-09-16 NOTE — Consults (Signed)
[facility]  GI Consultation Note    Date of Service:  09/16/2022  Consulting Service:  Gastroenterology  Requesting Physician:  [facility]  Primary Care Physician:  [facility]  Reason for Consult: Dark stools, possible GI bleed, nonerosive gastritis    Brief History:  This is a 37 y.o. 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  She has extensive GI history as included multiple EGDs and colonoscopies, gastric emptying study over the past several years.    Hemoglobin 6.4 on admission, had been 8.7 on 6/19.  No thrombocytopenia.  Chronic ALP elevation at 145.  HFP otherwise unremarkable.    EGD on 6/18 showed moderately severe erosive gastropathy, H. pylori negative.  We recommended PPI x 4 weeks with which patient has been compliant.  She takes daily PPI for several years.  Her last colonoscopy was in December 2023, showed diverticulosis, no other significant findings.    She reports that overall her presenting symptoms/complaints began 1 week ago.  This includes vomiting 3-4 times daily, mostly bilious, occasional small flakes of blood/"stomach lining".  She also has several episodes of loose black liquid stools daily.  She does not drink alcohol, smoke, or use illicit drugs.  She does not use NSAIDs.  No family history of GI malignancy.  She last had dialysis yesterday, completed session.    Impression/Recommendations:  37 y.o. female with history of ESRD on dialysis, who presents to the hospital with right-sided body pain including her abdomen, also reporting weeklong history of multiple melanotic stools and vomiting that includes hematemesis.  Hemoglobin noted to be 6.4, GI consulted for further evaluation and recommendations.  Patient has had extensive endoscopic workup thus far for similar issues, most recent EGD was last week, last  colonoscopy was 6 months ago.  No obvious sources of bleeding identified on either.  At this point, recommend video capsule endoscopy for further evaluation given the elusive nature of this bleeding.    -VCE tomorrow  -Continue to monitor H&H and for signs of bleeding, transfuse as needed  -Twice daily PPI  -Further supportive care and medical management per IM    Thank you for the opportunity to participate in the care of this patient.  We will continue to follow with you.      Hospital Problems:    End-stage renal disease on hemodialysis (CMS/HCC)    Primary hypertension    SVC syndrome    GI bleed      Past Medical History:   Diagnosis Date   . Asthma    . Dialysis patient (CMS/HCC)    . Hypertension    . Hypoglycemia 04/22/2020   . Lupus (CMS/HCC)    . Renal disorder    . Renal hemorrhage, left 07/03/2022   . Superior vena cava syndrome    . VSD (ventricular septal defect and aortic arch hypoplasia      Past Surgical History:   Procedure Laterality Date   . hematoma removal     . INTERVENTIONAL RADIOLOGY VASCULAR  04/25/2019   . removal chest port       Prior to Admission medications        albuterol-ipratropium (COMBIVENT) 18-103 mcg/actuation inhaler Inhale 2 puffs every 4 (four) hours as needed for wheezing or shortness of breath.   amLODIPine (NORVASC) 10 MG tablet Take 1 tablet (10 mg total)  by mouth daily.   apixaban (ELIQUIS) 2.5 mg Tab tablet Take 1 tablet (2.5 mg total) by mouth 2 (two) times a day.   calcium acetate,phosphat bind, (PHOSLO) 667 mg capsule Take 2 capsules (1,334 mg total) by mouth 3 (three) times a day with meals.   hydrALAZINE (APRESOLINE) 25 MG tablet Take 1 tablet (25 mg total) by mouth 3 (three) times a day.   metoprolol succinate (TOPROL-XL) 25 MG 24 hr tablet Take 1 tablet (25 mg total) by mouth daily.   pantoprazole (PROTONIX) 40 MG tablet Take 1 tablet by mouth daily for 30 days.     Current Medications:  . amLODIPine  10 mg Oral Daily   . calcium acetate(phosphat bind)  1,334 mg  Oral TID with meals   . hydrALAZINE  25 mg Oral TID   . lidocaine  1 patch Transdermal Daily   . [Held by provider] pantoprazole  40 mg Oral Daily   . pantoprazole  40 mg Intravenous BID       PRN Medications: melatonin, polyethylene glycol, promethazine    Allergies:   Allergies   Allergen Reactions   . Ace Inhibitors Anaphylaxis   . Acetaminophen Anaphylaxis   . Enalapril Maleate Anaphylaxis   . Fentanyl Anaphylaxis     Tolerates dilaudid     . Hydrocodone Anaphylaxis     Pt has had morphine in the past, states it makes her nauseous   . Oxycodone Anaphylaxis   . Oxycodone-Acetaminophen Anaphylaxis   . Sulfa (Sulfonamide Antibiotics) Anaphylaxis and Other (See Comments)     Other reaction(s): Anaphylaxis   . Sulfamethoxazole-Trimethoprim Anaphylaxis   . Tylenol-Codeine Solution Anaphylaxis   . Nsaids (Non-Steroidal Anti-Inflammatory Drug) Rash     Lupus patient; states can take phenergan  Not an actual allergy, pt cannot take as a dialysis pt       Social History:  The patient  reports that she has never smoked. She has never used smokeless tobacco. She reports that she does not drink alcohol and does not use drugs.    Family History:  family history includes Diabetes in her father and mother.    Review of Systems:  All other systems were reviewed and found to be negative except as noted above in HPI    Physical Examination:  VITAL SIGNS: BP 154/83   Pulse 78   Temp 98.6 F (37 C) (Oral)   Resp 16   LMP 08/24/2022   SpO2 100%      CONSTITUTIONAL: No acute distress  HEENT: Extraocular movements intact. Conjunctivae clear  NECK: Neck supple, no obvious masses or thyromegaly.  CARDIOVASCULAR: Heart rhythm regular. No jugular venous distention.  RESPIRATORY: Chest clear to auscultation bilaterally, normal work of breathing.  ABDOMEN: Soft, no distention, diffuse tenderness  MUSCULOSKELETAL: Extremities warm and well perfused, no tenderness, normal muscle tone.   SKIN: No apparent skin rashes or  lesions.  NEUROLOGIC: No focal findings. Mental status alert and oriented.  PSYCHIATRIC: Mood and affect normal.  Patient appears irritated throughout interview    Laboratory Data:    Recent Results (from the past 24 hour(s))   CBC with Platelet and Differential    Collection Time: 09/16/22  4:29 AM   Result Value Ref Range    Differential Percent Diff %     Differential Absolute Diff Absolute     WBC See Text 3.6 - 11.2 K/uL    RBC See Text 3.63 - 4.92 M/uL    Hemoglobin See Text 10.9 - 14.3  g/dL    Hematocrit See Text 31 - 42 %    Mean Cell Volume See Text 74 - 96 fL    Mean Cell Hemoglobin See Text 24 - 33 pg    Mean Cell Hemoglobin Concentration See Text 33 - 36 g/dL    RDW See Text 29.5 - 17.0 %    Platelet Count See Text 150 - 450 K/uL    Mean Platelet Volume See Text 7.5 - 11.2 fL    Neutrophils See Text 43 - 77 %    Lymphocytes See Text 16 - 44 %    Monocytes See Text 5 - 13 %    Eosinophils See Text 1 - 8 %    Basophils See Text 0 - 1 %    Neutrophils Absolute See Text 1.8 - 7.8 K/uL    Lymphocytes Absolute See Text 1.0 - 3.0 K/uL    Monocytes Absolute See Text 0.3 - 1.0 K/uL    Eosinophils Absolute See Text 0.0 - 0.5 K/uL    Basophils Absolute See Text 0.0 - 0.1 K/uL    Nucleated RBCS See Text /100 WBC   CMP    Collection Time: 09/16/22  4:29 AM   Result Value Ref Range    Sodium 141 136 - 145 mmol/L    Potassium 4.5 3.5 - 5.1 mmol/L    Chloride 98 (L) 99 - 108 mmol/L    CO2 28 21 - 31 mmol/L    BUN 54 (H) 7 - 25 mg/dL    Creatinine 6.21 (H) 0.51 - 1.00 mg/dL    Glucose, Random 91 70 - 199 mg/dL    Calcium, Total 8.6 (L) 8.8 - 10.6 mg/dL    Osmolality (calculated) 296 (H) 270 - 295 mOsm/kg    Anion Gap 15 (H) 3 - 11    Albumin 3.9 3.5 - 5.7 g/dL    Bilirubin, Total 0.3 0.3 - 1.0 mg/dL    Alkaline Phosphatase 145 (H) 34 - 104 IU/L    ALT 7 7 - 52 IU/L    AST 10 (L) 13 - 39 IU/L    Protein, Total 6.7 6.4 - 8.9 g/dL    Albumin/Globulin Ratio 1.4 1.2 - 2.3   Magnesium Level    Collection Time: 09/16/22  4:29  AM   Result Value Ref Range    Magnesium 1.8 1.6 - 2.7 mg/dL   eGFR (CKD-EPI)    Collection Time: 09/16/22  4:29 AM   Result Value Ref Range    eGFR 6 (A) mL/min/1.36m2   CBC with Differential    Collection Time: 09/16/22  6:33 AM   Result Value Ref Range    Differential Percent Diff %     Differential Absolute Diff Absolute     WBC 3.5 (L) 3.6 - 11.2 K/uL    RBC 2.02 (L) 3.63 - 4.92 M/uL    Hemoglobin 6.4 (L) 10.9 - 14.3 g/dL    Hematocrit 19 (L) 31 - 42 %    Mean Cell Volume 93 74 - 96 fL    Mean Cell Hemoglobin 31 24 - 33 pg    Mean Cell Hemoglobin Concentration 34 33 - 36 g/dL    RDW 30.8 65.7 - 84.6 %    Platelet Count 152 150 - 450 K/uL    Mean Platelet Volume 8.0 7.5 - 11.2 fL    Neutrophils 62 43 - 77 %    Lymphocytes 19 16 - 44 %    Monocytes 11 5 -  13 %    Eosinophils 6 1 - 8 %    Basophils 2 (H) 0 - 1 %    Neutrophils Absolute 2.1 1.8 - 7.8 K/uL    Lymphocytes Absolute 0.7 (L) 1.0 - 3.0 K/uL    Monocytes Absolute 0.4 0.3 - 1.0 K/uL    Eosinophils Absolute 0.2 0.0 - 0.5 K/uL    Basophils Absolute 0.1 0.0 - 0.1 K/uL    Nucleated RBCS 0 /100 WBC   Prepare RBC:# of Units: 2    Collection Time: 09/16/22 10:49 AM   Result Value Ref Range    Red Blood Cells [id number]    issued     Unit ABO Type O     Unit Rh Type POS     Unit Product Code RL1     Unit Number [id number]     Unit Status issued     Unit ISBT Product Code H0865H84     Unit Blood Type 5100     Unit Expiration Date [id number]     Red Blood Cells [id number]    ready to transfuse     Unit ABO Type O     Unit Rh Type POS     Unit Product Code RL1     Unit Number [id number]     Unit Status ready to transfuse     Unit ISBT Product Code E0336V00     Unit Blood Type 5100     Unit Expiration Date [id number]    Type and Screen    Collection Time: 09/16/22 10:49 AM   Result Value Ref Range    ABO Group O     Rh Type POS     Antibody Screen NEG     Sample Expiration 09/19/2022 23:59        I have reviewed this patient's diagnostic studies  including lab data, radiographic data, medications, hospital notes, and outside records.    Assigned Hospitalist Team:  [facility] HOSPITALIST NEW PATIENTS    [place], [place]    Staff: I saw and examined this patient. The following portions of the patient's history were reviewed and updated as appropriate: medications, allergies, past medical history, social history, surgical history, current problems. I have reviewed laboratory and radiological data. The findings, assessment and plan were discussed with PA and confirmed. I agree with the above documentation which I have edited.    37 year old female with status of end-stage renal disease on Eliquis therapy with intermittent history of melena with acute on chronic nausea.  Patient had a recent EGD evaluation which showed moderately severe erosive gastropathy.  Patient had a colonoscopy in 2023 which was remarkable for diverticulosis.    At this point to further evaluate her intermittent melena with acute on chronic anemia we will plan to pursue a capsule endoscopy to be performed tomorrow.    Portions of this record may have been dictated using voice recognition software.  Unintentional errors in spelling and vocabulary are possible and may sometimes remain uncorrected.  Inpatient billing notes:  Due to the complexity of the patient's condition, this plan of care is a collaboration between the physician and me.

## 2022-09-16 NOTE — Nursing Note (Signed)
Notified patient wants to leave AGAINST MEDICAL ADVICE.    Assessment  Patient is clinically not intoxicated, free from distracting pain, appears to have intact insight, judgment and reason and in my medical opinion has the capacity to make decisions. The patient is also not under any duress to leave the hospital. I have voiced my concerns to the patients regarding their health given complete evaluation and treatment has not occurred. I have discussed the need for continued evaluation and management of the patient's condition to avoid risks including but not limited to death, permanent disability, prolonged hospitalization and prolonged illness. I have answered all the questions about the patient's condition.  I discussed with them about return precautions including worsening of their symptoms and that they may return at any time. I emphasized to leave AGAINST MEDICAL ADVICE does not preclude returning here for further evaluation. Patient understood risks and was able to verbalize them back. Patient refused to sign AMA forms before leaving.    Nelida Meuse, NP

## 2022-09-16 NOTE — ED Notes (Signed)
Provider at bedside.  Refused to Sign AMA form.   Refused DC vitals.   Ambualted steadily out of ED.

## 2022-09-16 NOTE — Discharge Summary (Signed)
Discharge Summary    Date of Admission: 09/16/2022 Name:  Meredith Hawkins   Date of Discharge:  09/16/2022 DOB:  July 06, 1985  Discharging Team:  Hospitalist Medicine MRN:  [id number]  Consultants:  Gastroenterology, Nephrology  Primary Care Physician: [facility]    Discharge Diagnoses:     End-stage renal disease on hemodialysis (CMS/HCC)    Primary hypertension    SVC syndrome    GI bleed          Discharge Medications        .      albuterol-ipratropium 18-103 mcg/actuation inhaler  Inhale 2 puffs every 4 (four) hours as needed for wheezing or shortness of breath.  Commonly known as: COMBIVENT     amLODIPine 10 MG tablet  Take 1 tablet (10 mg total) by mouth daily.  Commonly known as: NORVASC     calcium acetate(phosphat bind) 667 mg capsule  Take 2 capsules (1,334 mg total) by mouth 3 (three) times a day with meals.  Commonly known as: PHOSLO     Eliquis 2.5 mg Tab tablet  Take 1 tablet (2.5 mg total) by mouth 2 (two) times a day.  Generic drug: apixaban     hydrALAZINE 25 MG tablet  Take 1 tablet (25 mg total) by mouth 3 (three) times a day.  Commonly known as: APRESOLINE     metoprolol succinate 25 MG 24 hr tablet  Take 1 tablet (25 mg total) by mouth daily.  Commonly known as: TOPROL-XL     pantoprazole 40 MG tablet  Take 1 tablet by mouth daily for 30 days.  Commonly known as: PROTONIX            Other Discharge Instructions:   Disposition:  Left AMA  Follow Up:  As needed  Activity:  As tolerated  Diet:  As tolerated    Results:    Last Weight:     Lab Results   Component Value Date    WBC 3.5 (L) 09/16/2022    HGB 6.4 (L) 09/16/2022    HCT 19 (L) 09/16/2022    PLT 152 09/16/2022    INR 1.33 09/01/2022    NA 141 09/16/2022    K 4.5 09/16/2022    CL 98 (L) 09/16/2022    CO2 28 09/16/2022    BUN 54 (H) 09/16/2022    CREAT 8.10 (H) 09/16/2022    GLU 91 09/16/2022    CA 8.6 (L) 09/16/2022    MG 1.8 09/16/2022    PHOS 5.8 (H) 09/05/2022    ALB 3.9 09/16/2022    TP 6.7 09/16/2022    ALT 7  09/16/2022    AST 10 (L) 09/16/2022    BILIT 0.3 09/16/2022    LIPAS 63 09/05/2022    TNIHS 22 (H) 09/05/2022     Unresulted Labs       None          Radiology:  ECG 12 lead;    Result Date: 09/16/2022  Normal sinus rhythm Nonspecific T wave abnormality Abnormal ECG When compared with ECG of 05-Sep-2022 03:52, Nonspecific T wave abnormality now evident in Lateral leads Confirmed by LAUDER, CRAIG T. ([id number]) on 09/16/2022 5:10:08 AM    XR CHEST PA AND LATERAL    Result Date: 09/12/2022  Mild to moderate interstitial pulmonary edema with small bilateral pleural effusions and associated atelectasis.    XR Chest 2 Views    Result Date: 09/05/2022  IMPRESSION: Interstitial edema with trace effusions.  ECG 12 lead;    Result Date: 09/05/2022  Sinus rhythm with 1st degree A-V block Cannot rule out Anterior infarct , age undetermined Abnormal ECG When compared with ECG of 05-Sep-2022 03:51, MANUAL COMPARISON REQUIRED, DATA IS UNCONFIRMED No significant change Confirmed by ROSENBAUM, DAVID H. ([id number]) on 09/05/2022 4:35:35 AM    CT Abdomen and Pelvis With IV Contrast    Result Date: 09/01/2022  IMPRESSION: Redemonstrated numerous bilateral renal cysts. Redemonstrated probable hemorrhagic cyst in the lower pole of the left kidney with hemorrhage extending into the perinephric space, similar compared to prior but decreased compared to 07/02/2022. No CT evidence of active contrast extravasation. No right-sided perinephric hemorrhage.    ECG 12 lead;    Result Date: 09/01/2022  Sinus rhythm with 1st degree A-V block Otherwise normal ECG When compared with ECG of 10-Aug-2022 04:36, No significant change was found Confirmed by MITCHELL, MICHELLE ([id number]) on 09/01/2022 1:51:58 AM      History of Present Illness:  Please see admission note for full details regarding the patient's presentation.  In brief, this 37 y.o. female with a history of ESRD secondary to Lupus Nephritis w/ prior renal hemorrhage (06/2022) s/p embolization, SVC syndrome with  stent on Eliquis, VSD, and Essential Hypertension who presented with complaints of right sided flank pain and swelling/pain to the right breast.  In our ED today, pt is guaiac positive with a Hgb 6.4.     Patient with frequent visits to the ED (> 10 in the last month) for chronic complaints. Most recently seen in [facility] ED yesterday.     Hospital Course:  Transfused 2 units of blood here per nephrology recommendations. Seen by GI who recommended VCE tomorrow, trend H&H, PPI BID. Nephrology saw pt and plan was for HD tomorrow.     2320: Pt now refusing to stay to complete w/u and treatment. Pt sts that the bowel prep is "disgusting" and she refuses to continue it. She is also specifically requesting dilaudid for chronic pain. Multiple providers have informed her that that is not appropriate for her chronic pain or generalized pain and have offered alternatives. Pt wants to leave AMA.        The patient was instructed on indications for seeking medical attention. Patient is clinically not intoxicated, appears to have intact insight, judgment and reason and in my medical opinion has the capacity to make decisions. The patient is also not under any duress to leave the hospital. I have voiced my concerns to the patients regarding their health given complete evaluation and treatment has not occurred. I have discussed the need for continued evaluation and management of the patient's condition to avoid risks including but not limited to death, permanent disability, prolonged hospitalization and prolonged illness. I have answered all the questions about the patient's condition.  I discussed with them about return precautions including worsening of their symptoms and that they may return at any time. I emphasized to leave AGAINST MEDICAL ADVICE does not preclude returning here for further evaluation. Patient understood risks and was able to verbalize them back. Patient refused to sign AMA forms before leaving. I spent less than  30 minutes in the discharge of this patient.      Nelida Meuse, NP

## 2022-09-16 NOTE — ED Notes (Signed)
Critical Care    Performed by: Clearence Ped, PA-C  Authorized by: Vanessa Ralphs, MD    Critical care provider statement:     Critical care time (minutes):  35    Critical care time was exclusive of:  Separately billable procedures and treating other patients and teaching time    Critical care was necessary to treat or prevent imminent or life-threatening deterioration of the following conditions:  CNS failure or compromise and cardiac failure    Critical care was time spent personally by me on the following activities:  Discussions with consultants, development of treatment plan with patient or surrogate, evaluation of patient's response to treatment, examination of patient, obtaining history from patient or surrogate, ordering and performing treatments and interventions, ordering and review of laboratory studies, re-evaluation of patient's condition and pulse oximetry    I assumed direction of critical care for this patient from another provider in my specialty: no      Care discussed with: admitting provider

## 2022-09-16 NOTE — ED Notes (Signed)
Pt demanded her IV be removed.  Pt states she is leaving and took herself off the monitor.   Explained to pt that the provider is coming to speak with her.   Pt tells RN to shut the door and turn off the lights.

## 2022-09-23 ENCOUNTER — Emergency Department: Admit: 2022-09-23 | Payer: PRIVATE HEALTH INSURANCE

## 2022-09-23 ENCOUNTER — Inpatient Hospital Stay
Admit: 2022-09-23 | Discharge: 2022-09-24 | Disposition: A | Discharge status: Other Acute Inpatient Hospital | Payer: PRIVATE HEALTH INSURANCE | Attending: Emergency Medicine

## 2022-09-23 DIAGNOSIS — L03114 Cellulitis of left upper limb: Secondary | ICD-10-CM

## 2022-09-23 LAB — CBC WITH AUTO DIFFERENTIAL
Basophils %: 0 % (ref 0–1)
Basophils Absolute: 0 10*3/uL (ref 0.0–0.1)
Eosinophils %: 7 % (ref 0–7)
Eosinophils Absolute: 0.4 10*3/uL (ref 0.0–0.4)
Hematocrit: 23.6 % — ABNORMAL LOW (ref 35.0–47.0)
Hemoglobin: 7.9 g/dL — ABNORMAL LOW (ref 11.5–16.0)
Immature Granulocytes %: 1 % — ABNORMAL HIGH (ref 0.0–0.5)
Immature Granulocytes Absolute: 0 10*3/uL (ref 0.00–0.04)
Lymphocytes %: 4 % — ABNORMAL LOW (ref 12–49)
Lymphocytes Absolute: 0.3 10*3/uL — ABNORMAL LOW (ref 0.8–3.5)
MCH: 30.4 PG (ref 26.0–34.0)
MCHC: 33.5 g/dL (ref 30.0–36.5)
MCV: 90.8 FL (ref 80.0–99.0)
MPV: 10.4 FL (ref 8.9–12.9)
Monocytes %: 5 % (ref 5–13)
Monocytes Absolute: 0.3 10*3/uL (ref 0.0–1.0)
Neutrophils %: 84 % — ABNORMAL HIGH (ref 32–75)
Neutrophils Absolute: 5.2 10*3/uL (ref 1.8–8.0)
Nucleated RBCs: 0 PER 100 WBC
Platelets: 179 10*3/uL (ref 150–400)
RBC: 2.6 M/uL — ABNORMAL LOW (ref 3.80–5.20)
RDW: 14.8 % — ABNORMAL HIGH (ref 11.5–14.5)
WBC: 6.2 10*3/uL (ref 3.6–11.0)
nRBC: 0 10*3/uL (ref 0.00–0.01)

## 2022-09-23 LAB — COMPREHENSIVE METABOLIC PANEL
ALT: 9 U/L — ABNORMAL LOW (ref 12–78)
AST: 10 U/L — ABNORMAL LOW (ref 15–37)
Albumin/Globulin Ratio: 0.8 — ABNORMAL LOW (ref 1.1–2.2)
Albumin: 3 g/dL — ABNORMAL LOW (ref 3.5–5.0)
Alk Phosphatase: 135 U/L — ABNORMAL HIGH (ref 45–117)
Anion Gap: 13 mmol/L (ref 5–15)
BUN/Creatinine Ratio: 5 — ABNORMAL LOW (ref 12–20)
BUN: 62 MG/DL — ABNORMAL HIGH (ref 6–20)
CO2: 23 mmol/L (ref 21–32)
Calcium: 8.3 MG/DL — ABNORMAL LOW (ref 8.5–10.1)
Chloride: 89 mmol/L — ABNORMAL LOW (ref 97–108)
Creatinine: 11.98 MG/DL — ABNORMAL HIGH (ref 0.55–1.02)
Est, Glom Filt Rate: 4 mL/min/{1.73_m2} — ABNORMAL LOW (ref >60)
Globulin: 3.7 g/dL (ref 2.0–4.0)
Glucose: 96 mg/dL (ref 65–100)
Potassium: 4.3 mmol/L (ref 3.5–5.1)
Sodium: 125 mmol/L — ABNORMAL LOW (ref 136–145)
Total Bilirubin: 0.2 MG/DL (ref 0.2–1.0)
Total Protein: 6.7 g/dL (ref 6.4–8.2)

## 2022-09-23 LAB — PROCALCITONIN: Procalcitonin: 1.7 ng/mL

## 2022-09-23 LAB — TROPONIN: Troponin, High Sensitivity: 19 ng/L (ref 0–51)

## 2022-09-23 MED ORDER — HYDROMORPHONE HCL PF 1 MG/ML IJ SOLN
1 | INTRAMUSCULAR | Status: AC
Start: 2022-09-23 — End: 2022-09-23
  Administered 2022-09-23: 17:00:00 1 mg via INTRAVENOUS

## 2022-09-23 MED ORDER — DIPHENHYDRAMINE HCL 50 MG/ML IJ SOLN
50 | INTRAMUSCULAR | Status: AC
Start: 2022-09-23 — End: 2022-09-23
  Administered 2022-09-23: 17:00:00 25 mg via INTRAVENOUS

## 2022-09-23 MED ORDER — PROCHLORPERAZINE EDISYLATE 10 MG/2ML IJ SOLN
10 | Freq: Four times a day (QID) | INTRAMUSCULAR | Status: DC | PRN
Start: 2022-09-23 — End: 2022-09-23

## 2022-09-23 MED ORDER — PANTOPRAZOLE SODIUM 40 MG IV SOLR
40 | Freq: Once | INTRAVENOUS | Status: AC
Start: 2022-09-23 — End: 2022-09-23
  Administered 2022-09-23: 18:00:00 40 mg via INTRAVENOUS

## 2022-09-23 MED ORDER — HYDROMORPHONE HCL PF 1 MG/ML IJ SOLN
1 | INTRAMUSCULAR | Status: AC
Start: 2022-09-23 — End: 2022-09-23
  Administered 2022-09-23: 23:00:00 0.5 mg via INTRAVENOUS

## 2022-09-23 MED ORDER — CEFTRIAXONE SODIUM 1 G IJ SOLR
1 | INTRAMUSCULAR | Status: AC
Start: 2022-09-23 — End: 2022-09-23
  Administered 2022-09-23: 21:00:00 1000 mg via INTRAVENOUS

## 2022-09-23 MED ORDER — VIAL-MATE ADAPTOR
1 g | Freq: Once | INTRAVENOUS | Status: AC
Start: 2022-09-23 — End: 2022-09-23
  Administered 2022-09-23: 23:00:00 1000 mg via INTRAVENOUS

## 2022-09-23 MED ORDER — VIAL-MATE ADAPTOR
0.9 % | Freq: Once | INTRAVENOUS | Status: AC
Start: 2022-09-23 — End: 2022-09-23
  Administered 2022-09-23: 23:00:00 1000 mg via INTRAVENOUS

## 2022-09-23 MED ORDER — VANCOMYCIN 1500 MG IN NS 250 ML (PREMIX) IVPB
Freq: Once | Status: DC
Start: 2022-09-23 — End: 2022-09-23

## 2022-09-23 MED ORDER — ONDANSETRON HCL 4 MG/2ML IJ SOLN
4 | Freq: Four times a day (QID) | INTRAMUSCULAR | Status: DC | PRN
Start: 2022-09-23 — End: 2022-09-23
  Administered 2022-09-23 (×2): 4 mg via INTRAVENOUS

## 2022-09-23 MED ORDER — HYDROMORPHONE HCL PF 1 MG/ML IJ SOLN
1 | INTRAMUSCULAR | Status: AC
Start: 2022-09-23 — End: 2022-09-23
  Administered 2022-09-23: 19:00:00 1 mg via INTRAVENOUS

## 2022-09-23 MED FILL — CEFTRIAXONE SODIUM 1 G IJ SOLR: 1 g | INTRAMUSCULAR | Qty: 1000

## 2022-09-23 MED FILL — PANTOPRAZOLE SODIUM 40 MG IV SOLR: 40 MG | INTRAVENOUS | Qty: 40

## 2022-09-23 MED FILL — VIAL-MATE ADAPTOR: Qty: 1

## 2022-09-23 MED FILL — ONDANSETRON HCL 4 MG/2ML IJ SOLN: 4 MG/2ML | INTRAMUSCULAR | Qty: 2

## 2022-09-23 MED FILL — VANCOMYCIN HCL 1 G IV SOLR: 1 g | INTRAVENOUS | Qty: 1000

## 2022-09-23 MED FILL — SODIUM CHLORIDE 0.9 % IV SOLN: 0.9 % | INTRAVENOUS | Qty: 250

## 2022-09-23 MED FILL — HYDROMORPHONE HCL 1 MG/ML IJ SOLN: 1 MG/ML | INTRAMUSCULAR | Qty: 1

## 2022-09-23 MED FILL — DIPHENHYDRAMINE HCL 50 MG/ML IJ SOLN: 50 MG/ML | INTRAMUSCULAR | Qty: 1

## 2022-09-23 NOTE — Progress Notes (Signed)
[facility] Services: Vancomycin Note  Indication: skin and soft tissue infection; cellulitis of left upper extremity  Day of Therapy: 1    Ht Readings from Last 1 Encounters:   09/23/22 1.676 m (5\' 6" )     Wt Readings from Last 1 Encounters:   09/23/22 78 kg (172 lb)       Vancomycin:  Loading dose: vancomycin 2000 mg IV x 1     Non-Kinetic Antimicrobial Dosing Regimen:   Ceftriaxone 1 g IV x 1      Cultures 7/5 blood cx: NG < 24 hours  7/5 blood cx: NG < 24 hours   Serum Creatinine Lab Results   Component Value Date/Time    BUN 62 09/23/2022 12:51 PM    CREATININE 11.98 09/23/2022 12:51 PM      Creatinine Clearance Estimated Creatinine Clearance: 7 mL/min (A) (based on SCr of 11.98 mg/dL (H)).   Temp Temp: 98.1 F (36.7 C) (Oral)     WBC Lab Results   Component Value Date/Time    WBC 6.2 09/23/2022 12:51 PM      Procalcitonin  Lab Results   Component Value Date/Time    PROCAL 1.70 09/23/2022 12:51 PM        Thank you,     Octaviano Batty, RPH  [phone]

## 2022-09-23 NOTE — H&P (Signed)
History and Physical    Date of Service:  09/23/2022  Primary Care Provider: No primary care provider on file.  Source of information: Patient and review of ED and electronic medical records    Chief Complaint: pain and swelling of neck, right breast/ chest, right axilla, right upper arm, left upper arm, drainage from right upper arm wound, abdominal pain, nausea and vomiting      History of Presenting Illness:   Meredith Hawkins is a 37 y.o. 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  Patient noted onset of symptoms starting about 1 week ago.  Symptoms notably have remained constant, severe, aching, aggravated with touch, without alleviating factors.  Patient is resident of [place], [place] but she is visiting here in [place].  Per chart review, patient was recent hospitalized at [facility] on 09/16/2022 complaining of right-sided flank, and swelling and pain of right breast, anemia (transfused 2 units of blood with hemoglobin 6.4); was seen by GI with plan for video capsule endoscopy but patient left AMA.  Over the past year, she has had multiple hospitalizations but each discharge record notes Eliquis on discharge medication list.  There have been no other further reports of GI bleed.  Patient does not complain of any hematemesis, hematochezia, or melena or other bleeding over the past week.  She  presented to the ER today with the aforementioned complaints.  She reportedly was last dialyzed on 09/20/2022 in the ED at outside hospital in [place].  She notes that she does not go to a dialysis center for hemodialysis due to "personal reasons".  Patient went to [facility] ED today where workup included labs showing hemoglobin 7.9, neutrophils 84%, sodium 125, BUN 62, creatinine 11.98, GFR 4, calcium 8.3, albumin 3.0, alkaline phosphatase 135.  12-lead EKG shows sinus rhythm, first-degree AV block at 72 beats minute.  ED ordered Dilaudid 1 mg IV x 2 + 0.5 mg IV x 1, Benadryl 25 mg IV x 1, Protonix 40 mg IV x 1, ceftriaxone 1000 mg IV x 1, and vancomycin 1000 mg IV x 1 dose.  ED performed limited bedside ultrasound of soft tissues left upper extremity showing diffuse cobblestoning consistent with cellulitis but no fluid collection consistent with abscess.  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  ED requested transfer to [facility].     REVIEW OF SYSTEMS:  Pertinent items are noted in the History of Present Illness.     Past Medical History:   Diagnosis Date    Asthma     Lupus (HCC)     VSD (ventricular septal defect)       No past surgical history on file.  Prior to Admission medications    Medication Sig Start Date End Date Taking? Authorizing Provider   hydrALAZINE (APRESOLINE) 25 MG tablet Take 1 tablet by mouth 3 times daily 08/03/21 05/27/23 Yes [provider]   metoprolol succinate (TOPROL XL) 25 MG extended release tablet Take 1 tablet by  mouth daily 06/11/21 05/27/23 Yes [provider]   pantoprazole (PROTONIX) 40 MG tablet Take 1 tablet by mouth daily 03/26/22 10/07/22 Yes [provider]   amLODIPine (NORVASC) 10 MG tablet Take 1 tablet by mouth daily    [provider]   calcium acetate (PHOSLO) 667 MG CAPS capsule Take 1 capsule by mouth 3 times daily (with meals)    [provider]   apixaban (ELIQUIS) 2.5 MG TABS tablet Take 1 tablet by mouth 2 times daily    [provider]     Allergies   Allergen Reactions    Ace Inhibitors Anaphylaxis     Pt states "all of these have closed my throat"    Acetaminophen Anaphylaxis     Pt states "all of these have closed my throat"    Codeine Anaphylaxis     Pt states "all of these have closed my throat"    Fentanyl Anaphylaxis    Hydrocodone Anaphylaxis     Pt states "all of these have closed my throat"    Nsaids Other (See Comments)     pcp states not to take due to kidneys     Oxycodone Anaphylaxis     Pt states "all of these have closed my throat"    Sulfa Antibiotics Anaphylaxis      Family history:  Stroke                          grandfather    Social History:    Smoking: Reportedly never smoked cigarettes  Alcohol: Reportedly does not drink alcohol  History drugs: Reported does not use illicit drugs      Social Determinants of Health     Tobacco Use: Low Risk  (05/07/2022)    Patient History     Smoking Tobacco Use: Never     Smokeless Tobacco Use: Never     Passive Exposure: Not on file   Alcohol Use: Not At Risk (09/23/2022)    AUDIT-C     Frequency of Alcohol Consumption: Never     Average Number of Drinks: Patient does not drink     Frequency of Binge Drinking: Never   Physicist, medical Strain: Not on file   Food Insecurity: Not on file   Transportation Needs: Not on file   Physical Activity: Not on file   Stress: Not on file   Social Connections: Moderately Integrated (09/23/2022)    Social Connections [facility])     If for any reason you need help with day-to-day activities such as bathing, preparing meals, shopping, managing finances, etc., do you get the help you need?: Not on file   Intimate Partner Violence: Not on file   Depression: Not on file   Housing Stability: Not on file   Interpersonal Safety: Not At Risk (09/23/2022)    Interpersonal Safety Domain Source: IP Abuse Screening     Physical abuse: Denies     Verbal abuse: Denies      Emotional abuse: Denies     Financial abuse: Denies     Sexual abuse: Denies   Utilities: Not on file        Medications were reconciled to the best of my ability given all available resources at the time of admission. Route is PO if not otherwise noted.     Family and social history were personally reviewed, all pertinent and relevant details are outlined as above.    Objective:  BP (!) 158/99   Pulse 70   Temp 98.8 F (37.1 C) (Oral)   Resp 18   SpO2 94%         PHYSICAL EXAM:   EXAM WAS PERFORMED WITH NURSE CHAPERON PRESENT.    General: Patient in no acute respiratory distress.  HEENT: Normocephalic, atraumatic, no otorrhea, no rhinorrhea, nares patent, PEERL, EOMI, moist mucus membranes.  Tongue midline/nonedematous.  Neck: Supple, no JVD, no meningeal signs.  No carotid bruits.  Trachea midline.  Neck swelling (edema) with hypervascular nonpulsatile chronic appearance across anterior neck and supraclavicular region  Respiratory/ Chest: Clear to auscultation bilaterally   Breast: Diffuse markedly edematous p'eau d'orange appearance with induration and tenderness of right breast and right axilla  CVS: Regular rate.  Regular rhythm.  Normal S1-S2.  Pansystolic blowing murmur  GI/ Abd: Soft.  Obese.  Nondistended.  Generalized tenderness to light palpation.  Voluntary guarding.  No rebound or rigidity.  Normoactive bowel sounds.  No auscultated abdominal BUEs or palpable abdominal mass.  Musculoskeletal/Ext: Right arm AV fistula with palpable thrill.  Non-pitting edema of BUE.  No acute bony/soft tissue deformity.  Chronic joint deformity with external rotation of both feet with large bunions.  Neuro: GCS 15.  Moves all extremities x 4.  Generalized weakness strength 4 5 bilateral upper/lower extremities.  No slurred speech.  No facial droop.  Sensation grossly intact.    Psych: A&Ox4.  Slightly anxious.  Cap refill: Brisk, less than 3 seconds  Vascular/ Pulses: 2+ radial, 1+ dorsalis pedis pulse  bilateral.  Integument/ Skin: Warm, dry.  Hard dry calluses present plantar surface of both feet.  Edema & induration skin right chest/breast, and bilateral upper extremities.    Data Review:   I have independently reviewed and interpreted patient's lab and all other diagnostic data   Latest Reference Range & Units 09/23/22 12:51   Sodium 136 - 145 mmol/L 125 (L)   Potassium 3.5 - 5.1 mmol/L 4.3   Chloride 97 - 108 mmol/L 89 (L)   CARBON DIOXIDE 21 - 32 mmol/L 23   BUN,BUNPL 6 - 20 MG/DL 62 (H)   Creatinine 2.95 - 1.02 MG/DL 62.13 (H)   Bun/Cre 12 - 20   5 (L)   Anion Gap 5 - 15 mmol/L 13   Est, Glom Filt Rate >60 ml/min/1.73m2 4 (L)   Glucose 65 - 100 mg/dL 96   Calcium 8.5 - 08.6 MG/DL 8.3 (L)   Albumin/Globulin Ratio 1.1 - 2.2   0.8 (L)   Total Protein 6.4 - 8.2 g/dL 6.7   Procalcitonin ng/mL 1.70   Troponin, High Sensitivity 0 - 51 ng/L 19   Albumin 3.5 - 5.0 g/dL 3.0 (L)   Globulin 2.0 - 4.0 g/dL 3.7   Alkaline Phosphatase 45 - 117 U/L 135 (H)   ALT 12 - 78 U/L 9 (L)   AST 15 - 37 U/L 10 (L)   Total Bilirubin 0.2 - 1.0 MG/DL 0.2   (L): Data is abnormally low  (H): Data is abnormally high   Latest Reference Range & Units 09/23/22 12:51   WBC 3.6 - 11.0 K/uL 6.2   RBC 3.80 - 5.20 M/uL 2.60 (L)   Hemoglobin Quant 11.5 - 16.0 g/dL 7.9 (L)   Hematocrit 57.8 - 47.0 % 23.6 (L)   MCV 80.0 - 99.0 FL 90.8   MCH 26.0 - 34.0 PG 30.4   MCHC 30.0 - 36.5 g/dL 46.9   MPV 8.9 - 62.9 FL 10.4  RDW 11.5 - 14.5 % 14.8 (H)   Platelet Count 150 - 400 K/uL 179   Neutrophils % 32 - 75 % 84 (H)   Lymphocyte % 12 - 49 % 4 (L)   Monocytes % 5 - 13 % 5   Eosinophils % 0 - 7 % 7   Basophils % 0 - 1 % 0   Neutrophils Absolute 1.8 - 8.0 K/UL 5.2   Lymphocytes Absolute 0.8 - 3.5 K/UL 0.3 (L)   Monocytes Absolute 0.0 - 1.0 K/UL 0.3   Eosinophils Absolute 0.0 - 0.4 K/UL 0.4   Basophils Absolute 0.0 - 0.1 K/UL 0.0   Differential Type -   AUTOMATED   Immature Granulocytes % 0.0 - 0.5 % 1 (H)   Nucleated Red Blood Cells 0 PER 100 WBC  0.00 - 0.01  K/uL 0.0  0.00   Immature Granulocytes Absolute 0.00 - 0.04 K/UL 0.0   (L): Data is abnormally low  (H): Data is abnormally high  @MICRORESULTS @    IMAGING:   No orders to display        ECG/ECHO:  @LASTCARDIOLOGY @       Notes reviewed from all clinical/nonclinical/nursing services involved in patient's clinical care. Care coordination discussions were held with appropriate clinical/nonclinical/ nursing providers based on care coordination needs.     Assessment:   Given the patient's current clinical presentation, there is a high level of concern for decompensation if discharged from the emergency department. Complex decision making was performed, which includes reviewing the patient's available past medical records, laboratory results, and imaging studies.    Principal Problem:    Cellulitis of left upper extremity  Resolved Problems:    * No resolved hospital problems. *      Plan:     Cellulitis of left upper extremity  -possible cellulitis right breast/ RUE  -No reported evidence of abscess on bedside ultrasound of left upper extremity  -Already admitted/transferred to MedSurg floor  -Check paired blood cultures  -Continue IV antibiotic: Vancomycin (pharmacy dosing)        2.  Acute hyponatremia  -Initial serum sodium 125  -Repeat serum sodium proceed toward slow correction  -If able to make any urine, send urine sodium and osmolality    3.  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  -Consult vascular surgeon    4.  Edema of both upper extremities       Edema of neck  -plan as above  -order venous duplex of both upper extremities    5.  Anemia of chronic disease  -Hemoglobin 7.9 which is about patient's baseline but increased compared to hemoglobin 6.4  reported on recent hospitalization  -Repeat H&H  -Check iron profile, B12, folate level  -Order fecal occult blood test  -Transfuse if hemoglobin less than 7.0    6.  Generalized abdominal pain  -Received Dilaudid without allergic reaction.  May continue 1 mg IV every 4 hours as needed for severe pain    7.   ESRD (end stage renal disease)  -Reportedly does not follow-up with outpatient nephrologist or dialysis center  -Consult nephrologist (unreferred) for hemodialysis  -Last hemodialysis was on Tuesday, 09/20/2022 per patient report    DIET: ADULT DIET; Regular; Low Fat/Low  Chol/High Fiber/2 gm Na; Low Potassium (Less than 3000 mg/day)   ISOLATION PRECAUTIONS: No active isolations  CODE STATUS: Full Code   Central Line:   none  DVT PROPHYLAXIS: SCDs  FUNCTIONAL STATUS PRIOR TO HOSPITALIZATION: Fully active and ambulatory; able to carry on all self-care without restriction.  Ambulatory status/function: By self   EARLY MOBILITY ASSESSMENT: Recommend routine ambulation while hospitalized with the assistance of nursing staff  ANTICIPATED DISCHARGE: Greater than 48 hours.  ANTICIPATED DISPOSITION: Home  EMERGENCY CONTACT/SURROGATE DECISION MAKER:   Branch,Corey (Other)  [phone] (Mobile)       CRITICAL CARE WAS PERFORMED FOR THIS ENCOUNTER: NO.    ADVANCED DIRECTIVE/ CODE STATUS:  FULL CODE as per discussion with patient.   Signed By: Kara Pacer, MD     September 23, 2022         Please note that this dictation may have been completed with Reubin Milan, the computer voice recognition software.  Quite often unanticipated grammatical, syntax, homophones, and other interpretive errors are inadvertently transcribed by the computer software.  Please disregard these errors.  Please excuse any errors that have escaped final proofreading.  Dialysis center for hemodialysis due to other personal reasons.

## 2022-09-23 NOTE — ED Notes (Addendum)
1200 -   Emergency Department Nursing Plan of Care       The Nursing Plan of Care is developed from the Nursing assessment and Emergency Department Attending provider initial evaluation.  The plan of care may be reviewed in the "ED Provider note".    The Plan of Care was developed with the following considerations:   Patient / Family readiness to learn indicated [id number] to Medical chart in Epic  Persons(s) to be included in education: Refer to Medical chart in Epic  Barriers to Learning/Limitations:Normal    Signed     Lelon Perla, RN    09/23/2022   12:00    ED visit d/t (L) arm pain (swelling, redness, pain to touch, warmth to touch, drainage from the site at home - site is an old occluded AV fistula) / bilat neck pain (neck swelling) / nausea vomiting, persistent sxs at home - reports having severe pain on arrival - Denies fevers / chills - A?Ox4 at this time - speaking in full sentences at this time;; presents with (R) arm AV fistula, thrill and bruit noted - strong AROM to all extremities with (+) at this time;;    1531 - pt resting in bed - reports mild relief from IVP medications - pt with no acute distress noted at this time;; remains A/Ox4 at this time;;

## 2022-09-23 NOTE — ED Triage Notes (Addendum)
Pt presents ambulatory to the ED with multiple complaints and extensive medical hx.    Pt reports wound to the left upper arm and left arm swelling. Pt reports wound is oozing yellow fluid x 1 week.    Pt also c/o right breast pain, swelling.     Pt c/o abdominal pain n/v.     Pt is dialysis patient. Pt currently does not go to dialysis center and receives dialysis in the ED. Pt last dialysis was Tuesday in [place].

## 2022-09-23 NOTE — ED Notes (Addendum)
2100 - [facility] ALS at bedside for transfer to [facility] room 517 - pt resting in comfortably in bed with no acute distress noted at this time;; Vancomycin 2 gram in 500 ml NS bag continues to infuse at 125 ml per hr at this time - (L) forearm PIV intact and functional at this time;       NOTE, IV vancomycin continues to infuse at this - unable to discharge patient with running IV medications - a hard stop was interfering with transferring the patient;

## 2022-09-23 NOTE — ED Provider Notes (Signed)
[facility] EMERGENCY DEPT  EMERGENCY DEPARTMENT ENCOUNTER       Pt Name: Meredith Hawkins  MRN: [id number]  Birthdate April 22, 1985  Date of evaluation: 09/23/2022  Provider: Corky Crafts, MD   PCP: No primary care provider on file.  Note Started: 12:42 PM 09/23/22     CHIEF COMPLAINT       Chief Complaint   Patient presents with    Wound Infection    Emesis        HISTORY OF PRESENT ILLNESS: 1 or more elements      History From: Patient, History limited by: None     Meredith Hawkins is a 37 y.o. 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  She reports approximately 1 week of left upper arm swelling redness and pain, serous bloody discharge from a punctate area on her arm.  Associated with this she has had acute on chronic pain in her right breast, nausea and vomiting; denies fever.  She reports neck vein engorgement as well, has a history of SVC syndrome status post stent placement, reports compliance with her anticoagulants.  She reports she is dialyzed in the ER, was last dialyzed on Tuesday.  She currently lives in [place] and is visiting [place].     Nursing Notes were all reviewed and agreed with or any disagreements were addressed in the HPI.     REVIEW OF SYSTEMS        Positives and Pertinent negatives as per HPI.    PAST HISTORY     Past Medical History:  Past Medical History:   Diagnosis Date    Asthma     Lupus (HCC)     VSD (ventricular septal defect)        Past Surgical History:  No past surgical history on file.    Family History:  No family history on file.    Social History:  Social History     Tobacco Use    Smoking status: Never    Smokeless tobacco: Never   Substance Use Topics    Alcohol use: Never    Drug use: Never       Allergies:  Allergies   Allergen Reactions    Ace Inhibitors  Anaphylaxis     Pt states "all of these have closed my throat"    Acetaminophen Anaphylaxis     Pt states "all of these have closed my throat"    Codeine Anaphylaxis     Pt states "all of these have closed my throat"    Fentanyl Anaphylaxis    Hydrocodone Anaphylaxis     Pt states "all of these have closed my throat"    Nsaids Other (See Comments)     pcp states not to take due to kidneys     Oxycodone Anaphylaxis     Pt states "all of these have closed my throat"    Sulfa Antibiotics Anaphylaxis       CURRENT MEDICATIONS      Previous Medications    AMLODIPINE (NORVASC) 10 MG TABLET    Take 1 tablet by mouth daily    APIXABAN (ELIQUIS) 2.5 MG TABS TABLET    Take 1 tablet by mouth 2 times daily    CALCIUM ACETATE (PHOSLO) 667 MG CAPS CAPSULE    Take 1 capsule by mouth  3 times daily (with meals)       SCREENINGS               No data recorded         PHYSICAL EXAM      Vitals:    09/23/22 1120   BP: 129/69   Pulse: 73   Resp: 18   Temp: 99.1 F (37.3 C)   TempSrc: Oral   SpO2: 100%   Weight: 78 kg (172 lb)   Height: 1.676 m (5\' 6" )        Physical Exam  Vitals and nursing note reviewed.   Constitutional:       Appearance: Normal appearance. She is obese. She is not ill-appearing.   Cardiovascular:      Rate and Rhythm: Normal rate and regular rhythm.      Pulses: Normal pulses.      Heart sounds: Murmur (holosystolic) heard.      Comments: Right upper extremity fistula with palpable thrill  Pulmonary:      Effort: Pulmonary effort is normal.   Skin:     General: Skin is warm and dry.      Comments: Erythema, warmth, tenderness to biceps area of left upper arm. Diffuse induration, no fluctuance   Neurological:      Mental Status: She is alert.          DIAGNOSTIC RESULTS   LABS:     No results found for this or any previous visit (from the past 12 hour(s)).    EKG: If performed, independent interpretation documented below in the MDM section     RADIOLOGY:  Non-plain film images such as CT, Ultrasound and MRI are read  by the radiologist. Plain radiographic images are visualized and preliminarily interpreted by the ED Provider with the findings documented in the MDM section.     Interpretation per the Radiologist below, if available at the time of this note:     No orders to display        PROCEDURES   Unless otherwise noted below or in ED Course, none  Procedures  Procedure Note - Limited Bedside Ultrasound- Soft Tissue and Skin  Performed by: Corky Crafts, MD  Indication: Evaluation of abscess  Interpretation: The left upper extremity portion of the body was visualized using bedside US.  There are not fluid collections appreciated on exam which are  not consistent  with abscess. There is diffuse cobblestoning, consistent with cellulitis.  Foreign body(ies)  are not  appreciated.  The procedure took 1-15 minutes, and pt tolerated well.   Image(s) as below  from [facility]      CRITICAL CARE TIME       EMERGENCY DEPARTMENT COURSE and DIFFERENTIAL DIAGNOSIS/MDM   Vitals:    Vitals:    09/23/22 1120   BP: 129/69   Pulse: 73   Resp: 18   Temp: 99.1 F (37.3 C)   TempSrc: Oral   SpO2: 100%   Weight: 78 kg (172 lb)   Height: 1.676 m (5\' 6" )        Patient was given the following medications:  Medications   HYDROmorphone HCl PF (DILAUDID) injection 1 mg (has no administration in time range)   ondansetron (ZOFRAN) injection 4 mg (has no administration in time range)     Medical Decision Making  37 year old with complex medical history including SVC syndrome status post stenting, prior left upper extremity fistula presenting with warmth swelling redness overlying left upper extremity concerning  for cellulitis    She does have neck and vein engorgement on exam concerning for recurrent SVC syndrome.  On chart review it appears this is a relatively chronic condition for the patient, last imaging of the stents was performed late May.  Will await CMP results prior to contrast administration given that she does make urine.  Obtain EKG to assess for  hyperkalemic changes as well as for ACS changes in setting of her dyspnea, troponin for the same.  Chest x-ray given missed dialysis; multiple medication allergies will give IV Dilaudid.  Procalcitonin blood cultures; no SIRS criteria met on arrival, no signs of sepsis at this time.  Disposition is pending workup, imaging and response to treatment    Amount and/or Complexity of Data Reviewed  External Data Reviewed: radiology and notes.     Details: On chart review patient was admitted at [facility] 6/28.  Ultimate left AMA after being refused Dilaudid for her chronic pain.  On 6/13 CT imaging of the abdomen showed no active extravasation, probable hemorrhagic cyst in the lower pole of the left kidney extending into the perinephric space.    She was seen at [facility] 6/27, dialyzed.  CT chest reviewed from 5/22 showed a patent SVC with a right brachiocephalic and proximal subclavian stent, decrease in size of the left renal hematoma    Patient underwent EGD for hematemesis 6/18 with demonstrated normal esophagus, severe erosive gastropathy, biopsy sent for H. pylori, normal duodenum, discharged with 40 daily Protonix  Labs: ordered.  Radiology: ordered.  ECG/medicine tests: ordered.    Risk  Prescription drug management.      ED Course as of 09/23/22 1430   Fri Sep 23, 2022   1306 EKG obtained and interpreted by me shows normal sinus rhythm rate of 72, normal axis, pursue AV block with a PR of 230, QRS is 118, no STEMI no peaked T waves [WB]   1307 Hemoglobin stable at 7.9, 8 four months ago [WB]   1308 White blood cell count is unremarkable [WB]   1332 Patient is hyponatremic to 125, she does have nausea and vomiting, plan transfer for hemodialysis, ongoing evaluation [WB]   1333 Patient with normal sodium 6/28 [WB]   1342 X-ray reviewed by myself shows stent in place, stable cardiomediastinal, no consolidation, no effusion [WB]      ED Course User Index  [WB] Corky Crafts, MD       FINAL IMPRESSION     1. Cellulitis of  left upper extremity          DISPOSITION/PLAN     CLINICAL IMPRESSION    Transfer: The patient is being transferred to [facility] for admission to hospitalist. The results of their tests and reasons for their transfer have been discussed with the patient and/or available family. The patient/family has conveyed agreement and understanding for the need to be admitted and for their admission diagnosis. Consultation has been made with Dr Lum Babe, who agrees to accept the transfer.      I am the Primary Clinician of Record.   Corky Crafts, MD (electronically signed)    (Please note that parts of this dictation were completed with voice recognition software. Quite often unanticipated grammatical, syntax, homophones, and other interpretive errors are inadvertently transcribed by the computer software. Please disregards these errors. Please excuse any errors that have escaped final proofreading.)         Corky Crafts, MD  09/23/22 1432

## 2022-09-24 ENCOUNTER — Inpatient Hospital Stay: Admit: 2022-09-25 | Payer: PRIVATE HEALTH INSURANCE

## 2022-09-24 ENCOUNTER — Inpatient Hospital Stay
Admit: 2022-09-24 | Discharge: 2022-10-04 | Discharge status: Against Medical Advice | Payer: PRIVATE HEALTH INSURANCE | Attending: Family Medicine | Admitting: Family Medicine

## 2022-09-24 LAB — CBC WITH AUTO DIFFERENTIAL
Basophils %: 1 % (ref 0–1)
Basophils Absolute: 0 10*3/uL (ref 0.0–0.1)
Eosinophils %: 9 % — ABNORMAL HIGH (ref 0–7)
Eosinophils Absolute: 0.4 10*3/uL (ref 0.0–0.4)
Hematocrit: 26.7 % — ABNORMAL LOW (ref 35.0–47.0)
Hemoglobin: 8.9 g/dL — ABNORMAL LOW (ref 11.5–16.0)
Immature Granulocytes %: 1 % — ABNORMAL HIGH (ref 0.0–0.5)
Immature Granulocytes Absolute: 0 10*3/uL (ref 0.00–0.04)
Lymphocytes %: 7 % — ABNORMAL LOW (ref 12–49)
Lymphocytes Absolute: 0.3 10*3/uL — ABNORMAL LOW (ref 0.8–3.5)
MCH: 30.1 PG (ref 26.0–34.0)
MCHC: 33.3 g/dL (ref 30.0–36.5)
MCV: 90.2 FL (ref 80.0–99.0)
MPV: 10.5 FL (ref 8.9–12.9)
Monocytes %: 4 % — ABNORMAL LOW (ref 5–13)
Monocytes Absolute: 0.2 10*3/uL (ref 0.0–1.0)
Neutrophils %: 78 % — ABNORMAL HIGH (ref 32–75)
Neutrophils Absolute: 3.5 10*3/uL (ref 1.8–8.0)
Nucleated RBCs: 0 PER 100 WBC
Platelets: 204 10*3/uL (ref 150–400)
RBC: 2.96 M/uL — ABNORMAL LOW (ref 3.80–5.20)
RDW: 14.6 % — ABNORMAL HIGH (ref 11.5–14.5)
WBC: 4.4 10*3/uL (ref 3.6–11.0)
nRBC: 0 10*3/uL (ref 0.00–0.01)

## 2022-09-24 LAB — COMPREHENSIVE METABOLIC PANEL W/ REFLEX TO MG FOR LOW K
ALT: 14 U/L (ref 12–78)
AST: 10 U/L — ABNORMAL LOW (ref 15–37)
Albumin/Globulin Ratio: 0.8 — ABNORMAL LOW (ref 1.1–2.2)
Albumin: 3 g/dL — ABNORMAL LOW (ref 3.5–5.0)
Alk Phosphatase: 143 U/L — ABNORMAL HIGH (ref 45–117)
Anion Gap: 11 mmol/L (ref 5–15)
BUN/Creatinine Ratio: 5 — ABNORMAL LOW (ref 12–20)
BUN: 67 MG/DL — ABNORMAL HIGH (ref 6–20)
CO2: 24 mmol/L (ref 21–32)
Calcium: 8.1 MG/DL — ABNORMAL LOW (ref 8.5–10.1)
Chloride: 93 mmol/L — ABNORMAL LOW (ref 97–108)
Creatinine: 13.5 MG/DL — ABNORMAL HIGH (ref 0.55–1.02)
Est, Glom Filt Rate: 3 mL/min/{1.73_m2} — ABNORMAL LOW (ref >60)
Globulin: 3.7 g/dL (ref 2.0–4.0)
Glucose: 102 mg/dL — ABNORMAL HIGH (ref 65–100)
Potassium: 3.9 mmol/L (ref 3.5–5.1)
Sodium: 128 mmol/L — ABNORMAL LOW (ref 136–145)
Total Bilirubin: 0.2 MG/DL (ref 0.2–1.0)
Total Protein: 6.7 g/dL (ref 6.4–8.2)

## 2022-09-24 LAB — CULTURE, BLOOD, PCR ID PANEL RESULTS REPORT
Acinetobacter calcoac baumannii complex by PCR: NOT DETECTED
Bacteroides fragilis by PCR: NOT DETECTED
Candida albicans by PCR: NOT DETECTED
Candida auris by PCR: NOT DETECTED
Candida glabrata: NOT DETECTED
Candida krusei by PCR: NOT DETECTED
Candida parapsilosis by PCR: NOT DETECTED
Candida tropicalis by PCR: NOT DETECTED
Cryptococcus neoformans/gattii by PCR: NOT DETECTED
Enterobacter cloacae complex by PCR: NOT DETECTED
Enterobacteriaceae by PCR: NOT DETECTED
Enterococcus faecalis by PCR: NOT DETECTED
Enterococcus faecium by PCR: NOT DETECTED
Escherichia Coli: NOT DETECTED
Haemophilus Influenzae by PCR: NOT DETECTED
Klebsiella aerogenes by PCR: NOT DETECTED
Klebsiella oxytoca by PCR: NOT DETECTED
Klebsiella pneumoniae group by PCR: NOT DETECTED
Listeria monocytogenes by PCR: NOT DETECTED
Neisseria meningitidis by PCR: NOT DETECTED
Proteus by PCR: NOT DETECTED
Pseudomonas aeruginosa: NOT DETECTED
Resistant gene meca/c & mrej by PCR: NOT DETECTED
STAPHYLOCOCCUS: DETECTED — AB
STREPTOCOCCUS: NOT DETECTED
Salmonella species by PCR: NOT DETECTED
Serratia marcescens by PCR: NOT DETECTED
Staphylococcus Aureus: DETECTED — AB
Staphylococcus epidermidis by PCR: NOT DETECTED
Staphylococcus lugdunensis by PCR: NOT DETECTED
Stenotrophomonas maltophilia by PCR: NOT DETECTED
Strep pneumoniae: NOT DETECTED
Strep pyogenes,(Grp. A): NOT DETECTED
Streptococcus agalactiae (Group B): NOT DETECTED

## 2022-09-24 LAB — APTT: APTT: 33.8 s — ABNORMAL HIGH (ref 22.1–31.0)

## 2022-09-24 LAB — PROTIME-INR
INR: 1.1 (ref 0.9–1.1)
Protime: 11.7 s — ABNORMAL HIGH (ref 9.0–11.1)

## 2022-09-24 LAB — TYPE AND SCREEN
ABO/Rh: O POS
Antibody Screen: NEGATIVE

## 2022-09-24 LAB — TROPONIN: Troponin, High Sensitivity: 21 ng/L (ref 0–51)

## 2022-09-24 LAB — PHOSPHORUS: Phosphorus: 3.4 MG/DL (ref 2.6–4.7)

## 2022-09-24 MED ORDER — SODIUM CHLORIDE 0.9 % IV SOLN
0.9 | INTRAVENOUS | Status: DC | PRN
Start: 2022-09-24 — End: 2022-10-04

## 2022-09-24 MED ORDER — HYDROMORPHONE HCL PF 1 MG/ML IJ SOLN
1 MG/ML | Freq: Four times a day (QID) | INTRAMUSCULAR | Status: AC | PRN
Start: 2022-09-24 — End: 2022-09-28
  Administered 2022-09-24 – 2022-09-28 (×15): 1 mg via INTRAVENOUS

## 2022-09-24 MED ORDER — CALCIUM ACETATE (PHOS BINDER) 667 MG PO TABS
667 | Freq: Three times a day (TID) | ORAL | Status: DC
Start: 2022-09-24 — End: 2022-10-04
  Administered 2022-09-24 – 2022-10-04 (×26): 667 via ORAL

## 2022-09-24 MED ORDER — HYDROMORPHONE HCL PF 1 MG/ML IJ SOLN
1 | INTRAMUSCULAR | Status: DC | PRN
Start: 2022-09-24 — End: 2022-09-24
  Administered 2022-09-24 (×4): 1 mg via INTRAVENOUS

## 2022-09-24 MED ORDER — NORMAL SALINE FLUSH 0.9 % IV SOLN
0.9 | Freq: Two times a day (BID) | INTRAVENOUS | Status: DC
Start: 2022-09-24 — End: 2022-10-04
  Administered 2022-09-24 – 2022-10-03 (×14): 10 mL via INTRAVENOUS

## 2022-09-24 MED ORDER — AMLODIPINE BESYLATE 5 MG PO TABS
5 | Freq: Every day | ORAL | Status: DC
Start: 2022-09-24 — End: 2022-10-04
  Administered 2022-09-24 – 2022-10-04 (×11): 10 mg via ORAL

## 2022-09-24 MED ORDER — METOPROLOL SUCCINATE ER 25 MG PO TB24
25 | Freq: Every day | ORAL | Status: DC
Start: 2022-09-24 — End: 2022-10-04
  Administered 2022-09-24 – 2022-10-04 (×11): 25 mg via ORAL

## 2022-09-24 MED ORDER — POLYETHYLENE GLYCOL 3350 17 G PO PACK
17 | Freq: Every day | ORAL | Status: DC | PRN
Start: 2022-09-24 — End: 2022-10-04

## 2022-09-24 MED ORDER — ONDANSETRON 4 MG PO TBDP
4 MG | Freq: Three times a day (TID) | ORAL | Status: DC | PRN
Start: 2022-09-24 — End: 2022-09-24

## 2022-09-24 MED ORDER — NORMAL SALINE FLUSH 0.9 % IV SOLN
0.9 | INTRAVENOUS | Status: DC | PRN
Start: 2022-09-24 — End: 2022-10-04

## 2022-09-24 MED ORDER — PROMETHAZINE HCL 25 MG PO TABS
25 MG | Freq: Four times a day (QID) | ORAL | Status: AC | PRN
Start: 2022-09-24 — End: 2022-10-04
  Administered 2022-09-24 – 2022-10-03 (×34): 25 mg via ORAL

## 2022-09-24 MED ORDER — ONDANSETRON HCL 4 MG/2ML IJ SOLN
42 MG/2ML | Freq: Four times a day (QID) | INTRAMUSCULAR | Status: DC | PRN
Start: 2022-09-24 — End: 2022-09-24
  Administered 2022-09-24 (×2): 4 mg via INTRAVENOUS

## 2022-09-24 MED ORDER — VANCOMYCIN INTERMITTENT DOSING (PLACEHOLDER)
INTRAVENOUS | Status: DC
Start: 2022-09-24 — End: 2022-09-24

## 2022-09-24 MED ORDER — APIXABAN 2.5 MG PO TABS
2.5 MG | Freq: Two times a day (BID) | ORAL | Status: DC
Start: 2022-09-24 — End: 2022-09-24
  Administered 2022-09-24: 04:00:00 2.5 mg via ORAL

## 2022-09-24 MED ORDER — CEFAZOLIN SODIUM 1 G IJ SOLR
1 g | INTRAMUSCULAR | Status: AC
Start: 2022-09-24 — End: 2022-10-04
  Administered 2022-09-24 – 2022-10-02 (×9): 1000 mg via INTRAVENOUS

## 2022-09-24 MED ORDER — HYDRALAZINE HCL 25 MG PO TABS
25 | Freq: Three times a day (TID) | ORAL | Status: DC
Start: 2022-09-24 — End: 2022-10-04
  Administered 2022-09-24 – 2022-10-04 (×27): 25 mg via ORAL

## 2022-09-24 MED ORDER — PANTOPRAZOLE SODIUM 40 MG PO TBEC
40 | Freq: Every day | ORAL | Status: DC
Start: 2022-09-24 — End: 2022-10-04
  Administered 2022-09-24 – 2022-10-04 (×11): 40 mg via ORAL

## 2022-09-24 MED FILL — ONDANSETRON HCL 4 MG/2ML IJ SOLN: 4 MG/2ML | INTRAMUSCULAR | Qty: 2

## 2022-09-24 MED FILL — HYDRALAZINE HCL 25 MG PO TABS: 25 MG | ORAL | Qty: 1

## 2022-09-24 MED FILL — HYDROMORPHONE HCL 1 MG/ML IJ SOLN: 1 MG/ML | INTRAMUSCULAR | Qty: 1

## 2022-09-24 MED FILL — PROMETHAZINE HCL 25 MG PO TABS: 25 MG | ORAL | Qty: 1

## 2022-09-24 MED FILL — CALCIUM ACETATE (PHOS BINDER) 667 MG PO TABS: 667 MG | ORAL | Qty: 1

## 2022-09-24 MED FILL — PANTOPRAZOLE SODIUM 40 MG PO TBEC: 40 MG | ORAL | Qty: 1

## 2022-09-24 MED FILL — CEFAZOLIN SODIUM 1 G IJ SOLR: 1 g | INTRAMUSCULAR | Qty: 1000

## 2022-09-24 MED FILL — NORMAL SALINE FLUSH 0.9 % IV SOLN: 0.9 % | INTRAVENOUS | Qty: 40

## 2022-09-24 MED FILL — AMLODIPINE BESYLATE 5 MG PO TABS: 5 MG | ORAL | Qty: 2

## 2022-09-24 MED FILL — ELIQUIS 2.5 MG PO TABS: 2.5 MG | ORAL | Qty: 1

## 2022-09-24 MED FILL — METOPROLOL SUCCINATE ER 25 MG PO TB24: 25 MG | ORAL | Qty: 1

## 2022-09-24 NOTE — Consults (Signed)
[facility]                 [address] [place], [place]  [address]                              CONSULTATION      PATIENT NAME: Hawkins, Meredith             DOB: 10/29/1985  MED REC NO: [id number]                       ROOM: [id number]  ACCOUNT NO: [id number]                       ADMIT DATE: 09/23/2022  PROVIDER: Lenn Sink, MD    DATE OF SERVICE:  09/24/2022    ATTENDING PHYSICIAN:  MAHMUD UZZAMAN    REASON FOR CONSULTATION:  Distended neck veins; history of SVC syndrome, status post stenting; right flank pain, and left arm drainage.    HISTORY OF PRESENT ILLNESS:  The patient is a complex 37 year old with end-stage renal disease, on dialysis with multiple prior issues related to this and an extraordinary amount of hospitalizations over the last several months between here and areas in [place].  Please refer to extensive records within the chart.  Today, she reports swelling of her right breast, concerns for SVC syndrome, again a chronic finding and drainage from her remotely placed left arm fistula, she said that has been occluded for years.    PAST MEDICAL HISTORY:  As above.  I saw her last when she was admitted here in February related to renal hemorrhage.  She has history of lupus, VSD, asthma, end-stage renal disease, and multiple other issues, please see list.    MEDICINES:  Currently include Norvasc, PhosLo, hydralazine, Toprol-XL, Protonix, and currently on vancomycin.    ALLERGIES:  SHE HAS 8 ALLERGIES, PLEASE SEE LIST.      PHYSICAL EXAMINATION:  VITAL SIGNS:  She appears to be afebrile at 98.2, heart rate 77, blood pressure is 140/79, 100% on room air.  GENERAL:  Currently, in no acute distress.  She is alert and oriented.  CHEST:  Clear to auscultation.  HEART:  Regular rate and rhythm.  ABDOMEN:  Soft.  EXTREMITIES:  Right upper extremity is not swollen at all and there is a patent right upper arm fistula with good bruit and thrill.  There is some swelling of the left  arm with serous purulent drainage from one particular site.  It is difficult to feel there is an actual graft underneath this, she reports that is only a fistula.    LABORATORY DATA:  Her lab values have been reviewed.  Her hemoglobin is 8.9.  Her chemistries have been reviewed.  Her sodium is low at 128.  She appears to have bacteremia with Staph aureus.  So far, there is no pertinent imaging.    IMPRESSION AND PLAN:  A 37 year old female with complex medical history with multiple admissions for different reasons, please see the extensive documentation from hospital visits between here and [facility] all recently.  Nevertheless, I think she clearly needs antibiotics for the bacteremia and further workup of this.  It is very unlikely that she has a central stenosis given that she has no right arm swelling in the arm with the patent fistula.  We will follow how her wound does in her left arm.  She may need exploration if this issue worsens.  Otherwise, it is clear that she will need antibiotics either here as an inpatient or continued wherever she gets dialysis.  We will follow along.        Lenn Sink, MD      AM/AQS  D:  09/24/2022 11:34:01  T:  09/24/2022 14:33:38  JOB #:  [id number]

## 2022-09-24 NOTE — Consults (Signed)
[facility] Renal Consult Note      NAME:  Meredith Hawkins   DOB:   Apr 25, 1985   MRN:  [id number]     Requesting Physician Lucky Rathke, MD   Reason for Consult:  End-stage renal disease, dialysis     PCP:  No primary care provider on file.     Date/Time:  09/24/2022 1:24 PM          Subjective:     CHIEF COMPLAINT: Left arm swelling, dialysis    HISTORY OF PRESENT ILLNESS:     Meredith Hawkins is a 37 y.o.  African American female who is admitted to the medical service with left arm swelling, possible abscess.  We are asked to evaluate for dialysis.      Meredith Hawkins is a 37 y.o. 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 ED with multiple complaints including pain and swelling of neck, right breast/ chest, right axilla, right upper arm, left upper arm, drainage from right upper arm wound, abdominal pain, nausea and vomiting.  Patient noted onset of symptoms starting about 1 week ago.  Symptoms notably have remained constant, severe, aching, aggravated with touch, without alleviating factors.     Per patient, last dialysis was Tuesday, she subsequently visited [place] and has not had dialysis.  Presented to [facility] with left arm swelling and need for dialysis.  She is currently not hyperkalemic or excessively volume overloaded.  Blood pressure is stable.  Her left arm has been swollen and apparently had an ultrasound evaluation and was a community ER prior to transfer to [facility].  No abscess was noted.  She is on vancomycin.  No history of recent bacteremia    Past Medical  History:   Diagnosis Date    Asthma     Lupus (HCC)     VSD (ventricular septal defect)         No past surgical history on file.    Social History     Tobacco Use    Smoking status: Never    Smokeless tobacco: Never   Substance Use Topics    Alcohol use: Never        No family history on file.     Allergies   Allergen Reactions    Ace Inhibitors Anaphylaxis     Pt states "all of these have closed my throat"    Acetaminophen Anaphylaxis     Pt states "all of these have closed my throat"    Codeine Anaphylaxis     Pt states "all of these have closed my throat"    Fentanyl Anaphylaxis    Hydrocodone Anaphylaxis     Pt states "all of these have closed my throat"    Nsaids Other (See Comments)     pcp states not to take due to kidneys     Oxycodone Anaphylaxis     Pt states "all of these have closed my throat"    Sulfa Antibiotics Anaphylaxis        Prior to Admission medications    Medication Sig Start Date End Date Taking? Authorizing Provider   hydrALAZINE (APRESOLINE) 25 MG tablet Take 1 tablet by mouth 3 times daily 08/03/21 05/27/23 Yes [provider]   metoprolol succinate (TOPROL XL) 25 MG extended release tablet Take 1 tablet by mouth daily 06/11/21 05/27/23 Yes [provider]   pantoprazole (PROTONIX) 40 MG tablet Take 1 tablet by mouth daily 03/26/22 10/07/22 Yes [provider]   amLODIPine (NORVASC) 10 MG tablet Take 1 tablet by mouth daily    [provider]   calcium acetate (PHOSLO) 667 MG CAPS capsule Take 1 capsule by mouth 3 times daily (with meals)    [provider]   apixaban (ELIQUIS) 2.5 MG TABS tablet Take 1 tablet by mouth 2 times daily    [provider]         Current Facility-Administered Medications:     HYDROmorphone HCl PF (DILAUDID) injection 1 mg, 1 mg, IntraVENous, Q4H PRN, Pegram, Amelia Jo, MD, 1 mg at 09/24/22 1323    ceFAZolin (ANCEF) 1,000 mg in sterile water 10 mL IV syringe, 1,000 mg, IntraVENous, Q24H, Dowdy, Margaret A, APRN -  NP    sodium chloride flush 0.9 % injection 5-40 mL, 5-40 mL, IntraVENous, 2 times per day, Lucinda Dell, MD, 10 mL at 09/24/22 0815    sodium chloride flush 0.9 % injection 5-40 mL, 5-40 mL, IntraVENous, PRN, Pegram, Melvin L, MD    0.9 % sodium chloride infusion, , IntraVENous, PRN, Pegram, Amelia Jo, MD    ondansetron (ZOFRAN-ODT) disintegrating tablet 4 mg, 4 mg, Oral, Q8H PRN **OR** ondansetron (ZOFRAN) injection 4 mg, 4 mg, IntraVENous, Q6H PRN, Pegram, Melvin L, MD, 4 mg at 09/24/22 0908    polyethylene glycol (GLYCOLAX) packet 17 g, 17 g, Oral, Daily PRN, Pegram, Melvin L, MD    amLODIPine (NORVASC) tablet 10 mg, 10 mg, Oral, Daily, Pegram, Melvin L, MD, 10 mg at 09/24/22 0815    metoprolol succinate (TOPROL XL) extended release tablet 25 mg, 25 mg, Oral, Daily, Pegram, Melvin L, MD, 25 mg at 09/24/22 0815    pantoprazole (PROTONIX) tablet 40 mg, 40 mg, Oral, Daily, Pegram, Melvin L, MD, 40 mg at 09/24/22 0815    hydrALAZINE (APRESOLINE) tablet 25 mg, 25 mg, Oral, TID, Pegram, Melvin L, MD, 25 mg at 09/24/22 1323    calcium acetate (PHOSLO) tablet 667 mg, 1 tablet, Oral, TID WC, Pegram, Amelia Jo, MD, 667 mg at 09/24/22 1236      Review of Systems:  Not short of breath, had nausea yesterday and some this morning but just completed lunch without nausea or vomiting.  No abdominal pain.  Does have hyperesthesia lower extremities.  Left arm is swollen and left upper arm above the biceps is tender with an indurated area.  No seizure, tremors or asterixis  No blood in stool, no hemoptysis or hematemesis       Objective:      VITALS:    Vital signs reviewed; most recent are:    Vitals:    09/24/22 1323   BP: 137/70   Pulse: 66   Resp: 18   Temp:    SpO2: 98%     SpO2 Readings from Last 6 Encounters:   09/24/22 98%   09/23/22 97%   05/18/22 98%   05/07/22 100%        No intake or output data in the 24 hours ending 09/24/22 1324         Exam:   Physical Exam:General appearance: alert, appears stated age,  cooperative, and no distress  Head: Normocephalic, without obvious abnormality, atraumatic  Neck: no JVD and supple, symmetrical, trachea  midline  Lungs: clear to auscultation bilaterally  Heart: regular rate and rhythm, no S3 or S4, and 2-6 holosystolic murmur left renal border A2 preserved  Abdomen: soft, non-tender; bowel sounds normal; no masses,  no organomegaly  Extremities:  Trace distal pretibial edema, Right upper extremity AV fistula with good thrill and bruit   Neurologic: Grossly normal      LABS:  Recent Labs     09/24/22  0711 09/23/22  1251   NA 128* 125*   K 3.9 4.3   CL 93* 89*   CO2 24 23   BUN 67* 62*   CREATININE 13.50* 11.98*   CALCIUM 8.1* 8.3*   PHOS 3.4  --      Recent Labs     09/24/22  0711 09/23/22  1251   WBC 4.4 6.2   HGB 8.9* 7.9*   HCT 26.7* 23.6*   PLT 204 179     No results for input(s): "POCGLU" in the last 720 hours.  No results for input(s): "PH", "PCO2", "PO2", "HCO3", "FIO2" in the last 720 hours.  @LABRCNT30day (INR:1)  No results for input(s): "KU", "CLU" in the last 720 hours.    Invalid input(s): "NAU", "CREAU", "PROU"    Radiology  XR CHEST PORTABLE    Result Date: 09/23/2022  No acute process on portable chest. Electronically signed by Syliva Overman         Assessment:   Renal Specific Problems  CKD stage VI  Hyponatremia  Anemia renal failure  Hypertension with renal failure  Left upper extremity swelling, no abscess by ultrasound          Plan:     Obtain/ Order: labs/cultures/radiology/procedures:         Therapeutic:    Will arrange for dialysis today x 4 hours per her usual schedule.  However, she missed today so we will complete for treatment.  Ultrafiltrate 3 L.    Risk of deterioration: low      Total time spent with patient: 30 Minutes                  Discussed:   Patient and dialysis staff           ___________________________________________________    Attending Physician: Dala Dock, MD

## 2022-09-24 NOTE — Progress Notes (Signed)
Pharmacist Note - Vancomycin Dosing (Hemodialysis Patient)    Consult provided for this 37 y.o. female for indication of SSTI.  This patient is also on the following antibiotic regimen(s): vancomycin  Patient on vancomycin PTA? YES - Patient received 2000 mg (1000 mg x 2 at [facility] 7/5 @1846 )    Lab Results   Component Value Date/Time    WBC 6.2 09/23/2022 12:51 PM    BUN 62 09/23/2022 12:51 PM   Temp (24hrs), Avg:98.4 F (36.9 C), Min:98.1 F (36.7 C), Max:99.1 F (37.3 C)      Estimated CrCl:  ESRD on HD ( unclear outpatient schedule - patient states she was dialyzed on 7/2 in an ED - lives in [place] and here visiting  )    Cultures:  7/5 - Blood cultures x 2 NGTD < 24 hours  MRSA Swab ordered (if applicable)? N/A    For this HD patient, initiated therapy with a loading dose of Vancomycin 2000 mg (Received at [facility]), with next dose to be ordered after HD schedule. Currently no inpatient HD ordered, nephrology consult has been requested. Dose will be adjusted to maintain a pre-HD trough of approximately 15 - 20 mcg/mL for therapeutic goal of 10 - 15 mcg/mL as approximately 35% of drug will be removed by HD filtration.  Pharmacy to follow patient daily and order levels / make dose adjustments as appropriate.     Nicholaus Corolla, PharmD  [phone]

## 2022-09-24 NOTE — Progress Notes (Signed)
Hospitalist Progress Note  Clearnce Hasten, APRN - NP  Answering service: [phone] OR [phone] from in house phone        Date of Service:  09/24/2022  NAME:  Meredith Hawkins  DOB:  11-10-1985  MRN:  [id number]      Admission Summary:     As per H&P: "Meredith Hawkins is a 37 y.o. 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  Patient noted onset of symptoms starting about 1 week ago.  Symptoms notably have remained constant, severe, aching, aggravated with touch, without alleviating factors.  Patient is resident of [place], [place] but she is visiting here in [place].  Per chart review, patient was recent hospitalized at [facility] on 09/16/2022 complaining of right-sided flank, and swelling and pain of right breast, anemia (transfused 2 units of blood with hemoglobin 6.4); was seen by GI with plan for video capsule endoscopy but patient left AMA.  Over the past year, she has had multiple hospitalizations but each discharge record notes Eliquis on discharge medication list.  There have been no other further reports of GI bleed.  Patient does not complain of any hematemesis, hematochezia, or melena or other bleeding over the past week.  She presented to the ER today with the aforementioned complaints.  She reportedly was  last dialyzed on 09/20/2022 in the ED at outside hospital in [place].  She notes that she does not go to a dialysis center for hemodialysis due to "personal reasons".  Patient went to [facility] ED today where workup included labs showing hemoglobin 7.9, neutrophils 84%, sodium 125, BUN 62, creatinine 11.98, GFR 4, calcium 8.3, albumin 3.0, alkaline phosphatase 135.  12-lead EKG shows sinus rhythm, first-degree AV block at 72 beats minute.  ED ordered Dilaudid 1 mg IV x 2 + 0.5 mg IV x 1, Benadryl 25 mg IV x 1, Protonix 40 mg IV x 1, ceftriaxone 1000 mg IV x 1, and vancomycin 1000 mg IV x 1 dose.  ED performed limited bedside ultrasound of soft tissues left upper extremity showing diffuse cobblestoning consistent with cellulitis but no fluid collection consistent with abscess.  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  ED requested transfer to [facility]. "    Interval history / Subjective:     Patient reports that she has been banned from all local area dialysis centers in [place] where she lives. She goes to the [facility] ER three days a week for her dialysis. She arrived in [place] on Wednesday. Has not had dialysis since last Monday.     Hard indurated infection to left upper extremity, MSSA bacteremia.     Patient is now reporting dark stools and emesis this afternoon.      Assessment & Plan:        Cellulitis of left upper extremity  MSSA Bacteremia   -No reported evidence of abscess on bedside ultrasound of left upper extremity ( I do not have access to this ultrasound)   -  area of induration to left upper extremity   - CT scan w/wo contrast left upper extremity to evaluate for indurated abscess vs. Infected graft   -paired blood cultures pending   -change IV antibiotic to Ancef , MSSA bacteremia   - repeat blood cultures on 7/6     Right Breast Skin Changes   - chest CT w/wo contrast ordered  - lymphadenopathy and thickened skin  noted   - breast is also significantly larger than left      Acute hyponatremia  -Initial serum sodium 125-----> 128   -Repeat serum sodium proceed toward slow correction  -If able to make any urine, send urine sodium and osmolality     SVC syndrome, ruled out per Vascular Surgery   -History of SVC thrombosis/stent  -Eliquis on hold with patient reporting "dark stools" and vomit with "red blood" today   -Consult vascular surgeon     Edema of both upper extremities  Edema of neck  -plan as above     Anemia of chronic disease  -Hemoglobin 7.9 which is about patient's baseline but increased compared to hemoglobin 6.4 reported on recent hospitalization  -Repeat H&H  -Check iron profile, B12, folate level  -Order fecal occult blood test  -Transfuse if hemoglobin less than 7.0     Generalized abdominal pain  -Received Dilaudid without allergic reaction.  May continue 1 mg IV every 4 hours as needed for severe pain     ESRD (end stage renal disease)  -Reportedly does not follow-up with outpatient nephrologist or dialysis center  -Consult nephrologist (unreferred) for hemodialysis  -Last hemodialysis was on Tuesday, 09/20/2022 per patient report  - will start HD this evening              Code status: Full   Prophylaxis: SCDs  Care Plan discussed with: Patient, RN, Attending, Vascular Surgery   Anticipated Disposition:      Principal Problem:    Cellulitis of left upper extremity  Resolved Problems:    * No resolved hospital problems. *            Review of Systems:   Pertinent items are noted in HPI.         Vital Signs:    Last 24hrs VS reviewed since prior progress note. Most recent are:  BP 137/70   Pulse 62   Temp 98.2 F (36.8 C) (Oral)   Resp 17   Wt 81.8 kg (180 lb 5.4 oz)   SpO2 98%   BMI 29.11 kg/m      No intake or output data in the 24 hours ending 09/24/22 1617     Physical Examination:     I had a face to face encounter with this patient and independently examined them on 09/24/2022 as outlined  below:          General : alert x 3, awake, no acute distress,   HEENT: PEERL, EOMI, moist mucus membrane  Neck: supple, no JVD, no meningeal signs  Chest: Clear to auscultation bilaterally   Right breast with peau d'orange changes noted to skin, lymphadenopathy to right axilla   CVS: S1 S2 heard, Capillary refill less than 2 seconds  Right fistula with + bruit and + thrill   Abd: soft/ non tender, non distended, BS physiological,   Ext: no clubbing, no cyanosis, no edema, brisk 2+ DP pulses, left upper extremity with purulent drainage from small pinhole , area of induration surrounding, tender to touch   Neuro/Psych:  pleasant mood and affect, CN 2-12 grossly intact, sensory grossly within normal limit, Strength 5/5 in all extremities  Skin: warm            Data Review:    Review and/or order of clinical lab test  Review and/or order of tests in the radiology section of CPT  Review and/or order of tests in the medicine section of CPT    I have independently reviewed and interpreted patient's lab and all other diagnostic data    Notes reviewed from all clinical/nonclinical/nursing services involved in patient's clinical care. Care coordination discussions were held with appropriate clinical/nonclinical/ nursing providers based on care coordination needs.     Labs:     Recent Labs     09/23/22  1251 09/24/22  0711   WBC 6.2 4.4   HGB 7.9* 8.9*   HCT 23.6* 26.7*   PLT 179 204     Recent Labs     09/23/22  1251 09/24/22  0711   NA 125* 128*   K 4.3 3.9   CL 89* 93*   CO2 23 24   BUN 62* 67*   PHOS  --  3.4     Recent Labs     09/23/22  1251 09/24/22  0711   ALT 9* 14   GLOB 3.7 3.7     Recent Labs     09/24/22  0711   INR 1.1   APTT 33.8*      No results for input(s): "TIBC" in the last 72 hours.    Invalid input(s): "FE", "PSAT", "FERR"   No results found for: "RBCF"   No results for input(s): "PH", "PCO2", "PO2" in the last 72 hours.  No results for input(s): "CPK" in the last 72 hours.    Invalid input(s): "CPKMB",  "CKNDX", "TROIQ"  No results found for: "CHOL", "CHLST", "CHOLV", "HDL", "HDLC", "LDL"  No results found for: "GLUCPOC"        Medications Reviewed:     Current Facility-Administered Medications   Medication Dose Route Frequency    ceFAZolin (ANCEF) 1,000 mg in sterile water 10 mL IV syringe  1,000 mg IntraVENous Q24H    promethazine (PHENERGAN) tablet 25 mg  25 mg Oral Q6H PRN    HYDROmorphone HCl PF (DILAUDID) injection 1 mg  1 mg IntraVENous Q6H PRN    sodium chloride flush 0.9 % injection 5-40 mL  5-40 mL IntraVENous 2 times per day    sodium chloride flush 0.9 % injection 5-40 mL  5-40 mL IntraVENous PRN    0.9 % sodium chloride infusion   IntraVENous PRN    polyethylene glycol (GLYCOLAX) packet 17 g  17 g Oral Daily PRN    amLODIPine (NORVASC) tablet 10 mg  10 mg Oral Daily    metoprolol succinate (TOPROL XL) extended release tablet 25 mg  25 mg Oral Daily    pantoprazole (PROTONIX) tablet 40 mg  40 mg Oral Daily    hydrALAZINE (APRESOLINE) tablet 25 mg  25 mg Oral TID    calcium acetate (PHOSLO) tablet 667 mg  1 tablet Oral TID WC     ______________________________________________________________________  EXPECTED LENGTH OF STAY: Unable to retrieve estimated LOS  ACTUAL LENGTH OF STAY:          1                 Clearnce Hasten, APRN - NP

## 2022-09-25 ENCOUNTER — Inpatient Hospital Stay: Admit: 2022-09-25 | Payer: PRIVATE HEALTH INSURANCE

## 2022-09-25 LAB — CBC WITH AUTO DIFFERENTIAL
Basophils %: 1 % (ref 0–1)
Basophils Absolute: 0 10*3/uL (ref 0.0–0.1)
Eosinophils %: 12 % — ABNORMAL HIGH (ref 0–7)
Eosinophils Absolute: 0.4 10*3/uL (ref 0.0–0.4)
Hematocrit: 22.5 % — ABNORMAL LOW (ref 35.0–47.0)
Hemoglobin: 7.4 g/dL — ABNORMAL LOW (ref 11.5–16.0)
Immature Granulocytes %: 1 % — ABNORMAL HIGH (ref 0.0–0.5)
Immature Granulocytes Absolute: 0 10*3/uL (ref 0.00–0.04)
Lymphocytes %: 11 % — ABNORMAL LOW (ref 12–49)
Lymphocytes Absolute: 0.4 10*3/uL — ABNORMAL LOW (ref 0.8–3.5)
MCH: 29.5 PG (ref 26.0–34.0)
MCHC: 32.9 g/dL (ref 30.0–36.5)
MCV: 89.6 FL (ref 80.0–99.0)
MPV: 9.8 FL (ref 8.9–12.9)
Monocytes %: 9 % (ref 5–13)
Monocytes Absolute: 0.3 10*3/uL (ref 0.0–1.0)
Neutrophils %: 66 % (ref 32–75)
Neutrophils Absolute: 2.5 10*3/uL (ref 1.8–8.0)
Nucleated RBCs: 0 PER 100 WBC
Platelets: 183 10*3/uL (ref 150–400)
RBC: 2.51 M/uL — ABNORMAL LOW (ref 3.80–5.20)
RDW: 14.6 % — ABNORMAL HIGH (ref 11.5–14.5)
WBC: 3.6 10*3/uL (ref 3.6–11.0)
nRBC: 0 10*3/uL (ref 0.00–0.01)

## 2022-09-25 LAB — COMPREHENSIVE METABOLIC PANEL
ALT: 10 U/L — ABNORMAL LOW (ref 12–78)
AST: 6 U/L — ABNORMAL LOW (ref 15–37)
Albumin/Globulin Ratio: 0.8 — ABNORMAL LOW (ref 1.1–2.2)
Albumin: 2.9 g/dL — ABNORMAL LOW (ref 3.5–5.0)
Alk Phosphatase: 141 U/L — ABNORMAL HIGH (ref 45–117)
Anion Gap: 9 mmol/L (ref 5–15)
BUN/Creatinine Ratio: 5 — ABNORMAL LOW (ref 12–20)
BUN: 75 MG/DL — ABNORMAL HIGH (ref 6–20)
CO2: 24 mmol/L (ref 21–32)
Calcium: 8.1 MG/DL — ABNORMAL LOW (ref 8.5–10.1)
Chloride: 95 mmol/L — ABNORMAL LOW (ref 97–108)
Creatinine: 15.1 MG/DL — ABNORMAL HIGH (ref 0.55–1.02)
Est, Glom Filt Rate: 3 mL/min/{1.73_m2} — ABNORMAL LOW (ref >60)
Globulin: 3.8 g/dL (ref 2.0–4.0)
Glucose: 116 mg/dL — ABNORMAL HIGH (ref 65–100)
Potassium: 4.5 mmol/L (ref 3.5–5.1)
Sodium: 128 mmol/L — ABNORMAL LOW (ref 136–145)
Total Bilirubin: 0.3 MG/DL (ref 0.2–1.0)
Total Protein: 6.7 g/dL (ref 6.4–8.2)

## 2022-09-25 LAB — EKG 12-LEAD
Atrial Rate: 72 {beats}/min
P Axis: 44 degrees
P-R Interval: 230 ms
Q-T Interval: 398 ms
QRS Duration: 118 ms
QTc Calculation (Bazett): 435 ms
R Axis: 29 degrees
T Axis: 46 degrees
Ventricular Rate: 72 {beats}/min

## 2022-09-25 MED ORDER — IOPAMIDOL 61 % IV SOLN
61 % | INTRAVENOUS | Status: AC
  Administered 2022-09-25: 04:00:00 100

## 2022-09-25 MED ORDER — FERROUS SULFATE 325 (65 FE) MG PO TABS
32565 (65 Fe) MG | Freq: Two times a day (BID) | ORAL | Status: AC
Start: 2022-09-25 — End: 2022-10-04
  Administered 2022-09-25 – 2022-10-04 (×18): 325 mg via ORAL

## 2022-09-25 MED ORDER — DIPHENHYDRAMINE HCL 50 MG/ML IJ SOLN
50 | Freq: Once | INTRAMUSCULAR | Status: AC
Start: 2022-09-25 — End: 2022-09-25
  Administered 2022-09-25: 08:00:00 10 mg via INTRAVENOUS

## 2022-09-25 MED ORDER — DIPHENHYDRAMINE HCL 50 MG PO CAPS
50 | Freq: Three times a day (TID) | ORAL | Status: DC | PRN
Start: 2022-09-25 — End: 2022-09-25

## 2022-09-25 MED ORDER — DIPHENHYDRAMINE HCL 50 MG PO CAPS
50 MG | Freq: Three times a day (TID) | ORAL | Status: AC | PRN
Start: 2022-09-25 — End: 2022-10-04
  Administered 2022-09-25 – 2022-09-28 (×2): 50 mg via ORAL

## 2022-09-25 MED ORDER — DIPHENHYDRAMINE HCL 50 MG PO TABS
50 | Freq: Four times a day (QID) | ORAL | Status: DC | PRN
Start: 2022-09-25 — End: 2022-09-25

## 2022-09-25 MED FILL — DIPHENHYDRAMINE HCL 50 MG PO CAPS: 50 MG | ORAL | Qty: 1

## 2022-09-25 MED FILL — PROMETHAZINE HCL 25 MG PO TABS: 25 MG | ORAL | Qty: 1

## 2022-09-25 MED FILL — CALCIUM ACETATE (PHOS BINDER) 667 MG PO TABS: 667 MG | ORAL | Qty: 1

## 2022-09-25 MED FILL — HYDROMORPHONE HCL 1 MG/ML IJ SOLN: 1 MG/ML | INTRAMUSCULAR | Qty: 1

## 2022-09-25 MED FILL — PANTOPRAZOLE SODIUM 40 MG PO TBEC: 40 MG | ORAL | Qty: 1

## 2022-09-25 MED FILL — AMLODIPINE BESYLATE 5 MG PO TABS: 5 MG | ORAL | Qty: 2

## 2022-09-25 MED FILL — CEFAZOLIN SODIUM 1 G IJ SOLR: 1 g | INTRAMUSCULAR | Qty: 1000

## 2022-09-25 MED FILL — FEROSUL 325 (65 FE) MG PO TABS: 325 (65 Fe) MG | ORAL | Qty: 1

## 2022-09-25 MED FILL — METOPROLOL SUCCINATE ER 25 MG PO TB24: 25 MG | ORAL | Qty: 1

## 2022-09-25 MED FILL — ISOVUE-300 61 % IV SOLN: 61 % | INTRAVENOUS | Qty: 100

## 2022-09-25 MED FILL — HYDRALAZINE HCL 25 MG PO TABS: 25 MG | ORAL | Qty: 1

## 2022-09-25 MED FILL — DIPHENHYDRAMINE HCL 50 MG/ML IJ SOLN: 50 MG/ML | INTRAMUSCULAR | Qty: 1

## 2022-09-25 NOTE — Other (Signed)
09/25/22 0100   Vital Signs   BP (!) 146/82   Temp 97.5 F (36.4 C)   Pulse 69   Respirations 18   SpO2 100 %   Pain Assessment   Pain Assessment None - Denies Pain   Treatment   Time On 0123   Treatment Goal 4Hrs, UF   Observations & Evaluations   Level of Consciousness 0   Oriented X 4   Heart Rhythm Regular   Respiratory Quality/Effort Unlabored   O2 Device None (Room air)   Bilateral Breath Sounds Clear   Skin Color Other (comment)  (appropriate for ethnicity)   Skin Condition/Temp Dry;Warm;Swollen/edematous  (LUE swollen)   Abdomen Inspection Rounded   Edema Left upper extremity   LUE Edema +1   Technical Checks   Dialysis Machine No. 05   RO Machine Number [id number]   Dialyzer Lot No. [id number]   Tubing Lot Number [id number]   All Connections Secure Yes   NS Bag Yes   Saline Line Double Clamped Yes   Dialyzer Revaclear 300   Prime Volume (mL) 250 mL   ICEBOAT I;C;E;B;O;A;T   RO Machine Log Sheet Completed Yes   Machine Alarm Self Test Completed;Passed   Child psychotherapist Function;pH Reading   Sport and exercise psychologist Conductivity 13.9   Manual Ph 7.4   Bleach Test (Neg) Yes   Bath Temperature 96.8 F (36 C)   Dialysis Bath   K+ (Potassium) 3   Ca+ (Calcium) 2.5   Na+ (Sodium) 138   HCO3 (Bicarb) 38   Bicarbonate Concentrate Lot No. [id number]   Acid Concentrate Lot No. [id number]   Hemodialysis Fistula/Graft Arteriovenous fistula Right Arm   Placement Date/Time: 05/07/22 0820   Present on Admission/Arrival: Yes  Access Type: Arteriovenous fistula  Orientation: Right  Access Location: Arm   Site Assessment Clean, dry & intact   Thrill Present   Bruit Present   Status Accessed   Venous Needle Size 15 G   Arterial Needle Size 15 G   Accessed By: S. Clattetrbuck, RN   Access Attempts  1   Date of Last Dressing Change 09/25/22   Access Interventions Chlorhexidine;Aseptic Technique;Needles taped to patient   Dressing Intervention New   Dressing Status New dressing  applied   Dressing Change Due 09/25/22     Primary RN SBAR: A. [place], RN  Patient Education provided: Infection Engineer, civil (consulting). provided: Yes  Preferred Education method and Primary language: Explanation, [facility] associated wait time; reason: None  Hepatitis B Surface Ag   Date/Time Value Ref Range Status   05/07/2022 09:35 PM <0.10 Index Final     Hep B S Ag Interp   Date/Time Value Ref Range Status   05/07/2022 09:35 PM Negative NEG   Final     Hep B S Ab   Date/Time Value Ref Range Status   05/07/2022 09:35 PM 277.97 mIU/mL Final     Hep B S Ab Interp   Date/Time Value Ref Range Status   05/07/2022 09:35 PM REACTIVE (A) NR   Final

## 2022-09-25 NOTE — Progress Notes (Signed)
Vascular Surgery  --patient seen and examined  --seems better?  --reviewed CT--no clear evidence of innominate/SVC occlusion; no enlarged veins proximal to this stent  --left arm with with some improvement  --right arm remains normal  --I think should improve with the IV abx for the bacteremia (probably q HD)  --no vascular intervention anticipated at this time

## 2022-09-25 NOTE — Progress Notes (Signed)
Hospitalist Progress Note  Clearnce Hasten, APRN - NP  Answering service: [phone] OR [phone] from in house phone        Date of Service:  09/25/2022  NAME:  Meredith Hawkins  DOB:  1985-04-16  MRN:  [id number]      Admission Summary:     As per H&P: "Meredith Hawkins is a 37 y.o. 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  Patient noted onset of symptoms starting about 1 week ago.  Symptoms notably have remained constant, severe, aching, aggravated with touch, without alleviating factors.  Patient is resident of [place], [place] but she is visiting here in [place].  Per chart review, patient was recent hospitalized at [facility] on 09/16/2022 complaining of right-sided flank, and swelling and pain of right breast, anemia (transfused 2 units of blood with hemoglobin 6.4); was seen by GI with plan for video capsule endoscopy but patient left AMA.  Over the past year, she has had multiple hospitalizations but each discharge record notes Eliquis on discharge medication list.  There have been no other further reports of GI bleed.  Patient does not complain of any hematemesis, hematochezia, or melena or other bleeding over the past week.  She presented to the ER today with the aforementioned complaints.  She reportedly was  last dialyzed on 09/20/2022 in the ED at outside hospital in [place].  She notes that she does not go to a dialysis center for hemodialysis due to "personal reasons".  Patient went to [facility] ED today where workup included labs showing hemoglobin 7.9, neutrophils 84%, sodium 125, BUN 62, creatinine 11.98, GFR 4, calcium 8.3, albumin 3.0, alkaline phosphatase 135.  12-lead EKG shows sinus rhythm, first-degree AV block at 72 beats minute.  ED ordered Dilaudid 1 mg IV x 2 + 0.5 mg IV x 1, Benadryl 25 mg IV x 1, Protonix 40 mg IV x 1, ceftriaxone 1000 mg IV x 1, and vancomycin 1000 mg IV x 1 dose.  ED performed limited bedside ultrasound of soft tissues left upper extremity showing diffuse cobblestoning consistent with cellulitis but no fluid collection consistent with abscess.  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  ED requested transfer to [facility]. "    Patient reports that she has been banned from all local area dialysis centers in [place] where she lives. She goes to the [name] ER three days a week for her dialysis. She arrived in [place] on Wednesday. Has not had dialysis since last Monday.     Interval history / Subjective:     CT scan of LUE shows no abscess, just an area of infected cellulitis. CT of the chest was also performed with a goal of evaluating breast tissue. Radiologist is recommending outpatient follow up for mammogram.     IF patient needs OP IV abx, will be a challenge as she does not have a set dialysis center.     Repeat BC this evening. Orders placed.  Assessment & Plan:        Cellulitis of left upper extremity  MSSA Bacteremia   -No reported evidence of abscess on bedside ultrasound of left upper extremity ( I do not have access to this ultrasound)   - area of induration to left upper extremity   - CT scan w/wo contrast left upper extremity to evaluate for indurated abscess vs. Infected graft    -paired blood cultures pending   -change IV antibiotic to Ancef , MSSA bacteremia   - repeat blood cultures on 7/7    Right Breast Skin Changes   - chest CT w/wo contrast ordered- consistant with lymphadenopathy of right axilla and inflammatory/infectious skin changes of bilateral breasts which could be mastitis vs. Cellulitis, malignancy cannot be ruled out, therefore mammogram outpatient is recommended   - lymphadenopathy and thickened skin noted   - breast is also significantly larger than left      Acute hyponatremia  -Initial serum sodium 125-----> 128   -Repeat serum sodium proceed toward slow correction  -If able to make any urine, send urine sodium and osmolality     SVC syndrome, ruled out per Vascular Surgery   -History of SVC thrombosis/stent  -Eliquis on hold with patient reporting "dark stools" and vomit with "red blood" today   -Consult vascular surgeon     Edema of both upper extremities  Edema of neck  -plan as above     Anemia of chronic disease  -Hemoglobin 7.9 which is about patient's baseline but increased compared to hemoglobin 6.4 reported on recent hospitalization  -Repeat H&H  -Check iron profile, B12, folate level  -Order fecal occult blood test  -Transfuse if hemoglobin less than 7.0     Generalized abdominal pain  -Received Dilaudid without allergic reaction.  May continue 1 mg IV every 4 hours as needed for severe pain     ESRD (end stage renal disease)  -Reportedly does not follow-up with outpatient nephrologist or dialysis center  -Consult nephrologist (unreferred) for hemodialysis  -Last hemodialysis was on Tuesday, 09/20/2022 per patient report  - will start HD this evening              Code status: Full   Prophylaxis: SCDs  Care Plan discussed with: Patient, RN, Attending, Vascular Surgery   Anticipated Disposition:      Principal Problem:    Cellulitis of left upper extremity  Resolved Problems:    * No resolved hospital problems. *            Review of Systems:   Pertinent items  are noted in HPI.         Vital Signs:    Last 24hrs VS reviewed since prior progress note. Most recent are:  BP 125/66   Pulse 77   Temp 98.6 F (37 C) (Oral)   Resp 18   Wt 80 kg (176 lb 5.9 oz)   SpO2 100%   BMI 28.47 kg/m        Intake/Output Summary (Last 24 hours) at 09/25/2022 2200  Last data filed at 09/25/2022 2003  Gross per 24 hour   Intake 600 ml   Output 3491 ml   Net -2891 ml        Physical Examination:     I had a face to face encounter with this patient and independently examined them on 09/25/2022 as outlined below:          General : alert x 3, awake, no acute distress,  HEENT: PEERL, EOMI, moist mucus membrane  Neck: supple, no JVD, no meningeal signs  Chest: Clear to auscultation bilaterally   Right breast with peau d'orange changes noted to skin, lymphadenopathy to right axilla   CVS: S1 S2 heard, Capillary refill less than 2 seconds  Right fistula with + bruit and + thrill   Abd: soft/ non tender, non distended, BS physiological,   Ext: no clubbing, no cyanosis, no edema, brisk 2+ DP pulses, left upper extremity with purulent drainage from small pinhole , area of induration surrounding, tender to touch   Neuro/Psych: pleasant mood and affect, CN 2-12 grossly intact, sensory grossly within normal limit, Strength 5/5 in all extremities  Skin: warm            Data Review:    Review and/or order of clinical lab test  Review and/or order of tests in the radiology section of CPT  Review and/or order of tests in the medicine section of CPT    I have independently reviewed and interpreted patient's lab and all other diagnostic data    Notes reviewed from all clinical/nonclinical/nursing services involved in patient's clinical care. Care coordination discussions were held with appropriate clinical/nonclinical/ nursing providers based on care coordination needs.     Labs:     Recent Labs     09/24/22  0711 09/25/22  0130   WBC 4.4 3.6   HGB 8.9* 7.4*   HCT 26.7* 22.5*   PLT 204 183       Recent Labs      09/23/22  1251 09/24/22  0711 09/25/22  0130   NA 125* 128* 128*   K 4.3 3.9 4.5   CL 89* 93* 95*   CO2 23 24 24    BUN 62* 67* 75*   PHOS  --  3.4  --        Recent Labs     09/23/22  1251 09/24/22  0711 09/25/22  0130   ALT 9* 14 10*   GLOB 3.7 3.7 3.8       Recent Labs     09/24/22  0711   INR 1.1   APTT 33.8*        No results for input(s): "TIBC" in the last 72 hours.    Invalid input(s): "FE", "PSAT", "FERR"   No results found for: "RBCF"   No results for input(s): "PH", "PCO2", "PO2" in the last 72 hours.  No results for input(s): "CPK" in the last 72 hours.    Invalid input(s): "CPKMB", "CKNDX", "TROIQ"  No results found for: "CHOL", "CHLST", "CHOLV", "HDL", "HDLC", "LDL"  No results found for: "GLUCPOC"        Medications Reviewed:     Current Facility-Administered Medications   Medication Dose Route Frequency    ferrous sulfate (IRON 325) tablet 325 mg  325 mg Oral BID WC    diphenhydrAMINE (BENADRYL) capsule 50 mg  50 mg Oral Q8H PRN    ceFAZolin (ANCEF) 1,000 mg in sterile water 10 mL IV syringe  1,000 mg IntraVENous Q24H    promethazine (PHENERGAN) tablet 25 mg  25 mg Oral Q6H PRN    HYDROmorphone HCl PF (DILAUDID) injection 1 mg  1 mg IntraVENous Q6H PRN    sodium chloride flush 0.9 % injection 5-40 mL  5-40 mL IntraVENous 2 times per day    sodium chloride flush 0.9 % injection 5-40 mL  5-40 mL IntraVENous PRN    0.9 % sodium chloride infusion   IntraVENous  PRN    polyethylene glycol (GLYCOLAX) packet 17 g  17 g Oral Daily PRN    amLODIPine (NORVASC) tablet 10 mg  10 mg Oral Daily    metoprolol succinate (TOPROL XL) extended release tablet 25 mg  25 mg Oral Daily    pantoprazole (PROTONIX) tablet 40 mg  40 mg Oral Daily    hydrALAZINE (APRESOLINE) tablet 25 mg  25 mg Oral TID    calcium acetate (PHOSLO) tablet 667 mg  1 tablet Oral TID WC     ______________________________________________________________________  EXPECTED LENGTH OF STAY: Unable to retrieve estimated LOS  ACTUAL LENGTH OF STAY:           2                 Clearnce Hasten, APRN - NP

## 2022-09-25 NOTE — Other (Signed)
09/25/22 0531   Vital Signs   BP (!) 155/74   Temp 98.1 F (36.7 C)   Pulse 78   Respirations 18   SpO2 100 %   Pain Assessment   Pain Level 9  (Primary nurse aware)   Post-Hemodialysis Assessment   Post-Treatment Procedures Blood returned;Access bleeding time < 10 minutes   Machine Disinfection Process Acid/Vinegar Clean;Heat Disinfect;Exterior Engineer, agricultural Volume (ml) 250 ml   Blood Volume Processed (Liters) 87.8 L   Dialyzer Clearance Lightly streaked   Duration of Treatment (minutes) 240 minutes   Heparin Amount Administered During Treatment (mL) 0 mL   Hemodialysis Intake (ml) 500 ml   Hemodialysis Output (ml) 3491 ml   NET Removed (ml) 2991   Tolerated Treatment Good   Patient Response to Treatment tolerated treatment without incident, UF goal achieved   Bilateral Breath Sounds Clear   Edema Left upper extremity   LUE Edema +1   Physician Notified No   Time Off 0525   Patient Disposition Other (Comment)  (remains in 517)   Observations & Evaluations   Level of Consciousness 0   Oriented X 4   Heart Rhythm Regular   Respiratory Quality/Effort Unlabored   O2 Device None (Room air)   Skin Color Other (comment)  (appropriate for ethnicity)   Skin Condition/Temp Dry;Warm     Primary RN SBAR: A. [place], RN  Comments: Patient tolerated treatment without incident. At the end of treatment, the patient was awake and alert. Needle limbs cleaned, all possible blood returned to patient. Needles removed, manual pressure applied over puncture sites with clean dry folded 2x2 for approximately ten minutes, hemostasis achieved. Puncture sites covered with clean dry folded 2x2, secured with tape. Bed wheels locked, bed in low position, call light within reach.

## 2022-09-25 NOTE — Progress Notes (Signed)
[facility]     Renal Daily Progress Note:     Subjective: Completed dialysis early this AM. Tolerated treatment. No complaints. Surgery advises post dialysis ATB, no surgical intervention right now. She would like to be M-WF  Not SOB, no chest pain, left arm still tender, no N/V or abd pain    Review of Systems  Pertinent items are noted in HPI.    Objective:     BP 130/69   Pulse 77   Temp 98.2 F (36.8 C) (Oral)   Resp 16   Wt 80 kg (176 lb 5.9 oz)   SpO2 100%   BMI 28.47 kg/m   Temp (24hrs), Avg:97.9 F (36.6 C), Min:97.3 F (36.3 C), Max:98.6 F (37 C)        Intake/Output Summary (Last 24 hours) at 09/25/2022 1257  Last data filed at 09/25/2022 0531  Gross per 24 hour   Intake 500 ml   Output 3491 ml   Net -2991 ml     Current Facility-Administered Medications   Medication Dose Route Frequency    ceFAZolin (ANCEF) 1,000 mg in sterile water 10 mL IV syringe  1,000 mg IntraVENous Q24H    promethazine (PHENERGAN) tablet 25 mg  25 mg Oral Q6H PRN    HYDROmorphone HCl PF (DILAUDID) injection 1 mg  1 mg IntraVENous Q6H PRN    sodium chloride flush 0.9 % injection 5-40 mL  5-40 mL IntraVENous 2 times per day    sodium chloride flush 0.9 % injection 5-40 mL  5-40 mL IntraVENous PRN    0.9 % sodium chloride infusion   IntraVENous PRN    polyethylene glycol (GLYCOLAX) packet 17 g  17 g Oral Daily PRN    amLODIPine (NORVASC) tablet 10 mg  10 mg Oral Daily    metoprolol succinate (TOPROL XL) extended release tablet 25 mg  25 mg Oral Daily    pantoprazole (PROTONIX) tablet 40 mg  40 mg Oral Daily    hydrALAZINE (APRESOLINE) tablet 25 mg  25 mg Oral TID    calcium acetate (PHOSLO) tablet 667 mg  1 tablet Oral TID WC       Physical Exam: alert, appears stated age, cooperative, and no distress  Head: Normocephalic, without obvious abnormality, atraumatic  Neck: no JVD and supple, symmetrical, trachea midline  Lungs: clear to auscultation bilaterally  Heart: regular rate and rhythm, no S3 or S4, and 2/6  holosystolic murmur left renal border A2 preserved  Abdomen: soft, non-tender; bowel sounds normal; no masses,  no organomegaly  Extremities:  Trace distal pretibial edema, Right upper extremity AV fistula with good thrill and bruit   Neurologic: Grossly normal     Data Review:     LABS:  Recent Labs     09/25/22  0130 09/24/22  0711 09/23/22  1251   NA 128* 128* 125*   K 4.5 3.9 4.3   CL 95* 93* 89*   CO2 24 24 23    BUN 75* 67* 62*   CREATININE 15.10* 13.50* 11.98*   CALCIUM 8.1* 8.1* 8.3*   PHOS  --  3.4  --      Recent Labs     09/25/22  0130 09/24/22  0711 09/23/22  1251   WBC 3.6 4.4 6.2   HGB 7.4* 8.9* 7.9*   HCT 22.5* 26.7* 23.6*   PLT 183 204 179     Micro: staph aureus in 2/2 bottles (09/23/22)    Radiology  US EXTREMITY LEFT NON VASC  LIMITED    Result Date: 09/25/2022  Cellulitis of the left upper arm without evidence for drainable abscess. Electronically signed by Steele Sizer    CT CHEST W CONTRAST    Result Date: 09/25/2022  Diffuse inflammatory changes throughout both breasts, right greater than left; findings may represent cellulitis/mastitis, although underlying neoplasm is not excluded. Clinical follow-up is recommended to assure complete resolution. If findings persist despite treatment, diagnostic mammography would be recommended. No visualized abscess. Enlarged right axillary lymph nodes may be reactive; clinical follow-up is recommended. Electronically signed by Eual Fines    XR CHEST PORTABLE    Result Date: 09/23/2022  No acute process on portable chest. Electronically signed by Syliva Overman     Assessment:   Renal Specific Problems  CKD stage VI  Hyponatremia- should correct post HD, today's lab pre-HD  Staph aureus bacteremia  Anemia renal failure  Hypertension with renal failure  Left upper extremity swelling, no abscess by ultrasound        Plan:     Obtain/ Order: labs/cultures/radiology/procedures:  check iron sat (no IV Fe with bacteremia). Retacrit if Fe st > 20%        Therapeutic:    HD in  AM, then M-W-F while in house  ATB with HD  Oral iron  IV Fe if iron sat < 20% once bacteremia resolved, Retacrit once Fe sat determined > 20%        Dala Dock, MD    [phone]

## 2022-09-26 ENCOUNTER — Inpatient Hospital Stay: Payer: PRIVATE HEALTH INSURANCE

## 2022-09-26 DIAGNOSIS — L03114 Cellulitis of left upper limb: Secondary | ICD-10-CM

## 2022-09-26 LAB — CULTURE, BLOOD 1

## 2022-09-26 LAB — CULTURE, BLOOD 2

## 2022-09-26 MED FILL — CALCIUM ACETATE (PHOS BINDER) 667 MG PO TABS: 667 MG | ORAL | Qty: 1

## 2022-09-26 MED FILL — HYDRALAZINE HCL 25 MG PO TABS: 25 MG | ORAL | Qty: 1

## 2022-09-26 MED FILL — FEROSUL 325 (65 FE) MG PO TABS: 325 (65 Fe) MG | ORAL | Qty: 1

## 2022-09-26 MED FILL — HYDROMORPHONE HCL 1 MG/ML IJ SOLN: 1 MG/ML | INTRAMUSCULAR | Qty: 1

## 2022-09-26 MED FILL — PROMETHAZINE HCL 25 MG PO TABS: 25 MG | ORAL | Qty: 1

## 2022-09-26 MED FILL — PANTOPRAZOLE SODIUM 40 MG PO TBEC: 40 MG | ORAL | Qty: 1

## 2022-09-26 MED FILL — AMLODIPINE BESYLATE 5 MG PO TABS: 5 MG | ORAL | Qty: 2

## 2022-09-26 MED FILL — METOPROLOL SUCCINATE ER 25 MG PO TB24: 25 MG | ORAL | Qty: 1

## 2022-09-26 MED FILL — CEFAZOLIN SODIUM 1 G IJ SOLR: 1 g | INTRAMUSCULAR | Qty: 1000

## 2022-09-26 NOTE — Progress Notes (Signed)
Echo tech arrived to room to perform echo test. Patient is refusing at this time as dinner is coming soon and she does not want to miss it. She is also concerned if the echo delayed her dinner, that dialysis may come and also delay her dinner.

## 2022-09-26 NOTE — Consults (Signed)
Infectious Disease Consult    INFECTIOUS DISEASE Attending:     I agree with the above infectious disease daily progress note in its entirety as authored by and discussed in detail with the nurse practitioner.   I have reviewed pertinent laboratory studies, microbiology cultures, radiologic reports with review of the consultations and progress notes as appropriate.   I agree with today's subjective findings, physical examination, assessment and plan of care as described above and discussed extensively with the nurse practitioner.       Cefazolin 1g IV q24hrs, dosed after HD on HD days.    Prior procedure to LUE?  Apply hot compresses given lack of abscess at this time - may coalesce to form one.    Suspect RUE AV graft may be infected.    TTE needed.    Repeat blood cultures.    Today's Date: 09/26/2022   Admit Date: 09/23/2022    Date of Consultation:  September 26, 2022  Reason for consult: Bacteremia  Referring Physician: Freddi Starr, NP    HPI:  Patient is a 37 y.o. 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 She lives in [place], currently visiting [place]. Pt was transferred to [facility] for further evaluation.    In ER, temp, wbc 6.2, blood cx grew SA, chest CT revealed Diffuse inflammatory changes throughout both breasts, right greater than left; findings may represent cellulitis/mastitis, although underlying neoplasm is not excluded. Duplex study of upper extremity revealed Cellulitis of the left upper arm without evidence for drainable abscess. Pt was evaluated by vascular surgery team. No surgical intervention at this time.     During visit, pt is c/o LUE swelling and pain. Pt can't remember when she had a bowel movement at this time. She still urinates at times, denies dysuria. No fever,  chills, nausea, vomiting, sob, doe, chest pain, or abd pain.    Denies smoking, alcohol intake, or substance abuse. Pt has been on HD for the past 8 yrs. AVF RUE; + thrill     ID team was consulted for evaluation and treatment recommendations regarding bacteremia.       Impression:   Bacteremia  LUE cellulitis  - afebrile, wbc 3.6     Blood cx (7/5)MSSA, (7/8) to be done    Hx of SVC syndrome  - evaluated by vascular surgery team; no evidence of innominate, SVC occlusion, no enlarged veins proximal to the stent. No surgical intervention at this time.    ESRD on HD  - AVF RUE; + thrill  Plan:     - continue with IV ancef    Repeat BC ordered; to be done    Echo to r/o vegetation    Source of infection; appears to be LUE      ID team will follow  D/w pt about importance obtaining BC, pt acknowledged.   Plan of care d/w pt and Dr. Dagoberto Ligas          Anti-inflectives:   IV rocephin 7/5  IV ancef 7/6-  IV vancomycin 7/5-7/6    Past Medical History:   Diagnosis Date    Asthma     Lupus (HCC)     VSD (ventricular septal defect)       No family history on file.  Social History     Tobacco Use    Smoking status: Never    Smokeless tobacco: Never   Substance Use Topics    Alcohol use: Never       No past surgical history on file.   Prior to Admission medications    Medication Sig Start Date End Date Taking? Authorizing Provider   hydrALAZINE (APRESOLINE) 25 MG tablet Take 1 tablet by mouth 3 times daily 08/03/21 05/27/23 Yes [provider]   metoprolol succinate (TOPROL XL) 25 MG extended release tablet Take 1 tablet by mouth daily 06/11/21 05/27/23 Yes [provider]   pantoprazole (PROTONIX) 40 MG tablet Take 1 tablet by mouth daily 03/26/22 10/07/22 Yes [provider]   amLODIPine (NORVASC) 10 MG tablet Take 1 tablet by mouth daily    [provider]   calcium acetate (PHOSLO) 667 MG CAPS capsule Take 1 capsule by mouth 3 times daily (with meals)    [provider]   apixaban (ELIQUIS)  2.5 MG TABS tablet Take 1 tablet by mouth 2 times daily    [provider]     a  Allergies   Allergen Reactions    Ace Inhibitors Anaphylaxis     Pt states "all of these have closed my throat"    Acetaminophen Anaphylaxis     Pt states "all of these have closed my throat"    Codeine Anaphylaxis     Pt states "all of these have closed my throat"    Fentanyl Anaphylaxis    Hydrocodone Anaphylaxis     Pt states "all of these have closed my throat"    Nsaids Other (See Comments)     pcp states not to take due to kidneys     Oxycodone Anaphylaxis     Pt states "all of these have closed my throat"    Sulfa Antibiotics Anaphylaxis          Subjective:         REVIEW OF SYSTEMS:     Total of 12 systems reviewed as follows:   I am not able to complete the review of systems because:   The patient is intubated and sedated    The patient has altered mental status due to his acute medical problems    The patient has baseline aphasia from prior stroke(s)    The patient has baseline dementia and is not reliable historian                 POSITIVE= underlined text  Negative = text not underlined  General:  fever, chills, sweats, generalized weakness, weight loss/gain,      loss of appetite   Eyes:    blurred vision, eye pain, loss of vision, double vision  ENT:    rhinorrhea, pharyngitis   Respiratory:   cough, sputum production, SOB, wheezing, DOE, pleuritic pain   Cardiology:   chest pain, palpitations, orthopnea, PND, edema, syncope   Gastrointestinal:  abdominal pain , N/V, dysphagia, diarrhea, constipation, bleeding   Genitourinary:  frequency, urgency, dysuria, hematuria, incontinence   Muskuloskeletal :  arthralgia, myalgia LUE pain  Hematology:  easy bruising, nose or gum bleeding, lymphadenopathy   Dermatological: rash, ulceration, pruritis   Endocrine:   hot flashes or polydipsia   Neurological:  headache, dizziness, confusion, focal weakness, paresthesia,     Speech difficulties, memory loss, gait  disturbance  Psychological: Feelings of anxiety, depression, agitation    Objective:     Visit Vitals  BP (!) 144/78   Pulse 68   Temp 98.2 F (36.8 C) (Oral)   Resp 18   Wt 75.4 kg (166 lb 3.6 oz)   SpO2 97%   BMI 26.83 kg/m     Temp (24hrs), Avg:98.3 F (36.8 C), Min:97.7 F (36.5 C), Max:98.6 F (37 C)       Lines:  peripheral line    PHYSICAL EXAM:   General:    Alert, cooperative, no distress, appears stated age.     HEENT: Atraumatic, anicteric sclerae, pink conjunctivae     No oral ulcers, mucosa moist  Neck:  Supple  Lungs:   Clear in apex with decreased breath sounds at bases.  No Wheezing or Rhonchi. No rales.  Chest wall:  No tenderness  No Accessory muscle use. + murmur  Heart:   Regular  rhythm,  No  murmur   ++ edema  Abdomen:   Soft, non-tender. Not distended.  Bowel sounds normal  Extremities: No cyanosis.  No clubbing, RUE; fistula + thrill, LUE; edematous, erythematous, warm and tender to touch  Skin:     Not pale.  Not Jaundiced  No rashes   Psych:  Good insight.  Not depressed.  Provided short phrase answers  Neurologic: EOMs intact. No facial asymmetry. No aphasia or slurred speech. Symmetrical strength, Alert and oriented X 4.       Data Review:     CBC:  Recent Labs     09/23/22  1251 09/24/22  0711 09/25/22  0130   WBC 6.2 4.4 3.6   HGB 7.9* 8.9* 7.4*   HCT 23.6* 26.7* 22.5*   PLT 179 204 183       BMP:  Recent Labs     09/23/22  1251 09/24/22  0711 09/25/22  0130   BUN 62* 67* 75*   NA 125* 128* 128*   K 4.3 3.9 4.5   CL 89* 93* 95*   CO2 23 24 24        LFTS:  Recent Labs     09/23/22  1251 09/24/22  0711 09/25/22  0130   ALT 9* 14 10*       Microbiology:   Repeat BC to be done    Imaging:   XR CHEST PORTABLE 09/23/2022    Narrative  EXAM:  XR CHEST PORTABLE    INDICATION: Missed dialysis, brachiocephalic and right subclavian stent, known  SVC syndrome    COMPARISON: 05/07/2022    TECHNIQUE: 1319 hours portable chest AP view    FINDINGS: The cardiac silhouette is within normal limits.  The stent in the right  subclavian vein and right brachiocephalic vein and SVC is noted. Vascular stents  in the right arm are noted..    The lungs and pleural spaces are clear. The visualized bones and upper abdomen  are age-appropriate.    Impression  No acute process on portable chest.      Electronically signed by Syliva Overman      US EXTREMITY LEFT NON VASC LIMITED    Result Date: 09/25/2022  Ultrasound of the soft tissues. HISTORY: Area of induration just above the left antecubital fossa, where there is an area of drainage. Evaluation for abscess. Targeted ultrasound was performed to the area of interest. There is diffuse skin edema but no drainable abscess. The underlying musculature does not appear involved. No obvious thrombus.     Cellulitis of the left upper arm without evidence for drainable abscess. Electronically signed by Steele Sizer  CT CHEST W CONTRAST    Result Date: 09/25/2022  INDICATION: lymphadenopathy right axilla, right breast skin changes, swelling. Cellulitis of left upper limb. COMPARISON: None TECHNIQUE: Following intravenous administration of 100 cc Isovue-300, 5 mm axial images were obtained through the chest. Coronal and sagittal reformats were generated.  CT dose reduction was achieved through use of a standardized protocol tailored for this examination and automatic exposure control for dose modulation. FINDINGS: CHEST WALL: Diffuse subcutaneous edema edema is again seen in both breasts. There is right greater than left diffuse breast skin thickening. Multiple mildly enlarged right axillary lymph nodes are evident. There is no soft tissue swelling or fluid collection in the left upper extremity, which was imaged through the level of the elbow. THYROID: No nodule. MEDIASTINUM: No mass or lymphadenopathy. Vascular stent is present in the innominate/right subclavian vein HILA: No mass or lymphadenopathy. THORACIC AORTA: No dissection or aneurysm. MAIN PULMONARY ARTERY: Normal in  caliber. TRACHEA/BRONCHI: Patent. ESOPHAGUS: No wall thickening or dilatation. HEART: Normal in size. Coronary artery calcium: Present, mild PLEURA: No effusion or pneumothorax. LUNGS: No nodule, mass, or airspace disease. INCIDENTALLY IMAGED UPPER ABDOMEN: Atrophic kidneys with small bilateral renal cysts. BONES: No destructive bone lesion.     Diffuse inflammatory changes throughout both breasts, right greater than left; findings may represent cellulitis/mastitis, although underlying neoplasm is not excluded. Clinical follow-up is recommended to assure complete resolution. If findings persist despite treatment, diagnostic mammography would be recommended. No visualized abscess. Enlarged right axillary lymph nodes may be reactive; clinical follow-up is recommended. Electronically signed by Eual Fines    XR CHEST PORTABLE    Result Date: 09/23/2022  EXAM:  XR CHEST PORTABLE INDICATION: Missed dialysis, brachiocephalic and right subclavian stent, known SVC syndrome COMPARISON: 05/07/2022 TECHNIQUE: 1319 hours portable chest AP view FINDINGS: The cardiac silhouette is within normal limits. The stent in the right subclavian vein and right brachiocephalic vein and SVC is noted. Vascular stents in the right arm are noted.. The lungs and pleural spaces are clear. The visualized bones and upper abdomen are age-appropriate.     No acute process on portable chest. Electronically signed by Tollie Pizza COLE    XR CHEST (2 VW)    Result Date: 09/12/2022  EXAM: XR CHEST 2 VIEWS ACCESSION: [id number] UN CLINICAL INDICATION: CHEST PAIN   TECHNIQUE: PA and Lateral Chest Radiographs. COMPARISON: Chest radiographs 08/05/2022 FINDINGS: Unchanged vascular stents overlying the right subclavian and superior vena cava. Left renal artery embolization. Surgical clips overlying the right hemithorax. Mild to moderate interstitial pulmonary edema. Small bilateral pleural effusions with associated atelectasis. Unchanged cardiomegaly.    Mild to  moderate interstitial pulmonary edema with small bilateral pleural effusions and associated atelectasis.    XR CHEST (2 VW)    Result Date: 09/05/2022  CHEST 2 VIEWS: HISTORY:  Shortness of breath and chest pain COMPARISON: 08/10/2022 TECHNIQUE: Frontal and lateral views of the chest FINDINGS: Stable cardiomegaly. Vascular congestion with interstitial prominence and thickening of the pulmonary fissures. Trace effusions bilaterally.    IMPRESSION: Interstitial edema with trace effusions.    CT brain without contrast    Result Date: 09/04/2022  CT of the brain without contrast Clinical: Chest pain, headache TECHNIQUE: Axial images obtained from base of skull to the vertex without contrast FINDINGS: There is some hyperdensity to the vessels in general which may be related to retained contrast from prior contrasted study. Otherwise, no hemorrhage, mass effect or edema. IMPRESSION: No definite acute intracranial abnormality. Diffuse intravascular hyperdensity presumably related to  poor clearing of iodinated contrast from prior contrasted study. Electronically Signed by:  Corey Harold, MD, [facility] Electronically Signed on:  09/04/2022 11:41 PM    CT Abdomen and Pelvis With IV Contrast    Result Date: 09/01/2022  CT ABDOMEN AND PELVIS WITH CONTRAST: HISTORY: R flank pain, history of left sided spontaneous renal bleed COMPARISON: 08/10/2022 INTRAVENOUS CONTRAST:  100 mL of Omnipaque 300. TECHNIQUE: Spiral CT performed from the dome of the diaphragm to the pubic symphysis following IV contrast.   DICOM format image data available to non-affiliated external healthcare facilities or entities on a secure, media free, reciprocally searchable basis with patient authorization for at least a 12 month period after the study. Dose reduction technique was used on this scan by utilizing automated exposure control, adjustment of the mA and/or kV according to the patient size. FINDINGS: Scout film: Unremarkable Lung bases: Trace right  pleural effusion. Cardiomegaly. Hepatobiliary: No focal hepatic lesion or intrahepatic biliary dilation. Pancreas normal morphology and duct caliber. Similar 1.5 cm splenic lesion. Incidental splenic lesions are typically benign and vascular in etiology. Hyperdense material in the gallbladder is nonspecific but could reflect early vicarious excretion. GU: Redemonstrated numerous bilateral renal cysts. Redemonstrated probable hemorrhagic cyst in the lower pole of the left kidney with hemorrhage extending into the perinephric space, similar compared to prior but decreased compared to 07/02/2022 no CT evidence of active contrast extravasation.. Numerous bilateral subcentimeter hypodensities, too small to characterize. No obstructive uropathy. Normal adrenal glands. Excreted contrast in the decompressed bladder. GI: No evidence of bowel obstruction. No mesenteric stranding or wall thickening. No intraperitoneal adenopathy, masses or fluid. Colonic diverticulosis. Normal appendix. Retroperitoneum: No retroperitoneal adenopathy. Cardiovascular: Normal caliber aorta. Musculoskeletal: No focal lytic or blastic lesion. No fracture or acute derangement. Diffuse body wall edema. Extensive collaterals in the subcutaneous fat of the anterior abdomen and anterior chest and left flank.    IMPRESSION: Redemonstrated numerous bilateral renal cysts. Redemonstrated probable hemorrhagic cyst in the lower pole of the left kidney with hemorrhage extending into the perinephric space, similar compared to prior but decreased compared to 07/02/2022. No CT evidence of active contrast extravasation. No right-sided perinephric hemorrhage.    X-ray chest PA and lateral    Result Date: 08/31/2022  XR CHEST PA AND LATERAL INDICATION: Lung Aeration, R06.00 Dyspnea, unspecified COMPARISON: 08/26/2022 FINDINGS/IMPRESSION: 1.  Redemonstrated vascular stent overlying the right upper thorax. 2.  Unchanged cardiomediastinal silhouette. 3.  Similar background  of interstitial opacities, favored edema. Increased basilar predominant opacities likely represent a combination of edema, pleural fluid, and scarring/atelectasis. 4.  Small bilateral pleural effusions. No pneumothorax. Electronically Reviewed by:  Consuelo Pandy, MD, [facility] Electronically Reviewed on:  08/31/2022 7:02 AM I have reviewed the images and concur with the above findings. Electronically Signed by:  Berneda Rose, MD, [facility] Electronically Signed on:  08/31/2022 8:40 AM    CT abdomen pelvis with contrast    Result Date: 08/31/2022  CT abdomen and pelvis with IV contrast Comparison:  08/26/2022. Indication:  RLQ abdominal pain, R10.31 Right lower quadrant pain. Technique:  CT imaging was performed of the abdomen and pelvis following the administration of intravenous contrast.  Iodinated contrast was used due to the indications for the examination, to improve disease detection and further define anatomy. Coronal and sagittal reformatted images were generated and reviewed. Findings: - Lower Thorax: Small right pleural effusion with associated dependent linear opacities, favored to represent atelectasis/scarring. Innumerable vascular collaterals within the chest wall. - Liver: Normal in morphology  and enhancement.  No suspicious hepatic masses are identified.  The portal and hepatic veins are patent. - Biliary and Gallbladder: No intrahepatic or extrahepatic bile duct dilatation. The gallbladder is normal in appearance. - Spleen: Stable small hypodense lesion in the spleen, likely benign. - Pancreas: Normal in appearance. - Adrenal Glands: Normal in appearance. - Kidneys: Redemonstrated innumerable bilateral renal cysts, some of which are immediate density and incompletely evaluated, unchanged from prior.. Slight decrease in of left renal subcapsular hematoma, measuring 3 x 1.7 cm (series 3 image 45). No hydronephrosis. - Abdominal and Pelvic Vasculature: No abdominal aortic aneurysm. No significant  atherosclerotic plaque. Unchanged prominent venous collaterals throughout the body wall with asymmetric contrast opacification of the left greater than right common femoral and iliac veins. - Gastrointestinal Tract: No abnormal dilation or wall thickening. Redemonstrated colonic diverticuli without evidence of acute diverticulitis. Normal appendix. - Peritoneum/Mesentery/Retroperitoneum: No free fluid.  No free intraperitoneal air. - Lymph Nodes: No retroperitoneal, mesenteric, pelvic, or inguinal lymphadenopathy.   - Bladder: Normal in appearance. - Pelvic Organs: Unremarkable. - Body Wall: Redemonstrated diffuse body wall collaterals and anasarca. Unchanged small fat-containing umbilical hernia. - Musculoskeletal:  No aggressive appearing osseous lesions. Sequela of renal osteodystrophy. Impression: 1.  No acute intra-abdominal abnormality. 2.  Decreased left renal subcapsular hematoma. 3. Small right pleural effusion. The preliminary report (critical or emergent communication) was reviewed prior to this dictation and there are no critical differences between the preliminary results and the impressions in this final report. Electronically Signed by:  Orlean Bradford, MD, [facility] Electronically Signed on:  08/31/2022 8:24 AM    CT abdomen pelvis with contrast    Result Date: 08/27/2022  CT abdomen and pelvis with IV contrast Comparison:  CT abdomen/pelvis dated 08/03/2022. Indication:  L renal hematoma, here with L flank pain, R10.84 Generalized abdominal pain. Technique:  CT imaging was performed of the abdomen and pelvis following the administration of intravenous contrast.  Iodinated contrast was used due to the indications for the examination, to improve disease detection and further define anatomy. Coronal and sagittal reformatted images were generated and reviewed. Findings: - Lower Thorax: Increased trace bilateral pleural effusions with associated dependent linear opacities, favored to represent  atelectasis/scarring. - Liver: Normal in morphology and enhancement.  No suspicious hepatic masses are identified.  The portal and hepatic veins are patent. - Biliary and Gallbladder: No intrahepatic or extrahepatic bile duct dilatation. The gallbladder is normal in appearance. - Spleen: Stable small hypodense lesion in the spleen, likely benign. - Pancreas: Normal in appearance. - Adrenal Glands: Normal in appearance. - Kidneys: Redemonstrated innumerable bilateral renal cysts, some of which are immediate density and incompletely evaluated. Decreased of left renal subcapsular hematoma, measuring 2.8 x 2.2 cm. No hydronephrosis. - Abdominal and Pelvic Vasculature: No abdominal aortic aneurysm. No significant atherosclerotic plaque. Unchanged prominent venous collaterals throughout the body wall with asymmetric contrast opacification of the left greater than right common femoral and iliac veins. - Gastrointestinal Tract: No abnormal dilation or wall thickening. Redemonstrated colonic diverticuli without evidence of acute diverticulitis. Normal appendix. - Peritoneum/Mesentery/Retroperitoneum: No free fluid.  No free intraperitoneal air. - Lymph Nodes: No retroperitoneal, mesenteric, pelvic, or inguinal lymphadenopathy.   - Bladder: Normal in appearance. - Pelvic Organs: Unremarkable. - Body Wall: Redemonstrated diffuse body wall collaterals and anasarca. Unchanged small fat-containing umbilical hernia. - Musculoskeletal:  No aggressive appearing osseous lesions. Sequela of renal osteodystrophy. Impression: 1.  No acute intra-abdominal abnormality. 2.  Decreased left renal subcapsular hematoma. 3.  Increased  trace bilateral pleural effusions. The preliminary report (critical or emergent communication) was reviewed prior to this dictation and there are no critical differences between the preliminary results and the impressions in this final report. Electronically Reviewed by:  Burman Nieves, MD, [facility]  Electronically Reviewed on:  08/27/2022 11:15 AM I have reviewed the images and concur with the above findings. Electronically Signed by:  Jamal Collin, MD, [facility] Electronically Signed on:  08/27/2022 1:25 PM      Thank you for the opportunity to participate in the care of this patient. Please contact with questions or concerns.     The above plan of care that has been discussed and agreed upon by Dr. Dagoberto Ligas.    Signed By: Madelynn Done, APRN - NP     September 26, 2022      A total time of 60 minutes was spent on today's encounter.  Greater than 50% of the time was spent on the following:  Preparing for visit and chart review.  Obtaining and/or reviewing separately obtained history  Performing a medically appropriate exam and/or evaluation  Counseling and educating a patient/family/caregiver as noted above  Referring and communicating with other professionals (not separately reported)  Independently interpreting results (not separately reported) and communicating results to the patient/family/caregiver  Care coordination (not separately reported) as noted above  Documenting clinical information in the electronic health records (e.g. problem list, visit note) on the day of the encounter

## 2022-09-26 NOTE — Progress Notes (Signed)
Pt refused blood draw after x2 unsucessful attempts by phlebotomist. Pt wants to deffer labs to be done by dialysis. importance of labs and cultures explained to pt, who continued to decline labs at this time/via fresh stick. NP notified of this. No new orders at this time. Will continue to follow plan of care and maintain pt safety.

## 2022-09-26 NOTE — Progress Notes (Signed)
[facility]     Renal Daily Progress Note:     Subjective: Completed dialysis early this AM. Tolerated treatment. No complaints. Surgery advises post dialysis ATB, no surgical intervention right now. She would like to be M-WF  Not SOB, no chest pain, left arm still tender, no N/V or abd pain    7/8-feels ok, no complaints, HD later today    Review of Systems  Pertinent items are noted in HPI.    Objective:     BP (!) 165/88   Pulse 70   Temp 98.4 F (36.9 C) (Oral)   Resp 18   Wt 75.4 kg (166 lb 3.6 oz)   SpO2 91%   BMI 26.83 kg/m   Temp (24hrs), Avg:98.2 F (36.8 C), Min:97.7 F (36.5 C), Max:98.6 F (37 C)        Intake/Output Summary (Last 24 hours) at 09/26/2022 1005  Last data filed at 09/26/2022 0525  Gross per 24 hour   Intake 300 ml   Output 0 ml   Net 300 ml       Current Facility-Administered Medications   Medication Dose Route Frequency    ferrous sulfate (IRON 325) tablet 325 mg  325 mg Oral BID WC    diphenhydrAMINE (BENADRYL) capsule 50 mg  50 mg Oral Q8H PRN    ceFAZolin (ANCEF) 1,000 mg in sterile water 10 mL IV syringe  1,000 mg IntraVENous Q24H    promethazine (PHENERGAN) tablet 25 mg  25 mg Oral Q6H PRN    HYDROmorphone HCl PF (DILAUDID) injection 1 mg  1 mg IntraVENous Q6H PRN    sodium chloride flush 0.9 % injection 5-40 mL  5-40 mL IntraVENous 2 times per day    sodium chloride flush 0.9 % injection 5-40 mL  5-40 mL IntraVENous PRN    0.9 % sodium chloride infusion   IntraVENous PRN    polyethylene glycol (GLYCOLAX) packet 17 g  17 g Oral Daily PRN    amLODIPine (NORVASC) tablet 10 mg  10 mg Oral Daily    metoprolol succinate (TOPROL XL) extended release tablet 25 mg  25 mg Oral Daily    pantoprazole (PROTONIX) tablet 40 mg  40 mg Oral Daily    hydrALAZINE (APRESOLINE) tablet 25 mg  25 mg Oral TID    calcium acetate (PHOSLO) tablet 667 mg  1 tablet Oral TID WC       Physical Exam: alert, appears stated age, cooperative, and no distress  Head: Normocephalic, without obvious  abnormality, atraumatic  Neck: no JVD and supple, symmetrical, trachea midline  Lungs: clear to auscultation bilaterally  Heart: regular rate and rhythm, no S3 or S4, and 2/6 holosystolic murmur left renal border A2 preserved  Abdomen: soft, non-tender; bowel sounds normal; no masses,  no organomegaly  Extremities:  Trace distal pretibial edema, Right upper extremity AV fistula with good thrill and bruit   Neurologic: Grossly normal HD access: AVF with bruit rt wrm    Data Review:     LABS:  Recent Labs     09/25/22  0130 09/24/22  0711 09/23/22  1251   NA 128* 128* 125*   K 4.5 3.9 4.3   CL 95* 93* 89*   CO2 24 24 23    BUN 75* 67* 62*   CREATININE 15.10* 13.50* 11.98*   CALCIUM 8.1* 8.1* 8.3*   PHOS  --  3.4  --        Recent Labs     09/25/22  0130  09/24/22  0711 09/23/22  1251   WBC 3.6 4.4 6.2   HGB 7.4* 8.9* 7.9*   HCT 22.5* 26.7* 23.6*   PLT 183 204 179       Micro: staph aureus in 2/2 bottles (09/23/22)    Radiology  US EXTREMITY LEFT NON VASC LIMITED    Result Date: 09/25/2022  Cellulitis of the left upper arm without evidence for drainable abscess. Electronically signed by Steele Sizer    CT CHEST W CONTRAST    Result Date: 09/25/2022  Diffuse inflammatory changes throughout both breasts, right greater than left; findings may represent cellulitis/mastitis, although underlying neoplasm is not excluded. Clinical follow-up is recommended to assure complete resolution. If findings persist despite treatment, diagnostic mammography would be recommended. No visualized abscess. Enlarged right axillary lymph nodes may be reactive; clinical follow-up is recommended. Electronically signed by Eual Fines    XR CHEST PORTABLE    Result Date: 09/23/2022  No acute process on portable chest. Electronically signed by Syliva Overman     Assessment:   Renal Specific Problems  ESRD on HD  Hyponatremia- should correct post HD  Staph aureus bacteremia  Anemia renal failure  Hypertension with renal failure  Left upper extremity swelling,  no abscess by ultrasound        Plan:     Obtain/ Order: labs/cultures/radiology/procedures:  check iron sat (no IV Fe with bacteremia). Retacrit if Fe st > 20%        Therapeutic:    HD  M-W-F while in house  HD today  No labs today  ATB with HD  Oral iron  IV Fe if iron sat < 20% once bacteremia resolved, Retacrit once Fe sat determined > 20%

## 2022-09-26 NOTE — Progress Notes (Signed)
Patient refused to allow this nurse to do skin assessment. Has a don't want to bothered attitude. Patient did allow this  nurse to listen to heart and lungs thru gown. Sitting up in bed watching something on  her computer.

## 2022-09-26 NOTE — Progress Notes (Signed)
Spiritual Care Assessment/Progress Note  [facility]    Name: Alexiana Morrissette MRN: [id number]    Age: 37 y.o.     Sex: female   Language: English     Date: 09/26/2022            Total Time Calculated: 10 min              Spiritual Assessment begun in [name] 5E2 SURGICAL UNIT  Service Provided For: Patient  Referral/Consult From: Rounding  Encounter Overview/Reason: Initial Encounter    Spiritual beliefs:      []  Involved in a faith tradition/spiritual practice:      []  Supported by a faith community:      []  Claims no spiritual orientation:      []  Seeking spiritual identity:           []  Adheres to an individual form of spirituality:      [x]  Not able to assess:                Identified resources for coping and support system:   Support System: Unknown       []  Prayer                  []  Devotional reading               []  Music                  []  Guided Imagery     []  Pet visits                                        []  Other: (COMMENT)     Specific area/focus of visit   Encounter:    Crisis:    Spiritual/Emotional needs:    Ritual, Rites and Sacraments:    Grief, Loss, and Adjustments:    Ethics/Mediation:    Behavioral Health:    Palliative Care:    Advance Care Planning:           Narrative:   Chaplain met with patient briefly to make her aware that spiritual care is available to her. She declined services at this time.   Spiritual care is available upon referral.  Dot Been, Chaplain Intern  Spiritual Health Services  Paging service: [phone] (PRAY)

## 2022-09-26 NOTE — Wound Image (Signed)
WOCN Note:     New consult for LUE cellulitis   Seen in 517/02     37 y.o. y/o female admitted on 09/23/2022   Admitted for cellulitis of LUE   History of lupus, ESRD wit HD, pancreatitis   WBC = 3.6  No indication for wound culture  On Ancef  Diet: reg           Assessment:   Appropriately conversational, sitting up in bed using her laptop, states it is too early for assessment and asks me not to touch her arm.    She reports no pain and reduction in drainage from her left upper arm.  Mobile and continent.     Darker skin tone does not allow for faint color changes to be seen but her arm does not have calor or induration.  Some lumpiness to inner arm noted with CT scan showing only cellulitis without fluid collection.     Recommendations:    No open wound and no active drainage.  No dressing required.     No concerns to relay to provider.    Will sign off.     Rodman Pickle, BSN, RN, [facility]  Certified Wound, Ostomy, Continence Nurse  office [phone]  Available via Perfect[employer]

## 2022-09-26 NOTE — Progress Notes (Signed)
Hospitalist Progress Note  Clearnce Hasten, APRN - NP  Answering service: [phone] OR [phone] from in house phone        Date of Service:  09/26/2022  NAME:  Meredith Hawkins  DOB:  [birth year]  MRN:  [id number]      Admission Summary:     As per H&P: "Meredith Hawkins is a 37 y.o. 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 ED with multiple complaints including pain and swelling of neck, right breast/ chest, right axilla, right upper arm, left upper arm, drainage from right upper arm wound, abdominal pain, nausea and vomiting.  Patient noted onset of symptoms starting about 1 week ago.  Symptoms notably have remained constant, severe, aching, aggravated with touch, without alleviating factors.  Patient is resident of [place], [place] but she is visiting here in [place].  Per chart review, patient was recent hospitalized at [facility] on 09/16/2022 complaining of right-sided flank, and swelling and pain of right breast, anemia (transfused 2 units of blood with hemoglobin 6.4); was seen by GI with plan for video capsule endoscopy but patient left AMA.  Over the past year, she has had multiple hospitalizations but each discharge record notes Eliquis on discharge medication list.  There have been no other further reports of GI bleed.  Patient does not complain of any hematemesis, hematochezia, or melena or other bleeding over the past week.  She presented to the ER today with the aforementioned complaints.  She reportedly was  last dialyzed on 09/20/2022 in the ED at outside hospital in [place].  She notes that she does not go to a dialysis center for hemodialysis due to "personal reasons".  Patient went to [facility] ED today where workup included labs showing hemoglobin 7.9, neutrophils 84%, sodium 125, BUN 62, creatinine 11.98, GFR 4, calcium 8.3, albumin 3.0, alkaline phosphatase 135.  12-lead EKG shows sinus rhythm, first-degree AV block at 72 beats minute.  ED ordered Dilaudid 1 mg IV x 2 + 0.5 mg IV x 1, Benadryl 25 mg IV x 1, Protonix 40 mg IV x 1, ceftriaxone 1000 mg IV x 1, and vancomycin 1000 mg IV x 1 dose.  ED performed limited bedside ultrasound of soft tissues left upper extremity showing diffuse cobblestoning consistent with cellulitis but no fluid collection consistent with abscess.  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  ED requested transfer to [facility]. "    Patient reports that she has been banned from all local area dialysis centers in [place] where she lives. She goes to the [facility] ER three days a week for her dialysis. She arrived in [place] on Wednesday. Has not had dialysis since last Monday.     Interval history / Subjective:     Patient slightly agitated today. Not allowing staff to assess her. Feels tired of people "mashing on her arm". Would not allow staff to try more than once for blood cultures despite explaining important of obtaining repeat blood cultures. These cannot be pulled of the dialysis.      Assessment & Plan:        Cellulitis of left upper extremity  MSSA Bacteremia   -ultrasound  of LUE obtained which shows cellulitis infection, no drainable abscess   - area of induration to left upper extremity   - CT scan of chest w/wo contrast obtained which confirms no abscess in the LUE  -paired blood cultures 7/5 with reported MSSA   -continue with Ancef  - repeat blood cultures on 7/7 ( not yet completed as patient  has been refusing additional sticks for blood draw)  - Infectious disease consulted and following   - ECHO to rule out vegetation     Right Breast Skin Changes   - chest CT w/wo contrast ordered- consistant with lymphadenopathy of right axilla and inflammatory/infectious skin changes of bilateral breasts which could be mastitis vs. Cellulitis, malignancy cannot be ruled out, therefore mammogram outpatient is recommended   - lymphadenopathy and thickened skin noted   - breast is also significantly larger than left      Acute hyponatremia  -Initial serum sodium 125-----> 128   -Repeat serum sodium proceed toward slow correction  -If able to make any urine, send urine sodium and osmolality     SVC syndrome, ruled out per Vascular Surgery   -History of SVC thrombosis/stent  -Eliquis on hold with patient reporting "dark stools" and vomit with "red blood" today   -Consult vascular surgeon- no surgical interventions needed at this time     Edema of both upper extremities  Edema of neck  -plan as above     Anemia of chronic disease  -Hemoglobin 7.9 which is about patient's baseline but increased compared to hemoglobin 6.4 reported on recent hospitalization  -Repeat H&H  -Check iron profile, B12, folate level  -Order fecal occult blood test  -Transfuse if hemoglobin less than 7.0     Generalized abdominal pain  -Received Dilaudid without allergic reaction.  May continue 1 mg IV every 4 hours as needed for severe pain     ESRD (end stage renal disease)  -Reportedly does not follow-up with outpatient nephrologist or dialysis center  -Consult nephrologist (unreferred) for hemodialysis  -Last hemodialysis was on Tuesday, 09/20/2022 per patient report  - HD ordered here for MWF              Code status: Full   Prophylaxis: SCDs  Care Plan discussed with: Patient, RN, Attending, Vascular Surgery   Anticipated Disposition: TBD     Principal Problem:    Cellulitis of left upper extremity  Resolved Problems:    * No resolved hospital  problems. *            Review of Systems:   Pertinent items are noted in HPI.         Vital Signs:    Last 24hrs VS reviewed since prior progress note. Most recent are:  BP (!) 141/78   Pulse 81   Temp 98.4 F (36.9 C) (Oral)   Resp 18   Wt 75.4 kg (166 lb 3.6 oz)   SpO2 91%   BMI 26.83 kg/m        Intake/Output Summary (Last 24 hours) at 09/26/2022 1425  Last data filed at 09/26/2022 1610  Gross per 24 hour   Intake 300 ml   Output 0 ml   Net 300 ml          Physical Examination:     I had a face to face encounter with this patient and independently examined them on 09/26/2022 as outlined below:          General : alert x 3,  awake, no acute distress,   HEENT: PEERL, EOMI, moist mucus membrane  Neck: supple, no JVD, no meningeal signs  Chest: Clear to auscultation bilaterally   Right breast with peau d'orange changes noted to skin, lymphadenopathy to right axilla   CVS: S1 S2 heard, Capillary refill less than 2 seconds  Right fistula with + bruit and + thrill   Abd: soft/ non tender, non distended, BS physiological,   Ext: no clubbing, no cyanosis, no edema, brisk 2+ DP pulses, left upper extremity with purulent drainage from small pinhole , area of induration surrounding, tender to touch   Neuro/Psych: pleasant mood and affect, CN 2-12 grossly intact, sensory grossly within normal limit, Strength 5/5 in all extremities  Skin: warm            Data Review:    Review and/or order of clinical lab test  Review and/or order of tests in the radiology section of CPT  Review and/or order of tests in the medicine section of CPT    I have independently reviewed and interpreted patient's lab and all other diagnostic data    Notes reviewed from all clinical/nonclinical/nursing services involved in patient's clinical care. Care coordination discussions were held with appropriate clinical/nonclinical/ nursing providers based on care coordination needs.     Labs:     Recent Labs     09/24/22  0711 09/25/22  0130   WBC 4.4 3.6    HGB 8.9* 7.4*   HCT 26.7* 22.5*   PLT 204 183       Recent Labs     09/24/22  0711 09/25/22  0130   NA 128* 128*   K 3.9 4.5   CL 93* 95*   CO2 24 24   BUN 67* 75*   PHOS 3.4  --        Recent Labs     09/24/22  0711 09/25/22  0130   ALT 14 10*   GLOB 3.7 3.8       Recent Labs     09/24/22  0711   INR 1.1   APTT 33.8*          Medications Reviewed:     Current Facility-Administered Medications   Medication Dose Route Frequency    ferrous sulfate (IRON 325) tablet 325 mg  325 mg Oral BID WC    diphenhydrAMINE (BENADRYL) capsule 50 mg  50 mg Oral Q8H PRN    ceFAZolin (ANCEF) 1,000 mg in sterile water 10 mL IV syringe  1,000 mg IntraVENous Q24H    promethazine (PHENERGAN) tablet 25 mg  25 mg Oral Q6H PRN    HYDROmorphone HCl PF (DILAUDID) injection 1 mg  1 mg IntraVENous Q6H PRN    sodium chloride flush 0.9 % injection 5-40 mL  5-40 mL IntraVENous 2 times per day    sodium chloride flush 0.9 % injection 5-40 mL  5-40 mL IntraVENous PRN    0.9 % sodium chloride infusion   IntraVENous PRN    polyethylene glycol (GLYCOLAX) packet 17 g  17 g Oral Daily PRN    amLODIPine (NORVASC) tablet 10 mg  10 mg Oral Daily    metoprolol succinate (TOPROL XL) extended release tablet 25 mg  25 mg Oral Daily    pantoprazole (PROTONIX) tablet 40 mg  40 mg Oral Daily    hydrALAZINE (APRESOLINE) tablet 25 mg  25 mg Oral TID    calcium acetate (PHOSLO) tablet 667 mg  1 tablet Oral TID WC  ______________________________________________________________________  EXPECTED LENGTH OF STAY: Unable to retrieve estimated LOS  ACTUAL LENGTH OF STAY:          3                 Clearnce Hasten, APRN - NP

## 2022-09-26 NOTE — Progress Notes (Addendum)
Lab called with critical from blood culture done on 7/5 Staph Areus both bottles lt forearm. N.P Dowdy perfect served/made aware

## 2022-09-27 ENCOUNTER — Inpatient Hospital Stay: Payer: PRIVATE HEALTH INSURANCE

## 2022-09-27 ENCOUNTER — Inpatient Hospital Stay: Admit: 2022-09-27 | Payer: PRIVATE HEALTH INSURANCE

## 2022-09-27 LAB — RENAL FUNCTION PANEL
Albumin: 3 g/dL — ABNORMAL LOW (ref 3.5–5.0)
Anion Gap: 9 mmol/L (ref 5–15)
BUN/Creatinine Ratio: 4 — ABNORMAL LOW (ref 12–20)
BUN: 51 MG/DL — ABNORMAL HIGH (ref 6–20)
CO2: 27 mmol/L (ref 21–32)
Calcium: 8.3 MG/DL — ABNORMAL LOW (ref 8.5–10.1)
Chloride: 100 mmol/L (ref 97–108)
Creatinine: 11.6 MG/DL — ABNORMAL HIGH (ref 0.55–1.02)
Est, Glom Filt Rate: 4 mL/min/{1.73_m2} — ABNORMAL LOW (ref >60)
Glucose: 84 mg/dL (ref 65–100)
Phosphorus: 4.4 MG/DL (ref 2.6–4.7)
Potassium: 4.6 mmol/L (ref 3.5–5.1)
Sodium: 136 mmol/L (ref 136–145)

## 2022-09-27 LAB — CBC WITH AUTO DIFFERENTIAL
Basophils %: 1 % (ref 0–1)
Basophils Absolute: 0 10*3/uL (ref 0.0–0.1)
Eosinophils %: 13 % — ABNORMAL HIGH (ref 0–7)
Eosinophils Absolute: 0.4 10*3/uL (ref 0.0–0.4)
Hematocrit: 22.5 % — ABNORMAL LOW (ref 35.0–47.0)
Hemoglobin: 7.2 g/dL — ABNORMAL LOW (ref 11.5–16.0)
Immature Granulocytes %: 1 % — ABNORMAL HIGH (ref 0.0–0.5)
Immature Granulocytes Absolute: 0 10*3/uL (ref 0.00–0.04)
Lymphocytes %: 15 % (ref 12–49)
Lymphocytes Absolute: 0.5 10*3/uL — ABNORMAL LOW (ref 0.8–3.5)
MCH: 29.9 PG (ref 26.0–34.0)
MCHC: 32 g/dL (ref 30.0–36.5)
MCV: 93.4 FL (ref 80.0–99.0)
MPV: 9.8 FL (ref 8.9–12.9)
Monocytes %: 10 % (ref 5–13)
Monocytes Absolute: 0.3 10*3/uL (ref 0.0–1.0)
Neutrophils %: 60 % (ref 32–75)
Neutrophils Absolute: 2.2 10*3/uL (ref 1.8–8.0)
Nucleated RBCs: 0 PER 100 WBC
Platelets: 187 10*3/uL (ref 150–400)
RBC: 2.41 M/uL — ABNORMAL LOW (ref 3.80–5.20)
RDW: 15 % — ABNORMAL HIGH (ref 11.5–14.5)
WBC: 3.4 10*3/uL — ABNORMAL LOW (ref 3.6–11.0)
nRBC: 0 10*3/uL (ref 0.00–0.01)

## 2022-09-27 LAB — IRON AND TIBC
Iron % Saturation: 32 % (ref 20–50)
Iron: 58 ug/dL (ref 35–150)
TIBC: 183 ug/dL — ABNORMAL LOW (ref 250–450)

## 2022-09-27 MED ORDER — EPOETIN ALFA 10000 UNIT/ML IJ SOLN
10000 | INTRAMUSCULAR | Status: DC
Start: 2022-09-27 — End: 2022-09-28

## 2022-09-27 MED FILL — PROMETHAZINE HCL 25 MG PO TABS: 25 MG | ORAL | Qty: 1

## 2022-09-27 MED FILL — CALCIUM ACETATE (PHOS BINDER) 667 MG PO TABS: 667 MG | ORAL | Qty: 1

## 2022-09-27 MED FILL — HYDRALAZINE HCL 25 MG PO TABS: 25 MG | ORAL | Qty: 1

## 2022-09-27 MED FILL — CEFAZOLIN SODIUM 1 G IJ SOLR: 1 g | INTRAMUSCULAR | Qty: 1000

## 2022-09-27 MED FILL — PANTOPRAZOLE SODIUM 40 MG PO TBEC: 40 MG | ORAL | Qty: 1

## 2022-09-27 MED FILL — AMLODIPINE BESYLATE 5 MG PO TABS: 5 MG | ORAL | Qty: 2

## 2022-09-27 MED FILL — METOPROLOL SUCCINATE ER 25 MG PO TB24: 25 MG | ORAL | Qty: 1

## 2022-09-27 MED FILL — HYDROMORPHONE HCL 1 MG/ML IJ SOLN: 1 MG/ML | INTRAMUSCULAR | Qty: 1

## 2022-09-27 MED FILL — AMLODIPINE BESYLATE 5 MG PO TABS: 5 MG | ORAL | Qty: 1

## 2022-09-27 MED FILL — FEROSUL 325 (65 FE) MG PO TABS: 325 (65 Fe) MG | ORAL | Qty: 1

## 2022-09-27 NOTE — Other (Signed)
Pre Dialysis:   09/27/22 0735   Treatment   Time On 0735   Time Off 1105   Treatment Goal 3L   Observations & Evaluations   Level of Consciousness 0   Heart Rhythm Regular   Respiratory Quality/Effort Unlabored   O2 Device None (Room air)   Skin Condition/Temp Dry;Warm   Abdomen Inspection Soft   Edema Generalized   Vital Signs   BP (!) 152/76   Temp 97.7 F (36.5 C)   Pulse 81   Respirations 18   Pain Assessment   Pain Assessment None - Denies Pain   Technical Checks   RO Machine Number [id number]   Dialyzer Lot No. [id number]   Tubing Lot Number [id number]   All Connections Secure Yes   NS Bag Yes   Saline Line Double Clamped Yes   Dialyzer Revaclear 300   Prime Volume (mL) 250 mL   ICEBOAT I;C;E;B;O;A;T   RO Machine Log Sheet Completed Yes   Machine Alarm Self Test Completed;Passed   Child psychotherapist Function   Extracorporeal Chemical engineer Conductivity 13.6   Manual Ph 7.4   Bleach Test (Neg) Yes   Bath Temperature 96.8 F (36 C)   Treatment Initiation   Dialyze Hours 3.5   Treatment  Initiation Universal Precautions maintained;Lines secured to patient;Connections secured;Prime given;Venous Parameters set;Arterial Parameters set;IT consultant engaged;Saline line double clamped;Revaclear Dialyzer   During Hemodialysis Assessment   Blood Flow Rate (ml/min) 400 ml/min   Arterial Pressure (mmHg) -140 mmHg   Venous Pressure (mmHg) 150   TMP 60   DFR 600   Access Visible Yes   Ultrafiltration Rate (ml/hr) 1000 ml/hr   Ultrafiltration Removed (ml) 0 ml   Hemodialysis Fistula/Graft Arteriovenous fistula Right Arm   Placement Date/Time: 05/07/22 0820   Present on Admission/Arrival: Yes  Access Type: Arteriovenous fistula  Orientation: Right  Access Location: Arm   Site Assessment Clean, dry & intact   Thrill Present   Bruit Present   Status Accessed   Venous Needle Size 15 G   Arterial Needle Size 15 G   Accessed ByElder Cyphers, RN   Access Attempts  1   Date of Last Dressing Change  09/27/22   Access Interventions Aseptic Technique;Needles taped to patient   Dressing Intervention New   Dressing Status Clean, dry & intact   Dialysis Bath   K+ (Potassium) 2   Ca+ (Calcium) 2.5   Na+ (Sodium) 138   HCO3 (Bicarb) 38   Bicarbonate Concentrate Lot No. [id number]   Acid Concentrate Lot No. [id number]   Primary RN SBAR: Bettye Boeck, RN  Patient Education provided: AVF infection prevention.  Incapacitated Nurse edu. provided: yes  Preferred Education method and Primary language: verbal, [facility] associated wait time; reason: N/A  Hepatitis B Surface Ag   Date/Time Value Ref Range Status   05/07/2022 09:35 PM <0.10 Index Final     Hep B S Ag Interp   Date/Time Value Ref Range Status   05/07/2022 09:35 PM Negative NEG   Final     Hep B S Ab   Date/Time Value Ref Range Status   05/07/2022 09:35 PM 277.97 mIU/mL Final     Hep B S Ab Interp   Date/Time Value Ref Range Status   05/07/2022 09:35 PM REACTIVE (A) NR   Final     Post Dialysis:     09/27/22 1105   Observations & Evaluations   Level of  Consciousness 0   Heart Rhythm Regular   Respiratory Quality/Effort Unlabored   O2 Device None (Room air)   Skin Condition/Temp Dry;Warm   Abdomen Inspection Soft   Edema Generalized   Vital Signs   BP (!) 175/103   Temp 97.7 F (36.5 C)   Pulse 86   Respirations 18   Pain Assessment   Pain Assessment None - Denies Pain   During Hemodialysis Assessment   Blood Flow Rate (ml/min) 400 ml/min   Arterial Pressure (mmHg) -140 mmHg   Venous Pressure (mmHg) 210   TMP 70   DFR 600   Access Visible Yes   Ultrafiltration Rate (ml/hr) 1280 ml/hr   Ultrafiltration Removed (ml) 3935 ml   Hemodialysis Fistula/Graft Arteriovenous fistula Right Arm   Placement Date/Time: 05/07/22 0820   Present on Admission/Arrival: Yes  Access Type: Arteriovenous fistula  Orientation: Right  Access Location: Arm   Site Assessment Clean, dry & intact   Thrill Present   Bruit Present   Status Deaccessed   Dressing Status Clean, dry & intact    Post-Hemodialysis Assessment   Post-Treatment Procedures Blood returned;Access bleeding time < 10 minutes   Art therapist   Rinseback Volume (ml) 250 ml   Blood Volume Processed (Liters) 77.8 L   Dialyzer Clearance Lightly streaked   Duration of Treatment (minutes) 210 minutes   Hemodialysis Intake (ml) 500 ml   Hemodialysis Output (ml) 3935 ml   NET Removed (ml) 3435   Tolerated Treatment Fair       Primary RN SBARCarilyn Goodpasture, RN  Comments: Pre labs drawn from arterial needle prior to starting dialysis.  1610 Dr. Astrid Drafts in to see pt while on dialysis.  Pt tolerated treatment well with no issues.

## 2022-09-27 NOTE — Progress Notes (Addendum)
[facility]     Renal Daily Progress Note:     Subjective: Completed dialysis early this AM. Tolerated treatment. No complaints. Surgery advises post dialysis ATB, no surgical intervention right now. She would like to be M-WF  Not SOB, no chest pain, left arm still tender, no N/V or abd pain    7/8-feels ok, no complaints, HD later today  7/9-seen on hemodialysis.  Blood pressure stable.  Dialysis was changed to this morning due to scheduling issues.    Review of Systems  Pertinent items are noted in HPI.    Objective:     BP (!) 175/103   Pulse 86   Temp 97.7 F (36.5 C)   Resp 18   Wt 77.8 kg (171 lb 8.3 oz)   SpO2 100%   BMI 27.68 kg/m   Temp (24hrs), Avg:97.9 F (36.6 C), Min:97.7 F (36.5 C), Max:98.1 F (36.7 C)        Intake/Output Summary (Last 24 hours) at 09/27/2022 1113  Last data filed at 09/27/2022 1105  Gross per 24 hour   Intake 500 ml   Output 3935 ml   Net -3435 ml       Current Facility-Administered Medications   Medication Dose Route Frequency    ferrous sulfate (IRON 325) tablet 325 mg  325 mg Oral BID WC    diphenhydrAMINE (BENADRYL) capsule 50 mg  50 mg Oral Q8H PRN    ceFAZolin (ANCEF) 1,000 mg in sterile water 10 mL IV syringe  1,000 mg IntraVENous Q24H    promethazine (PHENERGAN) tablet 25 mg  25 mg Oral Q6H PRN    HYDROmorphone HCl PF (DILAUDID) injection 1 mg  1 mg IntraVENous Q6H PRN    sodium chloride flush 0.9 % injection 5-40 mL  5-40 mL IntraVENous 2 times per day    sodium chloride flush 0.9 % injection 5-40 mL  5-40 mL IntraVENous PRN    0.9 % sodium chloride infusion   IntraVENous PRN    polyethylene glycol (GLYCOLAX) packet 17 g  17 g Oral Daily PRN    amLODIPine (NORVASC) tablet 10 mg  10 mg Oral Daily    metoprolol succinate (TOPROL XL) extended release tablet 25 mg  25 mg Oral Daily    pantoprazole (PROTONIX) tablet 40 mg  40 mg Oral Daily    hydrALAZINE (APRESOLINE) tablet 25 mg  25 mg Oral TID    calcium acetate (PHOSLO) tablet 667 mg  1 tablet Oral TID WC        Physical Exam: alert, appears stated age, cooperative, and no distress-HD in progress  Head: Normocephalic, without obvious abnormality, atraumatic  Neck: no JVD and supple, symmetrical, trachea midline  Lungs: clear to auscultation bilaterally  Heart: regular rate and rhythm, no S3 or S4, and 2/6 holosystolic murmur left renal border A2 preserved  Abdomen: soft, non-tender; bowel sounds normal; no masses,  no organomegaly  Extremities:  Trace distal pretibial edema, Right upper extremity AV fistula with good thrill and bruit   Neurologic: Grossly normal   HD access: RT UE AVF with bruit     Data Review:     LABS:  Recent Labs     09/27/22  0708 09/25/22  0130 09/24/22  0711   NA 136 128* 128*   K 4.6 4.5 3.9   CL 100 95* 93*   CO2 27 24 24    BUN 51* 75* 67*   CREATININE 11.60* 15.10* 13.50*   CALCIUM 8.3* 8.1* 8.1*  PHOS 4.4  --  3.4       Recent Labs     09/27/22  0708 09/25/22  0130 09/24/22  0711   WBC 3.4* 3.6 4.4   HGB 7.2* 7.4* 8.9*   HCT 22.5* 22.5* 26.7*   PLT 187 183 204       Micro: staph aureus in 2/2 bottles (09/23/22)    Radiology  US EXTREMITY LEFT NON VASC LIMITED    Result Date: 09/25/2022  Cellulitis of the left upper arm without evidence for drainable abscess. Electronically signed by Steele Sizer    CT CHEST W CONTRAST    Result Date: 09/25/2022  Diffuse inflammatory changes throughout both breasts, right greater than left; findings may represent cellulitis/mastitis, although underlying neoplasm is not excluded. Clinical follow-up is recommended to assure complete resolution. If findings persist despite treatment, diagnostic mammography would be recommended. No visualized abscess. Enlarged right axillary lymph nodes may be reactive; clinical follow-up is recommended. Electronically signed by Eual Fines    XR CHEST PORTABLE    Result Date: 09/23/2022  No acute process on portable chest. Electronically signed by Syliva Overman     Assessment:   Renal Specific Problems  ESRD on HD  Hyponatremia-  should correct post HD  Staph aureus bacteremia  Anemia renal failure-iron sat 32%  Hypertension with renal failure  Left upper extremity swelling/cellulitis.  no abscess by ultrasound        Plan:     HD  M-W-F while in house  HD today-was not done yesterday  ABx  Oral iron  Start Epogen  F/u echo for vegetation  ID following  Vascular input noted

## 2022-09-27 NOTE — Progress Notes (Signed)
Attempted x2 to draw paired blood cultures, unsuccessful. Helmut Muster, PA made aware via perfect serve.

## 2022-09-27 NOTE — Progress Notes (Signed)
[facility]                                                                                          Hospitalist Progress Note  Malachi Pro, PA-C  Answering service: [phone] OR [phone] from in house phone        Date of Service:  09/27/2022  NAME:  Meredith Hawkins  DOB:  03-Apr-1985  MRN:  [id number]       Admission Summary:   As per H&P: Meredith Hawkins is a 37 y.o. 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  Patient noted onset of symptoms starting about 1 week prior to arrival. Symptoms notably have remained constant, severe, aching, aggravated with touch, without alleviating factors. Patient is resident of [place], [place] but she is visiting here in [place].      Per chart review: Patient was recent hospitalized at [facility] on 09/16/2022 complaining of right-sided flank, and swelling and pain of right breast, anemia (transfused 2 units of blood with hemoglobin 6.4); was seen by GI with plan for video capsule endoscopy but patient left AMA.  Over the past year, she has had multiple hospitalizations but each discharge record notes Eliquis on discharge medication list.  There have been no other further reports of GI bleed.  Patient does not complain of any hematemesis, hematochezia, or melena or other bleeding over the past week prior to arrival.  She  presented to the ER on 7/5 with the aforementioned complaints.  She reportedly was last dialyzed on 09/20/2022 in the ED at outside hospital in [place].  She notes that she does not go to a dialysis center for hemodialysis due to "personal reasons".  Patient went to [facility] ED 7/5 where workup included labs showing hemoglobin 7.9, neutrophils 84%, sodium 125, BUN 62, creatinine 11.98, GFR 4, calcium 8.3, albumin 3.0, alkaline phosphatase 135.  12-lead EKG shows sinus rhythm, first-degree AV block at 72 beats minute.  ED ordered Dilaudid 1 mg IV x 2 + 0.5 mg IV x 1, Benadryl 25 mg IV x 1, Protonix 40 mg IV x 1, ceftriaxone 1000 mg IV x 1, and vancomycin 1000 mg IV x 1 dose.  ED performed limited bedside ultrasound of soft tissues left upper extremity showing diffuse cobblestoning consistent with cellulitis but no fluid collection consistent with abscess.  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  ED requested transfer to [facility].     Patient reports that she has been banned from all local area dialysis centers in [place] where she lives. She goes to the [facility]  ER 3x/week for her dialysis. She arrived in [place] on Wednesday, 7/3 Has not had dialysis since last Monday, 7/1.  Interval history / Subjective:   Upon seeing the patient on rounds, the patient was sitting up in bed with headphones on, working on her computer. Upon walking into the room, introducing myself and starting to ask the patient questions, the patient states that I am asking too many questions. Upon asking patient about why she continues to refuse blood cultures, the patient states she "never refused," rather "the nursing staff could not get them because [she] is a hard stick." Patient states she had a bowel movement yesterday that was "dark black and green." When asked if patient has pain, patient stated "yes, in all the usual places." Upon asking specifically  where she stated "all over" and would not specify further. Patient states that she [place], and cannot wait to get back to the [place] area. Patient states she follows up with "all her doctors" in [place]. Patient states that she becomes itchy after her dialysis treatments. Patient denies any other complaints at this time.     Assessment & Plan:     Cellulitis of left upper extremity  MSSA Bacteremia   -Ultrasound of LUE obtained which shows cellulitis infection, no drainable abscess   -Area of induration to left upper extremity   -CT scan of chest w/wo contrast obtained which confirms no abscess in the LUE  -Paired blood cultures 7/5 with reported MSSA   -Continue with Ancef  -Repeat blood cultures on 7/7 (not yet completed as patient has been refusing additional sticks for blood draw)  -Infectious disease consulted and following   -ECHO to rule out vegetation: Per nursing, patient has refused the ECHO three times in a row. I will attempt to convince the patient one more time today and if she refuses again I will remove the order.     Right Breast Skin Changes   -Chest CT w/wo contrast ordered- consistant with lymphadenopathy of right axilla and inflammatory/infectious skin changes of bilateral breasts which could be mastitis vs. cellulitis, malignancy cannot be ruled out, therefore mammogram outpatient is recommended   -Lymphadenopathy and thickened skin noted   -On exam today, right breast larger than left. Patient states this fluctuates and sometimes dialysis helps with swelling.    ESRD (end stage renal disease)  -Reportedly does not follow-up with outpatient nephrologist or dialysis center  -Nephrology following: start epogen  -HD ordered here for MWF     Anemia of chronic disease  -Hemoglobin 7.2 (7/9) which is slightly lower than patient's baseline but increased compared to hemoglobin 6.4 reported on recent hospitalization  -Repeat H&H  -Check iron profile: unremarkable other than slightly decreased  TIBC at 183  -Fecal occult blood test ordered  -Transfuse if hemoglobin less than 7.0     Acute hyponatremia (Resolved)  -Initial serum sodium 125 --> 128. Today (7/9) 136.  -If able to make any urine, send urine sodium and osmolality.     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  May continue 1 mg IV every 4 hours as needed for severe pain  Code status: Full   Prophylaxis: SCDs  Care Plan discussed with: Patient, RN, Attending  Anticipated Disposition: TBD     Principal Problem:    Cellulitis of left upper extremity  Resolved Problems:    * No resolved hospital problems. *    Social Determinants of Health     Tobacco Use: Low Risk  (05/07/2022)    Patient History     Smoking Tobacco Use: Never     Smokeless Tobacco Use: Never     Passive Exposure: Not on file   Alcohol Use: Not At Risk (09/23/2022)    AUDIT-C     Frequency of Alcohol Consumption: Never     Average Number of Drinks: Patient does not drink     Frequency of Binge Drinking: Never   Physicist, medical Strain: Not on file   Food Insecurity: Not on file   Transportation Needs: Not on file   Physical Activity: Not on file   Stress: Not on file   Social Connections: Moderately Integrated (09/27/2022)    Social Connections [facility])     If for any reason you need help with day-to-day activities such as bathing, preparing meals, shopping, managing finances, etc., do you get the help you need?: Not on file   Intimate Partner Violence: Not on file   Depression: Not on file   Housing Stability: Not on file   Interpersonal Safety: Not At Risk (09/23/2022)    Interpersonal Safety Domain Source: IP Abuse Screening     Physical  abuse: Denies     Verbal abuse: Denies     Emotional abuse: Denies     Financial abuse: Denies     Sexual abuse: Denies   Utilities: Not on file       Review of Systems:   Pertinent items are noted in HPI.       Vital Signs:    Last 24hrs VS reviewed since prior progress note. Most recent are:  Vitals:    09/27/22 1105   BP: (!) 175/103   Pulse: 86   Resp: 18   Temp: 97.7 F (36.5 C)   SpO2:          Intake/Output Summary (Last 24 hours) at 09/27/2022 1152  Last data filed at 09/27/2022 1105  Gross per 24 hour   Intake 500 ml   Output 3935 ml   Net -3435 ml        Physical Examination:     I had a face to face encounter with this patient and independently examined them on 09/27/2022 as outlined below:    General : alert x 3, awake, no acute distress  HEENT: PEERL, EOMI, moist mucus membrane  Neck: supple, no JVD  Chest: Clear to auscultation bilaterally   Right breast with peau d'orange changes noted to skin, lymphadenopathy to right axilla   CVS: S1 S2 heard, Capillary refill less than 2 seconds  Right fistula with + bruit and + thrill   Abd: soft/ non tender, non distended  Ext: no clubbing, no cyanosis, no edema, left upper extremity with purulent drainage from small pinhole, area of induration surrounding, tender to touch   Neuro/Psych: pleasant mood and affect, sensory grossly within normal limit, Strength 5/5 in all extremities  Skin: warm     Data Review:    Review and/or order of clinical lab test  Review and/or order of tests in the radiology section of CPT  Review and/or order of tests in the medicine section of CPT  I have personally and independently reviewed all pertinent labs, diagnostic studies, imaging, and have provided independent interpretation of the same.     Labs:     Recent Labs     09/25/22  0130 09/27/22  0708   WBC 3.6 3.4*   HGB 7.4* 7.2*   HCT 22.5* 22.5*   PLT 183 187     Recent Labs     09/25/22  0130 09/27/22  0708   NA 128* 136   K 4.5 4.6   CL 95* 100   CO2 24 27   BUN 75* 51*   PHOS   --  4.4     Recent Labs     09/25/22  0130   ALT 10*   GLOB 3.8     Recent Labs     09/27/22  0708   TIBC 183*     Notes reviewed from all clinical/nonclinical/nursing services involved in patient's clinical care. Care coordination discussions were held with appropriate clinical/nonclinical/ nursing providers based on care coordination needs.     Patients current active Medications were reviewed, considered, added and adjusted based on the clinical condition today.      Home Medications were reconciled to the best of my ability given all available resources at the time of admission. Route is PO if not otherwise noted.    Medications Reviewed:     Current Facility-Administered Medications   Medication Dose Route Frequency    [START ON 09/28/2022] epoetin alfa (EPOGEN;PROCRIT) injection 10,000 Units  10,000 Units SubCUTAneous Once per day on Mon Wed Fri    ferrous sulfate (IRON 325) tablet 325 mg  325 mg Oral BID WC    diphenhydrAMINE (BENADRYL) capsule 50 mg  50 mg Oral Q8H PRN    ceFAZolin (ANCEF) 1,000 mg in sterile water 10 mL IV syringe  1,000 mg IntraVENous Q24H    promethazine (PHENERGAN) tablet 25 mg  25 mg Oral Q6H PRN    HYDROmorphone HCl PF (DILAUDID) injection 1 mg  1 mg IntraVENous Q6H PRN    sodium chloride flush 0.9 % injection 5-40 mL  5-40 mL IntraVENous 2 times per day    sodium chloride flush 0.9 % injection 5-40 mL  5-40 mL IntraVENous PRN    0.9 % sodium chloride infusion   IntraVENous PRN    polyethylene glycol (GLYCOLAX) packet 17 g  17 g Oral Daily PRN    amLODIPine (NORVASC) tablet 10 mg  10 mg Oral Daily    metoprolol succinate (TOPROL XL) extended release tablet 25 mg  25 mg Oral Daily    pantoprazole (PROTONIX) tablet 40 mg  40 mg Oral Daily    hydrALAZINE (APRESOLINE) tablet 25 mg  25 mg Oral TID    calcium acetate (PHOSLO) tablet 667 mg  1 tablet Oral TID WC     ______________________________________________________________________  EXPECTED LENGTH OF STAY: Unable to retrieve estimated  LOS  ACTUAL LENGTH OF STAY:          4                 Malachi Pro, PA-C

## 2022-09-27 NOTE — Care Coordination-Inpatient (Addendum)
Care Management Initial Assessment       RUR: 22% high  Readmission? No  1st IM letter given? NA  1st [employer] letter given: NA    2:57PM: CM met with the patient at the bedside, explained role. Patient states that her emergency contact has changed, she would like Haynes Hoehn her godmom to be her emergency contact (p: [phone]). Patient states that she saw her PCP at [facility] family medicine this past year. Patient states she is independent in everything but she does not drive. Patient would like a ride set up for her to the train station upon DC. Patient states she does not own any DME. She lives in an apartment and states she does not have any stairs. Patient also does not have prior experience with home health, SNF or rehab. CM will follow as needed.     3:15PM: CM called the practice at [facility] family medicine and was told the patient's PCP is Elby Beck (p: [phone]).      09/27/22 1452   Service Assessment   Patient Orientation Alert and Oriented;Person;Place;Situation;Self   Cognition Alert   History Provided By Patient   Primary Caregiver Self   Support Systems Friends/Neighbors  (godmom)   Patient's Healthcare Decision Maker is: Legal Next of Kin   PCP Verified by CM Yes  ([facility] family medicine last saw this year)   Last Visit to PCP Within last year   Prior Functional Level Independent in ADLs/IADLs;Shopping;Assistance with the following:  (does everything except drive)   Current Functional Level Independent in ADLs/IADLs;Assistance with the following:;Shopping   Can patient return to prior living arrangement Yes   Ability to make needs known: Good   Family able to assist with home care needs: No   Would you like for me to discuss the discharge plan with any other family members/significant others, and if so, who? Yes  (only at patient request)   Social/Functional History   Lives With Alone   Type of Home Apartment  (no stairs per patient)   Oceanographer None   Home Equipment None  (does  not own any DME per patient)   ADL Assistance Independent   Barrister's clerk No   Mode of Transportation Other  (patient states she will take the train at DC, and will need a ride to the train station upon DC)   Discharge Planning   Type of Residence Apartment   Living Arrangements Alone   Current Services Prior To Admission None  (goes to ED for HD)   Patient expects to be discharged to: [facility] At/After Discharge   Confirm Follow Up Transport Other (see comment)  (patient states she will need a ride to the train station upon DC)     Marylou Mccoy RN, BSN, [employer]

## 2022-09-28 LAB — ECHO (TTE) COMPLETE (PRN CONTRAST/BUBBLE/STRAIN/3D)
AV Mean Gradient: 6 mmHg
AV Mean Velocity: 1.2 m/s
AV Peak Gradient: 12 mmHg
AV Peak Velocity: 1.8 m/s
AV VTI: 34.3 cm
AV Velocity Ratio: 0.61
Ao Root Index: 1.76 cm/m2
Aortic Root: 3.3 cm
Ascending Aorta Index: 1.71 cm/m2
Ascending Aorta: 3.2 cm
Body Surface Area: 1.91 m2
E/E' Lateral: 7.08
E/E' Ratio (Averaged): 8.26
E/E' Septal: 9.44
EF BP: 68 % (ref 55–100)
IVSd: 1.2 cm — AB (ref 0.6–0.9)
LA Diameter: 4.3 cm
LA Size Index: 2.3 cm/m2
LA Volume A-L A4C: 102 mL — AB (ref 22–52)
LA Volume A-L A4C: 120 mL — AB (ref 22–52)
LA Volume A/L: 114 mL
LA Volume Index A-L A2C: 55 mL/m2 — AB (ref 16–34)
LA Volume Index A-L A4C: 64 mL/m2 — AB (ref 16–34)
LA Volume Index A/L: 61 mL/m2 (ref 16–34)
LA Volume Index MOD A2C: 52 ml/m2 — AB (ref 16–34)
LA Volume Index MOD A4C: 58 ml/m2 — AB (ref 16–34)
LA Volume MOD A2C: 98 mL — AB (ref 22–52)
LA Volume MOD A4C: 109 mL — AB (ref 22–52)
LA/AO Root Ratio: 1.3
LV E' Lateral Velocity: 12 cm/s
LV E' Septal Velocity: 9 cm/s
LV EDV A2C: 203 mL
LV EDV A4C: 194 mL
LV EDV BP: 200 mL — AB (ref 56–104)
LV EDV Index A2C: 109 mL/m2
LV EDV Index A4C: 104 mL/m2
LV EDV Index BP: 107 mL/m2
LV ESV A2C: 57 mL
LV ESV A4C: 71 mL
LV ESV BP: 64 mL — AB (ref 19–49)
LV ESV Index A2C: 30 mL/m2
LV ESV Index A4C: 38 mL/m2
LV ESV Index BP: 34 mL/m2
LV Ejection Fraction A2C: 72 %
LV Ejection Fraction A4C: 63 %
LVIDd: 1.3 cm — AB (ref 3.9–5.3)
LVIDd: 4.3 cm (ref 3.9–5.3)
LVIDs Index: 1.5 cm/m2
LVIDs: 2.8 cm
LVOT Mean Gradient: 3 mmHg
LVOT Peak Gradient: 5 mmHg
LVOT Peak Velocity: 1.1 m/s
LVOT VTI: 22.5 cm
LVOT:AV VTI Index: 0.66
LVPWd: 1.2 cm — AB (ref 0.6–0.9)
MV A Velocity: 0.92 m/s
MV Area by PHT: 2.9 cm2
MV E Velocity: 0.85 m/s
MV E Wave Deceleration Time: 265.4 ms
MV E/A: 0.92
MV Max Velocity: 1 m/s
MV Mean Gradient: 2 mmHg
MV Mean Velocity: 0.7 m/s
MV PHT: 77 ms
MV Peak Gradient: 4 mmHg
MV VTI: 24.8 cm
MV:LVOT VTI Index: 1.1
PV Max Velocity: 1.8 m/s
PV Peak Gradient: 13 mmHg
RVIDd: 4.6 cm
TAPSE: 2.3 cm (ref 1.7–?)
TR Max Velocity: 2.69 m/s
TR Peak Gradient: 29 mmHg

## 2022-09-28 MED ORDER — EPOETIN ALFA-EPBX 10000 UNIT/ML IJ SOLN
10000 | INTRAMUSCULAR | Status: DC
Start: 2022-09-28 — End: 2022-10-04
  Administered 2022-09-28 – 2022-10-03 (×3): 10000 [IU] via SUBCUTANEOUS

## 2022-09-28 MED ORDER — EPOETIN ALFA 10000 UNIT/ML IJ SOLN
10000 | INTRAMUSCULAR | Status: DC
Start: 2022-09-28 — End: 2022-09-28

## 2022-09-28 MED ORDER — HYDROMORPHONE HCL PF 1 MG/ML IJ SOLN
1 | Freq: Four times a day (QID) | INTRAMUSCULAR | Status: DC | PRN
Start: 2022-09-28 — End: 2022-10-04
  Administered 2022-09-28 – 2022-10-04 (×22): 0.5 mg via INTRAVENOUS

## 2022-09-28 MED FILL — HYDROMORPHONE HCL 1 MG/ML IJ SOLN: 1 MG/ML | INTRAMUSCULAR | Qty: 1

## 2022-09-28 MED FILL — DIPHENHYDRAMINE HCL 50 MG PO CAPS: 50 MG | ORAL | Qty: 1

## 2022-09-28 MED FILL — FEROSUL 325 (65 FE) MG PO TABS: 325 (65 Fe) MG | ORAL | Qty: 1

## 2022-09-28 MED FILL — PANTOPRAZOLE SODIUM 40 MG PO TBEC: 40 MG | ORAL | Qty: 1

## 2022-09-28 MED FILL — HYDRALAZINE HCL 25 MG PO TABS: 25 MG | ORAL | Qty: 1

## 2022-09-28 MED FILL — PROCRIT 10000 UNIT/ML IJ SOLN: 10000 UNIT/ML | INTRAMUSCULAR | Qty: 1

## 2022-09-28 MED FILL — RETACRIT 10000 UNIT/ML IJ SOLN: 10000 UNIT/ML | INTRAMUSCULAR | Qty: 1

## 2022-09-28 MED FILL — CALCIUM ACETATE (PHOS BINDER) 667 MG PO TABS: 667 MG | ORAL | Qty: 1

## 2022-09-28 MED FILL — CEFAZOLIN SODIUM 1 G IJ SOLR: 1 g | INTRAMUSCULAR | Qty: 1000

## 2022-09-28 MED FILL — PROMETHAZINE HCL 25 MG PO TABS: 25 MG | ORAL | Qty: 1

## 2022-09-28 MED FILL — METOPROLOL SUCCINATE ER 25 MG PO TB24: 25 MG | ORAL | Qty: 1

## 2022-09-28 MED FILL — AMLODIPINE BESYLATE 5 MG PO TABS: 5 MG | ORAL | Qty: 2

## 2022-09-28 NOTE — Progress Notes (Signed)
Vascular access called by this nurse regarding blood cultures written to be drawn on Monday July 8th. Patient has been refusing blood draws and will only allow vascular access to attempt blood draws.

## 2022-09-28 NOTE — Progress Notes (Signed)
1324M: asked pt if okay to draw lab, pt refuses. Offered pt option to have vascular nurse draw lab; pt aggrees but has to be around lunch.    0102V: Picc nurse seen on floor and informed that pt okays to have lab drawn around lunchtime.

## 2022-09-28 NOTE — Progress Notes (Signed)
[facility]                                                                                          Hospitalist Progress Note  Malachi Pro, PA-C  Answering service: [phone] OR [phone] from in house phone        Date of Service:  09/28/2022  NAME:  Meredith Hawkins  DOB:  09/25/1985  MRN:  [id number]       Admission Summary:   As per H&P: Meredith Hawkins is a 37 y.o. 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  Patient noted onset of symptoms starting about 1 week prior to arrival. Symptoms notably have remained constant, severe, aching, aggravated with touch, without alleviating factors. Patient is resident of [place], [place] but she is visiting here in [place].       Per chart review: Patient was recent hospitalized at [facility] on 09/16/2022 complaining of right-sided flank, and swelling and pain of right breast, anemia (transfused 2 units of blood with hemoglobin 6.4); was seen by GI with plan for video capsule endoscopy but patient left AMA.  Over the past year, she has had multiple hospitalizations but each discharge record notes Eliquis on discharge medication list.  There have been no other further reports of GI bleed.  Patient does not complain of any hematemesis, hematochezia, or melena or other bleeding over the past week prior to arrival.  She  presented to the ER on 7/5 with the aforementioned complaints.  She reportedly was last dialyzed on 09/20/2022 in the ED at outside hospital in [place].  She notes that she does not go to a dialysis center for hemodialysis due to "personal reasons".  Patient went to [facility] ED 7/5 where workup included labs showing hemoglobin 7.9, neutrophils 84%, sodium 125, BUN 62, creatinine 11.98, GFR 4, calcium 8.3, albumin 3.0, alkaline phosphatase 135.  12-lead EKG shows sinus rhythm, first-degree AV block at 72 beats minute.  ED ordered Dilaudid 1 mg IV x 2 + 0.5 mg IV x 1, Benadryl 25 mg IV x 1, Protonix 40 mg IV x 1, ceftriaxone 1000 mg IV x 1, and vancomycin 1000 mg IV x 1 dose.  ED performed limited bedside ultrasound of soft tissues left upper extremity showing diffuse cobblestoning consistent with cellulitis but no fluid collection consistent with abscess.  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  ED requested transfer to [facility].     Patient reports that she has been banned from all local area dialysis centers in [place] where she lives. She goes to the  [facility] 3x/week for her dialysis. She arrived in [place] on Wednesday, 7/3 Has not had dialysis since last Monday, 7/1.  Interval history / Subjective:   Upon seeing the patient on rounds this morning, the patient was sitting up in bed on her computer. Patient states she is in the same amount of pain she is always in. Patient states she is less itchy today. I explained to the patient that the vascular access team would be by today to try and get blood cultures, which she was okay with. Additionally, if the vascular access team had no success I explained we would consider an EJ. She consented to that as well. However, she does not want morning labs. Risks were discussed with the patient. Patient states her bilateral breasts feel better today, with her left being slightly larger  than the right today. Patient states she will be seen in [place] to follow up with this. Patient denies any other complaints.     Assessment & Plan:     Cellulitis of left upper extremity  MSSA Bacteremia   -Ultrasound of LUE obtained which shows cellulitis infection, no drainable abscess   -Area of induration to left upper extremity   -CT scan of chest w/wo contrast obtained which confirms no abscess in the LUE  -Paired blood cultures 7/5 with reported MSSA   -Continue with Ancef  -Repeat blood cultures on 7/7 (not yet completed as patient has been refusing additional sticks for blood draw)  --> will discuss with vascular team to come and get [facility]  -Infectious disease consulted and following   -ECHO to rule out vegetation: ECHO negative for vegetation    Right Breast Skin Changes   -Chest CT w/wo contrast ordered- consistant with lymphadenopathy of right axilla and inflammatory/infectious skin changes of bilateral breasts which could be mastitis vs. cellulitis, malignancy cannot be ruled out, therefore mammogram outpatient is recommended   -Lymphadenopathy and thickened skin noted   -On exam today, left breast larger than right. Patient states this fluctuates and sometimes dialysis helps with swelling.     ESRD (end stage renal disease)  -Reportedly does not follow-up with outpatient nephrologist or dialysis center  -Nephrology following: start epogen  -HD ordered here for MWF      Anemia of chronic disease  -Last Hemoglobin 7.2 (7/9) which is slightly lower than patient's baseline but increased compared to hemoglobin 6.4 reported on recent hospitalization. Patient refused labs this AM (7/10)  -Repeat H&H if patient will allow.  -Check iron profile: unremarkable other than slightly decreased TIBC at 183  -Fecal occult blood test ordered  -Transfuse if hemoglobin less than 7.0     Acute hyponatremia (Resolved)  -Initial serum sodium 125 --> 128. (7/9) 136.  -If able to make any urine, send urine sodium and osmolality.      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  Reduced to 0.5 mg IV every 4 hours as needed for severe pain     Code status: Full   Prophylaxis: SCDs  Care Plan discussed with: Patient, RN, Attending  Anticipated Disposition: TBD     Principal Problem:  Cellulitis of left upper extremity  Resolved Problems:    * No resolved hospital problems. *    Social Determinants of Health     Tobacco Use: Low Risk  (05/07/2022)    Patient History     Smoking Tobacco Use: Never     Smokeless Tobacco Use: Never     Passive Exposure: Not on file   Alcohol Use: Not At Risk (09/23/2022)    AUDIT-C     Frequency of Alcohol Consumption: Never     Average Number of Drinks: Patient does not drink     Frequency of Binge Drinking: Never   Physicist, medical Strain: Not on file   Food Insecurity: Not on file   Transportation Needs: Not on file   Physical Activity: Not on file   Stress: Not on file   Social Connections: Moderately Integrated (09/28/2022)    Social Connections [facility])     If for any reason you need help with day-to-day activities such as bathing, preparing meals, shopping, managing finances, etc., do you get the help you need?: Not on file   Intimate Partner Violence: Not on file   Depression: Not on file   Housing Stability: Not on file   Interpersonal Safety: Not At Risk (09/23/2022)    Interpersonal Safety Domain Source: IP Abuse Screening     Physical abuse: Denies     Verbal abuse: Denies     Emotional abuse: Denies     Financial abuse: Denies     Sexual abuse: Denies   Utilities: Not on file       Review of Systems:   Pertinent items are noted in HPI.       Vital Signs:    Last  24hrs VS reviewed since prior progress note. Most recent are:  Vitals:    09/28/22 0600   BP:    Pulse: 82   Resp:    Temp:    SpO2:          Intake/Output Summary (Last 24 hours) at 09/28/2022 0740  Last data filed at 09/27/2022 1105  Gross per 24 hour   Intake 500 ml   Output 3935 ml   Net -3435 ml        Physical Examination:     I had a face to face encounter with this patient and independently examined them on 09/28/2022 as outlined below:    General : alert x 3, awake, no acute distress  HEENT: PEERL, EOMI, moist mucus membrane  Neck: supple, no JVD  Chest: Clear to auscultation bilaterally   Right breast with peau d'orange changes noted to skin, lymphadenopathy to right axilla, right breast larger than left today  CVS: S1 S2 heard, Capillary refill less than 2 seconds  Right fistula with + bruit and + thrill   Abd: soft/ non tender, non distended  Ext: no clubbing, no cyanosis, no edema, left upper extremity with purulent drainage from small pinhole, area of induration surrounding, tender to touch   Neuro/Psych: pleasant mood and affect, sensory grossly within normal limit, Strength 5/5 in all extremities  Skin: warm     Data Review:    Review and/or order of clinical lab test  Review and/or order of tests in the radiology section of CPT  Review and/or order of tests in the medicine section of CPT      I have personally and independently reviewed all pertinent labs, diagnostic studies, imaging, and have provided independent interpretation of the same.  Labs:     Recent Labs     09/27/22  0708   WBC 3.4*   HGB 7.2*   HCT 22.5*   PLT 187     Recent Labs     09/27/22  0708   NA 136   K 4.6   CL 100   CO2 27   BUN 51*   PHOS 4.4     Recent Labs     09/27/22  0708   TIBC 183*     Notes reviewed from all clinical/nonclinical/nursing services involved in patient's clinical care. Care coordination discussions were held with appropriate clinical/nonclinical/ nursing providers based on care coordination needs.      Patients current active Medications were reviewed, considered, added and adjusted based on the clinical condition today.      Home Medications were reconciled to the best of my ability given all available resources at the time of admission. Route is PO if not otherwise noted.    Medications Reviewed:     Current Facility-Administered Medications   Medication Dose Route Frequency    epoetin alfa (EPOGEN;PROCRIT) injection 10,000 Units  10,000 Units SubCUTAneous Once per day on Mon Wed Fri    ferrous sulfate (IRON 325) tablet 325 mg  325 mg Oral BID WC    diphenhydrAMINE (BENADRYL) capsule 50 mg  50 mg Oral Q8H PRN    ceFAZolin (ANCEF) 1,000 mg in sterile water 10 mL IV syringe  1,000 mg IntraVENous Q24H    promethazine (PHENERGAN) tablet 25 mg  25 mg Oral Q6H PRN    HYDROmorphone HCl PF (DILAUDID) injection 1 mg  1 mg IntraVENous Q6H PRN    sodium chloride flush 0.9 % injection 5-40 mL  5-40 mL IntraVENous 2 times per day    sodium chloride flush 0.9 % injection 5-40 mL  5-40 mL IntraVENous PRN    0.9 % sodium chloride infusion   IntraVENous PRN    polyethylene glycol (GLYCOLAX) packet 17 g  17 g Oral Daily PRN    amLODIPine (NORVASC) tablet 10 mg  10 mg Oral Daily    metoprolol succinate (TOPROL XL) extended release tablet 25 mg  25 mg Oral Daily    pantoprazole (PROTONIX) tablet 40 mg  40 mg Oral Daily    hydrALAZINE (APRESOLINE) tablet 25 mg  25 mg Oral TID    calcium acetate (PHOSLO) tablet 667 mg  1 tablet Oral TID WC     ______________________________________________________________________  EXPECTED LENGTH OF STAY: Unable to retrieve estimated LOS  ACTUAL LENGTH OF STAY:          5                 Malachi Pro, PA-C

## 2022-09-28 NOTE — Other (Incomplete)
Post hd note   09/28/22 1530   Vital Signs   BP (!) 159/82   Temp 98.4 F (36.9 C)   Pulse 71   Respirations 18   Pain Assessment   Pain Assessment 0-10   Pain Level 5   Pain Location Arm   Pain Orientation Left   Post-Hemodialysis Assessment   Post-Treatment Procedures Blood returned;Access bleeding time < 10 minutes   Machine Disinfection Process Acid/Vinegar Clean;Heat Disinfect;Exterior Engineer, agricultural Volume (ml) 200 ml   Blood Volume Processed (Liters) 74.8 L   Dialyzer Clearance Lightly streaked   Duration of Treatment (minutes) 210 minutes   Heparin Amount Administered During Treatment (mL) 0 mL   Hemodialysis Intake (ml) 500 ml   Hemodialysis Output (ml) 3500 ml   NET Removed (ml) 3000   Tolerated Treatment Fair   Patient Response to Treatment   (Pt. tol. hd without incident)   Bilateral Breath Sounds Clear   Edema Left upper extremity   RLE Edema Trace   LLE Edema Trace   Time Off 1530   Observations & Evaluations   Level of Consciousness 0   Oriented X 4   Heart Rhythm Regular   Respiratory Quality/Effort Unlabored   O2 Device None (Room air)     RN completed patient assessment. RN reviewed technicians vital signs and procedure note. Tx completed. Reviewed by RN Feliz Beam Blosser  Pt. Tol. Hd without incident, no s/s of distress. All possible blood returned to pt. Report given to primary nurse Phillis Haggis RN

## 2022-09-28 NOTE — Progress Notes (Signed)
Called earlier by [place], [place] to assist in obtaining labs. Attempted x 2, patient refusing, wants labs drawn at lunchtime. RN to call [facility] [phone] for assistance.

## 2022-09-28 NOTE — Progress Notes (Signed)
ID Progress Note          Admission Summary:   37 y.o. 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 She lives in [place], currently visiting [place]. Pt was transferred to [facility] for further evaluation.     In ER, temp, wbc 6.2, blood cx grew SA, chest CT revealed Diffuse inflammatory changes throughout both breasts, right greater than left; findings may represent cellulitis/mastitis, although underlying neoplasm is not excluded. Duplex study of upper extremity revealed Cellulitis of the left upper arm without evidence for drainable abscess. Pt was evaluated by vascular surgery team. No surgical intervention at this time.      During visit, pt is c/o LUE swelling and pain. Pt can't remember when she had a bowel movement at this time. She still urinates at times, denies dysuria. No fever, chills, nausea, vomiting, sob, doe, chest pain, or abd pain.     Denies smoking, alcohol intake, or substance abuse. Pt has been on HD for the past 8 yrs. AVF RUE; + thrill      ID team was consulted for evaluation and treatment recommendations regarding bacteremia.         Interval history    Consult date 7/8    Pt has been reminded about importance of repeat [facility] which she agreed, vascular access team has been contacted by primary team for repeat [facility]    Antimicrobial therapy history      ancef  Assessment   Bacteremia  LUE cellulitis  - afebrile, wbc 3.6     Blood cx (7/5)MSSA, (7/8) to be done     TTE (7/9) normal valvular structure  Hx of SVC syndrome  - evaluated by vascular surgery team; no evidence of innominate, SVC occlusion, no enlarged veins proximal to the stent. No surgical intervention at this time.     ESRD on HD  - AVF RUE; + thrill  Plan   - continue with IV ancef    Repeat BC ordered; to  be done    Source of infection; appears to be LUE and ? AVF      ID team will follow  D/w pt about importance obtaining BC, pt acknowledged.   Plan of care d/w pt and Dr. Dagoberto Ligas    Subjective:     No new discomfort    Review of Systems:            Symptom Y/N Comments   Symptom Y/N Comments   Fever/Chills n      Chest Pain n       Poor Appetite       Edema        Cough       Abdominal Pain n       Sputum       Joint Pain        SOB/DOE n      Pruritis/Rash        Nausea/vomit n      Tolerating PT/OT        Diarrhea n      Tolerating Diet        Constipation       Other           Could NOT obtain due to:  Objective:     Vitals: BP 138/77   Pulse 82   Temp 98.6 F (37 C) (Oral)   Resp 18   Wt 74.6 kg (164 lb 7.4 oz)   SpO2 99%   BMI 26.55 kg/m      Tmax:  Temp (24hrs), Avg:98.2 F (36.8 C), Min:97.7 F (36.5 C), Max:98.6 F (37 C)      PHYSICAL EXAM:  General: WD, WN. Alert, cooperative, no acute distress    EENT:  EOMI. Anicteric sclerae. MMM  Resp:  RA O2 sat 96%,  No accessory muscle use  CV:  Regular  rhythm,  No edema  GI:  Soft, Non distended, Non tender.  +Bowel sounds  Neurologic:  Alert and oriented X 3, normal speech,   Psych:   Fair insight. Not anxious nor agitated  Skin:  No rashes.  No jaundice  LUE; edematous, AVF; + thrill      Data Review:     CBC:  Recent Labs     09/27/22  0708   WBC 3.4*   HGB 7.2*   HCT 22.5*   PLT 187       BMP:  Recent Labs     09/27/22  0708   BUN 51*   NA 136   K 4.6   CL 100   CO2 27       LFTS:  No results for input(s): "ALT", "TP" in the last 72 hours.    Invalid input(s): "TBILI", "SGOT", "AP", "ALB"    Microbiology:   @MICRORESULTS @    Cultures:   Results       Procedure Component Value Units Date/Time    Culture, Blood 2 [[id number]]     Order Status: Sent Specimen: Blood     Culture, Blood 1 [[id number]]     Order Status: Sent Specimen: Blood     Culture, Blood 2 [[id number]]     Order Status: Canceled Specimen: Blood     Culture, Blood 1 [[id number]]      Order Status: Canceled Specimen: Blood     Culture, Blood 2 [[id number]]     Order Status: Canceled Specimen: Blood     Culture, Blood 2 [[id number]]     Order Status: Canceled Specimen: Blood     Culture, Blood 1 [[id number]]     Order Status: Canceled Specimen: Blood     Blood Culture 2 [[id number]]  (Abnormal)  (Susceptibility) Collected: 09/23/22 1317    Order Status: Completed Specimen: Blood Updated: 09/26/22 1015     Special Requests --        NO SPECIAL REQUESTS  LEFT  FOREARM       Culture       Staphylococcus aureus GROWING IN BOTH BOTTLES DRAWN SITE = L FA          Susceptibility        Staphylococcus aureus      BACTERIAL SUSCEPTIBILITY PANEL MIC      ciprofloxacin <=0.5 ug/mL Sensitive      clindamycin 0.25 ug/mL Sensitive      DAPTOmycin 0.25 ug/mL Sensitive      doxycycline <=0.5 ug/mL Sensitive      erythromycin >=8 ug/mL Resistant      gentamicin <=0.5 ug/mL Sensitive      levofloxacin 0.25 ug/mL Sensitive      linezolid 4 ug/mL Sensitive      moxifloxacin <=0.25 ug/mL Sensitive      oxacillin 2 ug/mL Sensitive      rifampin <=0.5 ug/mL Sensitive  [  1]       tetracycline <=1 ug/mL Sensitive      trimethoprim-sulfamethoxazole >=320 ug/mL Resistant      vancomycin <=0.5 ug/mL Sensitive                   [1]  Rifampin is not to be used for mono-therapy.                   Culture, Blood, PCR ID Panel [[id number]]  (Abnormal) Collected: 09/23/22 1317    Order Status: Completed Specimen: Blood Updated: 09/24/22 0930     Accession Number [id number]     Enterococcus faecalis by PCR Not detected        Enterococcus faecium by PCR Not detected        Listeria monocytogenes by PCR Not detected        STAPHYLOCOCCUS Detected        Staphylococcus Aureus Detected        Staphylococcus epidermidis by PCR Not detected        Staphylococcus lugdunensis by PCR Not detected        STREPTOCOCCUS Not detected        Streptococcus agalactiae (Group B) Not detected        Strep pneumoniae Not detected        Strep  pyogenes,(Grp. A) Not detected        Acinetobacter calcoac baumannii complex by PCR Not detected        Bacteroides fragilis by PCR Not detected        Enterobacteriaceae by PCR Not detected        Enterobacter cloacae complex by PCR Not detected        Escherichia Coli Not detected        Klebsiella aerogenes by PCR Not detected        Klebsiella oxytoca by PCR Not detected        Klebsiella pneumoniae group by PCR Not detected        Proteus by PCR Not detected        Salmonella species by PCR Not detected        Serratia marcescens by PCR Not detected        Haemophilus Influenzae by PCR Not detected        Neisseria meningitidis by PCR Not detected        Pseudomonas aeruginosa Not detected        Stenotrophomonas maltophilia by PCR Not detected        Candida albicans by PCR Not detected        Candida auris by PCR Not detected        Candida glabrata Not detected        Candida krusei by PCR Not detected        Candida parapsilosis by PCR Not detected        Candida tropicalis by PCR Not detected        Cryptococcus neoformans/gattii by PCR Not detected        Resistant gene targets          Resistant gene meca/c & mrej by PCR Not detected        [employer] test comment       False positive results may rarely occur. Correlate with clinical,epidemiologic, and other laboratory findings           Comment: Please see BCID Interpretation Guide in EPIC Links       Blood Culture 1 [[id number]]  (  Abnormal) Collected: 09/23/22 1251    Order Status: Completed Specimen: Blood Updated: 09/26/22 1016     Special Requests --        NO SPECIAL REQUESTS  LEFT  FOREARM       Culture       Staphylococcus aureus GROWING IN 1 OF 2 BOTTLES DRAWN SITE = LFA REFER TO [id number] FOR SENSITIVITIES.                   Labs:   Labs:   Lab Results   Component Value Date/Time    WBC 3.4 09/27/2022 07:08 AM    HGB 7.2 09/27/2022 07:08 AM    HCT 22.5 09/27/2022 07:08 AM    PLT 187 09/27/2022 07:08 AM    MCV 93.4 09/27/2022 07:08 AM     Lab  Results   Component Value Date/Time    NA 136 09/27/2022 07:08 AM    K 4.6 09/27/2022 07:08 AM    CL 100 09/27/2022 07:08 AM    CO2 27 09/27/2022 07:08 AM    BUN 51 09/27/2022 07:08 AM    GFRAA 6 05/24/2019 09:30 AM    GLOB 3.8 09/25/2022 01:30 AM    ALT 10 09/25/2022 01:30 AM         Images:     XR CHEST PORTABLE 09/23/2022    Narrative  EXAM:  XR CHEST PORTABLE    INDICATION: Missed dialysis, brachiocephalic and right subclavian stent, known  SVC syndrome    COMPARISON: 05/07/2022    TECHNIQUE: 1319 hours portable chest AP view    FINDINGS: The cardiac silhouette is within normal limits. The stent in the right  subclavian vein and right brachiocephalic vein and SVC is noted. Vascular stents  in the right arm are noted..    The lungs and pleural spaces are clear. The visualized bones and upper abdomen  are age-appropriate.    Impression  No acute process on portable chest.      Electronically signed by Syliva Overman      US EXTREMITY LEFT NON VASC LIMITED    Result Date: 09/25/2022  Ultrasound of the soft tissues. HISTORY: Area of induration just above the left antecubital fossa, where there is an area of drainage. Evaluation for abscess. Targeted ultrasound was performed to the area of interest. There is diffuse skin edema but no drainable abscess. The underlying musculature does not appear involved. No obvious thrombus.     Cellulitis of the left upper arm without evidence for drainable abscess. Electronically signed by Steele Sizer    CT CHEST W CONTRAST    Result Date: 09/25/2022  INDICATION: lymphadenopathy right axilla, right breast skin changes, swelling. Cellulitis of left upper limb. COMPARISON: None TECHNIQUE: Following intravenous administration of 100 cc Isovue-300, 5 mm axial images were obtained through the chest. Coronal and sagittal reformats were generated.  CT dose reduction was achieved through use of a standardized protocol tailored for this examination and automatic exposure control for dose  modulation. FINDINGS: CHEST WALL: Diffuse subcutaneous edema edema is again seen in both breasts. There is right greater than left diffuse breast skin thickening. Multiple mildly enlarged right axillary lymph nodes are evident. There is no soft tissue swelling or fluid collection in the left upper extremity, which was imaged through the level of the elbow. THYROID: No nodule. MEDIASTINUM: No mass or lymphadenopathy. Vascular stent is present in the innominate/right subclavian vein HILA: No mass or lymphadenopathy. THORACIC AORTA: No dissection or aneurysm. MAIN PULMONARY  ARTERY: Normal in caliber. TRACHEA/BRONCHI: Patent. ESOPHAGUS: No wall thickening or dilatation. HEART: Normal in size. Coronary artery calcium: Present, mild PLEURA: No effusion or pneumothorax. LUNGS: No nodule, mass, or airspace disease. INCIDENTALLY IMAGED UPPER ABDOMEN: Atrophic kidneys with small bilateral renal cysts. BONES: No destructive bone lesion.     Diffuse inflammatory changes throughout both breasts, right greater than left; findings may represent cellulitis/mastitis, although underlying neoplasm is not excluded. Clinical follow-up is recommended to assure complete resolution. If findings persist despite treatment, diagnostic mammography would be recommended. No visualized abscess. Enlarged right axillary lymph nodes may be reactive; clinical follow-up is recommended. Electronically signed by Eual Fines    XR CHEST PORTABLE    Result Date: 09/23/2022  EXAM:  XR CHEST PORTABLE INDICATION: Missed dialysis, brachiocephalic and right subclavian stent, known SVC syndrome COMPARISON: 05/07/2022 TECHNIQUE: 1319 hours portable chest AP view FINDINGS: The cardiac silhouette is within normal limits. The stent in the right subclavian vein and right brachiocephalic vein and SVC is noted. Vascular stents in the right arm are noted.. The lungs and pleural spaces are clear. The visualized bones and upper abdomen are age-appropriate.     No acute  process on portable chest. Electronically signed by Tollie Pizza COLE    XR CHEST (2 VW)    Result Date: 09/12/2022  EXAM: XR CHEST 2 VIEWS ACCESSION: [id number] UN CLINICAL INDICATION: CHEST PAIN   TECHNIQUE: PA and Lateral Chest Radiographs. COMPARISON: Chest radiographs 08/05/2022 FINDINGS: Unchanged vascular stents overlying the right subclavian and superior vena cava. Left renal artery embolization. Surgical clips overlying the right hemithorax. Mild to moderate interstitial pulmonary edema. Small bilateral pleural effusions with associated atelectasis. Unchanged cardiomegaly.    Mild to moderate interstitial pulmonary edema with small bilateral pleural effusions and associated atelectasis.    XR CHEST (2 VW)    Result Date: 09/05/2022  CHEST 2 VIEWS: HISTORY:  Shortness of breath and chest pain COMPARISON: 08/10/2022 TECHNIQUE: Frontal and lateral views of the chest FINDINGS: Stable cardiomegaly. Vascular congestion with interstitial prominence and thickening of the pulmonary fissures. Trace effusions bilaterally.    IMPRESSION: Interstitial edema with trace effusions.    CT brain without contrast    Result Date: 09/04/2022  CT of the brain without contrast Clinical: Chest pain, headache TECHNIQUE: Axial images obtained from base of skull to the vertex without contrast FINDINGS: There is some hyperdensity to the vessels in general which may be related to retained contrast from prior contrasted study. Otherwise, no hemorrhage, mass effect or edema. IMPRESSION: No definite acute intracranial abnormality. Diffuse intravascular hyperdensity presumably related to poor clearing of iodinated contrast from prior contrasted study. Electronically Signed by:  Corey Harold, MD, [facility] Electronically Signed on:  09/04/2022 11:41 PM    CT Abdomen and Pelvis With IV Contrast    Result Date: 09/01/2022  CT ABDOMEN AND PELVIS WITH CONTRAST: HISTORY: R flank pain, history of left sided spontaneous renal bleed COMPARISON:  08/10/2022 INTRAVENOUS CONTRAST:  100 mL of Omnipaque 300. TECHNIQUE: Spiral CT performed from the dome of the diaphragm to the pubic symphysis following IV contrast.   DICOM format image data available to non-affiliated external healthcare facilities or entities on a secure, media free, reciprocally searchable basis with patient authorization for at least a 12 month period after the study. Dose reduction technique was used on this scan by utilizing automated exposure control, adjustment of the mA and/or kV according to the patient size. FINDINGS: Scout film: Unremarkable Lung bases: Trace right pleural effusion. Cardiomegaly. Hepatobiliary: No  focal hepatic lesion or intrahepatic biliary dilation. Pancreas normal morphology and duct caliber. Similar 1.5 cm splenic lesion. Incidental splenic lesions are typically benign and vascular in etiology. Hyperdense material in the gallbladder is nonspecific but could reflect early vicarious excretion. GU: Redemonstrated numerous bilateral renal cysts. Redemonstrated probable hemorrhagic cyst in the lower pole of the left kidney with hemorrhage extending into the perinephric space, similar compared to prior but decreased compared to 07/02/2022 no CT evidence of active contrast extravasation.. Numerous bilateral subcentimeter hypodensities, too small to characterize. No obstructive uropathy. Normal adrenal glands. Excreted contrast in the decompressed bladder. GI: No evidence of bowel obstruction. No mesenteric stranding or wall thickening. No intraperitoneal adenopathy, masses or fluid. Colonic diverticulosis. Normal appendix. Retroperitoneum: No retroperitoneal adenopathy. Cardiovascular: Normal caliber aorta. Musculoskeletal: No focal lytic or blastic lesion. No fracture or acute derangement. Diffuse body wall edema. Extensive collaterals in the subcutaneous fat of the anterior abdomen and anterior chest and left flank.    IMPRESSION: Redemonstrated numerous bilateral renal  cysts. Redemonstrated probable hemorrhagic cyst in the lower pole of the left kidney with hemorrhage extending into the perinephric space, similar compared to prior but decreased compared to 07/02/2022. No CT evidence of active contrast extravasation. No right-sided perinephric hemorrhage.    X-ray chest PA and lateral    Result Date: 08/31/2022  XR CHEST PA AND LATERAL INDICATION: Lung Aeration, R06.00 Dyspnea, unspecified COMPARISON: 08/26/2022 FINDINGS/IMPRESSION: 1.  Redemonstrated vascular stent overlying the right upper thorax. 2.  Unchanged cardiomediastinal silhouette. 3.  Similar background of interstitial opacities, favored edema. Increased basilar predominant opacities likely represent a combination of edema, pleural fluid, and scarring/atelectasis. 4.  Small bilateral pleural effusions. No pneumothorax. Electronically Reviewed by:  Consuelo Pandy, MD, [facility] Electronically Reviewed on:  08/31/2022 7:02 AM I have reviewed the images and concur with the above findings. Electronically Signed by:  Berneda Rose, MD, [facility] Electronically Signed on:  08/31/2022 8:40 AM    CT abdomen pelvis with contrast    Result Date: 08/31/2022  CT abdomen and pelvis with IV contrast Comparison:  08/26/2022. Indication:  RLQ abdominal pain, R10.31 Right lower quadrant pain. Technique:  CT imaging was performed of the abdomen and pelvis following the administration of intravenous contrast.  Iodinated contrast was used due to the indications for the examination, to improve disease detection and further define anatomy. Coronal and sagittal reformatted images were generated and reviewed. Findings: - Lower Thorax: Small right pleural effusion with associated dependent linear opacities, favored to represent atelectasis/scarring. Innumerable vascular collaterals within the chest wall. - Liver: Normal in morphology and enhancement.  No suspicious hepatic masses are identified.  The portal and hepatic veins are patent. - Biliary and  Gallbladder: No intrahepatic or extrahepatic bile duct dilatation. The gallbladder is normal in appearance. - Spleen: Stable small hypodense lesion in the spleen, likely benign. - Pancreas: Normal in appearance. - Adrenal Glands: Normal in appearance. - Kidneys: Redemonstrated innumerable bilateral renal cysts, some of which are immediate density and incompletely evaluated, unchanged from prior.. Slight decrease in of left renal subcapsular hematoma, measuring 3 x 1.7 cm (series 3 image 45). No hydronephrosis. - Abdominal and Pelvic Vasculature: No abdominal aortic aneurysm. No significant atherosclerotic plaque. Unchanged prominent venous collaterals throughout the body wall with asymmetric contrast opacification of the left greater than right common femoral and iliac veins. - Gastrointestinal Tract: No abnormal dilation or wall thickening. Redemonstrated colonic diverticuli without evidence of acute diverticulitis. Normal appendix. - Peritoneum/Mesentery/Retroperitoneum: No free fluid.  No free intraperitoneal  air. - Lymph Nodes: No retroperitoneal, mesenteric, pelvic, or inguinal lymphadenopathy.   - Bladder: Normal in appearance. - Pelvic Organs: Unremarkable. - Body Wall: Redemonstrated diffuse body wall collaterals and anasarca. Unchanged small fat-containing umbilical hernia. - Musculoskeletal:  No aggressive appearing osseous lesions. Sequela of renal osteodystrophy. Impression: 1.  No acute intra-abdominal abnormality. 2.  Decreased left renal subcapsular hematoma. 3. Small right pleural effusion. The preliminary report (critical or emergent communication) was reviewed prior to this dictation and there are no critical differences between the preliminary results and the impressions in this final report. Electronically Signed by:  Orlean Bradford, MD, [facility] Electronically Signed on:  08/31/2022 8:24 AM      Thank you for the opportunity to participate in the care of this patient.   Please contact with  questions or concerns.      The above plan of care that has been discussed and agreed upon by Dr. Dagoberto Ligas.    Bernetha Anschutz, APRN - NP    A total time of 35 minutes was spent on today's encounter.  Greater than 50% of the time was spent on the following:  Preparing for visit and chart review.  Obtaining and/or reviewing separately obtained history  Performing a medically appropriate exam and/or evaluation  Counseling and educating a patient/family/caregiver as noted above  Referring and communicating with other professionals (not separately reported)  Independently interpreting results (not separately reported) and communicating results to the patient/family/caregiver  Care coordination (not separately reported) as noted above  Documenting clinical information in the electronic health records (e.g. problem list, visit note) on the day of the encounter

## 2022-09-28 NOTE — Progress Notes (Addendum)
[facility]     Renal Daily Progress Note:     Subjective: Completed dialysis early this AM. Tolerated treatment. No complaints. Surgery advises post dialysis ATB, no surgical intervention right now. She would like to be M-WF  Not SOB, no chest pain, left arm still tender, no N/V or abd pain    7/8-feels ok, no complaints, HD later today  7/9-seen on hemodialysis.  Blood pressure stable.  Dialysis was changed to this morning due to scheduling issues.  7/10-no new complaints.  No labs done this morning looks like patient refused    Review of Systems  Pertinent items are noted in HPI.    Objective:     BP (!) 147/97   Pulse 77   Temp 98.8 F (37.1 C) (Oral)   Resp 15   Wt 74.6 kg (164 lb 7.4 oz)   SpO2 96%   BMI 26.55 kg/m   Temp (24hrs), Avg:98.4 F (36.9 C), Min:97.7 F (36.5 C), Max:98.8 F (37.1 C)        Intake/Output Summary (Last 24 hours) at 09/28/2022 1040  Last data filed at 09/27/2022 1105  Gross per 24 hour   Intake 500 ml   Output 3935 ml   Net -3435 ml       Current Facility-Administered Medications   Medication Dose Route Frequency    HYDROmorphone HCl PF (DILAUDID) injection 0.5 mg  0.5 mg IntraVENous Q6H PRN    epoetin alfa (EPOGEN;PROCRIT) injection 10,000 Units  10,000 Units SubCUTAneous Once per day on Mon Wed Fri    ferrous sulfate (IRON 325) tablet 325 mg  325 mg Oral BID WC    diphenhydrAMINE (BENADRYL) capsule 50 mg  50 mg Oral Q8H PRN    ceFAZolin (ANCEF) 1,000 mg in sterile water 10 mL IV syringe  1,000 mg IntraVENous Q24H    promethazine (PHENERGAN) tablet 25 mg  25 mg Oral Q6H PRN    sodium chloride flush 0.9 % injection 5-40 mL  5-40 mL IntraVENous 2 times per day    sodium chloride flush 0.9 % injection 5-40 mL  5-40 mL IntraVENous PRN    0.9 % sodium chloride infusion   IntraVENous PRN    polyethylene glycol (GLYCOLAX) packet 17 g  17 g Oral Daily PRN    amLODIPine (NORVASC) tablet 10 mg  10 mg Oral Daily    metoprolol succinate (TOPROL XL) extended release tablet 25 mg   25 mg Oral Daily    pantoprazole (PROTONIX) tablet 40 mg  40 mg Oral Daily    hydrALAZINE (APRESOLINE) tablet 25 mg  25 mg Oral TID    calcium acetate (PHOSLO) tablet 667 mg  1 tablet Oral TID WC       Physical Exam: alert, appears stated age, cooperative, and no distress  Head: Normocephalic, without obvious abnormality, atraumatic  Neck: no JVD   Lungs: CTA  Heart: regular rate and rhythm, no S3 or S4, and 2/6 holosystolic murmur left renal border A2 preserved  Abdomen: soft, non-tender; bowel sounds normal; no masses,  no organomegaly  Extremities:  No edema, Right upper extremity AV fistula with thrill and bruit   Neurologic: Grossly normal   HD access: RT UE AVF with bruit     Data Review:     LABS:  Recent Labs     09/27/22  0708 09/25/22  0130 09/24/22  0711   NA 136 128* 128*   K 4.6 4.5 3.9   CL 100 95* 93*   CO2  27 24 24    BUN 51* 75* 67*   CREATININE 11.60* 15.10* 13.50*   CALCIUM 8.3* 8.1* 8.1*   PHOS 4.4  --  3.4       Recent Labs     09/27/22  0708 09/25/22  0130 09/24/22  0711   WBC 3.4* 3.6 4.4   HGB 7.2* 7.4* 8.9*   HCT 22.5* 22.5* 26.7*   PLT 187 183 204       Micro: staph aureus in 2/2 bottles (09/23/22)    Radiology  US EXTREMITY LEFT NON VASC LIMITED    Result Date: 09/25/2022  Cellulitis of the left upper arm without evidence for drainable abscess. Electronically signed by Steele Sizer    CT CHEST W CONTRAST    Result Date: 09/25/2022  Diffuse inflammatory changes throughout both breasts, right greater than left; findings may represent cellulitis/mastitis, although underlying neoplasm is not excluded. Clinical follow-up is recommended to assure complete resolution. If findings persist despite treatment, diagnostic mammography would be recommended. No visualized abscess. Enlarged right axillary lymph nodes may be reactive; clinical follow-up is recommended. Electronically signed by Eual Fines    XR CHEST PORTABLE    Result Date: 09/23/2022  No acute process on portable chest. Electronically signed by  Syliva Overman     Assessment:   Renal Specific Problems  ESRD on HD  Hyponatremia- should correct post HD  Staph aureus bacteremia-echo negative for vegetation  Anemia renal failure-iron sat 32%  Hypertension with renal failure  Left upper extremity swelling/cellulitis.  no abscess by ultrasound        Plan:     HD  M-W-F while in house  HD today  ABx  Oral iron  Started Epogen  ID following  Vascular input noted

## 2022-09-28 NOTE — Progress Notes (Signed)
Pt refused AM labs

## 2022-09-28 NOTE — Other (Signed)
Pre hd note   09/28/22 1200   Vital Signs   BP (!) 150/82   Temp 97.7 F (36.5 C)   Pulse 80   Respirations 16   SpO2 100 %   Pain Assessment   Pain Assessment 0-10   Pain Level 8   Pain Location Arm   Pain Orientation Left   Treatment   Time On 1200   Treatment Goal 2500   Observations & Evaluations   Level of Consciousness 0   Oriented X 4   Heart Rhythm Regular   Respiratory Quality/Effort Unlabored   O2 Device None (Room air)   Bilateral Breath Sounds Clear   Skin Condition/Temp Dry;Warm   Edema Left upper extremity   LUE Edema +1   Technical Checks   Dialysis Machine No. 09   RO Machine Number [id number]   Dialyzer Lot No. [id number]   Tubing Lot Number [id number]   All Connections Secure Yes   NS Bag Yes   Saline Line Double Clamped Yes   Dialyzer Revaclear 300   Prime Volume (mL) 300 mL   ICEBOAT I;C;E;B;O;A;T   RO Machine Log Sheet Completed Yes   Machine Alarm Self Test Completed;English as a second language teacher Conductivity 14.0   Machine Ph 7.2   Manual Ph 7.4   Bleach Test (Neg) Yes   Bath Temperature 98.1 F (36.7 C)   Dialysis Bath   K+ (Potassium) 2   Ca+ (Calcium) 2.5   Na+ (Sodium) 138   HCO3 (Bicarb) 38   Bicarbonate Concentrate Lot No. [id number]   Acid Concentrate Lot No. [id number]   Treatment Initiation   Dialyze Hours 3.5   Treatment  Initiation Universal Precautions maintained;Lines secured to patient;Connections secured;Prime given;Venous Parameters set;Arterial Parameters set;Air foam detector engaged;Dialysate;Saline line double clamped;Hemo-Safe Applied;Dialyzer;Revaclear Dialyzer;Kidney;REV-300   Hemodialysis Fistula/Graft Arteriovenous fistula Right Arm   Placement Date/Time: 05/07/22 0820   Present on Admission/Arrival: Yes  Access Type: Arteriovenous fistula  Orientation: Right  Access Location: Arm   Site Assessment Clean, dry & intact   Thrill Present   Bruit Present   Status Deaccessed   Venous Needle Size 15 G   Arterial Needle Size 15 G   Accessed ByRegis Bill    Date of Last Dressing Change 09/27/22   Access Interventions Chlorhexidine;Aseptic Technique;Needles taped to patient   Dressing Intervention Removed;New   Dressing Status Clean, dry & intact   Dressing Change Due 09/29/22   RN completed patient assessment. RN reviewed technicians vital signs and procedure note. Tx completed. Reviewed by RN Berenice Primas  Primary RN SBAR: Winfred Burn   Patient Education provided: access care  Incapacitated Nurse edu. provided: yes  Preferred Education method and Primary language: [facility] associated wait time; reason: No wait time  Hepatitis B Surface Ag   Date/Time Value Ref Range Status   05/07/2022 09:35 PM <0.10 Index Final     Hep B S Ag Interp   Date/Time Value Ref Range Status   05/07/2022 09:35 PM Negative NEG   Final     Hep B S Ab   Date/Time Value Ref Range Status   05/07/2022 09:35 PM 277.97 mIU/mL Final     Hep B S Ab Interp   Date/Time Value Ref Range Status   05/07/2022 09:35 PM REACTIVE (A) NR   Final         Post hd note    09/28/22 1530   Vital Signs   BP (!) 159/82  Temp 98.4 F (36.9 C)   Pulse 71   Respirations 18   Pain Assessment   Pain Assessment 0-10   Pain Level 5   Pain Location Arm   Pain Orientation Left   Post-Hemodialysis Assessment   Post-Treatment Procedures Blood returned;Access bleeding time < 10 minutes   Machine Disinfection Process Acid/Vinegar Clean;Heat Disinfect;Exterior Engineer, agricultural Volume (ml) 200 ml   Blood Volume Processed (Liters) 74.8 L   Dialyzer Clearance Lightly streaked   Duration of Treatment (minutes) 210 minutes   Heparin Amount Administered During Treatment (mL) 0 mL   Hemodialysis Intake (ml) 500 ml   Hemodialysis Output (ml) 3500 ml   NET Removed (ml) 3000   Tolerated Treatment Fair   Patient Response to Treatment    (Pt. tol. hd without incident)   Bilateral Breath Sounds Clear   Edema Left upper extremity   RLE Edema Trace   LLE Edema Trace   Time Off 1530   Observations &  Evaluations   Level of Consciousness 0   Oriented X 4   Heart Rhythm Regular   Respiratory Quality/Effort Unlabored   O2 Device None (Room air)      RN completed patient assessment. RN reviewed technicians vital signs and procedure note. Tx completed. Reviewed by RN Feliz Beam Blosser  Pt. Tol. Hd without incident, no s/s of distress. All possible blood returned to pt. Report given to primary nurse Phillis Haggis RN

## 2022-09-28 NOTE — Procedures (Signed)
PROCEDURE NOTE  Date: 09/28/2022   Name: Meredith Hawkins  Date of Birth: 08/31/1985    Procedures  Ultrasound guided peripheral IV placed on L Antecubital and one pair of BC drawn and given to RN.  See LDA.    Barbara Cower RN  VAT

## 2022-09-29 LAB — CULTURE, BLOOD, PCR ID PANEL RESULTS REPORT
Acinetobacter calcoac baumannii complex by PCR: NOT DETECTED
Bacteroides fragilis by PCR: NOT DETECTED
Candida albicans by PCR: NOT DETECTED
Candida auris by PCR: NOT DETECTED
Candida glabrata: NOT DETECTED
Candida krusei by PCR: NOT DETECTED
Candida parapsilosis by PCR: NOT DETECTED
Candida tropicalis by PCR: NOT DETECTED
Cryptococcus neoformans/gattii by PCR: NOT DETECTED
Enterobacter cloacae complex by PCR: NOT DETECTED
Enterobacteriaceae by PCR: NOT DETECTED
Enterococcus faecalis by PCR: NOT DETECTED
Enterococcus faecium by PCR: NOT DETECTED
Escherichia Coli: NOT DETECTED
Haemophilus Influenzae by PCR: NOT DETECTED
Klebsiella aerogenes by PCR: NOT DETECTED
Klebsiella oxytoca by PCR: NOT DETECTED
Klebsiella pneumoniae group by PCR: NOT DETECTED
Listeria monocytogenes by PCR: NOT DETECTED
Methicillin Resistance mecA/C  by PCR: DETECTED — AB
Neisseria meningitidis by PCR: NOT DETECTED
Proteus by PCR: NOT DETECTED
Pseudomonas aeruginosa: NOT DETECTED
STAPHYLOCOCCUS: DETECTED — AB
STREPTOCOCCUS: NOT DETECTED
Salmonella species by PCR: NOT DETECTED
Serratia marcescens by PCR: NOT DETECTED
Staphylococcus Aureus: NOT DETECTED
Staphylococcus epidermidis by PCR: DETECTED — AB
Staphylococcus lugdunensis by PCR: NOT DETECTED
Stenotrophomonas maltophilia by PCR: NOT DETECTED
Strep pneumoniae: NOT DETECTED
Strep pyogenes,(Grp. A): NOT DETECTED
Streptococcus agalactiae (Group B): NOT DETECTED

## 2022-09-29 MED FILL — PROMETHAZINE HCL 25 MG PO TABS: 25 MG | ORAL | Qty: 1

## 2022-09-29 MED FILL — HYDROMORPHONE HCL 1 MG/ML IJ SOLN: 1 MG/ML | INTRAMUSCULAR | Qty: 1

## 2022-09-29 MED FILL — CALCIUM ACETATE (PHOS BINDER) 667 MG PO TABS: 667 MG | ORAL | Qty: 1

## 2022-09-29 MED FILL — METOPROLOL SUCCINATE ER 25 MG PO TB24: 25 MG | ORAL | Qty: 1

## 2022-09-29 MED FILL — FEROSUL 325 (65 FE) MG PO TABS: 325 (65 Fe) MG | ORAL | Qty: 1

## 2022-09-29 MED FILL — HYDRALAZINE HCL 25 MG PO TABS: 25 MG | ORAL | Qty: 1

## 2022-09-29 MED FILL — AMLODIPINE BESYLATE 5 MG PO TABS: 5 MG | ORAL | Qty: 2

## 2022-09-29 MED FILL — PANTOPRAZOLE SODIUM 40 MG PO TBEC: 40 MG | ORAL | Qty: 1

## 2022-09-29 MED FILL — CEFAZOLIN SODIUM 1 G IJ SOLR: 1 g | INTRAMUSCULAR | Qty: 1000

## 2022-09-29 NOTE — Progress Notes (Signed)
ID Progress Note          Admission Summary:   37 y.o. 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 on 6/28 presented to [facility] on 7/5 with left arm swelling and pain. She lives in [place], currently visiting [place]. Pt was transferred to [facility] for further evaluation.     In ER, temp, wbc 6.2, blood cx grew SA, chest CT revealed Diffuse inflammatory changes throughout both breasts, right greater than left; findings may represent cellulitis/mastitis, although underlying neoplasm is not excluded. Duplex study of upper extremity revealed Cellulitis of the left upper arm without evidence for drainable abscess. Pt was evaluated by vascular surgery team. No surgical intervention at this time.      During visit, pt is c/o LUE swelling and pain. Pt can't remember when she had a bowel movement at this time. She still urinates at times, denies dysuria. No fever, chills, nausea, vomiting, sob, doe, chest pain, or abd pain.     Denies smoking, alcohol intake, or substance abuse. Pt has been on HD for the past 8 yrs. AVF RUE; + thrill      ID team was consulted for evaluation and treatment recommendations regarding bacteremia.         Interval history    Consult date 7/8    Attempt to get her nephrology team information to arrange IV abx therapy. Pt replied as, "I am not sure what I will be doing." Pt elected not to share the information.    Antimicrobial therapy history      ancef  Assessment   Bacteremia  LUE cellulitis  - afebrile, wbc 3.6     Blood cx (7/5)MSSA, (7/10) no growth so far     TTE (7/9) normal valvular structure  Hx of SVC syndrome  - evaluated by vascular surgery team; no evidence of innominate, SVC occlusion, no enlarged veins proximal to the stent. No surgical intervention at this time.     ESRD on HD  - AVF RUE; + thrill  Plan   - continue  with IV ancef    Will follow the [facility] cx    Source of infection; appears to be LUE and ? AVF    Attempt to set up potential IV abx therapy with her nephrologist. Pt declined to share the information since she is not sure what she will do after the discharge      ID team will follow  Plan of care d/w pt, hospitaist NP, and Dr. Dagoberto Ligas    Subjective:     No new discomfort    Review of Systems:            Symptom Y/N Comments   Symptom Y/N Comments   Fever/Chills n      Chest Pain n       Poor Appetite       Edema        Cough       Abdominal Pain n       Sputum       Joint Pain        SOB/DOE n      Pruritis/Rash        Nausea/vomit n      Tolerating PT/OT        Diarrhea n      Tolerating Diet  Constipation       Other           Could NOT obtain due to:       Objective:     Vitals: BP (!) 169/93   Pulse 74   Temp 98.1 F (36.7 C) (Oral)   Resp 16   Wt 74.6 kg (164 lb 7.4 oz)   SpO2 96%   BMI 26.55 kg/m      Tmax:  Temp (24hrs), Avg:98.2 F (36.8 C), Min:97.7 F (36.5 C), Max:98.6 F (37 C)      PHYSICAL EXAM:  General: WD, WN. Alert, cooperative, no acute distress    EENT:  EOMI. Anicteric sclerae. MMM  Resp:  RA O2 sat 96%,  No accessory muscle use  CV:  Regular  rhythm,  No edema  Neurologic:  Alert and oriented X 3, normal speech,   Psych:   Fair insight. Not anxious nor agitated  Skin:  No rashes.  No jaundice  LUE; edematous, AVF; + thrill      Data Review:     CBC:  Recent Labs     09/27/22  0708   WBC 3.4*   HGB 7.2*   HCT 22.5*   PLT 187         BMP:  Recent Labs     09/27/22  0708   BUN 51*   NA 136   K 4.6   CL 100   CO2 27         LFTS:  No results for input(s): "ALT", "TP" in the last 72 hours.    Invalid input(s): "TBILI", "SGOT", "AP", "ALB"    Microbiology:   @MICRORESULTS @    Cultures:   Results       Procedure Component Value Units Date/Time    Culture, Blood 1 [[id number]] Collected: 09/28/22 1731    Order Status: Completed Specimen: Blood Updated: 09/28/22 1915     Special Requests NO  SPECIAL REQUESTS        Culture NO GROWTH <24 HRS       Culture, Blood 2 [[id number]]     Order Status: Sent Specimen: Blood     Culture, Blood 2 [[id number]]     Order Status: Canceled Specimen: Blood     Culture, Blood 1 [[id number]]     Order Status: Canceled Specimen: Blood     Culture, Blood 2 [[id number]]     Order Status: Canceled Specimen: Blood     Culture, Blood 1 [[id number]]     Order Status: Canceled Specimen: Blood     Culture, Blood 2 [[id number]]     Order Status: Canceled Specimen: Blood     Culture, Blood 2 [[id number]]     Order Status: Canceled Specimen: Blood     Culture, Blood 1 [[id number]]     Order Status: Canceled Specimen: Blood     Blood Culture 2 [[id number]]  (Abnormal)  (Susceptibility) Collected: 09/23/22 1317    Order Status: Completed Specimen: Blood Updated: 09/26/22 1015     Special Requests --        NO SPECIAL REQUESTS  LEFT  FOREARM       Culture       Staphylococcus aureus GROWING IN BOTH BOTTLES DRAWN SITE = L FA          Susceptibility        Staphylococcus aureus      BACTERIAL SUSCEPTIBILITY PANEL MIC      ciprofloxacin <=0.5 ug/mL Sensitive  clindamycin 0.25 ug/mL Sensitive      DAPTOmycin 0.25 ug/mL Sensitive      doxycycline <=0.5 ug/mL Sensitive      erythromycin >=8 ug/mL Resistant      gentamicin <=0.5 ug/mL Sensitive      levofloxacin 0.25 ug/mL Sensitive      linezolid 4 ug/mL Sensitive      moxifloxacin <=0.25 ug/mL Sensitive      oxacillin 2 ug/mL Sensitive      rifampin <=0.5 ug/mL Sensitive  [1]       tetracycline <=1 ug/mL Sensitive      trimethoprim-sulfamethoxazole >=320 ug/mL Resistant      vancomycin <=0.5 ug/mL Sensitive                   [1]  Rifampin is not to be used for mono-therapy.                   Culture, Blood, PCR ID Panel [[id number]]  (Abnormal) Collected: 09/23/22 1317    Order Status: Completed Specimen: Blood Updated: 09/24/22 0930     Accession Number [id number]     Enterococcus faecalis by PCR Not detected        Enterococcus faecium  by PCR Not detected        Listeria monocytogenes by PCR Not detected        STAPHYLOCOCCUS Detected        Staphylococcus Aureus Detected        Staphylococcus epidermidis by PCR Not detected        Staphylococcus lugdunensis by PCR Not detected        STREPTOCOCCUS Not detected        Streptococcus agalactiae (Group B) Not detected        Strep pneumoniae Not detected        Strep pyogenes,(Grp. A) Not detected        Acinetobacter calcoac baumannii complex by PCR Not detected        Bacteroides fragilis by PCR Not detected        Enterobacteriaceae by PCR Not detected        Enterobacter cloacae complex by PCR Not detected        Escherichia Coli Not detected        Klebsiella aerogenes by PCR Not detected        Klebsiella oxytoca by PCR Not detected        Klebsiella pneumoniae group by PCR Not detected        Proteus by PCR Not detected        Salmonella species by PCR Not detected        Serratia marcescens by PCR Not detected        Haemophilus Influenzae by PCR Not detected        Neisseria meningitidis by PCR Not detected        Pseudomonas aeruginosa Not detected        Stenotrophomonas maltophilia by PCR Not detected        Candida albicans by PCR Not detected        Candida auris by PCR Not detected        Candida glabrata Not detected        Candida krusei by PCR Not detected        Candida parapsilosis by PCR Not detected        Candida tropicalis by PCR Not detected        Cryptococcus neoformans/gattii by PCR Not detected  Resistant gene targets          Resistant gene meca/c & mrej by PCR Not detected        [employer] test comment       False positive results may rarely occur. Correlate with clinical,epidemiologic, and other laboratory findings           Comment: Please see BCID Interpretation Guide in EPIC Links       Blood Culture 1 [[id number]]  (Abnormal) Collected: 09/23/22 1251    Order Status: Completed Specimen: Blood Updated: 09/26/22 1016     Special Requests --        NO SPECIAL  REQUESTS  LEFT  FOREARM       Culture       Staphylococcus aureus GROWING IN 1 OF 2 BOTTLES DRAWN SITE = LFA REFER TO [id number] FOR SENSITIVITIES.                   Labs:   Labs:   Lab Results   Component Value Date/Time    WBC 3.4 09/27/2022 07:08 AM    HGB 7.2 09/27/2022 07:08 AM    HCT 22.5 09/27/2022 07:08 AM    PLT 187 09/27/2022 07:08 AM    MCV 93.4 09/27/2022 07:08 AM     Lab Results   Component Value Date/Time    NA 136 09/27/2022 07:08 AM    K 4.6 09/27/2022 07:08 AM    CL 100 09/27/2022 07:08 AM    CO2 27 09/27/2022 07:08 AM    BUN 51 09/27/2022 07:08 AM    GFRAA 6 05/24/2019 09:30 AM    GLOB 3.8 09/25/2022 01:30 AM    ALT 10 09/25/2022 01:30 AM         Images:     XR CHEST PORTABLE 09/23/2022    Narrative  EXAM:  XR CHEST PORTABLE    INDICATION: Missed dialysis, brachiocephalic and right subclavian stent, known  SVC syndrome    COMPARISON: 05/07/2022    TECHNIQUE: 1319 hours portable chest AP view    FINDINGS: The cardiac silhouette is within normal limits. The stent in the right  subclavian vein and right brachiocephalic vein and SVC is noted. Vascular stents  in the right arm are noted..    The lungs and pleural spaces are clear. The visualized bones and upper abdomen  are age-appropriate.    Impression  No acute process on portable chest.      Electronically signed by Syliva Overman      US EXTREMITY LEFT NON VASC LIMITED    Result Date: 09/25/2022  Ultrasound of the soft tissues. HISTORY: Area of induration just above the left antecubital fossa, where there is an area of drainage. Evaluation for abscess. Targeted ultrasound was performed to the area of interest. There is diffuse skin edema but no drainable abscess. The underlying musculature does not appear involved. No obvious thrombus.     Cellulitis of the left upper arm without evidence for drainable abscess. Electronically signed by Steele Sizer    CT CHEST W CONTRAST    Result Date: 09/25/2022  INDICATION: lymphadenopathy right axilla, right breast  skin changes, swelling. Cellulitis of left upper limb. COMPARISON: None TECHNIQUE: Following intravenous administration of 100 cc Isovue-300, 5 mm axial images were obtained through the chest. Coronal and sagittal reformats were generated.  CT dose reduction was achieved through use of a standardized protocol tailored for this examination and automatic exposure control for dose modulation. FINDINGS: CHEST WALL: Diffuse subcutaneous  edema edema is again seen in both breasts. There is right greater than left diffuse breast skin thickening. Multiple mildly enlarged right axillary lymph nodes are evident. There is no soft tissue swelling or fluid collection in the left upper extremity, which was imaged through the level of the elbow. THYROID: No nodule. MEDIASTINUM: No mass or lymphadenopathy. Vascular stent is present in the innominate/right subclavian vein HILA: No mass or lymphadenopathy. THORACIC AORTA: No dissection or aneurysm. MAIN PULMONARY ARTERY: Normal in caliber. TRACHEA/BRONCHI: Patent. ESOPHAGUS: No wall thickening or dilatation. HEART: Normal in size. Coronary artery calcium: Present, mild PLEURA: No effusion or pneumothorax. LUNGS: No nodule, mass, or airspace disease. INCIDENTALLY IMAGED UPPER ABDOMEN: Atrophic kidneys with small bilateral renal cysts. BONES: No destructive bone lesion.     Diffuse inflammatory changes throughout both breasts, right greater than left; findings may represent cellulitis/mastitis, although underlying neoplasm is not excluded. Clinical follow-up is recommended to assure complete resolution. If findings persist despite treatment, diagnostic mammography would be recommended. No visualized abscess. Enlarged right axillary lymph nodes may be reactive; clinical follow-up is recommended. Electronically signed by Eual Fines    XR CHEST PORTABLE    Result Date: 09/23/2022  EXAM:  XR CHEST PORTABLE INDICATION: Missed dialysis, brachiocephalic and right subclavian stent, known SVC  syndrome COMPARISON: 05/07/2022 TECHNIQUE: 1319 hours portable chest AP view FINDINGS: The cardiac silhouette is within normal limits. The stent in the right subclavian vein and right brachiocephalic vein and SVC is noted. Vascular stents in the right arm are noted.. The lungs and pleural spaces are clear. The visualized bones and upper abdomen are age-appropriate.     No acute process on portable chest. Electronically signed by Tollie Pizza COLE    XR CHEST (2 VW)    Result Date: 09/12/2022  EXAM: XR CHEST 2 VIEWS ACCESSION: [id number] UN CLINICAL INDICATION: CHEST PAIN   TECHNIQUE: PA and Lateral Chest Radiographs. COMPARISON: Chest radiographs 08/05/2022 FINDINGS: Unchanged vascular stents overlying the right subclavian and superior vena cava. Left renal artery embolization. Surgical clips overlying the right hemithorax. Mild to moderate interstitial pulmonary edema. Small bilateral pleural effusions with associated atelectasis. Unchanged cardiomegaly.    Mild to moderate interstitial pulmonary edema with small bilateral pleural effusions and associated atelectasis.    XR CHEST (2 VW)    Result Date: 09/05/2022  CHEST 2 VIEWS: HISTORY:  Shortness of breath and chest pain COMPARISON: 08/10/2022 TECHNIQUE: Frontal and lateral views of the chest FINDINGS: Stable cardiomegaly. Vascular congestion with interstitial prominence and thickening of the pulmonary fissures. Trace effusions bilaterally.    IMPRESSION: Interstitial edema with trace effusions.    CT brain without contrast    Result Date: 09/04/2022  CT of the brain without contrast Clinical: Chest pain, headache TECHNIQUE: Axial images obtained from base of skull to the vertex without contrast FINDINGS: There is some hyperdensity to the vessels in general which may be related to retained contrast from prior contrasted study. Otherwise, no hemorrhage, mass effect or edema. IMPRESSION: No definite acute intracranial abnormality. Diffuse intravascular hyperdensity  presumably related to poor clearing of iodinated contrast from prior contrasted study. Electronically Signed by:  Corey Harold, MD, [facility] Electronically Signed on:  09/04/2022 11:41 PM    CT Abdomen and Pelvis With IV Contrast    Result Date: 09/01/2022  CT ABDOMEN AND PELVIS WITH CONTRAST: HISTORY: R flank pain, history of left sided spontaneous renal bleed COMPARISON: 08/10/2022 INTRAVENOUS CONTRAST:  100 mL of Omnipaque 300. TECHNIQUE: Spiral CT performed from the dome of the  diaphragm to the pubic symphysis following IV contrast.   DICOM format image data available to non-affiliated external healthcare facilities or entities on a secure, media free, reciprocally searchable basis with patient authorization for at least a 12 month period after the study. Dose reduction technique was used on this scan by utilizing automated exposure control, adjustment of the mA and/or kV according to the patient size. FINDINGS: Scout film: Unremarkable Lung bases: Trace right pleural effusion. Cardiomegaly. Hepatobiliary: No focal hepatic lesion or intrahepatic biliary dilation. Pancreas normal morphology and duct caliber. Similar 1.5 cm splenic lesion. Incidental splenic lesions are typically benign and vascular in etiology. Hyperdense material in the gallbladder is nonspecific but could reflect early vicarious excretion. GU: Redemonstrated numerous bilateral renal cysts. Redemonstrated probable hemorrhagic cyst in the lower pole of the left kidney with hemorrhage extending into the perinephric space, similar compared to prior but decreased compared to 07/02/2022 no CT evidence of active contrast extravasation.. Numerous bilateral subcentimeter hypodensities, too small to characterize. No obstructive uropathy. Normal adrenal glands. Excreted contrast in the decompressed bladder. GI: No evidence of bowel obstruction. No mesenteric stranding or wall thickening. No intraperitoneal adenopathy, masses or fluid. Colonic  diverticulosis. Normal appendix. Retroperitoneum: No retroperitoneal adenopathy. Cardiovascular: Normal caliber aorta. Musculoskeletal: No focal lytic or blastic lesion. No fracture or acute derangement. Diffuse body wall edema. Extensive collaterals in the subcutaneous fat of the anterior abdomen and anterior chest and left flank.    IMPRESSION: Redemonstrated numerous bilateral renal cysts. Redemonstrated probable hemorrhagic cyst in the lower pole of the left kidney with hemorrhage extending into the perinephric space, similar compared to prior but decreased compared to 07/02/2022. No CT evidence of active contrast extravasation. No right-sided perinephric hemorrhage.    X-ray chest PA and lateral    Result Date: 08/31/2022  XR CHEST PA AND LATERAL INDICATION: Lung Aeration, R06.00 Dyspnea, unspecified COMPARISON: 08/26/2022 FINDINGS/IMPRESSION: 1.  Redemonstrated vascular stent overlying the right upper thorax. 2.  Unchanged cardiomediastinal silhouette. 3.  Similar background of interstitial opacities, favored edema. Increased basilar predominant opacities likely represent a combination of edema, pleural fluid, and scarring/atelectasis. 4.  Small bilateral pleural effusions. No pneumothorax. Electronically Reviewed by:  Consuelo Pandy, MD, [facility] Electronically Reviewed on:  08/31/2022 7:02 AM I have reviewed the images and concur with the above findings. Electronically Signed by:  Berneda Rose, MD, [facility] Electronically Signed on:  08/31/2022 8:40 AM    CT abdomen pelvis with contrast    Result Date: 08/31/2022  CT abdomen and pelvis with IV contrast Comparison:  08/26/2022. Indication:  RLQ abdominal pain, R10.31 Right lower quadrant pain. Technique:  CT imaging was performed of the abdomen and pelvis following the administration of intravenous contrast.  Iodinated contrast was used due to the indications for the examination, to improve disease detection and further define anatomy. Coronal and sagittal  reformatted images were generated and reviewed. Findings: - Lower Thorax: Small right pleural effusion with associated dependent linear opacities, favored to represent atelectasis/scarring. Innumerable vascular collaterals within the chest wall. - Liver: Normal in morphology and enhancement.  No suspicious hepatic masses are identified.  The portal and hepatic veins are patent. - Biliary and Gallbladder: No intrahepatic or extrahepatic bile duct dilatation. The gallbladder is normal in appearance. - Spleen: Stable small hypodense lesion in the spleen, likely benign. - Pancreas: Normal in appearance. - Adrenal Glands: Normal in appearance. - Kidneys: Redemonstrated innumerable bilateral renal cysts, some of which are immediate density and incompletely evaluated, unchanged from prior.. Slight decrease in  of left renal subcapsular hematoma, measuring 3 x 1.7 cm (series 3 image 45). No hydronephrosis. - Abdominal and Pelvic Vasculature: No abdominal aortic aneurysm. No significant atherosclerotic plaque. Unchanged prominent venous collaterals throughout the body wall with asymmetric contrast opacification of the left greater than right common femoral and iliac veins. - Gastrointestinal Tract: No abnormal dilation or wall thickening. Redemonstrated colonic diverticuli without evidence of acute diverticulitis. Normal appendix. - Peritoneum/Mesentery/Retroperitoneum: No free fluid.  No free intraperitoneal air. - Lymph Nodes: No retroperitoneal, mesenteric, pelvic, or inguinal lymphadenopathy.   - Bladder: Normal in appearance. - Pelvic Organs: Unremarkable. - Body Wall: Redemonstrated diffuse body wall collaterals and anasarca. Unchanged small fat-containing umbilical hernia. - Musculoskeletal:  No aggressive appearing osseous lesions. Sequela of renal osteodystrophy. Impression: 1.  No acute intra-abdominal abnormality. 2.  Decreased left renal subcapsular hematoma. 3. Small right pleural effusion. The preliminary report  (critical or emergent communication) was reviewed prior to this dictation and there are no critical differences between the preliminary results and the impressions in this final report. Electronically Signed by:  Orlean Bradford, MD, [facility] Electronically Signed on:  08/31/2022 8:24 AM      Thank you for the opportunity to participate in the care of this patient.   Please contact with questions or concerns.      The above plan of care that has been discussed and agreed upon by Dr. Dagoberto Ligas.    Pearl Berlinger, APRN - NP    A total time of 35 minutes was spent on today's encounter.  Greater than 50% of the time was spent on the following:  Preparing for visit and chart review.  Obtaining and/or reviewing separately obtained history  Performing a medically appropriate exam and/or evaluation  Counseling and educating a patient/family/caregiver as noted above  Referring and communicating with other professionals (not separately reported)  Independently interpreting results (not separately reported) and communicating results to the patient/family/caregiver  Care coordination (not separately reported) as noted above  Documenting clinical information in the electronic health records (e.g. problem list, visit note) on the day of the encounter

## 2022-09-29 NOTE — Care Coordination-Inpatient (Signed)
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   Caregiver Contact: only at patient request, her godmom Haynes Hoehn is her emergency contact  Discharge Caregiver contacted prior to discharge? Patient will contact  Care Conference needed? no  Barriers to discharge:  medical, awaiting blood culture results, receiving HD in hospital    CM reviewed patient's chart, patient potentially discharging in 24 hours pending blood culture results. Patient will need an ambulatory ride set up to the train station so she can return home to [place] upon DC. CM will follow as needed.    Marylou Mccoy RN, BSN, CM

## 2022-09-29 NOTE — Progress Notes (Signed)
[facility]                                                                                          Hospitalist Progress Note  Malachi Pro, PA-C  Answering service: [phone] OR [phone] from in house phone        Date of Service:  09/29/2022  NAME:  Meredith Hawkins  DOB:  08-22-1985  MRN:  [id number]       Admission Summary:   As per H&P: Meredith Hawkins is a 37 y.o. 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  Patient noted onset of symptoms starting about 1 week prior to arrival. Symptoms notably have remained constant, severe, aching, aggravated with touch, without alleviating factors. Patient is resident of [place], [place] but she is visiting here in [place].       Per chart review: Patient was recent hospitalized at [facility] on 09/16/2022 complaining of right-sided flank, and swelling and pain of right breast, anemia (transfused 2 units of blood with hemoglobin 6.4); was seen by GI with plan for video capsule endoscopy but patient left AMA.  Over the past year, she has had multiple hospitalizations but each discharge record notes Eliquis on discharge medication list.  There have been no other further reports of GI bleed.  Patient does not complain of any hematemesis, hematochezia, or melena or other bleeding over the past week prior to arrival.  She  presented to the ER on 7/5 with the aforementioned complaints.  She reportedly was last dialyzed on 09/20/2022 in the ED at outside hospital in [place].  She notes that she does not go to a dialysis center for hemodialysis due to "personal reasons".  Patient went to [facility] ED 7/5 where workup included labs showing hemoglobin 7.9, neutrophils 84%, sodium 125, BUN 62, creatinine 11.98, GFR 4, calcium 8.3, albumin 3.0, alkaline phosphatase 135.  12-lead EKG shows sinus rhythm, first-degree AV block at 72 beats minute.  ED ordered Dilaudid 1 mg IV x 2 + 0.5 mg IV x 1, Benadryl 25 mg IV x 1, Protonix 40 mg IV x 1, ceftriaxone 1000 mg IV x 1, and vancomycin 1000 mg IV x 1 dose.  ED performed limited bedside ultrasound of soft tissues left upper extremity showing diffuse cobblestoning consistent with cellulitis but no fluid collection consistent with abscess.  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  ED requested transfer to [facility].     Patient reports that she has been banned from all local area dialysis centers in [place] where she lives. She goes to the  [facility] ER 3x/week for her dialysis. She arrived in [place] on Wednesday, 7/3 Has not had dialysis since last Monday, 7/1.  Interval history / Subjective:   Upon seeing the patient on rounds this morning, patient was sitting up in her bed eating breakfast. Patient states she still has pain in "all the same places." Patient states she is less itchy today. Patient states her breasts look the same with regards to swelling yesterday. I asked the patient about her follow up for dialysis once discharged and she told me she has experienced multiple sites in [place] being racist towards her and telling her she has "behavioral problems." The patient states she does not have behavioral problems and does not like being disrespected like that. Patient states there is a dialysis center she can go to  but it is 1.5 hours from her house and she does not have transportation to get there, so that is not an option. Per the patient, her nephrologist told her to come to the [facility] ED for her dialysis which the patient states she does. Patient denies any further complaints at this time.     Assessment & Plan:     Cellulitis of left upper extremity  MSSA Bacteremia   -Ultrasound of LUE obtained which shows cellulitis infection, no drainable abscess   -Area of induration to left upper extremity   -CT scan of chest w/wo contrast obtained which confirms no abscess in the LUE  -Paired blood cultures 7/5 with reported MSSA   -ID following: Continue with Ancef  -Repeat blood cultures on 7/7: probable staphlococcus species  -ECHO negative for vegetation     Right Breast Skin Changes   -Chest CT w/wo contrast (7/7)- consistant with lymphadenopathy of right axilla and inflammatory/infectious skin changes of bilateral breasts which could be mastitis vs. cellulitis, malignancy cannot be ruled out, therefore mammogram outpatient is recommended   -Lymphadenopathy and thickened skin noted      ESRD (end stage renal disease)  -Reportedly does not follow-up with outpatient nephrologist or dialysis center. Patient states she takes a bus and goes to the [facility] ED for her dialysis.  -Nephrology following: continue epogen  -HD ordered here for MWF      Anemia of chronic disease  -Last Hemoglobin 7.2 (7/9) which is slightly lower than patient's baseline but increased compared to hemoglobin 6.4 reported on recent hospitalization. Patient continues to refuse labs.  -Repeat H&H if patient will allow.  -Fecal occult blood test ordered  -Transfuse if hemoglobin less than 7.0     Acute hyponatremia (Resolved)  -Initial serum sodium 125 --> 128. (7/9) 136.  -If able to make any urine, send urine sodium and osmolality.     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  Reduced to 0.5 mg IV every 4 hours as needed for severe pain     Code status: Full   Prophylaxis: SCDs  Care Plan discussed with: Patient, RN, Attending  Anticipated Disposition: TBD     Principal Problem:    Cellulitis of left upper extremity  Resolved Problems:    * No resolved hospital problems. *  Social Determinants of Health     Tobacco Use: Low Risk  (05/07/2022)    Patient History     Smoking Tobacco Use: Never     Smokeless Tobacco Use: Never     Passive Exposure: Not on file   Alcohol Use: Not At Risk (09/23/2022)    AUDIT-C     Frequency of Alcohol Consumption: Never     Average Number of Drinks: Patient does not drink     Frequency of Binge Drinking: Never   Physicist, medical Strain: Not on file   Food Insecurity: Not on file   Transportation Needs: Not on file   Physical Activity: Not on file   Stress: Not on file   Social Connections: Moderately Integrated (09/29/2022)    Social Connections [facility])     If for any reason you need help with day-to-day activities such as bathing, preparing meals, shopping, managing finances, etc., do you get the help you need?: Not on file   Intimate Partner Violence: Not on file   Depression: Not on file   Housing Stability: Not on file   Interpersonal Safety: Not At Risk (09/23/2022)    Interpersonal Safety Domain Source: IP Abuse Screening     Physical abuse: Denies     Verbal abuse: Denies     Emotional abuse: Denies     Financial abuse: Denies     Sexual abuse: Denies   Utilities: Not on file       Review of Systems:   Pertinent items are noted in HPI.       Vital Signs:    Last 24hrs VS reviewed since prior progress note. Most recent are:  Vitals:    09/29/22 1554   BP: (!) 141/76   Pulse: 76   Resp: 18    Temp: 98.4 F (36.9 C)   SpO2: 100%       No intake or output data in the 24 hours ending 09/29/22 1627     Physical Examination:     I had a face to face encounter with this patient and independently examined them on 09/29/2022 as outlined below:    General : alert x 3, awake, no acute distress  HEENT: PEERL, EOMI, moist mucus membrane  Neck: supple, no JVD  Chest: Clear to auscultation bilaterally   Right breast with peau d'orange changes noted to skin, lymphadenopathy to right axilla, right breast larger than left today  CVS: S1 S2 heard, Capillary refill less than 2 seconds  Right fistula with + bruit and + thrill   Abd: soft/ non tender, non distended  Ext: no clubbing, no cyanosis, no edema, left upper extremity tender to touch   Neuro/Psych: pleasant mood and affect, sensory grossly within normal limit  Skin: warm     Data Review:    Review and/or order of clinical lab test  Review and/or order of tests in the radiology section of CPT  Review and/or order of tests in the medicine section of CPT      I have personally and independently reviewed all pertinent labs, diagnostic studies, imaging, and have provided independent interpretation of the same.     Labs:     Recent Labs     09/27/22  0708   WBC 3.4*   HGB 7.2*   HCT 22.5*   PLT 187     Recent Labs     09/27/22  0708   NA 136   K 4.6   CL 100  CO2 27   BUN 51*   PHOS 4.4     Recent Labs     09/27/22  0708   TIBC 183*     Notes reviewed from all clinical/nonclinical/nursing services involved in patient's clinical care. Care coordination discussions were held with appropriate clinical/nonclinical/ nursing providers based on care coordination needs.     Patients current active Medications were reviewed, considered, added and adjusted based on the clinical condition today.      Home Medications were reconciled to the best of my ability given all available resources at the time of admission. Route is PO if not otherwise noted.    Medications Reviewed:      Current Facility-Administered Medications   Medication Dose Route Frequency    HYDROmorphone HCl PF (DILAUDID) injection 0.5 mg  0.5 mg IntraVENous Q6H PRN    epoetin alfa-epbx (RETACRIT) injection 10,000 Units  10,000 Units SubCUTAneous Once per day on Mon Wed Fri    ferrous sulfate (IRON 325) tablet 325 mg  325 mg Oral BID WC    diphenhydrAMINE (BENADRYL) capsule 50 mg  50 mg Oral Q8H PRN    ceFAZolin (ANCEF) 1,000 mg in sterile water 10 mL IV syringe  1,000 mg IntraVENous Q24H    promethazine (PHENERGAN) tablet 25 mg  25 mg Oral Q6H PRN    sodium chloride flush 0.9 % injection 5-40 mL  5-40 mL IntraVENous 2 times per day    sodium chloride flush 0.9 % injection 5-40 mL  5-40 mL IntraVENous PRN    0.9 % sodium chloride infusion   IntraVENous PRN    polyethylene glycol (GLYCOLAX) packet 17 g  17 g Oral Daily PRN    amLODIPine (NORVASC) tablet 10 mg  10 mg Oral Daily    metoprolol succinate (TOPROL XL) extended release tablet 25 mg  25 mg Oral Daily    pantoprazole (PROTONIX) tablet 40 mg  40 mg Oral Daily    hydrALAZINE (APRESOLINE) tablet 25 mg  25 mg Oral TID    calcium acetate (PHOSLO) tablet 667 mg  1 tablet Oral TID WC     ______________________________________________________________________  EXPECTED LENGTH OF STAY: Unable to retrieve estimated LOS  ACTUAL LENGTH OF STAY:          6                 Malachi Pro, PA-C

## 2022-09-30 LAB — BASIC METABOLIC PANEL
Anion Gap: 8 mmol/L (ref 5–15)
BUN/Creatinine Ratio: 5 — ABNORMAL LOW (ref 12–20)
BUN: 48 MG/DL — ABNORMAL HIGH (ref 6–20)
CO2: 30 mmol/L (ref 21–32)
Calcium: 8.8 MG/DL (ref 8.5–10.1)
Chloride: 95 mmol/L — ABNORMAL LOW (ref 97–108)
Creatinine: 9 MG/DL — ABNORMAL HIGH (ref 0.55–1.02)
Est, Glom Filt Rate: 5 mL/min/{1.73_m2} — ABNORMAL LOW (ref >60)
Glucose: 105 mg/dL — ABNORMAL HIGH (ref 65–100)
Potassium: 4.2 mmol/L (ref 3.5–5.1)
Sodium: 133 mmol/L — ABNORMAL LOW (ref 136–145)

## 2022-09-30 LAB — CBC WITH AUTO DIFFERENTIAL
Basophils %: 1 % (ref 0–1)
Basophils Absolute: 0 10*3/uL (ref 0.0–0.1)
Eosinophils %: 11 % — ABNORMAL HIGH (ref 0–7)
Eosinophils Absolute: 0.5 10*3/uL — ABNORMAL HIGH (ref 0.0–0.4)
Hematocrit: 25.3 % — ABNORMAL LOW (ref 35.0–47.0)
Hemoglobin: 8.1 g/dL — ABNORMAL LOW (ref 11.5–16.0)
Immature Granulocytes %: 1 % — ABNORMAL HIGH (ref 0.0–0.5)
Immature Granulocytes Absolute: 0 10*3/uL (ref 0.00–0.04)
Lymphocytes %: 16 % (ref 12–49)
Lymphocytes Absolute: 0.7 10*3/uL — ABNORMAL LOW (ref 0.8–3.5)
MCH: 30 PG (ref 26.0–34.0)
MCHC: 32 g/dL (ref 30.0–36.5)
MCV: 93.7 FL (ref 80.0–99.0)
MPV: 9.4 FL (ref 8.9–12.9)
Monocytes %: 10 % (ref 5–13)
Monocytes Absolute: 0.4 10*3/uL (ref 0.0–1.0)
Neutrophils %: 61 % (ref 32–75)
Neutrophils Absolute: 2.5 10*3/uL (ref 1.8–8.0)
Nucleated RBCs: 0 PER 100 WBC
Platelets: 158 10*3/uL (ref 150–400)
RBC: 2.7 M/uL — ABNORMAL LOW (ref 3.80–5.20)
RDW: 14.6 % — ABNORMAL HIGH (ref 11.5–14.5)
WBC: 4.1 10*3/uL (ref 3.6–11.0)
nRBC: 0 10*3/uL (ref 0.00–0.01)

## 2022-09-30 LAB — CULTURE, BLOOD 1

## 2022-09-30 MED FILL — HYDROMORPHONE HCL 1 MG/ML IJ SOLN: 1 MG/ML | INTRAMUSCULAR | Qty: 1

## 2022-09-30 MED FILL — CALCIUM ACETATE (PHOS BINDER) 667 MG PO TABS: 667 MG | ORAL | Qty: 1

## 2022-09-30 MED FILL — PANTOPRAZOLE SODIUM 40 MG PO TBEC: 40 MG | ORAL | Qty: 1

## 2022-09-30 MED FILL — PROMETHAZINE HCL 25 MG PO TABS: 25 MG | ORAL | Qty: 1

## 2022-09-30 MED FILL — HYDRALAZINE HCL 25 MG PO TABS: 25 MG | ORAL | Qty: 1

## 2022-09-30 MED FILL — RETACRIT 10000 UNIT/ML IJ SOLN: 10000 UNIT/ML | INTRAMUSCULAR | Qty: 1

## 2022-09-30 MED FILL — FEROSUL 325 (65 FE) MG PO TABS: 325 (65 Fe) MG | ORAL | Qty: 1

## 2022-09-30 MED FILL — AMLODIPINE BESYLATE 5 MG PO TABS: 5 MG | ORAL | Qty: 2

## 2022-09-30 MED FILL — CEFAZOLIN SODIUM 1 G IJ SOLR: 1 g | INTRAMUSCULAR | Qty: 1000

## 2022-09-30 MED FILL — METOPROLOL SUCCINATE ER 25 MG PO TB24: 25 MG | ORAL | Qty: 1

## 2022-09-30 NOTE — Progress Notes (Addendum)
[facility]                                                                                          Hospitalist Progress Note  Meredith Brannick, APRN - NP  Answering service: [phone] OR [phone] from in house phone        Date of Service:  09/30/2022  NAME:  Meredith Hawkins  DOB:  [birth year]  MRN:  [id number]       Admission Summary:   As per H&P: Meredith Hawkins is a 37 y.o. 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  Patient noted onset of symptoms starting about 1 week prior to arrival. Symptoms notably have remained constant, severe, aching, aggravated with touch, without alleviating factors. Patient is resident of [place], [place] but she is visiting here in [place].       Per chart review: Patient was recent hospitalized at [facility] on 09/16/2022 complaining of right-sided flank, and swelling and pain of right breast, anemia (transfused 2 units of blood with hemoglobin 6.4); was seen by GI with plan for video capsule endoscopy but patient left AMA.  Over the past year, she has had multiple hospitalizations but each discharge record notes Eliquis on discharge medication list.  There have been no other further reports of GI bleed.  Patient does not complain of any hematemesis, hematochezia, or melena or other bleeding over the past week prior to arrival.   She presented to the ER on 7/5 with the aforementioned complaints.  She reportedly was last dialyzed on 09/20/2022 in the ED at outside hospital in [place].  She notes that she does not go to a dialysis center for hemodialysis due to "personal reasons".  Patient went to [facility] ED 7/5 where workup included labs showing hemoglobin 7.9, neutrophils 84%, sodium 125, BUN 62, creatinine 11.98, GFR 4, calcium 8.3, albumin 3.0, alkaline phosphatase 135.  12-lead EKG shows sinus rhythm, first-degree AV block at 72 beats minute.  ED ordered Dilaudid 1 mg IV x 2 + 0.5 mg IV x 1, Benadryl 25 mg IV x 1, Protonix 40 mg IV x 1, ceftriaxone 1000 mg IV x 1, and vancomycin 1000 mg IV x 1 dose.  ED performed limited bedside ultrasound of soft tissues left upper extremity showing diffuse cobblestoning consistent with cellulitis but no fluid collection consistent with abscess.  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  ED requested transfer to [facility].     Patient reports that she has been banned from all local area dialysis centers in [place] where she lives. She goes  to the [facility] ER 3x/week for her dialysis. She arrived in [place] on Wednesday, 7/3 Has not had dialysis since last Monday, 7/1.  Interval history / Subjective:      Pt refused labs and blood cultures this am  She is now agreeable to have this done  Pt does not have a nephrologist in [facility].  Patient states she goes to the emergency room at [facility] when she needs dialysis.  She is not on a set schedule.  Patient is unwilling to go to [facility] to have dialysis while she receives antibiotics for 4 weeks.  Complicated discharge, patient aware. Discussed with CM. Pt is not willing to transfer to [facility].  HD today    ADDENDUM: 1143 am Dr. Christie Hawkins discussed with pt, now willing to go to [facility] or anywhere in [place].  Transfer center called to initiate transfer.     1318  [facility] declined. Trying [name] and [facility].    Assessment & Plan:     Cellulitis of left upper extremity  MSSA Bacteremia   -Ultrasound of LUE obtained which shows cellulitis infection, no drainable abscess   -Area of induration to left upper extremity   -CT scan of chest w/wo contrast obtained which confirms no abscess in the LUE  -Paired blood cultures 7/5 with reported MSSA   -ID following: Continue with Ancef  -Repeat blood cultures on 7/7: probable staphlococcus species  -ECHO negative for vegetation  -see ID note from 7/12 for IV abx plan     Right Breast Skin Changes   -Chest CT w/wo contrast (7/7)- consistant with lymphadenopathy of right axilla and inflammatory/infectious skin changes of bilateral breasts which could be mastitis vs. cellulitis, malignancy cannot be ruled out, therefore mammogram outpatient is recommended   -Lymphadenopathy and thickened skin noted      ESRD (end stage renal disease)  -Reportedly does not follow-up with outpatient nephrologist or dialysis center. Patient states she takes a bus and goes to the [facility] ED for her dialysis.  -Nephrology following: continue epogen  -HD ordered here for MWF      Anemia of chronic disease  -Last Hemoglobin 7.2 (7/9) which is slightly lower than patient's baseline but increased compared to hemoglobin 6.4 reported on recent hospitalization. Patient continues to refuse labs.  -Repeat H&H if patient will allow.  -Fecal occult blood test ordered  -Transfuse if hemoglobin less than 7.0     Acute hyponatremia (Resolved)  -Initial serum sodium 125 --> 128. (7/9) 136.  -If able to make any urine, send urine sodium and osmolality.     SVC syndrome, ruled out per Vascular Surgery   -History of SVC thrombosis/stent  -Eliquis on hold with patient reporting "dark stools" and vomit with "red blood" today   -Consult vascular surgeon: no surgical interventions needed at this time     Edema of both upper extremities  Edema of neck  -Plan as above      Generalized abdominal pain  -Received Dilaudid without allergic reaction.  Reduced to 0.5 mg IV every 4 hours as needed for severe pain     Code status: Full   Prophylaxis: SCDs  Care Plan discussed with: Patient, RN, Attending  Anticipated Disposition: TBD     Principal Problem:    Cellulitis of left upper extremity  Resolved Problems:    * No resolved hospital problems. *    Social Determinants of Health     Tobacco Use: Low Risk  (05/07/2022)  Patient History     Smoking Tobacco Use: Never     Smokeless Tobacco Use: Never     Passive Exposure: Not on file   Alcohol Use: Not At Risk (09/23/2022)    AUDIT-C     Frequency of Alcohol Consumption: Never     Average Number of Drinks: Patient does not drink     Frequency of Binge Drinking: Never   Financial Resource Strain: Not on file   Food Insecurity: Not on file   Transportation Needs: Not on file   Physical Activity: Not on file   Stress: Not on file   Social Connections: Moderately Integrated (09/30/2022)    Social Connections [facility])     If for any reason you need help with day-to-day activities such as bathing, preparing meals, shopping, managing finances, etc., do you get the help you need?: Not on file   Intimate Partner Violence: Not on file   Depression: Not on file   Housing Stability: Not on file   Interpersonal Safety: Not At Risk (09/23/2022)    Interpersonal Safety Domain Source: IP Abuse Screening     Physical abuse: Denies     Verbal abuse: Denies     Emotional abuse: Denies     Financial abuse: Denies     Sexual abuse: Denies   Utilities: Not on file       Review of Systems:   Pertinent items are noted in HPI.       Vital Signs:    Last 24hrs VS reviewed since prior progress note. Most recent are:  Vitals:    09/30/22 0759   BP: (!) 163/99   Pulse: 77   Resp: 18   Temp: 97.3 F (36.3 C)   SpO2: 100%       No intake or output data in the 24 hours ending 09/30/22 0820     Physical Examination:     I had a face to face encounter with this patient and  independently examined them on 09/30/2022 as outlined below:    General : alert x 3, awake, no acute distress  HEENT: EOMI, moist mucus membrane  Neck: supple, no JVD  Chest: Clear to auscultation bilaterally   Right breast: with peau d'orange changes noted to skin, lymphadenopathy to right axilla, right breast larger than left   CVS: RRR, S1 S2 heard  Right fistula with + bruit and + thrill   Abd: soft/ non tender, non distended  Ext: no clubbing, no cyanosis, no edema, left upper extremity tender to touch   Neuro/Psych: irritable mood and affect, MAE spontaneously  Skin: warm     Data Review:    Review and/or order of clinical lab test  Review and/or order of tests in the radiology section of CPT  Review and/or order of tests in the medicine section of CPT      I have personally and independently reviewed all pertinent labs, diagnostic studies, imaging, and have provided independent interpretation of the same.     Labs:     No results for input(s): "WBC", "HGB", "HCT", "PLT" in the last 72 hours.    No results for input(s): "NA", "K", "CL", "CO2", "BUN", "GLU", "MG", "PHOS" in the last 72 hours.    Invalid input(s): "CREA", "CA", "URICA"    No results for input(s): "TIBC" in the last 72 hours.    Invalid input(s): "FE", "PSAT", "FERR"    Notes reviewed from all clinical/nonclinical/nursing services involved in patient's clinical care. Care coordination  discussions were held with appropriate clinical/nonclinical/ nursing providers based on care coordination needs.     Patients current active Medications were reviewed, considered, added and adjusted based on the clinical condition today.      Home Medications were reconciled to the best of my ability given all available resources at the time of admission. Route is PO if not otherwise noted.    Medications Reviewed:     Current Facility-Administered Medications   Medication Dose Route Frequency    HYDROmorphone HCl PF (DILAUDID) injection 0.5 mg  0.5 mg IntraVENous  Q6H PRN    epoetin alfa-epbx (RETACRIT) injection 10,000 Units  10,000 Units SubCUTAneous Once per day on Mon Wed Fri    ferrous sulfate (IRON 325) tablet 325 mg  325 mg Oral BID WC    diphenhydrAMINE (BENADRYL) capsule 50 mg  50 mg Oral Q8H PRN    ceFAZolin (ANCEF) 1,000 mg in sterile water 10 mL IV syringe  1,000 mg IntraVENous Q24H    promethazine (PHENERGAN) tablet 25 mg  25 mg Oral Q6H PRN    sodium chloride flush 0.9 % injection 5-40 mL  5-40 mL IntraVENous 2 times per day    sodium chloride flush 0.9 % injection 5-40 mL  5-40 mL IntraVENous PRN    0.9 % sodium chloride infusion   IntraVENous PRN    polyethylene glycol (GLYCOLAX) packet 17 g  17 g Oral Daily PRN    amLODIPine (NORVASC) tablet 10 mg  10 mg Oral Daily    metoprolol succinate (TOPROL XL) extended release tablet 25 mg  25 mg Oral Daily    pantoprazole (PROTONIX) tablet 40 mg  40 mg Oral Daily    hydrALAZINE (APRESOLINE) tablet 25 mg  25 mg Oral TID    calcium acetate (PHOSLO) tablet 667 mg  1 tablet Oral TID WC     ______________________________________________________________________  EXPECTED LENGTH OF STAY: Unable to retrieve estimated LOS  ACTUAL LENGTH OF STAY:          7                 Meredith Borgeson, APRN - NP

## 2022-09-30 NOTE — Progress Notes (Signed)
Patient is refusing to have her blood cultures done.

## 2022-09-30 NOTE — Progress Notes (Signed)
Patient refused Labs.

## 2022-09-30 NOTE — Progress Notes (Signed)
ID Progress Note          Admission Summary:   37 y.o. 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 She lives in [place], currently visiting [place]. Pt was transferred to [facility] for further evaluation.     In ER, temp, wbc 6.2, blood cx grew SA, chest CT revealed Diffuse inflammatory changes throughout both breasts, right greater than left; findings may represent cellulitis/mastitis, although underlying neoplasm is not excluded. Duplex study of upper extremity revealed Cellulitis of the left upper arm without evidence for drainable abscess. Pt was evaluated by vascular surgery team. No surgical intervention at this time.      During visit, pt is c/o LUE swelling and pain. Pt can't remember when she had a bowel movement at this time. She still urinates at times, denies dysuria. No fever, chills, nausea, vomiting, sob, doe, chest pain, or abd pain.     Denies smoking, alcohol intake, or substance abuse. Pt has been on HD for the past 8 yrs. AVF RUE; + thrill      ID team was consulted for evaluation and treatment recommendations regarding bacteremia.         Interval history    Consult date 7/8    7/11: Attempt to get her nephrology team information to arrange IV abx therapy. Pt replied as, "I am not sure what I will be doing." Pt elected not to share the information.    7/12: Recommend to get repeat BC, but pt declined at this time. Attempt to obtain where she gets dialyzed in [place], pt declined to share the information.  Antimicrobial therapy history      ancef  Assessment   Bacteremia  LUE cellulitis  - afebrile, wbc 3.6     Blood cx (7/5)MSSA, (7/10) CoNS     TTE (7/9) normal valvular structure    Hx of SVC syndrome  - evaluated by vascular surgery team; no evidence of innominate, SVC  occlusion, no enlarged veins proximal to the stent. No surgical intervention at this time.     ESRD on HD  - AVF RUE; + thrill  Plan   - continue with IV ancef    Source of infection; appears to be LUE and ? AVF    LUE cellulitis resolved, but concerned with possible AVF infection.    Pt declined for repeat BC      Once again, I attempt to set up potential IV abx therapy with her nephrologist. Pt declined to share the information.       At this time, we recommend pt to complete 4 weeks of IV ancef 2/2/3gm after HD, end date 10/25/2022. Weekly lab will be followed by nephrologist. If there's any concerns please let us know.      Final IV abx order in place    Plan of care d/w pt, hospitaist NP, and Dr. Dagoberto Ligas  ID team signing off. Please contact us with any questions.     Subjective:     No new discomfort    Review of Systems:            Symptom Y/N Comments   Symptom Y/N Comments   Fever/Chills n      Chest Pain n       Poor Appetite  Edema        Cough       Abdominal Pain n       Sputum       Joint Pain        SOB/DOE n      Pruritis/Rash        Nausea/vomit n      Tolerating PT/OT        Diarrhea n      Tolerating Diet        Constipation       Other           Could NOT obtain due to:       Objective:     Vitals: BP 132/82   Pulse 92   Temp 98.2 F (36.8 C)   Resp 16   Wt 74.6 kg (164 lb 7.4 oz)   SpO2 99%   BMI 26.55 kg/m      Tmax:  Temp (24hrs), Avg:98.2 F (36.8 C), Min:98.1 F (36.7 C), Max:98.4 F (36.9 C)      PHYSICAL EXAM:  General: WD, WN. Alert, cooperative, no acute distress    EENT:  EOMI. Anicteric sclerae. MMM  Resp:  RA O2 sat 96%,  No accessory muscle use  CV:  Regular  rhythm,  No edema  Neurologic:  Alert and oriented X 3, normal speech,   Psych:   Fair insight. Not anxious nor agitated  Skin:  No rashes.  No jaundice  LUE; no edema or erythema RUE: AVF; + thrill      Data Review:     CBC:  No results for input(s): "WBC", "LYMPHS", "MONOS", "HGB", "HCT", "PLT" in the last 72  hours.    Invalid input(s): "GRANS", "EOS", "ANEU", "ABL", "ABE"      BMP:  No results for input(s): "BUN", "NA", "K", "CL", "CO2", "GLU" in the last 72 hours.    Invalid input(s): "CREA", "AGAP"      LFTS:  No results for input(s): "ALT", "TP" in the last 72 hours.    Invalid input(s): "TBILI", "SGOT", "AP", "ALB"    Microbiology:   @MICRORESULTS @    Cultures:   Results       Procedure Component Value Units Date/Time    Culture, Blood 1 [[id number]]  (Abnormal) Collected: 09/28/22 1731    Order Status: Completed Specimen: Blood Updated: 09/29/22 2221     Special Requests NO SPECIAL REQUESTS        Culture       PROBABLE STAPHYLOCOCCUS SPECIES, COAGULASE NEGATIVE GROWING IN BOTH BOTTLES DRAWN SITE= LFA            (NOTE) GP[facility] CALLED TO AND READ BACK BY SHELBY PUTT, RN AT 1455 ON 09/29/22. LR    Culture, Blood, PCR ID Panel [[id number]]  (Abnormal) Collected: 09/28/22 1731    Order Status: Completed Specimen: Blood Updated: 09/29/22 1612     Accession Number [id number]     Enterococcus faecalis by PCR Not detected        Enterococcus faecium by PCR Not detected        Listeria monocytogenes by PCR Not detected        STAPHYLOCOCCUS Detected        Staphylococcus Aureus Not detected        Staphylococcus epidermidis by PCR Detected        Staphylococcus lugdunensis by PCR Not detected        STREPTOCOCCUS Not detected        Streptococcus agalactiae (  Group B) Not detected        Strep pneumoniae Not detected        Strep pyogenes,(Grp. A) Not detected        Acinetobacter calcoac baumannii complex by PCR Not detected        Bacteroides fragilis by PCR Not detected        Enterobacteriaceae by PCR Not detected        Enterobacter cloacae complex by PCR Not detected        Escherichia Coli Not detected        Klebsiella aerogenes by PCR Not detected        Klebsiella oxytoca by PCR Not detected        Klebsiella pneumoniae group by PCR Not detected        Proteus by PCR Not detected        Salmonella species by PCR Not  detected        Serratia marcescens by PCR Not detected        Haemophilus Influenzae by PCR Not detected        Neisseria meningitidis by PCR Not detected        Pseudomonas aeruginosa Not detected        Stenotrophomonas maltophilia by PCR Not detected        Candida albicans by PCR Not detected        Candida auris by PCR Not detected        Candida glabrata Not detected        Candida krusei by PCR Not detected        Candida parapsilosis by PCR Not detected        Candida tropicalis by PCR Not detected        Cryptococcus neoformans/gattii by PCR Not detected        Resistant gene targets          Methicillin Resistance mecA/C  by PCR Detected        Biofire test comment       False positive results may rarely occur. Correlate with clinical,epidemiologic, and other laboratory findings           Comment: Please see BCID Interpretation Guide in EPIC Links       Culture, Blood 2 [[id number]]     Order Status: Sent Specimen: Blood     Culture, Blood 2 [[id number]]     Order Status: Canceled Specimen: Blood     Culture, Blood 1 [[id number]]     Order Status: Canceled Specimen: Blood     Culture, Blood 2 [[id number]]     Order Status: Canceled Specimen: Blood     Culture, Blood 1 [[id number]]     Order Status: Canceled Specimen: Blood     Culture, Blood 2 [[id number]]     Order Status: Canceled Specimen: Blood     Culture, Blood 2 [[id number]]     Order Status: Canceled Specimen: Blood     Culture, Blood 1 [[id number]]     Order Status: Canceled Specimen: Blood     Blood Culture 2 [[id number]]  (Abnormal)  (Susceptibility) Collected: 09/23/22 1317    Order Status: Completed Specimen: Blood Updated: 09/26/22 1015     Special Requests --        NO SPECIAL REQUESTS  LEFT  FOREARM       Culture       Staphylococcus aureus GROWING IN BOTH BOTTLES DRAWN SITE = L FA  Susceptibility        Staphylococcus aureus      BACTERIAL SUSCEPTIBILITY PANEL MIC      ciprofloxacin <=0.5 ug/mL Sensitive      clindamycin 0.25  ug/mL Sensitive      DAPTOmycin 0.25 ug/mL Sensitive      doxycycline <=0.5 ug/mL Sensitive      erythromycin >=8 ug/mL Resistant      gentamicin <=0.5 ug/mL Sensitive      levofloxacin 0.25 ug/mL Sensitive      linezolid 4 ug/mL Sensitive      moxifloxacin <=0.25 ug/mL Sensitive      oxacillin 2 ug/mL Sensitive      rifampin <=0.5 ug/mL Sensitive  [1]       tetracycline <=1 ug/mL Sensitive      trimethoprim-sulfamethoxazole >=320 ug/mL Resistant      vancomycin <=0.5 ug/mL Sensitive                   [1]  Rifampin is not to be used for mono-therapy.                   Culture, Blood, PCR ID Panel [[id number]]  (Abnormal) Collected: 09/23/22 1317    Order Status: Completed Specimen: Blood Updated: 09/24/22 0930     Accession Number [id number]     Enterococcus faecalis by PCR Not detected        Enterococcus faecium by PCR Not detected        Listeria monocytogenes by PCR Not detected        STAPHYLOCOCCUS Detected        Staphylococcus Aureus Detected        Staphylococcus epidermidis by PCR Not detected        Staphylococcus lugdunensis by PCR Not detected        STREPTOCOCCUS Not detected        Streptococcus agalactiae (Group B) Not detected        Strep pneumoniae Not detected        Strep pyogenes,(Grp. A) Not detected        Acinetobacter calcoac baumannii complex by PCR Not detected        Bacteroides fragilis by PCR Not detected        Enterobacteriaceae by PCR Not detected        Enterobacter cloacae complex by PCR Not detected        Escherichia Coli Not detected        Klebsiella aerogenes by PCR Not detected        Klebsiella oxytoca by PCR Not detected        Klebsiella pneumoniae group by PCR Not detected        Proteus by PCR Not detected        Salmonella species by PCR Not detected        Serratia marcescens by PCR Not detected        Haemophilus Influenzae by PCR Not detected        Neisseria meningitidis by PCR Not detected        Pseudomonas aeruginosa Not detected        Stenotrophomonas  maltophilia by PCR Not detected        Candida albicans by PCR Not detected        Candida auris by PCR Not detected        Candida glabrata Not detected        Candida krusei by PCR Not detected  Candida parapsilosis by PCR Not detected        Candida tropicalis by PCR Not detected        Cryptococcus neoformans/gattii by PCR Not detected        Resistant gene targets          Resistant gene meca/c & mrej by PCR Not detected        Biofire test comment       False positive results may rarely occur. Correlate with clinical,epidemiologic, and other laboratory findings           Comment: Please see BCID Interpretation Guide in EPIC Links       Blood Culture 1 [[id number]]  (Abnormal) Collected: 09/23/22 1251    Order Status: Completed Specimen: Blood Updated: 09/26/22 1016     Special Requests --        NO SPECIAL REQUESTS  LEFT  FOREARM       Culture       Staphylococcus aureus GROWING IN 1 OF 2 BOTTLES DRAWN SITE = LFA REFER TO [id number] FOR SENSITIVITIES.                   Labs:   Labs:   Lab Results   Component Value Date/Time    WBC 3.4 09/27/2022 07:08 AM    HGB 7.2 09/27/2022 07:08 AM    HCT 22.5 09/27/2022 07:08 AM    PLT 187 09/27/2022 07:08 AM    MCV 93.4 09/27/2022 07:08 AM     Lab Results   Component Value Date/Time    NA 136 09/27/2022 07:08 AM    K 4.6 09/27/2022 07:08 AM    CL 100 09/27/2022 07:08 AM    CO2 27 09/27/2022 07:08 AM    BUN 51 09/27/2022 07:08 AM    GFRAA 6 05/24/2019 09:30 AM    GLOB 3.8 09/25/2022 01:30 AM    ALT 10 09/25/2022 01:30 AM         Images:     XR CHEST PORTABLE 09/23/2022    Narrative  EXAM:  XR CHEST PORTABLE    INDICATION: Missed dialysis, brachiocephalic and right subclavian stent, known  SVC syndrome    COMPARISON: 05/07/2022    TECHNIQUE: 1319 hours portable chest AP view    FINDINGS: The cardiac silhouette is within normal limits. The stent in the right  subclavian vein and right brachiocephalic vein and SVC is noted. Vascular stents  in the right arm are  noted..    The lungs and pleural spaces are clear. The visualized bones and upper abdomen  are age-appropriate.    Impression  No acute process on portable chest.      Electronically signed by Syliva Overman      US EXTREMITY LEFT NON VASC LIMITED    Result Date: 09/25/2022  Ultrasound of the soft tissues. HISTORY: Area of induration just above the left antecubital fossa, where there is an area of drainage. Evaluation for abscess. Targeted ultrasound was performed to the area of interest. There is diffuse skin edema but no drainable abscess. The underlying musculature does not appear involved. No obvious thrombus.     Cellulitis of the left upper arm without evidence for drainable abscess. Electronically signed by Steele Sizer    CT CHEST W CONTRAST    Result Date: 09/25/2022  INDICATION: lymphadenopathy right axilla, right breast skin changes, swelling. Cellulitis of left upper limb. COMPARISON: None TECHNIQUE: Following intravenous administration of 100 cc Isovue-300, 5 mm axial images were  obtained through the chest. Coronal and sagittal reformats were generated.  CT dose reduction was achieved through use of a standardized protocol tailored for this examination and automatic exposure control for dose modulation. FINDINGS: CHEST WALL: Diffuse subcutaneous edema edema is again seen in both breasts. There is right greater than left diffuse breast skin thickening. Multiple mildly enlarged right axillary lymph nodes are evident. There is no soft tissue swelling or fluid collection in the left upper extremity, which was imaged through the level of the elbow. THYROID: No nodule. MEDIASTINUM: No mass or lymphadenopathy. Vascular stent is present in the innominate/right subclavian vein HILA: No mass or lymphadenopathy. THORACIC AORTA: No dissection or aneurysm. MAIN PULMONARY ARTERY: Normal in caliber. TRACHEA/BRONCHI: Patent. ESOPHAGUS: No wall thickening or dilatation. HEART: Normal in size. Coronary artery calcium:  Present, mild PLEURA: No effusion or pneumothorax. LUNGS: No nodule, mass, or airspace disease. INCIDENTALLY IMAGED UPPER ABDOMEN: Atrophic kidneys with small bilateral renal cysts. BONES: No destructive bone lesion.     Diffuse inflammatory changes throughout both breasts, right greater than left; findings may represent cellulitis/mastitis, although underlying neoplasm is not excluded. Clinical follow-up is recommended to assure complete resolution. If findings persist despite treatment, diagnostic mammography would be recommended. No visualized abscess. Enlarged right axillary lymph nodes may be reactive; clinical follow-up is recommended. Electronically signed by Eual Fines    XR CHEST PORTABLE    Result Date: 09/23/2022  EXAM:  XR CHEST PORTABLE INDICATION: Missed dialysis, brachiocephalic and right subclavian stent, known SVC syndrome COMPARISON: 05/07/2022 TECHNIQUE: 1319 hours portable chest AP view FINDINGS: The cardiac silhouette is within normal limits. The stent in the right subclavian vein and right brachiocephalic vein and SVC is noted. Vascular stents in the right arm are noted.. The lungs and pleural spaces are clear. The visualized bones and upper abdomen are age-appropriate.     No acute process on portable chest. Electronically signed by Tollie Pizza COLE    XR CHEST (2 VW)    Result Date: 09/12/2022  EXAM: XR CHEST 2 VIEWS ACCESSION: [id number] UN CLINICAL INDICATION: CHEST PAIN   TECHNIQUE: PA and Lateral Chest Radiographs. COMPARISON: Chest radiographs 08/05/2022 FINDINGS: Unchanged vascular stents overlying the right subclavian and superior vena cava. Left renal artery embolization. Surgical clips overlying the right hemithorax. Mild to moderate interstitial pulmonary edema. Small bilateral pleural effusions with associated atelectasis. Unchanged cardiomegaly.    Mild to moderate interstitial pulmonary edema with small bilateral pleural effusions and associated atelectasis.    XR CHEST (2  VW)    Result Date: 09/05/2022  CHEST 2 VIEWS: HISTORY:  Shortness of breath and chest pain COMPARISON: 08/10/2022 TECHNIQUE: Frontal and lateral views of the chest FINDINGS: Stable cardiomegaly. Vascular congestion with interstitial prominence and thickening of the pulmonary fissures. Trace effusions bilaterally.    IMPRESSION: Interstitial edema with trace effusions.    CT brain without contrast    Result Date: 09/04/2022  CT of the brain without contrast Clinical: Chest pain, headache TECHNIQUE: Axial images obtained from base of skull to the vertex without contrast FINDINGS: There is some hyperdensity to the vessels in general which may be related to retained contrast from prior contrasted study. Otherwise, no hemorrhage, mass effect or edema. IMPRESSION: No definite acute intracranial abnormality. Diffuse intravascular hyperdensity presumably related to poor clearing of iodinated contrast from prior contrasted study. Electronically Signed by:  Corey Harold, MD, [facility] Electronically Signed on:  09/04/2022 11:41 PM    CT Abdomen and Pelvis With IV Contrast    Result Date:  09/01/2022  CT ABDOMEN AND PELVIS WITH CONTRAST: HISTORY: R flank pain, history of left sided spontaneous renal bleed COMPARISON: 08/10/2022 INTRAVENOUS CONTRAST:  100 mL of Omnipaque 300. TECHNIQUE: Spiral CT performed from the dome of the diaphragm to the pubic symphysis following IV contrast.   DICOM format image data available to non-affiliated external healthcare facilities or entities on a secure, media free, reciprocally searchable basis with patient authorization for at least a 12 month period after the study. Dose reduction technique was used on this scan by utilizing automated exposure control, adjustment of the mA and/or kV according to the patient size. FINDINGS: Scout film: Unremarkable Lung bases: Trace right pleural effusion. Cardiomegaly. Hepatobiliary: No focal hepatic lesion or intrahepatic biliary dilation. Pancreas  normal morphology and duct caliber. Similar 1.5 cm splenic lesion. Incidental splenic lesions are typically benign and vascular in etiology. Hyperdense material in the gallbladder is nonspecific but could reflect early vicarious excretion. GU: Redemonstrated numerous bilateral renal cysts. Redemonstrated probable hemorrhagic cyst in the lower pole of the left kidney with hemorrhage extending into the perinephric space, similar compared to prior but decreased compared to 07/02/2022 no CT evidence of active contrast extravasation.. Numerous bilateral subcentimeter hypodensities, too small to characterize. No obstructive uropathy. Normal adrenal glands. Excreted contrast in the decompressed bladder. GI: No evidence of bowel obstruction. No mesenteric stranding or wall thickening. No intraperitoneal adenopathy, masses or fluid. Colonic diverticulosis. Normal appendix. Retroperitoneum: No retroperitoneal adenopathy. Cardiovascular: Normal caliber aorta. Musculoskeletal: No focal lytic or blastic lesion. No fracture or acute derangement. Diffuse body wall edema. Extensive collaterals in the subcutaneous fat of the anterior abdomen and anterior chest and left flank.    IMPRESSION: Redemonstrated numerous bilateral renal cysts. Redemonstrated probable hemorrhagic cyst in the lower pole of the left kidney with hemorrhage extending into the perinephric space, similar compared to prior but decreased compared to 07/02/2022. No CT evidence of active contrast extravasation. No right-sided perinephric hemorrhage.    X-ray chest PA and lateral    Result Date: 08/31/2022  XR CHEST PA AND LATERAL INDICATION: Lung Aeration, R06.00 Dyspnea, unspecified COMPARISON: 08/26/2022 FINDINGS/IMPRESSION: 1.  Redemonstrated vascular stent overlying the right upper thorax. 2.  Unchanged cardiomediastinal silhouette. 3.  Similar background of interstitial opacities, favored edema. Increased basilar predominant opacities likely represent a combination  of edema, pleural fluid, and scarring/atelectasis. 4.  Small bilateral pleural effusions. No pneumothorax. Electronically Reviewed by:  Consuelo Pandy, MD, [facility] Electronically Reviewed on:  08/31/2022 7:02 AM I have reviewed the images and concur with the above findings. Electronically Signed by:  Berneda Rose, MD, [facility] Electronically Signed on:  08/31/2022 8:40 AM    CT abdomen pelvis with contrast    Result Date: 08/31/2022  CT abdomen and pelvis with IV contrast Comparison:  08/26/2022. Indication:  RLQ abdominal pain, R10.31 Right lower quadrant pain. Technique:  CT imaging was performed of the abdomen and pelvis following the administration of intravenous contrast.  Iodinated contrast was used due to the indications for the examination, to improve disease detection and further define anatomy. Coronal and sagittal reformatted images were generated and reviewed. Findings: - Lower Thorax: Small right pleural effusion with associated dependent linear opacities, favored to represent atelectasis/scarring. Innumerable vascular collaterals within the chest wall. - Liver: Normal in morphology and enhancement.  No suspicious hepatic masses are identified.  The portal and hepatic veins are patent. - Biliary and Gallbladder: No intrahepatic or extrahepatic bile duct dilatation. The gallbladder is normal in appearance. - Spleen: Stable small hypodense lesion  in the spleen, likely benign. - Pancreas: Normal in appearance. - Adrenal Glands: Normal in appearance. - Kidneys: Redemonstrated innumerable bilateral renal cysts, some of which are immediate density and incompletely evaluated, unchanged from prior.. Slight decrease in of left renal subcapsular hematoma, measuring 3 x 1.7 cm (series 3 image 45). No hydronephrosis. - Abdominal and Pelvic Vasculature: No abdominal aortic aneurysm. No significant atherosclerotic plaque. Unchanged prominent venous collaterals throughout the body wall with asymmetric contrast  opacification of the left greater than right common femoral and iliac veins. - Gastrointestinal Tract: No abnormal dilation or wall thickening. Redemonstrated colonic diverticuli without evidence of acute diverticulitis. Normal appendix. - Peritoneum/Mesentery/Retroperitoneum: No free fluid.  No free intraperitoneal air. - Lymph Nodes: No retroperitoneal, mesenteric, pelvic, or inguinal lymphadenopathy.   - Bladder: Normal in appearance. - Pelvic Organs: Unremarkable. - Body Wall: Redemonstrated diffuse body wall collaterals and anasarca. Unchanged small fat-containing umbilical hernia. - Musculoskeletal:  No aggressive appearing osseous lesions. Sequela of renal osteodystrophy. Impression: 1.  No acute intra-abdominal abnormality. 2.  Decreased left renal subcapsular hematoma. 3. Small right pleural effusion. The preliminary report (critical or emergent communication) was reviewed prior to this dictation and there are no critical differences between the preliminary results and the impressions in this final report. Electronically Signed by:  Orlean Bradford, MD, [facility] Electronically Signed on:  08/31/2022 8:24 AM      Thank you for the opportunity to participate in the care of this patient.   Please contact with questions or concerns.      The above plan of care that has been discussed and agreed upon by Dr. Dagoberto Ligas.    Kipling Graser, APRN - NP    A total time of 35 minutes was spent on today's encounter.  Greater than 50% of the time was spent on the following:  Preparing for visit and chart review.  Obtaining and/or reviewing separately obtained history  Performing a medically appropriate exam and/or evaluation  Counseling and educating a patient/family/caregiver as noted above  Referring and communicating with other professionals (not separately reported)  Independently interpreting results (not separately reported) and communicating results to the patient/family/caregiver  Care coordination (not separately  reported) as noted above  Documenting clinical information in the electronic health records (e.g. problem list, visit note) on the day of the encounter

## 2022-09-30 NOTE — Progress Notes (Signed)
[facility]     Renal Daily Progress Note:     Subjective: Completed dialysis early this AM. Tolerated treatment. No complaints. Surgery advises post dialysis ATB, no surgical intervention right now. She would like to be M-WF  Not SOB, no chest pain, left arm still tender, no N/V or abd pain    7/8-feels ok, no complaints, HD later today  7/9-seen on hemodialysis.  Blood pressure stable.  Dialysis was changed to this morning due to scheduling issues.  7/10-no new complaints.  No labs done this morning looks like patient refused  09/30/22 Resting in bed, refused labs earlier    Review of Systems  Pertinent items are noted in HPI.    Objective:     BP (!) 163/99   Pulse 83   Temp 97.3 F (36.3 C)   Resp 18   Wt 74.6 kg (164 lb 7.4 oz)   SpO2 100%   BMI 26.55 kg/m   Temp (24hrs), Avg:98 F (36.7 C), Min:97.3 F (36.3 C), Max:98.4 F (36.9 C)      No intake or output data in the 24 hours ending 09/30/22 1110    Current Facility-Administered Medications   Medication Dose Route Frequency    HYDROmorphone HCl PF (DILAUDID) injection 0.5 mg  0.5 mg IntraVENous Q6H PRN    epoetin alfa-epbx (RETACRIT) injection 10,000 Units  10,000 Units SubCUTAneous Once per day on Mon Wed Fri    ferrous sulfate (IRON 325) tablet 325 mg  325 mg Oral BID WC    diphenhydrAMINE (BENADRYL) capsule 50 mg  50 mg Oral Q8H PRN    ceFAZolin (ANCEF) 1,000 mg in sterile water 10 mL IV syringe  1,000 mg IntraVENous Q24H    promethazine (PHENERGAN) tablet 25 mg  25 mg Oral Q6H PRN    sodium chloride flush 0.9 % injection 5-40 mL  5-40 mL IntraVENous 2 times per day    sodium chloride flush 0.9 % injection 5-40 mL  5-40 mL IntraVENous PRN    0.9 % sodium chloride infusion   IntraVENous PRN    polyethylene glycol (GLYCOLAX) packet 17 g  17 g Oral Daily PRN    amLODIPine (NORVASC) tablet 10 mg  10 mg Oral Daily    metoprolol succinate (TOPROL XL) extended release tablet 25 mg  25 mg Oral Daily    pantoprazole (PROTONIX) tablet 40 mg  40 mg  Oral Daily    hydrALAZINE (APRESOLINE) tablet 25 mg  25 mg Oral TID    calcium acetate (PHOSLO) tablet 667 mg  1 tablet Oral TID WC       Physical Exam: alert, appears stated age, asleep, and no distress  Head: Normocephalic, without obvious abnormality, atraumatic  Neck: no JVD   Lungs: CTA  Heart: regular rate and rhythm, no S3 or S4, and 2/6 holosystolic murmur left renal border A2 preserved  Abdomen: soft, non-tender; bowel sounds normal; no masses,  no organomegaly  Extremities:  No edema, Right upper extremity AV fistula with thrill and bruit   Neurologic: Grossly normal   HD access: RT UE AVF with bruit     Data Review:     LABS:  Recent Labs     09/27/22  0708 09/25/22  0130 09/24/22  0711   NA 136 128* 128*   K 4.6 4.5 3.9   CL 100 95* 93*   CO2 27 24 24    BUN 51* 75* 67*   CREATININE 11.60* 15.10* 13.50*   CALCIUM 8.3* 8.1* 8.1*  PHOS 4.4  --  3.4       Recent Labs     09/27/22  0708 09/25/22  0130 09/24/22  0711   WBC 3.4* 3.6 4.4   HGB 7.2* 7.4* 8.9*   HCT 22.5* 22.5* 26.7*   PLT 187 183 204       Micro: staph aureus in 2/2 bottles (09/23/22)    Radiology  US EXTREMITY LEFT NON VASC LIMITED    Result Date: 09/25/2022  Cellulitis of the left upper arm without evidence for drainable abscess. Electronically signed by Steele Sizer    CT CHEST W CONTRAST    Result Date: 09/25/2022  Diffuse inflammatory changes throughout both breasts, right greater than left; findings may represent cellulitis/mastitis, although underlying neoplasm is not excluded. Clinical follow-up is recommended to assure complete resolution. If findings persist despite treatment, diagnostic mammography would be recommended. No visualized abscess. Enlarged right axillary lymph nodes may be reactive; clinical follow-up is recommended. Electronically signed by Eual Fines    XR CHEST PORTABLE    Result Date: 09/23/2022  No acute process on portable chest. Electronically signed by Syliva Overman     Assessment:   Renal Specific Problems  ESRD on  HD  Hyponatremia- should correct post HD  Staph aureus bacteremia-echo negative for vegetation  Anemia renal failure-iron sat 32%  Hypertension with renal failure  Left upper extremity swelling/cellulitis.  no abscess by ultrasound        Plan:     HD  M-W-F while in house  HD today, the acute HD team is aware.   ABx  Oral iron  Started Epogen  ID following  Vascular input noted

## 2022-09-30 NOTE — Other (Signed)
Primary RN SBAR: Richardson Chiquito, RN  Patient Education provided: Procedural, eating during treatment  Incapacitated Nurse edu. provided: yes  Preferred Education method and Primary language: Verbal, [facility] associated wait time; reason: none    Hepatitis B Surface Ag   Date/Time Value Ref Range Status   05/07/2022 09:35 PM <0.10 Index Final     Hep B S Ag Interp   Date/Time Value Ref Range Status   05/07/2022 09:35 PM Negative NEG   Final     Hep B S Ab   Date/Time Value Ref Range Status   05/07/2022 09:35 PM 277.97 mIU/mL Final     Hep B S Ab Interp   Date/Time Value Ref Range Status   05/07/2022 09:35 PM REACTIVE (A) NR   Final       09/30/22 1315   Vital Signs   BP (!) 162/84   Temp 97.7 F (36.5 C)   Pulse 88   Respirations 18   SpO2 100 %   Weight - Scale 74.6 kg (164 lb 7.4 oz)   Weight Method   (Chart)   Percent Weight Change 0   Pain Assessment   Pain Assessment None - Denies Pain   Treatment   Time On 1315   Treatment Goal 2.5-3 L   Observations & Evaluations   Level of Consciousness 0   Oriented X 4   Respiratory Quality/Effort Unlabored   O2 Device None (Room air)   Skin Color Dusky   Skin Condition/Temp Dry;Warm   Appetite Good   Abdomen Inspection Soft   Edema Left upper extremity   LUE Edema +1   RLE Edema Trace   LLE Edema Trace   Comments Pt alert, appropriate, VSS, sitting in bed having lunch. Educated. Labs reviewed, procedure explained.   Technical Checks   Dialysis Machine No. 07   RO Machine Number [id number]   Dialyzer Lot No. [id number]   Tubing Lot Number [id number]   All Connections Secure Yes   NS Bag Yes   Saline Line Double Clamped Yes   Dialyzer Revaclear 300   Prime Volume (mL) 200 mL   ICEBOAT I;C;E;B;O;A;T   RO Machine Log Sheet Completed Yes   Machine Alarm Self Test Completed;Programme researcher, broadcasting/film/video Conductivity 13.3   Manual Conductivity 13.6   Manual Ph 7.4   Bleach Test (Neg) Yes   Bath Temperature  96.8 F (36 C)   Dialysis Bath   K+ (Potassium) 2   Ca+ (Calcium) 2.5   Na+ (Sodium) 138   HCO3 (Bicarb) 38   Bicarbonate Concentrate Lot No. [id number]   Acid Concentrate Lot No. [id number]   Treatment Initiation   Dialyze Hours 3.5   Treatment  Initiation Universal Precautions maintained;Lines secured to patient;Connections secured;Prime given;Venous Parameters set;Arterial Parameters set;IT consultant engaged;Saline line double clamped;Dialyzer;Revaclear Dialyzer;REV-300   Hemodialysis Fistula/Graft Arteriovenous fistula Right Arm   Placement Date/Time: 05/07/22 0820   Present on Admission/Arrival: Yes  Access Type: Arteriovenous fistula  Orientation: Right  Access Location: Arm   Site Assessment Clean, dry & intact   Thrill Present   Bruit Present   Status Accessed   Venous Needle Size 15 G   Arterial Needle Size 15 G   Accessed By: D. Aline August, RN   Access Attempts  1   Date of Last Dressing Change 09/30/22   Access Interventions Chlorhexidine;Aseptic Technique;Needles taped to patient   Dressing Intervention New  Dressing Status New dressing applied;Clean, dry & intact     Primary RN SBAR: Richardson Chiquito, RN  Comments: BFR 400 achieved. Tolerated tx well, UF goal achieved. At end all blood returned 300 mls NS, lines flushed w/ NS, needles removed / tips intact x2 and hand held pressure applied until hemostasis achieved. Dressing remains C/D/I on departure, +bruit/+thrill. No change in assessment post treatment. Pt remains in bed in lowest position, call bell in reach. Education & report with primary RN.      POST TREATMENT     09/30/22 1645   Vital Signs   BP (!) 170/82   Temp 97.4 F (36.3 C)   Pulse 82   Respirations 18   SpO2 96 %   Weight - Scale 71.6 kg (157 lb 13.6 oz)   Percent Weight Change -4.02   Pain Assessment   Pain Assessment None - Denies Pain   Post-Hemodialysis Assessment   Post-Treatment Procedures Blood returned;Access bleeding time < 10 minutes   Machine Disinfection Process Acid/Vinegar  Clean;Heat Disinfect;Exterior Engineer, agricultural Volume (ml) 300 ml   Blood Volume Processed (Liters) 78 L   Dialyzer Clearance Lightly streaked   Duration of Treatment (minutes) 210 minutes   Heparin Amount Administered During Treatment (mL) 0 mL   Hemodialysis Intake (ml) 500 ml   Hemodialysis Output (ml) 3500 ml   NET Removed (ml) 3000   Tolerated Treatment Good   Patient Response to Treatment UF goal achieved   Edema Left upper extremity   LUE Edema +1   Physician Notified No   Time Off 1645   Patient Disposition Return to room   Observations & Evaluations   Level of Consciousness 0   Oriented X 4   Respiratory Quality/Effort Unlabored   O2 Device None (Room air)   Skin Color Dusky   Skin Condition/Temp Dry;Warm   Appetite Good   Abdomen Inspection Soft      09/30/22 1645   Hemodialysis Fistula/Graft Arteriovenous fistula Right Arm   Placement Date/Time: 05/07/22 0820   Present on Admission/Arrival: Yes  Access Type: Arteriovenous fistula  Orientation: Right  Access Location: Arm   Site Assessment Clean, dry & intact   Thrill Present   Bruit Present   Status Deaccessed   Date of Last Dressing Change 09/30/22   Access Interventions Other (Comment)  (hemostasis post needle removal)   Dressing Intervention New   Dressing Status New dressing applied;Clean, dry & intact

## 2022-09-30 NOTE — Plan of Care (Signed)
Discharge IV Antibiotic Order  [facility]  [address], [place] [address]  [place], [place] [address]  TEL: [phone]  FAX: [phone]      1.  Diagnosis:  MSSA bacteremia  2.  Antibiotic:  IV ancef 2/2/3 gm after each dialysis       END DATE: 10/25/2022  3.  Weekly labs: per nephrologist with hemodialysis session   [x]  CBC/diff/platelets   [x]  BUN/Creatinine    []  CPK. Please hold your cholesterol medication (name ends with STATIN) while you are taking daily Daptomycin.    [x]   AST/Total bilirubin/Alkaline Phosphatase    [x]  CRP   [] Trough Vancomycin level goal 15-20. Drawn every Monday and Thursday. Clinical pharmacy consult for Vancomycin dosing according to the trough level.   4.  Allergies:    Allergies   Allergen Reactions    Ace Inhibitors Anaphylaxis     Pt states "all of these have closed my throat"    Acetaminophen Anaphylaxis     Pt states "all of these have closed my throat"    Codeine Anaphylaxis     Pt states "all of these have closed my throat"    Fentanyl Anaphylaxis    Hydrocodone Anaphylaxis     Pt states "all of these have closed my throat"    Nsaids Other (See Comments)     pcp states not to take due to kidneys     Oxycodone Anaphylaxis     Pt states "all of these have closed my throat"    Sulfa Antibiotics Anaphylaxis   5. All lab work to be follow by the nephrologist. If there's any questions then please contact us.       Madelynn Done, APRN - NP

## 2022-10-01 LAB — CBC WITH AUTO DIFFERENTIAL
Basophils %: 1 % (ref 0–1)
Basophils Absolute: 0 10*3/uL (ref 0.0–0.1)
Eosinophils %: 14 % — ABNORMAL HIGH (ref 0–7)
Eosinophils Absolute: 0.5 10*3/uL — ABNORMAL HIGH (ref 0.0–0.4)
Hematocrit: 25 % — ABNORMAL LOW (ref 35.0–47.0)
Hemoglobin: 8.2 g/dL — ABNORMAL LOW (ref 11.5–16.0)
Immature Granulocytes %: 0 %
Immature Granulocytes Absolute: 0 10*3/uL
Lymphocytes %: 19 % (ref 12–49)
Lymphocytes Absolute: 0.7 10*3/uL — ABNORMAL LOW (ref 0.8–3.5)
MCH: 30.6 PG (ref 26.0–34.0)
MCHC: 32.8 g/dL (ref 30.0–36.5)
MCV: 93.3 FL (ref 80.0–99.0)
MPV: 9.4 FL (ref 8.9–12.9)
Monocytes %: 17 % — ABNORMAL HIGH (ref 5–13)
Monocytes Absolute: 0.6 10*3/uL (ref 0.0–1.0)
Neutrophils %: 49 % (ref 32–75)
Neutrophils Absolute: 1.7 10*3/uL — ABNORMAL LOW (ref 1.8–8.0)
Nucleated RBCs: 0 PER 100 WBC
Platelets: 140 10*3/uL — ABNORMAL LOW (ref 150–400)
RBC: 2.68 M/uL — ABNORMAL LOW (ref 3.80–5.20)
RDW: 14.6 % — ABNORMAL HIGH (ref 11.5–14.5)
WBC: 3.5 10*3/uL — ABNORMAL LOW (ref 3.6–11.0)
nRBC: 0 10*3/uL (ref 0.00–0.01)

## 2022-10-01 LAB — BASIC METABOLIC PANEL
Anion Gap: 5 mmol/L (ref 5–15)
BUN/Creatinine Ratio: 4 — ABNORMAL LOW (ref 12–20)
BUN: 26 MG/DL — ABNORMAL HIGH (ref 6–20)
CO2: 32 mmol/L (ref 21–32)
Calcium: 9.1 MG/DL (ref 8.5–10.1)
Chloride: 97 mmol/L (ref 97–108)
Creatinine: 5.8 MG/DL — ABNORMAL HIGH (ref 0.55–1.02)
Est, Glom Filt Rate: 9 mL/min/{1.73_m2} — ABNORMAL LOW (ref >60)
Glucose: 86 mg/dL (ref 65–100)
Potassium: 4.2 mmol/L (ref 3.5–5.1)
Sodium: 134 mmol/L — ABNORMAL LOW (ref 136–145)

## 2022-10-01 MED FILL — HYDRALAZINE HCL 25 MG PO TABS: 25 MG | ORAL | Qty: 1

## 2022-10-01 MED FILL — AMLODIPINE BESYLATE 5 MG PO TABS: 5 MG | ORAL | Qty: 2

## 2022-10-01 MED FILL — CEFAZOLIN SODIUM 1 G IJ SOLR: 1 g | INTRAMUSCULAR | Qty: 1000

## 2022-10-01 MED FILL — CALCIUM ACETATE (PHOS BINDER) 667 MG PO TABS: 667 MG | ORAL | Qty: 1

## 2022-10-01 MED FILL — FEROSUL 325 (65 FE) MG PO TABS: 325 (65 Fe) MG | ORAL | Qty: 1

## 2022-10-01 MED FILL — PROMETHAZINE HCL 25 MG PO TABS: 25 MG | ORAL | Qty: 1

## 2022-10-01 MED FILL — HYDROMORPHONE HCL 1 MG/ML IJ SOLN: 1 MG/ML | INTRAMUSCULAR | Qty: 1

## 2022-10-01 MED FILL — METOPROLOL SUCCINATE ER 25 MG PO TB24: 25 MG | ORAL | Qty: 1

## 2022-10-01 MED FILL — PANTOPRAZOLE SODIUM 40 MG PO TBEC: 40 MG | ORAL | Qty: 1

## 2022-10-01 NOTE — Progress Notes (Signed)
[facility]     Renal Daily Progress Note:     Subjective: Completed dialysis early this AM. Tolerated treatment. No complaints. Surgery advises post dialysis ATB, no surgical intervention right now. She would like to be M-WF  Not SOB, no chest pain, left arm still tender, no N/V or abd pain    7/8-feels ok, no complaints, HD later today  7/9-seen on hemodialysis.  Blood pressure stable.  Dialysis was changed to this morning due to scheduling issues.  7/10-no new complaints.  No labs done this morning looks like patient refused  09/30/22 Resting in bed, refused labs earlier  7/13-feels ok, no complaints. Had HD yesterday    Review of Systems  Pertinent items are noted in HPI.    Objective:     BP (!) 164/89   Pulse 85   Temp 98.2 F (36.8 C) (Oral)   Resp 17   Wt 70.6 kg (155 lb 10.3 oz)   SpO2 97%   BMI 25.12 kg/m   Temp (24hrs), Avg:97.7 F (36.5 C), Min:97.4 F (36.3 C), Max:98.2 F (36.8 C)        Intake/Output Summary (Last 24 hours) at 10/01/2022 1102  Last data filed at 09/30/2022 1645  Gross per 24 hour   Intake 500 ml   Output 3500 ml   Net -3000 ml       Current Facility-Administered Medications   Medication Dose Route Frequency    HYDROmorphone HCl PF (DILAUDID) injection 0.5 mg  0.5 mg IntraVENous Q6H PRN    epoetin alfa-epbx (RETACRIT) injection 10,000 Units  10,000 Units SubCUTAneous Once per day on Mon Wed Fri    ferrous sulfate (IRON 325) tablet 325 mg  325 mg Oral BID WC    diphenhydrAMINE (BENADRYL) capsule 50 mg  50 mg Oral Q8H PRN    ceFAZolin (ANCEF) 1,000 mg in sterile water 10 mL IV syringe  1,000 mg IntraVENous Q24H    promethazine (PHENERGAN) tablet 25 mg  25 mg Oral Q6H PRN    sodium chloride flush 0.9 % injection 5-40 mL  5-40 mL IntraVENous 2 times per day    sodium chloride flush 0.9 % injection 5-40 mL  5-40 mL IntraVENous PRN    0.9 % sodium chloride infusion   IntraVENous PRN    polyethylene glycol (GLYCOLAX) packet 17 g  17 g Oral Daily PRN    amLODIPine (NORVASC)  tablet 10 mg  10 mg Oral Daily    metoprolol succinate (TOPROL XL) extended release tablet 25 mg  25 mg Oral Daily    pantoprazole (PROTONIX) tablet 40 mg  40 mg Oral Daily    hydrALAZINE (APRESOLINE) tablet 25 mg  25 mg Oral TID    calcium acetate (PHOSLO) tablet 667 mg  1 tablet Oral TID WC       Physical Exam: alert, appears stated age, asleep, and no distress  Head: Normocephalic, without obvious abnormality, atraumatic  Neck: no JVD   Lungs: CTA  Heart: regular rate and rhythm, no S3 or S4, and 2/6 holosystolic murmur left renal border A2 preserved  Abdomen: soft, non-tender; bowel sounds normal; no masses,  no organomegaly  Extremities:  No edema, Right upper extremity AV fistula with thrill and bruit   Neurologic: Grossly normal   HD access: RT UE AVF with bruit     Data Review:     LABS:  Recent Labs     10/01/22  0531 09/30/22  1201 09/27/22  0708 09/25/22  0130 09/24/22  0711   NA 134* 133* 136   < > 128*   K 4.2 4.2 4.6   < > 3.9   CL 97 95* 100   < > 93*   CO2 32 30 27   < > 24   BUN 26* 48* 51*   < > 67*   CREATININE 5.80* 9.00* 11.60*   < > 13.50*   CALCIUM 9.1 8.8 8.3*   < > 8.1*   PHOS  --   --  4.4  --  3.4    < > = values in this interval not displayed.       Recent Labs     10/01/22  0531 09/30/22  1201 09/27/22  0708   WBC 3.5* 4.1 3.4*   HGB 8.2* 8.1* 7.2*   HCT 25.0* 25.3* 22.5*   PLT 140* 158 187       Micro: staph aureus in 2/2 bottles (09/23/22)    Radiology  US EXTREMITY LEFT NON VASC LIMITED    Result Date: 09/25/2022  Cellulitis of the left upper arm without evidence for drainable abscess. Electronically signed by Steele Sizer    CT CHEST W CONTRAST    Result Date: 09/25/2022  Diffuse inflammatory changes throughout both breasts, right greater than left; findings may represent cellulitis/mastitis, although underlying neoplasm is not excluded. Clinical follow-up is recommended to assure complete resolution. If findings persist despite treatment, diagnostic mammography would be recommended. No  visualized abscess. Enlarged right axillary lymph nodes may be reactive; clinical follow-up is recommended. Electronically signed by Eual Fines    XR CHEST PORTABLE    Result Date: 09/23/2022  No acute process on portable chest. Electronically signed by Syliva Overman     Assessment:   Renal Specific Problems  ESRD on HD  Hyponatremia- should correct post HD  Staph aureus bacteremia-echo negative for vegetation-f/u BC NGTD  Anemia renal failure-iron sat 32%  Hypertension with renal failure  Left upper extremity swelling/cellulitis.  no abscess by ultrasound        Plan:     HD  M-W-F while in house  ABx  Oral iron  Started Epogen  ID following  Vascular input noted

## 2022-10-01 NOTE — Progress Notes (Cosign Needed)
[facility]                                                                                          Hospitalist Progress Note  Dorita Rowlands, APRN - NP  Answering service: [phone] OR [phone] from in house phone        Date of Service:  10/01/2022  NAME:  Meredith Hawkins  DOB:  08-29-1985  MRN:  [id number]       Admission Summary:   As per H&P: Meredith Hawkins is a 37 y.o. 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  Patient noted onset of symptoms starting about 1 week prior to arrival. Symptoms notably have remained constant, severe, aching, aggravated with touch, without alleviating factors. Patient is resident of [place], [place] but she is visiting here in [place].       Per chart review: Patient was recent hospitalized at [facility] on 09/16/2022 complaining of right-sided flank, and swelling and pain of right breast, anemia (transfused 2 units of blood with hemoglobin 6.4); was seen by GI with plan for video capsule endoscopy but patient left AMA.  Over the past year, she has had multiple hospitalizations but each discharge record notes Eliquis on discharge medication list.  There have been no other further reports of GI bleed.  Patient does not complain of any hematemesis, hematochezia, or melena or other bleeding over the past week prior to arrival.   She presented to the ER on 7/5 with the aforementioned complaints.  She reportedly was last dialyzed on 09/20/2022 in the ED at outside hospital in [place].  She notes that she does not go to a dialysis center for hemodialysis due to "personal reasons".  Patient went to [facility] ED 7/5 where workup included labs showing hemoglobin 7.9, neutrophils 84%, sodium 125, BUN 62, creatinine 11.98, GFR 4, calcium 8.3, albumin 3.0, alkaline phosphatase 135.  12-lead EKG shows sinus rhythm, first-degree AV block at 72 beats minute.  ED ordered Dilaudid 1 mg IV x 2 + 0.5 mg IV x 1, Benadryl 25 mg IV x 1, Protonix 40 mg IV x 1, ceftriaxone 1000 mg IV x 1, and vancomycin 1000 mg IV x 1 dose.  ED performed limited bedside ultrasound of soft tissues left upper extremity showing diffuse cobblestoning consistent with cellulitis but no fluid collection consistent with abscess.  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  ED requested transfer to [facility].     Patient reports that she has been banned from all local area dialysis centers in [place] where she lives. She goes  to the [facility] ER 3x/week for her dialysis. She arrived in [place] on Wednesday, 7/3 Has not had dialysis since last Monday, 7/1.  Interval history / Subjective:      Follow BC from 7/12 for clearance  Pt w/o new complaints  On phone, does not look up, does not make eye contact  Spoke w/ transfer center: declined by [facility] and [facility], Transfer center trying [facility]    ADDENDUM: Declined by [facility] due to capacity    Assessment & Plan:     Cellulitis of left upper extremity  MSSA Bacteremia   -Ultrasound of LUE obtained which shows cellulitis infection, no drainable abscess   -Area of induration to left upper extremity   -CT scan of chest w/wo contrast obtained which confirms no abscess in the LUE  -Paired blood cultures 7/5 with reported MSSA   -ID following: Continue  with Ancef  -Repeat blood cultures on 7/7: probable staphlococcus species  -ECHO negative for vegetation  -see ID note from 7/12 for IV abx plan     Right Breast Skin Changes   -Chest CT w/wo contrast (7/7)- consistant with lymphadenopathy of right axilla and inflammatory/infectious skin changes of bilateral breasts which could be mastitis vs. cellulitis, malignancy cannot be ruled out, therefore mammogram outpatient is recommended   -Lymphadenopathy and thickened skin noted      ESRD (end stage renal disease)  -Reportedly does not follow-up with outpatient nephrologist or dialysis center. Patient states she takes a bus and goes to the [facility] ED for her dialysis.  -Nephrology following: continue epogen  -HD ordered here for MWF      Anemia of chronic disease  -Last Hemoglobin 7.2 (7/9) which is slightly lower than patient's baseline but increased compared to hemoglobin 6.4 reported on recent hospitalization. Patient continues to refuse labs.  -Repeat H&H if patient will allow.  -Fecal occult blood test ordered  -Transfuse if hemoglobin less than 7.0     Acute hyponatremia (Resolved)  -Initial serum sodium 125 --> 128. (7/9) 136.  -If able to make any urine, send urine sodium and osmolality.     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  Reduced to 0.5 mg IV every 4 hours as needed for severe pain     Code status: Full   Prophylaxis: SCDs  Care Plan discussed with: Patient, RN, Attending  Anticipated Disposition: TBD  [facility], [facility], [facility].     Principal Problem:    Cellulitis of left upper extremity  Resolved Problems:    * No  resolved hospital problems. *    Social Determinants of Health     Tobacco Use: Low Risk  (05/07/2022)    Patient History     Smoking Tobacco Use: Never     Smokeless Tobacco Use: Never     Passive Exposure: Not on file   Alcohol Use: Not At Risk (09/23/2022)    AUDIT-C     Frequency of Alcohol Consumption: Never     Average Number of Drinks: Patient does not drink     Frequency of Binge Drinking: Never  Financial Resource Strain: Not on file   Food Insecurity: Not on file   Transportation Needs: Not on file   Physical Activity: Not on file   Stress: Not on file   Social Connections: Moderately Integrated (10/01/2022)    Social Connections [facility])     If for any reason you need help with day-to-day activities such as bathing, preparing meals, shopping, managing finances, etc., do you get the help you need?: Not on file   Intimate Partner Violence: Not on file   Depression: Not on file   Housing Stability: Not on file   Interpersonal Safety: Not At Risk (09/23/2022)    Interpersonal Safety Domain Source: IP Abuse Screening     Physical abuse: Denies     Verbal abuse: Denies     Emotional abuse: Denies     Financial abuse: Denies     Sexual abuse: Denies   Utilities: Not on file       Review of Systems:   Pertinent items are noted in HPI.       Vital Signs:    Last 24hrs VS reviewed since prior progress note. Most recent are:  Vitals:    10/01/22 0755   BP: (!) 164/89   Pulse: 74   Resp: 17   Temp: 98.2 F (36.8 C)   SpO2: 97%         Intake/Output Summary (Last 24 hours) at 10/01/2022 0858  Last data filed at 09/30/2022 1645  Gross per 24 hour   Intake 500 ml   Output 3500 ml   Net -3000 ml        Physical Examination:     I had a face to face encounter with this patient and independently examined them on 10/01/2022 as outlined below:    General : alert x 3, awake, no acute distress  HEENT: EOMI, moist mucus membrane  Neck: supple, no JVD  Chest: Clear to auscultation bilaterally   Right breast: with peau d'orange  changes noted to skin, lymphadenopathy to right axilla, right breast larger than left   CVS: RRR, S1 S2 heard  Right fistula with + bruit and + thrill   Abd: soft/ non tender, non distended  Ext: no clubbing, no cyanosis, no edema, left upper extremity tender to touch   Neuro/Psych: irritable mood and affect, MAE spontaneously  Skin: warm     Data Review:    Review and/or order of clinical lab test  Review and/or order of tests in the radiology section of CPT  Review and/or order of tests in the medicine section of CPT      I have personally and independently reviewed all pertinent labs, diagnostic studies, imaging, and have provided independent interpretation of the same.     Labs:     Recent Labs     09/30/22  1201 10/01/22  0531   WBC 4.1 3.5*   HGB 8.1* 8.2*   HCT 25.3* 25.0*   PLT 158 140*       Recent Labs     09/30/22  1201 10/01/22  0531   NA 133* 134*   K 4.2 4.2   CL 95* 97   CO2 30 32   BUN 48* 26*       No results for input(s): "TIBC" in the last 72 hours.    Invalid input(s): "FE", "PSAT", "FERR"    Notes reviewed from all clinical/nonclinical/nursing services involved in patient's clinical care. Care coordination discussions were held with appropriate clinical/nonclinical/  nursing providers based on care coordination needs.     Patients current active Medications were reviewed, considered, added and adjusted based on the clinical condition today.      Home Medications were reconciled to the best of my ability given all available resources at the time of admission. Route is PO if not otherwise noted.    Medications Reviewed:     Current Facility-Administered Medications   Medication Dose Route Frequency    HYDROmorphone HCl PF (DILAUDID) injection 0.5 mg  0.5 mg IntraVENous Q6H PRN    epoetin alfa-epbx (RETACRIT) injection 10,000 Units  10,000 Units SubCUTAneous Once per day on Mon Wed Fri    ferrous sulfate (IRON 325) tablet 325 mg  325 mg Oral BID WC    diphenhydrAMINE (BENADRYL) capsule 50 mg  50 mg  Oral Q8H PRN    ceFAZolin (ANCEF) 1,000 mg in sterile water 10 mL IV syringe  1,000 mg IntraVENous Q24H    promethazine (PHENERGAN) tablet 25 mg  25 mg Oral Q6H PRN    sodium chloride flush 0.9 % injection 5-40 mL  5-40 mL IntraVENous 2 times per day    sodium chloride flush 0.9 % injection 5-40 mL  5-40 mL IntraVENous PRN    0.9 % sodium chloride infusion   IntraVENous PRN    polyethylene glycol (GLYCOLAX) packet 17 g  17 g Oral Daily PRN    amLODIPine (NORVASC) tablet 10 mg  10 mg Oral Daily    metoprolol succinate (TOPROL XL) extended release tablet 25 mg  25 mg Oral Daily    pantoprazole (PROTONIX) tablet 40 mg  40 mg Oral Daily    hydrALAZINE (APRESOLINE) tablet 25 mg  25 mg Oral TID    calcium acetate (PHOSLO) tablet 667 mg  1 tablet Oral TID WC     ______________________________________________________________________  EXPECTED LENGTH OF STAY: Unable to retrieve estimated LOS  ACTUAL LENGTH OF STAY:          8                 Tery Hoeger, APRN - NP

## 2022-10-02 LAB — CBC WITH AUTO DIFFERENTIAL
Basophils %: 1 % (ref 0–1)
Basophils Absolute: 0 10*3/uL (ref 0.0–0.1)
Eosinophils %: 9 % — ABNORMAL HIGH (ref 0–7)
Eosinophils Absolute: 0.4 10*3/uL (ref 0.0–0.4)
Hematocrit: 26.1 % — ABNORMAL LOW (ref 35.0–47.0)
Hemoglobin: 8.3 g/dL — ABNORMAL LOW (ref 11.5–16.0)
Immature Granulocytes %: 1 % — ABNORMAL HIGH (ref 0.0–0.5)
Immature Granulocytes Absolute: 0 10*3/uL (ref 0.00–0.04)
Lymphocytes %: 17 % (ref 12–49)
Lymphocytes Absolute: 0.7 10*3/uL — ABNORMAL LOW (ref 0.8–3.5)
MCH: 30.5 PG (ref 26.0–34.0)
MCHC: 31.8 g/dL (ref 30.0–36.5)
MCV: 96 FL (ref 80.0–99.0)
MPV: 9.6 FL (ref 8.9–12.9)
Monocytes %: 12 % (ref 5–13)
Monocytes Absolute: 0.5 10*3/uL (ref 0.0–1.0)
Neutrophils %: 60 % (ref 32–75)
Neutrophils Absolute: 2.4 10*3/uL (ref 1.8–8.0)
Nucleated RBCs: 0 PER 100 WBC
Platelets: 144 10*3/uL — ABNORMAL LOW (ref 150–400)
RBC: 2.72 M/uL — ABNORMAL LOW (ref 3.80–5.20)
RDW: 14.5 % (ref 11.5–14.5)
WBC: 4 10*3/uL (ref 3.6–11.0)
nRBC: 0 10*3/uL (ref 0.00–0.01)

## 2022-10-02 LAB — BASIC METABOLIC PANEL
Anion Gap: 9 mmol/L (ref 5–15)
BUN/Creatinine Ratio: 5 — ABNORMAL LOW (ref 12–20)
BUN: 43 MG/DL — ABNORMAL HIGH (ref 6–20)
CO2: 30 mmol/L (ref 21–32)
Calcium: 8.8 MG/DL (ref 8.5–10.1)
Chloride: 94 mmol/L — ABNORMAL LOW (ref 97–108)
Creatinine: 8.59 MG/DL — ABNORMAL HIGH (ref 0.55–1.02)
Est, Glom Filt Rate: 6 mL/min/{1.73_m2} — ABNORMAL LOW (ref >60)
Glucose: 81 mg/dL (ref 65–100)
Potassium: 4.9 mmol/L (ref 3.5–5.1)
Sodium: 133 mmol/L — ABNORMAL LOW (ref 136–145)

## 2022-10-02 MED FILL — HYDROMORPHONE HCL 1 MG/ML IJ SOLN: 1 MG/ML | INTRAMUSCULAR | Qty: 1

## 2022-10-02 MED FILL — CEFAZOLIN SODIUM 1 G IJ SOLR: 1 g | INTRAMUSCULAR | Qty: 1000

## 2022-10-02 MED FILL — HYDRALAZINE HCL 25 MG PO TABS: 25 MG | ORAL | Qty: 1

## 2022-10-02 MED FILL — PROMETHAZINE HCL 25 MG PO TABS: 25 MG | ORAL | Qty: 1

## 2022-10-02 MED FILL — AMLODIPINE BESYLATE 5 MG PO TABS: 5 MG | ORAL | Qty: 2

## 2022-10-02 MED FILL — FEROSUL 325 (65 FE) MG PO TABS: 325 (65 Fe) MG | ORAL | Qty: 1

## 2022-10-02 MED FILL — CALCIUM ACETATE (PHOS BINDER) 667 MG PO TABS: 667 MG | ORAL | Qty: 1

## 2022-10-02 MED FILL — METOPROLOL SUCCINATE ER 25 MG PO TB24: 25 MG | ORAL | Qty: 1

## 2022-10-02 MED FILL — PANTOPRAZOLE SODIUM 40 MG PO TBEC: 40 MG | ORAL | Qty: 1

## 2022-10-02 NOTE — Progress Notes (Cosign Needed)
[facility]                                                                                          Hospitalist Progress Note  Bailee Metter, APRN - NP  Answering service: [phone] OR [phone] from in house phone        Date of Service:  10/02/2022  NAME:  Meredith Hawkins  DOB:  1986/01/29  MRN:  [id number]       Admission Summary:   As per H&P: [name] is a 37 y.o. 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  Patient noted onset of symptoms starting about 1 week prior to arrival. Symptoms notably have remained constant, severe, aching, aggravated with touch, without alleviating factors. Patient is resident of [place], [place] but she is visiting here in [place].       Per chart review: Patient was recent hospitalized at [facility] on 09/16/2022 complaining of right-sided flank, and swelling and pain of right breast, anemia (transfused 2 units of blood with hemoglobin 6.4); was seen by GI with plan for video capsule endoscopy but patient left AMA.  Over the past year, she has had multiple hospitalizations but each discharge record notes Eliquis on discharge medication list.  There have been no other further reports of GI bleed.  Patient does not complain of any hematemesis, hematochezia, or melena or other bleeding over the past week prior to arrival.   She presented to the ER on 7/5 with the aforementioned complaints.  She reportedly was last dialyzed on 09/20/2022 in the ED at outside hospital in [place].  She notes that she does not go to a dialysis center for hemodialysis due to "personal reasons".  Patient went to [facility] ED 7/5 where workup included labs showing hemoglobin 7.9, neutrophils 84%, sodium 125, BUN 62, creatinine 11.98, GFR 4, calcium 8.3, albumin 3.0, alkaline phosphatase 135.  12-lead EKG shows sinus rhythm, first-degree AV block at 72 beats minute.  ED ordered Dilaudid 1 mg IV x 2 + 0.5 mg IV x 1, Benadryl 25 mg IV x 1, Protonix 40 mg IV x 1, ceftriaxone 1000 mg IV x 1, and vancomycin 1000 mg IV x 1 dose.  ED performed limited bedside ultrasound of soft tissues left upper extremity showing diffuse cobblestoning consistent with cellulitis but no fluid collection consistent with abscess.  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  ED requested transfer to [facility].     Patient reports that she has been banned from all local area dialysis centers in [place] where she lives. She goes  to the [facility] ER 3x/week for her dialysis. She arrived in [place] on Wednesday, 7/3 Has not had dialysis since last Monday, 7/1.  Interval history / Subjective:      BC from 7/12 now clear  No safe disposition, lives in [place], no acceptance by 3 facilities, does not have regular HD chair or nephrologist. Needs to be escalated, Dr. Christie Beckers aware. Pt will not stay beyond July 26 as she has bills to pay, she also declines going to [facility] for HD and IV abx.    Pt with ongoing bilateral arm pain, generalized abd pain, multiple allergies to medications    Assessment & Plan:     Cellulitis of left upper extremity  MSSA Bacteremia   -Ultrasound of LUE obtained which shows cellulitis infection, no drainable abscess   -Area of induration to left upper extremity   -CT scan of chest w/wo  contrast obtained which confirms no abscess in the LUE  -Paired blood cultures 7/5 with reported MSSA   -ID following: Continue with Ancef  -Repeat blood cultures on 7/7: probable staphlococcus species  -ECHO negative for vegetation  -see ID note from 7/12 for IV abx plan     Right Breast Skin Changes   -Chest CT w/wo contrast (7/7)- consistant with lymphadenopathy of right axilla and inflammatory/infectious skin changes of bilateral breasts which could be mastitis vs. cellulitis, malignancy cannot be ruled out, therefore mammogram outpatient is recommended   -Lymphadenopathy and thickened skin noted      ESRD (end stage renal disease)  -Reportedly does not follow-up with outpatient nephrologist or dialysis center. Patient states she takes a bus and goes to the [facility] ED for her dialysis.  -Nephrology following: continue epogen  -HD ordered here for MWF      Anemia of chronic disease  -Last Hemoglobin 7.2 (7/9) which is slightly lower than patient's baseline but increased compared to hemoglobin 6.4 reported on recent hospitalization. Patient continues to refuse labs.  -Repeat H&H if patient will allow.  -Fecal occult blood test ordered and never sent  -Transfuse if hemoglobin less than 7.0     Acute hyponatremia (Resolved)  -Initial serum sodium 125 --> 128. (7/9) 136.  -If able to make any urine, send urine sodium and osmolality.     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  Reduced to 0.5 mg IV every 6 hours as needed for severe pain     Code status: Full   Prophylaxis: SCDs  Care Plan discussed with: Patient, RN, Attending  Anticipated Disposition: TBD  [facility], [facility] and [facility]  has declined transfer  Pt declines [facility] as an option     Principal Problem:    Cellulitis of left upper extremity  Resolved Problems:    * No resolved hospital problems. *    Social Determinants of Health     Tobacco Use: Low Risk  (05/07/2022)    Patient History     Smoking Tobacco Use: Never     Smokeless Tobacco Use: Never     Passive Exposure: Not on file   Alcohol Use: Not At Risk (09/23/2022)    AUDIT-C  Frequency of Alcohol Consumption: Never     Average Number of Drinks: Patient does not drink     Frequency of Binge Drinking: Never   Financial Resource Strain: Not on file   Food Insecurity: Not on file   Transportation Needs: Not on file   Physical Activity: Not on file   Stress: Not on file   Social Connections: Moderately Integrated (10/02/2022)    Social Connections [facility])     If for any reason you need help with day-to-day activities such as bathing, preparing meals, shopping, managing finances, etc., do you get the help you need?: Not on file   Intimate Partner Violence: Not on file   Depression: Not on file   Housing Stability: Not on file   Interpersonal Safety: Not At Risk (09/23/2022)    Interpersonal Safety Domain Source: IP Abuse Screening     Physical abuse: Denies     Verbal abuse: Denies     Emotional abuse: Denies     Financial abuse: Denies     Sexual abuse: Denies   Utilities: Not on file       Review of Systems:   Pertinent items are noted in HPI.       Vital Signs:    Last 24hrs VS reviewed since prior progress note. Most recent are:  Vitals:    10/02/22 1010   BP:    Pulse: 89   Resp:    Temp:    SpO2:        No intake or output data in the 24 hours ending 10/02/22 1105       Physical Examination:     I had a face to face encounter with this patient and independently examined them on 10/02/2022 as outlined below:    General : alert x 3, awake, no acute distress  HEENT: EOMI, moist mucus membrane  Neck: supple, no JVD  Chest: Clear to auscultation bilaterally   Right breast: with  peau d'orange changes noted to skin, lymphadenopathy to right axilla, right breast larger than left   CVS: RRR, S1 S2 heard  Abd: soft/ non tender, non distended  Ext: no clubbing, no cyanosis, no edema, left upper extremity tender to touch   Neuro/Psych: irritable mood and affect, MAE spontaneously  Skin: warm   Vascular: Right fistula with + bruit/ thrill     Data Review:    Review and/or order of clinical lab test  Review and/or order of tests in the radiology section of CPT  Review and/or order of tests in the medicine section of CPT      I have personally and independently reviewed all pertinent labs, diagnostic studies, imaging, and have provided independent interpretation of the same.     Labs:     Recent Labs     10/01/22  0531 10/02/22  0533   WBC 3.5* 4.0   HGB 8.2* 8.3*   HCT 25.0* 26.1*   PLT 140* 144*       Recent Labs     09/30/22  1201 10/01/22  0531 10/02/22  0533   NA 133* 134* 133*   K 4.2 4.2 4.9   CL 95* 97 94*   CO2 30 32 30   BUN 48* 26* 43*       No results for input(s): "TIBC" in the last 72 hours.    Invalid input(s): "FE", "PSAT", "FERR"    Notes reviewed from all clinical/nonclinical/nursing services involved in patient's clinical care. Care coordination discussions  were held with appropriate clinical/nonclinical/ nursing providers based on care coordination needs.     Patients current active Medications were reviewed, considered, added and adjusted based on the clinical condition today.      Home Medications were reconciled to the best of my ability given all available resources at the time of admission. Route is PO if not otherwise noted.    Medications Reviewed:     Current Facility-Administered Medications   Medication Dose Route Frequency    HYDROmorphone HCl PF (DILAUDID) injection 0.5 mg  0.5 mg IntraVENous Q6H PRN    epoetin alfa-epbx (RETACRIT) injection 10,000 Units  10,000 Units SubCUTAneous Once per day on Mon Wed Fri    ferrous sulfate (IRON 325) tablet 325 mg  325 mg Oral BID  WC    diphenhydrAMINE (BENADRYL) capsule 50 mg  50 mg Oral Q8H PRN    ceFAZolin (ANCEF) 1,000 mg in sterile water 10 mL IV syringe  1,000 mg IntraVENous Q24H    promethazine (PHENERGAN) tablet 25 mg  25 mg Oral Q6H PRN    sodium chloride flush 0.9 % injection 5-40 mL  5-40 mL IntraVENous 2 times per day    sodium chloride flush 0.9 % injection 5-40 mL  5-40 mL IntraVENous PRN    0.9 % sodium chloride infusion   IntraVENous PRN    polyethylene glycol (GLYCOLAX) packet 17 g  17 g Oral Daily PRN    amLODIPine (NORVASC) tablet 10 mg  10 mg Oral Daily    metoprolol succinate (TOPROL XL) extended release tablet 25 mg  25 mg Oral Daily    pantoprazole (PROTONIX) tablet 40 mg  40 mg Oral Daily    hydrALAZINE (APRESOLINE) tablet 25 mg  25 mg Oral TID    calcium acetate (PHOSLO) tablet 667 mg  1 tablet Oral TID WC     ______________________________________________________________________  EXPECTED LENGTH OF STAY: Unable to retrieve estimated LOS  ACTUAL LENGTH OF STAY:          9                 Emmali Karow, APRN - NP

## 2022-10-03 LAB — RENAL FUNCTION PANEL
Albumin: 3 g/dL — ABNORMAL LOW (ref 3.5–5.0)
Anion Gap: 13 mmol/L (ref 5–15)
BUN/Creatinine Ratio: 5 — ABNORMAL LOW (ref 12–20)
BUN: 58 MG/DL — ABNORMAL HIGH (ref 6–20)
CO2: 26 mmol/L (ref 21–32)
Calcium: 8.1 MG/DL — ABNORMAL LOW (ref 8.5–10.1)
Chloride: 96 mmol/L — ABNORMAL LOW (ref 97–108)
Creatinine: 11.1 MG/DL — ABNORMAL HIGH (ref 0.55–1.02)
Est, Glom Filt Rate: 4 mL/min/{1.73_m2} — ABNORMAL LOW (ref >60)
Glucose: 105 mg/dL — ABNORMAL HIGH (ref 65–100)
Phosphorus: 4.4 MG/DL (ref 2.6–4.7)
Potassium: 4.1 mmol/L (ref 3.5–5.1)
Sodium: 135 mmol/L — ABNORMAL LOW (ref 136–145)

## 2022-10-03 MED FILL — CALCIUM ACETATE (PHOS BINDER) 667 MG PO TABS: 667 MG | ORAL | Qty: 1

## 2022-10-03 MED FILL — PROMETHAZINE HCL 25 MG PO TABS: 25 MG | ORAL | Qty: 1

## 2022-10-03 MED FILL — HYDRALAZINE HCL 25 MG PO TABS: 25 MG | ORAL | Qty: 1

## 2022-10-03 MED FILL — FEROSUL 325 (65 FE) MG PO TABS: 325 (65 Fe) MG | ORAL | Qty: 1

## 2022-10-03 MED FILL — HYDROMORPHONE HCL 1 MG/ML IJ SOLN: 1 MG/ML | INTRAMUSCULAR | Qty: 1

## 2022-10-03 MED FILL — METOPROLOL SUCCINATE ER 25 MG PO TB24: 25 MG | ORAL | Qty: 1

## 2022-10-03 MED FILL — RETACRIT 10000 UNIT/ML IJ SOLN: 10000 UNIT/ML | INTRAMUSCULAR | Qty: 1

## 2022-10-03 MED FILL — AMLODIPINE BESYLATE 5 MG PO TABS: 5 MG | ORAL | Qty: 2

## 2022-10-03 MED FILL — PANTOPRAZOLE SODIUM 40 MG PO TBEC: 40 MG | ORAL | Qty: 1

## 2022-10-03 NOTE — Other (Signed)
DIALYSIS ETA 1230 today

## 2022-10-03 NOTE — Other (Signed)
Malachi Pro, PA-C  Physician Assistant  Hospitalist     Progress Notes      Signed     Date of Service: 09/29/2022  4:10 PM     Signed                    [facility]                                                                                                                                                  Hospitalist Progress Note  Malachi Pro, [place]  Answering service: [phone] OR [phone] from in house phone         Date of Service:  09/29/2022  NAME:  Meredith Hawkins  DOB:  [birth year]  MRN:  [id number]        Admission Summary:   As per H&P: Meredith Hawkins is a 37 y.o. 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  Patient noted onset of symptoms starting about 1 week prior to arrival. Symptoms notably have remained constant, severe, aching, aggravated with touch, without alleviating factors. Patient is resident of [place], [place] but she is visiting here in [place].       Per chart review: Patient was recent hospitalized at [facility] on 09/16/2022 complaining of right-sided flank, and swelling and pain of right breast, anemia (transfused 2 units of blood with hemoglobin 6.4); was seen by GI with plan for video capsule endoscopy but patient left AMA.  Over the past year, she has had multiple hospitalizations but each discharge record notes Eliquis on  discharge medication list.  There have been no other further reports of GI bleed.  Patient does not complain of any hematemesis, hematochezia, or melena or other bleeding over the past week prior to arrival.  She presented to the ER on 7/5 with the aforementioned complaints.  She reportedly was last dialyzed on 09/20/2022 in the ED at outside hospital in [place].  She notes that she does not go to a dialysis center for hemodialysis due to "personal reasons".  Patient went to [facility] ED 7/5 where workup included labs showing hemoglobin 7.9, neutrophils 84%, sodium 125, BUN 62, creatinine 11.98, GFR 4, calcium 8.3, albumin 3.0, alkaline phosphatase 135.  12-lead EKG shows sinus rhythm, first-degree AV block at 72 beats minute.  ED ordered  Dilaudid 1 mg IV x 2 + 0.5 mg IV x 1, Benadryl 25 mg IV x 1, Protonix 40 mg IV x 1, ceftriaxone 1000 mg IV x 1, and vancomycin 1000 mg IV x 1 dose.  ED performed limited bedside ultrasound of soft tissues left upper extremity showing diffuse cobblestoning consistent with cellulitis but no fluid collection consistent with abscess.  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  ED requested transfer to [facility].     Patient reports that she has been banned from all local area dialysis centers in [place] where she lives. She goes to the [facility] ER 3x/week for her dialysis. She arrived in [place] on Wednesday, 7/3 Has not had dialysis since last Monday, 7/1.  Interval history / Subjective:   Upon seeing the patient on rounds this morning, patient was sitting up in her bed eating breakfast. Patient states she still has pain in "all the same places." Patient states she is less itchy today. Patient states her breasts look the same with regards to swelling yesterday. I asked the patient about her follow up for dialysis once discharged and she told me she has experienced multiple sites in [place] being racist  towards her and telling her she has "behavioral problems." The patient states she does not have behavioral problems and does not like being disrespected like that. Patient states there is a dialysis center she can go to but it is 1.5 hours from her house and she does not have transportation to get there, so that is not an option. Per the patient, her nephrologist told her to come to the [facility] ED for her dialysis which the patient states she does. Patient denies any further complaints at this time.      Assessment & Plan:      Cellulitis of left upper extremity  MSSA Bacteremia   -Ultrasound of LUE obtained which shows cellulitis infection, no drainable abscess   -Area of induration to left upper extremity   -CT scan of chest w/wo contrast obtained which confirms no abscess in the LUE  -Paired blood cultures 7/5 with reported MSSA   -ID following: Continue with Ancef  -Repeat blood cultures on 7/7: probable staphlococcus species  -ECHO negative for vegetation     Right Breast Skin Changes   -Chest CT w/wo contrast (7/7)- consistant with lymphadenopathy of right axilla and inflammatory/infectious skin changes of bilateral breasts which could be mastitis vs. cellulitis, malignancy cannot be ruled out, therefore mammogram outpatient is recommended   -Lymphadenopathy and thickened skin noted      ESRD (end stage renal disease)  -Reportedly does not follow-up with outpatient nephrologist or dialysis center. Patient states she takes a bus and goes to the [facility] ED for her dialysis.  -Nephrology following: continue epogen  -HD ordered here for MWF      Anemia of chronic disease  -Last Hemoglobin 7.2 (7/9) which is slightly lower than patient's baseline but increased compared to hemoglobin 6.4 reported on recent hospitalization. Patient continues to refuse labs.  -Repeat H&H if patient will allow.  -Fecal occult blood test ordered  -Transfuse if hemoglobin less than 7.0     Acute hyponatremia (Resolved)  -Initial serum sodium  125 --> 128. (7/9) 136.  -If able to make any urine, send urine sodium and osmolality.     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  Reduced to 0.5 mg IV every 4 hours as needed for severe pain     Code status: Full   Prophylaxis: SCDs  Care Plan discussed with: Patient, RN, Attending  Anticipated Disposition: TBD      Principal Problem:    Cellulitis of left upper extremity  Resolved Problems:    * No resolved hospital problems. *     Social Determinants of Health           Tobacco Use: Low Risk  (05/07/2022)     Patient History      Smoking Tobacco Use: Never      Smokeless Tobacco Use: Never      Passive Exposure: Not on file   Alcohol Use: Not At Risk (09/23/2022)     AUDIT-C      Frequency of Alcohol Consumption: Never      Average Number of Drinks: Patient does not drink      Frequency of Binge Drinking: Never   Physicist, medical Strain: Not on file   Food Insecurity: Not on file   Transportation Needs: Not on file   Physical Activity: Not on file   Stress: Not on file   Social Connections: Moderately Integrated (09/29/2022)     Social Connections [facility])      If for any reason you need help with day-to-day activities such as bathing, preparing meals, shopping, managing finances, etc., do you get the help you need?: Not on file   Intimate Partner Violence: Not on file   Depression: Not on file   Housing Stability: Not on file   Interpersonal Safety: Not At Risk (09/23/2022)     Interpersonal Safety Domain Source: IP Abuse Screening      Physical abuse: Denies      Verbal abuse: Denies      Emotional abuse: Denies      Financial abuse: Denies      Sexual abuse: Denies    Utilities: Not on file         Review of Systems:   Pertinent items are noted in HPI.         Vital Signs:    Last 24hrs VS reviewed since prior progress note. Most recent are:      Vitals:     09/29/22 1554   BP: (!) 141/76   Pulse: 76   Resp: 18   Temp: 98.4 F (36.9 C)   SpO2: 100%         No intake or output data in the 24 hours ending 09/29/22 1627      Physical Examination:      I had a face to face encounter with this patient and independently examined them on 09/29/2022 as outlined below:     General : alert x 3, awake, no acute distress  HEENT: PEERL, EOMI, moist mucus membrane  Neck: supple, no JVD  Chest: Clear to auscultation bilaterally   Right breast with peau d'orange changes noted to skin, lymphadenopathy to right axilla, right breast larger than left today  CVS: S1 S2 heard, Capillary refill less than 2 seconds  Right fistula with + bruit and + thrill   Abd: soft/ non tender, non distended  Ext:  no clubbing, no cyanosis, no edema, left upper extremity tender to touch   Neuro/Psych: pleasant mood and affect, sensory grossly within normal limit  Skin: warm      Data Review:    Review and/or order of clinical lab test  Review and/or order of tests in the radiology section of CPT  Review and/or order of tests in the medicine section of CPT        I have personally and independently reviewed all pertinent labs, diagnostic studies, imaging, and have provided independent interpretation of the same.      Labs:          Recent Labs     09/27/22  0708   WBC 3.4*   HGB 7.2*   HCT 22.5*   PLT 187          Recent Labs     09/27/22  0708   NA 136   K 4.6   CL 100   CO2 27   BUN 51*   PHOS 4.4          Recent Labs     09/27/22  0708   TIBC 183*      Notes reviewed from all clinical/nonclinical/nursing services involved in patient's clinical care. Care coordination discussions were held with appropriate clinical/nonclinical/ nursing providers based on care coordination needs.      Patients current active  Medications were reviewed, considered, added and adjusted based on the clinical condition today.       Home Medications were reconciled to the best of my ability given all available resources at the time of admission. Route is PO if not otherwise noted.     Medications Reviewed:             Current Facility-Administered Medications   Medication Dose Route Frequency    HYDROmorphone HCl PF (DILAUDID) injection 0.5 mg  0.5 mg IntraVENous Q6H PRN    epoetin alfa-epbx (RETACRIT) injection 10,000 Units  10,000 Units SubCUTAneous Once per day on Mon Wed Fri    ferrous sulfate (IRON 325) tablet 325 mg  325 mg Oral BID WC    diphenhydrAMINE (BENADRYL) capsule 50 mg  50 mg Oral Q8H PRN    ceFAZolin (ANCEF) 1,000 mg in sterile water 10 mL IV syringe  1,000 mg IntraVENous Q24H    promethazine (PHENERGAN) tablet 25 mg  25 mg Oral Q6H PRN    sodium chloride flush 0.9 % injection 5-40 mL  5-40 mL IntraVENous 2 times per day    sodium chloride flush 0.9 % injection 5-40 mL  5-40 mL IntraVENous PRN    0.9 % sodium chloride infusion   IntraVENous PRN    polyethylene glycol (GLYCOLAX) packet 17 g  17 g Oral Daily PRN    amLODIPine (NORVASC) tablet 10 mg  10 mg Oral Daily    metoprolol succinate (TOPROL XL) extended release tablet 25 mg  25 mg Oral Daily    pantoprazole (PROTONIX) tablet 40 mg  40 mg Oral Daily    hydrALAZINE (APRESOLINE) tablet 25 mg  25 mg Oral TID    calcium acetate (PHOSLO) tablet 667 mg  1 tablet Oral TID WC      ______________________________________________________________________  EXPECTED LENGTH OF STAY: Unable to retrieve estimated LOS  ACTUAL LENGTH OF STAY:          6  Malachi Pro, PA-C

## 2022-10-03 NOTE — Progress Notes (Signed)
[facility]     Renal Daily Progress Note:     Subjective: Completed dialysis early this AM. Tolerated treatment. No complaints. Surgery advises post dialysis ATB, no surgical intervention right now. She would like to be M-WF  Not SOB, no chest pain, left arm still tender, no N/V or abd pain    7/8-feels ok, no complaints, HD later today  7/9-seen on hemodialysis.  Blood pressure stable.  Dialysis was changed to this morning due to scheduling issues.  7/10-no new complaints.  No labs done this morning looks like patient refused  09/30/22 Resting in bed, refused labs earlier  7/13-feels ok, no complaints. Had HD yesterday  7/15-feels okay no complaints.  Will have dialysis today    Review of Systems  Pertinent items are noted in HPI.    Objective:     BP (!) 142/95   Pulse 82   Temp 98.1 F (36.7 C) (Oral)   Resp 16   Wt 70.6 kg (155 lb 10.3 oz)   SpO2 98%   BMI 25.12 kg/m   Temp (24hrs), Avg:98.1 F (36.7 C), Min:97.9 F (36.6 C), Max:98.2 F (36.8 C)      No intake or output data in the 24 hours ending 10/03/22 1257    Current Facility-Administered Medications   Medication Dose Route Frequency    HYDROmorphone HCl PF (DILAUDID) injection 0.5 mg  0.5 mg IntraVENous Q6H PRN    epoetin alfa-epbx (RETACRIT) injection 10,000 Units  10,000 Units SubCUTAneous Once per day on Mon Wed Fri    ferrous sulfate (IRON 325) tablet 325 mg  325 mg Oral BID WC    diphenhydrAMINE (BENADRYL) capsule 50 mg  50 mg Oral Q8H PRN    ceFAZolin (ANCEF) 1,000 mg in sterile water 10 mL IV syringe  1,000 mg IntraVENous Q24H    promethazine (PHENERGAN) tablet 25 mg  25 mg Oral Q6H PRN    sodium chloride flush 0.9 % injection 5-40 mL  5-40 mL IntraVENous 2 times per day    sodium chloride flush 0.9 % injection 5-40 mL  5-40 mL IntraVENous PRN    0.9 % sodium chloride infusion   IntraVENous PRN    polyethylene glycol (GLYCOLAX) packet 17 g  17 g Oral Daily PRN    amLODIPine (NORVASC) tablet 10 mg  10 mg Oral Daily    metoprolol  succinate (TOPROL XL) extended release tablet 25 mg  25 mg Oral Daily    pantoprazole (PROTONIX) tablet 40 mg  40 mg Oral Daily    hydrALAZINE (APRESOLINE) tablet 25 mg  25 mg Oral TID    calcium acetate (PHOSLO) tablet 667 mg  1 tablet Oral TID WC       Physical Exam: alert, appears stated age, asleep, and no distress  Head: Normocephalic, without obvious abnormality, atraumatic  Neck: no JVD   Lungs: CTA  Heart: regular rate and rhythm, no S3 or S4, and 2/6 holosystolic murmur left renal border A2 preserved  Abdomen: noit distended  Extremities:  No edema, Right upper extremity AV fistula  Neurologic: Grossly normal   HD access: RT UE AVF with bruit     Data Review:     LABS:  Recent Labs     10/02/22  0533 10/01/22  0531 09/30/22  1201 09/27/22  0708 09/25/22  0130 09/24/22  0711   NA 133* 134* 133* 136   < > 128*   K 4.9 4.2 4.2 4.6   < > 3.9   CL  94* 97 95* 100   < > 93*   CO2 30 32 30 27   < > 24   BUN 43* 26* 48* 51*   < > 67*   CREATININE 8.59* 5.80* 9.00* 11.60*   < > 13.50*   CALCIUM 8.8 9.1 8.8 8.3*   < > 8.1*   PHOS  --   --   --  4.4  --  3.4    < > = values in this interval not displayed.       Recent Labs     10/02/22  0533 10/01/22  0531 09/30/22  1201   WBC 4.0 3.5* 4.1   HGB 8.3* 8.2* 8.1*   HCT 26.1* 25.0* 25.3*   PLT 144* 140* 158       Micro: staph aureus in 2/2 bottles (09/23/22)    Radiology  US EXTREMITY LEFT NON VASC LIMITED    Result Date: 09/25/2022  Cellulitis of the left upper arm without evidence for drainable abscess. Electronically signed by Steele Sizer    CT CHEST W CONTRAST    Result Date: 09/25/2022  Diffuse inflammatory changes throughout both breasts, right greater than left; findings may represent cellulitis/mastitis, although underlying neoplasm is not excluded. Clinical follow-up is recommended to assure complete resolution. If findings persist despite treatment, diagnostic mammography would be recommended. No visualized abscess. Enlarged right axillary lymph nodes may be reactive;  clinical follow-up is recommended. Electronically signed by Eual Fines    XR CHEST PORTABLE    Result Date: 09/23/2022  No acute process on portable chest. Electronically signed by Syliva Overman     Assessment:   Renal Specific Problems  ESRD on HD  Hyponatremia- should correct post HD  Staph aureus bacteremia-echo negative for vegetation-f/u BC NGTD  Anemia renal failure-iron sat 32%  Hypertension with renal failure  Left upper extremity swelling/cellulitis.  no abscess by ultrasound        Plan:     HD  M-W-F while in house  ABx  Oral iron  Started Epogen  ID following

## 2022-10-03 NOTE — Progress Notes (Cosign Needed)
[facility]                                                                                          Hospitalist Progress Note  Clearnce Hasten, APRN - NP  Answering service: [phone] OR [phone] from in house phone        Date of Service:  10/03/2022  NAME:  Meredith Hawkins  DOB:  1985/09/30  MRN:  [id number]       Admission Summary:   As per H&P: Lexis Machia is a 37 y.o. 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  Patient noted onset of symptoms starting about 1 week prior to arrival. Symptoms notably have remained constant, severe, aching, aggravated with touch, without alleviating factors. Patient is resident of [place], [place] but she is visiting here in [place].       Per chart review: Patient was recent hospitalized at [facility] on 09/16/2022 complaining of right-sided flank, and swelling and pain of right breast, anemia (transfused 2 units of blood with hemoglobin 6.4); was seen by GI with plan for video capsule endoscopy but patient left AMA.  Over the past year, she has had multiple hospitalizations but each discharge record notes Eliquis on discharge medication list.  There have been no other further reports of GI bleed.  Patient does not complain of any hematemesis, hematochezia, or melena or other bleeding over the past week prior to arrival.   She presented to the ER on 7/5 with the aforementioned complaints.  She reportedly was last dialyzed on 09/20/2022 in the ED at outside hospital in [place].  She notes that she does not go to a dialysis center for hemodialysis due to "personal reasons".  Patient went to [facility] ED 7/5 where workup included labs showing hemoglobin 7.9, neutrophils 84%, sodium 125, BUN 62, creatinine 11.98, GFR 4, calcium 8.3, albumin 3.0, alkaline phosphatase 135.  12-lead EKG shows sinus rhythm, first-degree AV block at 72 beats minute.  ED ordered Dilaudid 1 mg IV x 2 + 0.5 mg IV x 1, Benadryl 25 mg IV x 1, Protonix 40 mg IV x 1, ceftriaxone 1000 mg IV x 1, and vancomycin 1000 mg IV x 1 dose.  ED performed limited bedside ultrasound of soft tissues left upper extremity showing diffuse cobblestoning consistent with cellulitis but no fluid collection consistent with abscess.  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  ED requested transfer to [facility].     Patient reports that she has been banned from all local area dialysis centers in [place] where she lives. She goes  to the [facility] ER 3x/week for her dialysis. She arrived in [place] on Wednesday, 7/3 Has not had dialysis since last Monday, 7/1.  Interval history / Subjective:      BC from 7/12 now clear  No safe disposition, lives in [place], no acceptance by 3 facilities, does not have regular HD chair or nephrologist. Needs to be escalated, Dr. Christie Beckers aware. Pt will not stay beyond July 26 as she has bills to pay, she also declines going to  [facility] for HD and IV abx.    Patient with ongoing pain throughout. Multiple complaints. Getting HD today.    Assessment & Plan:     Cellulitis of left upper extremity  MSSA Bacteremia   -Ultrasound of LUE obtained which shows cellulitis infection, no drainable abscess   -Area of induration to left upper extremity   -CT scan of chest w/wo contrast obtained  which confirms no abscess in the LUE  -Paired blood cultures 7/5 with reported MSSA   -ID following: Continue with Ancef  -Repeat blood cultures on 7/7: probable staphlococcus species  -ECHO negative for vegetation  -see ID note from 7/12 for IV abx plan     Right Breast Skin Changes   -Chest CT w/wo contrast (7/7)- consistant with lymphadenopathy of right axilla and inflammatory/infectious skin changes of bilateral breasts which could be mastitis vs. cellulitis, malignancy cannot be ruled out, therefore mammogram outpatient is recommended   -Lymphadenopathy and thickened skin noted      ESRD (end stage renal disease)  -Reportedly does not follow-up with outpatient nephrologist or dialysis center. Patient states she takes a bus and goes to the [facility] ED for her dialysis.  -Nephrology following: continue epogen  -HD ordered here for MWF      Anemia of chronic disease  -Last Hemoglobin 7.2 (7/9) which is slightly lower than patient's baseline but increased compared to hemoglobin 6.4 reported on recent hospitalization. Patient continues to refuse labs.  -Repeat H&H if patient will allow.  -Fecal occult blood test ordered and never sent  -Transfuse if hemoglobin less than 7.0     Acute hyponatremia (Resolved)  -Initial serum sodium 125 --> 128. (7/9) 136.  -If able to make any urine, send urine sodium and osmolality.     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  Reduced to 0.5 mg IV every 6 hours as needed for severe pain     Code status: Full   Prophylaxis: SCDs  Care Plan discussed with: Patient, RN, Attending  Anticipated Disposition: TBD  [facility], [facility] and [facility] has declined  transfer  Pt declines [facility] as an option     Principal Problem:    Cellulitis of left upper extremity  Resolved Problems:    * No resolved hospital problems. *    Social Determinants of Health     Tobacco Use: Low Risk  (05/07/2022)    Patient History     Smoking Tobacco Use: Never     Smokeless Tobacco Use: Never     Passive Exposure: Not on file   Alcohol Use: Not At Risk (09/23/2022)    AUDIT-C     Frequency  of Alcohol Consumption: Never     Average Number of Drinks: Patient does not drink     Frequency of Binge Drinking: Never   Physicist, medical Strain: Not on file   Food Insecurity: Not on file   Transportation Needs: Not on file   Physical Activity: Not on file   Stress: Not on file   Social Connections: Moderately Integrated (10/03/2022)    Social Connections [facility])     If for any reason you need help with day-to-day activities such as bathing, preparing meals, shopping, managing finances, etc., do you get the help you need?: Not on file   Intimate Partner Violence: Not on file   Depression: Not on file   Housing Stability: Not on file   Interpersonal Safety: Not At Risk (09/23/2022)    Interpersonal Safety Domain Source: IP Abuse Screening     Physical abuse: Denies     Verbal abuse: Denies     Emotional abuse: Denies     Financial abuse: Denies     Sexual abuse: Denies   Utilities: Not on file       Review of Systems:   Pertinent items are noted in HPI.       Vital Signs:    Last 24hrs VS reviewed since prior progress note. Most recent are:  Vitals:    10/03/22 1615   BP: (!) 157/81   Pulse: 76   Resp:    Temp:    SpO2:        No intake or output data in the 24 hours ending 10/03/22 1710       Physical Examination:     I had a face to face encounter with this patient and independently examined them on 10/03/2022 as outlined below:    General : alert x 3, awake, no acute distress  HEENT: EOMI, moist mucus membrane  Neck: supple, no JVD  Chest: Clear to auscultation bilaterally   Right breast: with peau  d'orange changes noted to skin, lymphadenopathy to right axilla, right breast larger than left   CVS: RRR, S1 S2 heard  Abd: soft/ non tender, non distended  Ext: no clubbing, no cyanosis, no edema, left upper extremity tender to touch   Neuro/Psych: irritable mood and affect, MAE spontaneously  Skin: warm   Vascular: Right fistula with + bruit/ thrill     Data Review:    Review and/or order of clinical lab test  Review and/or order of tests in the radiology section of CPT  Review and/or order of tests in the medicine section of CPT      I have personally and independently reviewed all pertinent labs, diagnostic studies, imaging, and have provided independent interpretation of the same.     Labs:     Recent Labs     10/01/22  0531 10/02/22  0533   WBC 3.5* 4.0   HGB 8.2* 8.3*   HCT 25.0* 26.1*   PLT 140* 144*       Recent Labs     10/01/22  0531 10/02/22  0533 10/03/22  1222   NA 134* 133* 135*   K 4.2 4.9 4.1   CL 97 94* 96*   CO2 32 30 26   BUN 26* 43* 58*   PHOS  --   --  4.4       No results for input(s): "TIBC" in the last 72 hours.    Invalid input(s): "FE", "PSAT", "FERR"    Notes reviewed from all  clinical/nonclinical/nursing services involved in patient's clinical care. Care coordination discussions were held with appropriate clinical/nonclinical/ nursing providers based on care coordination needs.     Patients current active Medications were reviewed, considered, added and adjusted based on the clinical condition today.      Home Medications were reconciled to the best of my ability given all available resources at the time of admission. Route is PO if not otherwise noted.    Medications Reviewed:     Current Facility-Administered Medications   Medication Dose Route Frequency    HYDROmorphone HCl PF (DILAUDID) injection 0.5 mg  0.5 mg IntraVENous Q6H PRN    epoetin alfa-epbx (RETACRIT) injection 10,000 Units  10,000 Units SubCUTAneous Once per day on Mon Wed Fri    ferrous sulfate (IRON 325) tablet 325 mg   325 mg Oral BID WC    diphenhydrAMINE (BENADRYL) capsule 50 mg  50 mg Oral Q8H PRN    ceFAZolin (ANCEF) 1,000 mg in sterile water 10 mL IV syringe  1,000 mg IntraVENous Q24H    promethazine (PHENERGAN) tablet 25 mg  25 mg Oral Q6H PRN    sodium chloride flush 0.9 % injection 5-40 mL  5-40 mL IntraVENous 2 times per day    sodium chloride flush 0.9 % injection 5-40 mL  5-40 mL IntraVENous PRN    0.9 % sodium chloride infusion   IntraVENous PRN    polyethylene glycol (GLYCOLAX) packet 17 g  17 g Oral Daily PRN    amLODIPine (NORVASC) tablet 10 mg  10 mg Oral Daily    metoprolol succinate (TOPROL XL) extended release tablet 25 mg  25 mg Oral Daily    pantoprazole (PROTONIX) tablet 40 mg  40 mg Oral Daily    hydrALAZINE (APRESOLINE) tablet 25 mg  25 mg Oral TID    calcium acetate (PHOSLO) tablet 667 mg  1 tablet Oral TID WC     ______________________________________________________________________  EXPECTED LENGTH OF STAY: Unable to retrieve estimated LOS  ACTUAL LENGTH OF STAY:          10                 Clearnce Hasten, APRN - NP

## 2022-10-03 NOTE — Other (Signed)
10/03/22 1245   Observations & Evaluations   Level of Consciousness 1   Oriented X 4   Heart Rhythm Regular   Respiratory Quality/Effort Unlabored   O2 Device None (Room air)   Bilateral Breath Sounds Diminished   Skin Condition/Temp Dry;Warm   Abdomen Inspection Soft   Edema Left upper extremity;Right lower extremity;Left lower extremity   LUE Edema +1   RLE Edema Trace   LLE Edema Trace   Vital Signs   BP 134/72   Temp 97.9 F (36.6 C)   Pulse 81   Respirations 16   SpO2 98 %   Technical Checks   Dialysis Machine No. 04   RO Machine Number [id number]   Dialyzer Lot No. [id number]   Tubing Lot Number [id number]   All Connections Secure Yes   NS Bag Yes   Saline Line Double Clamped Yes   Dialyzer Revaclear 300   Prime Volume (mL) 200 mL   ICEBOAT I;C;E;B;O;A;T  (chronic esrd)   RO Machine Log Sheet Completed Yes   Machine Alarm Self Test Completed;Corporate investment banker Conductivity 13.5   Manual Ph 7.4   Bleach Test (Neg) Yes   Bath Temperature 96.8 F (36 C)   Hemodialysis Fistula/Graft Arteriovenous fistula Right Arm   Placement Date/Time: 05/07/22 0820   Present on Admission/Arrival: Yes  Access Type: Arteriovenous fistula  Orientation: Right  Access Location: Arm   Site Assessment Clean, dry & intact   Thrill Present   Bruit Present   Status Accessed   Venous Needle Size 15 G   Arterial Needle Size 15 G   Accessed By: [place]   Access Attempts  1   Date of Last Dressing Change 10/03/22   Access Interventions Chlorhexidine;Aseptic Technique;Needles taped to patient   Dressing Intervention New   Dressing Status Clean, dry & intact   Dialysis Bath   K+ (Potassium) 2   Ca+ (Calcium) 2.5   Na+ (Sodium) 138   HCO3 (Bicarb) 38   Bicarbonate Concentrate Lot No. [id number]   Acid Concentrate Lot No. [id number]     Primary RN SBAR: L. Malen Gauze, RN   Patient Education provided: access care remove dressing tomorrow am  Preferred Education method and  Primary language: verbal/english  Hospital associated wait time; reason: 0  Hepatitis B Surface Ag   Date/Time Value Ref Range Status   05/07/2022 09:35 PM <0.10 Index Final     Hep B S Ag Interp   Date/Time Value Ref Range Status   05/07/2022 09:35 PM Negative NEG   Final     Hep B S Ab   Date/Time Value Ref Range Status   05/07/2022 09:35 PM 277.97 mIU/mL Final     Hep B S Ab Interp   Date/Time Value Ref Range Status   05/07/2022 09:35 PM REACTIVE (A) NR   Final        10/03/22 1300   Vital Signs   BP 132/76   Pulse 76   Treatment Initiation   Dialyze Hours 3.5   Treatment  Initiation Universal Precautions maintained;Lines secured to patient;Prime given;Venous Parameters set;Arterial Parameters set;IT consultant engaged;[place] Device;Dialysate;Dialyzer   During Hemodialysis Assessment   Blood Flow Rate (ml/min) 400 ml/min   Arterial Pressure (mmHg) -160 mmHg   Venous Pressure (mmHg) 150   TMP 80   DFR 600   Comments tx initiated   Access Visible Yes   Ultrafiltration Rate (ml/hr)  1000 ml/hr          10/03/22 1630   Treatment   Time Off 1630   Observations & Evaluations   Level of Consciousness 0   Oriented X 4   Heart Rhythm Regular   Respiratory Quality/Effort Unlabored   O2 Device None (Room air)   Bilateral Breath Sounds Diminished   Skin Condition/Temp Dry;Warm   Abdomen Inspection Soft   Edema Left upper extremity;Right lower extremity;Left lower extremity   LUE Edema +1   RLE Edema Trace   LLE Edema Trace   Vital Signs   BP (!) 157/83   Temp 97.7 F (36.5 C)   Pulse 76   Respirations 16   SpO2 100 %   During Hemodialysis Assessment   Ultrafiltration Removed (ml) 3400 ml   Hemodialysis Fistula/Graft Arteriovenous fistula Right Arm   Placement Date/Time: 05/07/22 0820   Present on Admission/Arrival: Yes  Access Type: Arteriovenous fistula  Orientation: Right  Access Location: Arm   Site Assessment Clean, dry & intact   Thrill Present   Bruit Present   Status Deaccessed   Venous Needle Size 15 G    Arterial Needle Size 15 G   Date of Last Dressing Change 10/03/22   Dressing Intervention New   Dressing Status Clean, dry & intact   Post-Hemodialysis Assessment   Post-Treatment Procedures Blood returned;Access bleeding time < 10 minutes   Art therapist   Rinseback Volume (ml) 200 ml   Blood Volume Processed (Liters) 76 L   Dialyzer Clearance Lightly streaked   Duration of Treatment (minutes) 210 minutes   Heparin Amount Administered During Treatment (mL) 0 mL   Hemodialysis Intake (ml) 400 ml   Hemodialysis Output (ml) 3400 ml   NET Removed (ml) 3000   Tolerated Treatment Good   Patient Response to Treatment uf and time goals achieved   Patient Disposition Other (Comment)  (remains in room)     Primary RN SBAR: Azzie Almas, RN  Comments: treatment completed, all possible blood returned, hemostasis achieved, sbar with primary nurse, bed in low position side rails up x 2, call bell within reach on departure.

## 2022-10-03 NOTE — Care Coordination-Inpatient (Signed)
CM Team Lead note:    Case escalated by unit CM due to discharge barriers on Friday 7/12 as new IV abx orders written with plan to be administered outpatient until 10/25/2022 after dialysis.    Barriers:   Pt is from [place] and has no outpatient HD unit due to history of non-adherence to the treatment plan.  This barrier has existed for several months prior to this admission at [facility].  Pt has Community education officer and a medicaid product ([place] medicaid) secondary and would require authorization to admit to a local SNF for HD and IV abx and continued care until completion of IV abx.  Pt has NOT agreed to CM exploring the above plan.  Pt has identified that she will leave the hospital by 10/14/2022.  Pt has a history of leaving AMA as noted by documentation in previous encounters with hospitals in [place].      Hannah Beat, LCSW [facility]  Care Manager Team Lead  [phone]

## 2022-10-03 NOTE — Progress Notes (Signed)
NUTRITION  Reason for Assessment: LOS      Recommendations/Interventions/Plan:   Continue current PO diet        Will rescreen per policy       Past Medical History:   Diagnosis Date    Asthma     Lupus (HCC)     VSD (ventricular septal defect)          Pt screened for LOS.  Chart/labs/meds reviewed.  Admitted with Unknown cause of injury, initial encounter [place]atius.[id number]]  Unknown cause of injury [X58.XXXA]  Cellulitis of left upper extremity [L03.114]    Chart reviewed, noted pt has no home HD chair. Per H&P notes, pt goes to [facility] ED for HD 3x/week. No HD x5d prior to admit-- this is likely why pt has ~15# wt loss since admit, fluid losses. No intakes to assess in I/O, RD attempted interview however it was limited as pt focused on game on phone. Pt denies nutrition issues pta, stated she consumes generally >50% of meals and that "the food tastes fine" but her intakes "depends on how I feel". No nutrition interventions needed at this time.       Nutrition Related Findings:   Edema: Left upper extremity, Right lower extremity, Left lower extremity         LUE Edema: +1  RLE Edema: Trace  LLE Edema: Trace      Last BM: 10/02/22      Skin: intact      Current Nutrition Therapies:  Diet: regular texture, heart-healthy, low potassium diet  Supplements: na  Meal Intake:   No data found.  Supplement Intake:  No data found.      Weight Hx:  Wt Readings from Last 10 Encounters:   10/01/22 70.6 kg (155 lb 10.3 oz)   09/23/22 78 kg (172 lb)   05/18/22 77 kg (169 lb 12.1 oz)   05/07/22 77 kg (169 lb 12.1 oz)         Recent Labs     10/01/22  0531 10/02/22  0533 10/03/22  1222   GLUCOSE 86 81 105*   BUN 26* 43* 58*   CREATININE 5.80* 8.59* 11.10*   NA 134* 133* 135*   K 4.2 4.9 4.1   CL 97 94* 96*   CO2 32 30 26   CALCIUM 9.1 8.8 8.1*   PHOS  --   --  4.4         Labrittany Wechter, RD  Available via [employer]

## 2022-10-04 MED ORDER — STERILE WATER FOR INJECTION (MIXTURES ONLY)
1 g | Freq: Once | INTRAMUSCULAR | Status: AC
Start: 2022-10-04 — End: 2022-10-04
  Administered 2022-10-04: 06:00:00 1000 mg via INTRAMUSCULAR

## 2022-10-04 MED ORDER — LIDOCAINE HCL 1% INJ (MIXTURES ONLY)
1 % | Freq: Once | INTRAMUSCULAR | Status: DC
Start: 2022-10-04 — End: 2022-10-04

## 2022-10-04 MED ORDER — HYDROMORPHONE HCL 2 MG PO TABS
2 | Freq: Four times a day (QID) | ORAL | Status: DC | PRN
Start: 2022-10-04 — End: 2022-10-04

## 2022-10-04 MED FILL — CALCIUM ACETATE (PHOS BINDER) 667 MG PO TABS: 667 MG | ORAL | Qty: 1

## 2022-10-04 MED FILL — CEFAZOLIN SODIUM 1 G IJ SOLR: 1 g | INTRAMUSCULAR | Qty: 1000

## 2022-10-04 MED FILL — HYDROMORPHONE HCL 1 MG/ML IJ SOLN: 1 MG/ML | INTRAMUSCULAR | Qty: 1

## 2022-10-04 MED FILL — HYDRALAZINE HCL 25 MG PO TABS: 25 MG | ORAL | Qty: 1

## 2022-10-04 MED FILL — FEROSUL 325 (65 FE) MG PO TABS: 325 (65 Fe) MG | ORAL | Qty: 1

## 2022-10-04 MED FILL — AMLODIPINE BESYLATE 5 MG PO TABS: 5 MG | ORAL | Qty: 2

## 2022-10-04 MED FILL — PANTOPRAZOLE SODIUM 40 MG PO TBEC: 40 MG | ORAL | Qty: 1

## 2022-10-04 MED FILL — METOPROLOL SUCCINATE ER 25 MG PO TB24: 25 MG | ORAL | Qty: 1

## 2022-10-04 NOTE — Discharge Summary (Cosign Needed)
Discharge Summary     PATIENT LEFT HOSPITAL AGAINST MEDICAL ADVICE     PATIENT ID: Meredith Hawkins  MRN: [id number]   DATE OF BIRTH: [birth year]    DATE OF ADMISSION: 09/23/2022  9:39 PM    DATE OF DISCHARGE: 10/04/2022   PRIMARY CARE PROVIDER: No primary care provider on file.     ATTENDING PHYSICIAN: Dr. Christie Beckers  DISCHARGING PROVIDER: Mike Gip, APRN - NP    To contact this individual call [phone] and ask the operator to page.  If unavailable ask to be transferred the Adult Hospitalist Department.    CONSULTATIONS: IP CONSULT TO NEPHROLOGY  IP CONSULT TO PHARMACY  IP CONSULT TO PHARMACY  IP WOUND CARE NURSE CONSULT TO EVAL  IP CONSULT TO INFECTIOUS DISEASES    PROCEDURES/SURGERIES: * No surgery found *     ADMITTING DIAGNOSES & HOSPITAL COURSE:   As per H&P: Meredith Hawkins is a 37 y.o. 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  Patient noted onset of symptoms starting about 1 week prior to arrival. Symptoms notably have remained constant, severe, aching, aggravated with touch, without alleviating factors. Patient is resident of [place], [place] but she is visiting here in [place].       Per chart review: Patient was recent hospitalized at [facility] on 09/16/2022 complaining of right-sided flank, and swelling and pain of right breast, anemia (transfused 2 units of blood with hemoglobin  6.4); was seen by GI with plan for video capsule endoscopy but patient left AMA.  Over the past year, she has had multiple hospitalizations but each discharge record notes Eliquis on discharge medication list.  There have been no other further reports of GI bleed.  Patient does not complain of any hematemesis, hematochezia, or melena or other bleeding over the past week prior to arrival.  She presented to the ER on 7/5 with the aforementioned complaints.  She reportedly was last dialyzed on 09/20/2022 in the ED at outside hospital in [place].  She notes that she does not go to a dialysis center for hemodialysis due to "personal reasons".  Patient went to [facility] ED 7/5 where workup included labs showing hemoglobin 7.9, neutrophils 84%, sodium 125, BUN 62, creatinine 11.98, GFR 4, calcium 8.3, albumin 3.0, alkaline phosphatase 135.  12-lead EKG shows sinus rhythm, first-degree AV block at 72 beats minute.  ED ordered Dilaudid 1 mg IV x 2 + 0.5 mg IV x 1, Benadryl 25 mg IV x 1, Protonix 40 mg IV x 1, ceftriaxone 1000 mg IV x 1, and vancomycin 1000 mg IV x 1 dose.  ED performed limited bedside ultrasound of soft tissues left upper extremity showing diffuse cobblestoning consistent with cellulitis but no fluid collection consistent with abscess.  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  ED requested transfer to [facility].     Patient reports that she has been banned from all local area dialysis centers in [place] where she lives. She goes  to the [facility] ER 3x/week for her dialysis. She arrived in [place] on Wednesday, 7/3 Has not had dialysis since last Monday, 7/1.    On 7/16, patient was seen and examined mid-day. Discussed plan for ongoing IV antibiotics through 10/25/22 per ID recommendations. Patient initially stating that she plans to stay at hospital for duration of antibiotics. Then discussed plan for transition to oral pain  medications today as patient tolerating diet without issues. Patient then stating that she likely could only stay in hospital through next week due to obligations at home [facility]). Notified by nurse around 1300 that patient self-removed telemetry and IV - she decided to leave hospital AGAINST MEDICAL ADVICE. She refused to sign AMA paperwork prior to leaving.     DISCHARGE DIAGNOSES / PLAN:      Cellulitis of left upper extremity  MSSA Bacteremia   -Ultrasound of LUE obtained which shows cellulitis infection, no drainable abscess   -Area of induration to left upper extremity   -CT scan of chest w/wo contrast obtained which confirms no abscess in the LUE  -Paired blood cultures 7/5 with reported MSSA   -ID following: Continue with Ancef  -Repeat blood cultures on 7/10: probable staphlococcus species  -Repeat blood cultures on 7/12 with NGTD  -ECHO negative for vegetation  -see ID note from 7/12 for IV abx plan -- plan for ongoing antibiotics through 8/6     Right Breast Skin Changes   -Chest CT w/wo contrast (7/7)- consistant with lymphadenopathy of right axilla and inflammatory/infectious skin changes of bilateral breasts which could be mastitis vs. cellulitis, malignancy cannot be ruled out, therefore mammogram outpatient is recommended   -Lymphadenopathy and thickened skin noted      ESRD (end stage renal disease)  -Reportedly does not follow-up with outpatient nephrologist or dialysis center. Patient states she takes a bus and goes to the [facility] ED for her dialysis.  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  Reduced to 0.5 mg IV every 6 hours as needed for severe pain -- transitioned to 1 mg PO Dilaudid every 6 hours on 7/16  -7/16: tolerating diet, having regular BMs       PENDING TEST RESULTS:   At the time of discharge the following test results are still pending: none    ADDITIONAL CARE RECOMMENDATIONS:   Patient left hospital against medical advice. Plans for return to emergency department for worsening symptoms.     DISCHARGE MEDICATIONS:     Medication List        ASK your doctor about these medications      amLODIPine 10 MG tablet  Commonly known as: NORVASC     Eliquis 2.5 MG Tabs tablet  Generic drug: apixaban  hydrALAZINE 25 MG tablet  Commonly known as: APRESOLINE     metoprolol succinate 25 MG extended release tablet  Commonly known as: TOPROL XL     pantoprazole 40 MG tablet  Commonly known as: PROTONIX     PhosLo 667 MG Caps capsule  Generic drug: calcium acetate                NOTIFY YOUR PHYSICIAN FOR ANY OF THE FOLLOWING:   Fever over 101 degrees for 24 hours.   Chest pain, shortness of breath, fever, chills, nausea, vomiting, diarrhea, change in mentation, falling, weakness, bleeding. Severe pain or pain not relieved by medications.  Or, any other signs or symptoms that you may have questions about.    DISPOSITION:   X Home With: none   OT  PT  HH  RN       Long term SNF/Inpatient Rehab    Independent/assisted living    Hospice    Other:       PATIENT CONDITION AT DISCHARGE:     Functional status    Poor     Deconditioned    X Independent      Cognition    X Lucid     Forgetful     Dementia      Catheters/lines (plus indication)    Foley     PICC     PEG    X None      Code status    X Full code     DNR      PHYSICAL  EXAMINATION AT DISCHARGE:  General : alert, oriented X 4, awake, no acute distress  HEENT: EOMI, moist mucus membranes  Neck: supple, no JVD  Chest: Clear to auscultation bilaterally, on room air  Right breast: with peau d'orange changes noted to skin, lymphadenopathy to right axilla, right breast larger than left   CVS: RRR, S1 S2 heard  Abd: soft/non tender, non distended, active bowel sounds  Ext: no clubbing, no cyanosis, no edema, left upper extremity tender to touch   Neuro/Psych: irritable mood and affect, MAE spontaneously, follows commands  Skin: warm, dry  Vascular: Right fistula with + bruit/ thrill    CHRONIC MEDICAL DIAGNOSES:  Principal Problem:    Cellulitis of left upper extremity  Resolved Problems:    * No resolved hospital problems. *        Greater than 31 minutes were spent with the patient on counseling and coordination of care    Signed:   Mike Gip, APRN - NP  10/04/2022  3:52 PM

## 2022-10-04 NOTE — Progress Notes (Addendum)
1930-Received shift report from [facility]  PT has no IV access at start of shift.  Pt refusing vitals    2030-Attempted to obtain access without success.      2100-RN called RRT and other resources no one able to try at this time will follow up later.     2300-Attempted to obtain VS pt refused    0130-RN called RRT and other resources around hospital.  No one able to try at this time.  Will follow up.     0200-Jane NP and RRT spoke regarding difficulty with this pt and obtaining IV access.  NP placed orders for one time dose of IM Rocephin.

## 2022-10-04 NOTE — Progress Notes (Signed)
Pt removed telemetry and IV herself and left the hospital. Pt refused to sign the [facility] paperwork before leaving.

## 2022-10-04 NOTE — Progress Notes (Signed)
Narrative: I made a follow up visit to Meredith Hawkins, a patient in 5E. I reviewed her medical records before this visit. Another chaplain visited her before and declined the visit. She also declined my visit. I informed her of the availability of chaplain's support if ever she feels the need for it.    Chaplain Cheryle Dark  Page a Orthoptist [phone]

## 2022-10-05 LAB — CULTURE, BLOOD 1: Culture: NO GROWTH

## 2022-10-05 LAB — CULTURE, BLOOD 2: Culture: NO GROWTH

## 2022-11-07 ENCOUNTER — Encounter (HOSPITAL_COMMUNITY): Payer: Self-pay

## 2022-11-07 ENCOUNTER — Other Ambulatory Visit: Payer: Self-pay

## 2022-11-07 ENCOUNTER — Emergency Department (HOSPITAL_COMMUNITY)
Admission: EM | Admit: 2022-11-07 | Discharge: 2022-11-08 | Disposition: A | Discharge status: Home / Self Care | Payer: 59 | Attending: Emergency Medicine | Admitting: Emergency Medicine

## 2022-11-07 DIAGNOSIS — I120 Hypertensive chronic kidney disease with stage 5 chronic kidney disease or end stage renal disease: Secondary | ICD-10-CM | POA: Diagnosis not present

## 2022-11-07 DIAGNOSIS — E875 Hyperkalemia: Secondary | ICD-10-CM | POA: Diagnosis not present

## 2022-11-07 DIAGNOSIS — Z79899 Other long term (current) drug therapy: Secondary | ICD-10-CM | POA: Diagnosis not present

## 2022-11-07 DIAGNOSIS — N186 End stage renal disease: Secondary | ICD-10-CM | POA: Insufficient documentation

## 2022-11-07 DIAGNOSIS — I871 Compression of vein: Secondary | ICD-10-CM

## 2022-11-07 DIAGNOSIS — Z992 Dependence on renal dialysis: Secondary | ICD-10-CM | POA: Insufficient documentation

## 2022-11-07 DIAGNOSIS — Z7901 Long term (current) use of anticoagulants: Secondary | ICD-10-CM | POA: Diagnosis not present

## 2022-11-07 DIAGNOSIS — E8779 Other fluid overload: Secondary | ICD-10-CM | POA: Insufficient documentation

## 2022-11-07 DIAGNOSIS — R109 Unspecified abdominal pain: Secondary | ICD-10-CM | POA: Diagnosis present

## 2022-11-07 DIAGNOSIS — R1084 Generalized abdominal pain: Secondary | ICD-10-CM

## 2022-11-07 HISTORY — DX: Compression of vein: I87.1

## 2022-11-07 LAB — CBC WITH DIFFERENTIAL/PLATELET
Abs Immature Granulocytes: 0.01 10*3/uL (ref 0.00–0.07)
Basophils Absolute: 0 10*3/uL (ref 0.0–0.1)
Basophils Relative: 1 %
Eosinophils Absolute: 0.3 10*3/uL (ref 0.0–0.5)
Eosinophils Relative: 8 %
HCT: 25 % — ABNORMAL LOW (ref 36.0–46.0)
Hemoglobin: 8.2 g/dL — ABNORMAL LOW (ref 12.0–15.0)
Immature Granulocytes: 0 %
Lymphocytes Relative: 23 %
Lymphs Abs: 0.8 10*3/uL (ref 0.7–4.0)
MCH: 30.5 pg (ref 26.0–34.0)
MCHC: 32.8 g/dL (ref 30.0–36.0)
MCV: 92.9 fL (ref 80.0–100.0)
Monocytes Absolute: 0.5 10*3/uL (ref 0.1–1.0)
Monocytes Relative: 15 %
Neutro Abs: 1.7 10*3/uL (ref 1.7–7.7)
Neutrophils Relative %: 53 %
Platelets: 123 10*3/uL — ABNORMAL LOW (ref 150–400)
RBC: 2.69 MIL/uL — ABNORMAL LOW (ref 3.87–5.11)
RDW: 14.8 % (ref 11.5–15.5)
WBC: 3.3 10*3/uL — ABNORMAL LOW (ref 4.0–10.5)
nRBC: 0 % (ref 0.0–0.2)

## 2022-11-07 LAB — COMPREHENSIVE METABOLIC PANEL
ALT: 11 U/L (ref 0–44)
AST: 23 U/L (ref 15–41)
Albumin: 3.6 g/dL (ref 3.5–5.0)
Alkaline Phosphatase: 176 U/L — ABNORMAL HIGH (ref 38–126)
Anion gap: 20 — ABNORMAL HIGH (ref 5–15)
BUN: 77 mg/dL — ABNORMAL HIGH (ref 6–20)
CO2: 20 mmol/L — ABNORMAL LOW (ref 22–32)
Calcium: 6.1 mg/dL — CL (ref 8.9–10.3)
Chloride: 96 mmol/L — ABNORMAL LOW (ref 98–111)
Creatinine, Ser: 16.29 mg/dL — ABNORMAL HIGH (ref 0.44–1.00)
GFR, Estimated: 3 mL/min — ABNORMAL LOW (ref >60)
Glucose, Bld: 92 mg/dL (ref 70–99)
Potassium: 5.2 mmol/L — ABNORMAL HIGH (ref 3.5–5.1)
Sodium: 136 mmol/L (ref 135–145)
Total Bilirubin: 0.6 mg/dL (ref 0.3–1.2)
Total Protein: 7 g/dL (ref 6.5–8.1)

## 2022-11-07 NOTE — ED Provider Triage Note (Signed)
Emergency Medicine Provider Triage Evaluation Note  Evelyn Bryant , a 37 y.o. female  was evaluated in triage.  Pt complains of right breast pain and swelling, left flank pain for the past 3 days.  She states she has history of superior vena cava syndrome with a stent placed.  She states that sometimes when the stent is out of place she gets these symptoms.  She also has history of spontaneous bleeding in her left flank for which they placed a coil.  Denies any chance of pregnancy, states she is not sexually active.  Denies any shortness of breath or chest pain.  Review of Systems  Positive: As above Negative: As above  Physical Exam  BP (!) 148/91 (BP Location: Left Arm)   Pulse 79   Temp 98.3 F (36.8 C) (Oral)   Resp 18   SpO2 100%  Gen:   Awake, no distress   Resp:  Normal effort  MSK:   Moves extremities without difficulty  Other:  Distention of neck veins Right breast with edema and no overlying skin changes No tenderness to palpation of the abdomen Cardiac auscultation with regular rate and rhythm, murmur present Lungs clear to auscultation bilaterally  Medical Decision Making  Medically screening exam initiated at 9:46 PM.  Appropriate orders placed.  Yulitza Dorado was informed that the remainder of the evaluation will be completed by another provider, this initial triage assessment does not replace that evaluation, and the importance of remaining in the ED until their evaluation is complete.     Arabella Merles, PA-C 11/07/22 2202

## 2022-11-07 NOTE — ED Notes (Signed)
Attempted to get blood, patient said she was a hard stick and it could not be obtained she was not very cooperative and rude. What little blood I did obtain clotted. Ordered was placed for IV team.

## 2022-11-07 NOTE — ED Triage Notes (Signed)
Pt arrived from home via POV c/o bilateral flank pain 9/10, neck swelling and pain 9/10 new appearance of varicosities on upper chest/ lower neck and right sided breast pain 9/10 with significant swelling right breast noted to be much larger than left breast.

## 2022-11-08 ENCOUNTER — Emergency Department (HOSPITAL_COMMUNITY): Payer: 59

## 2022-11-08 LAB — HCG, SERUM, QUALITATIVE: Preg, Serum: NEGATIVE

## 2022-11-08 LAB — I-STAT CREATININE, ED: Creatinine, Ser: >18 mg/dL — ABNORMAL HIGH (ref 0.44–1.00)

## 2022-11-08 MED ORDER — APIXABAN 2.5 MG PO TABS
2.5000 mg | ORAL_TABLET | Freq: Two times a day (BID) | ORAL | Status: DC
  Administered 2022-11-08 (×2): 2.5 mg via ORAL
  Filled 2022-11-08 (×2): qty 1

## 2022-11-08 MED ORDER — HYDROMORPHONE HCL 1 MG/ML IJ SOLN
1.0000 mg | Freq: Once | INTRAMUSCULAR | Status: AC
  Administered 2022-11-08: 1 mg via INTRAVENOUS
  Filled 2022-11-08: qty 1

## 2022-11-08 MED ORDER — IOHEXOL 350 MG/ML SOLN
75.0000 mL | Freq: Once | INTRAVENOUS | Status: AC | PRN
  Administered 2022-11-08: 75 mL via INTRAVENOUS

## 2022-11-08 MED ORDER — ONDANSETRON HCL 4 MG/2ML IJ SOLN
4.0000 mg | Freq: Once | INTRAMUSCULAR | Status: AC
  Administered 2022-11-08: 4 mg via INTRAVENOUS
  Filled 2022-11-08: qty 2

## 2022-11-08 MED ORDER — PROMETHAZINE HCL 25 MG PO TABS
25.0000 mg | ORAL_TABLET | Freq: Once | ORAL | Status: AC
  Administered 2022-11-08: 25 mg via ORAL
  Filled 2022-11-08: qty 1

## 2022-11-08 MED ORDER — DIPHENHYDRAMINE HCL 50 MG/ML IJ SOLN
12.5000 mg | Freq: Once | INTRAMUSCULAR | Status: AC
  Administered 2022-11-08: 12.5 mg via INTRAVENOUS
  Filled 2022-11-08: qty 1

## 2022-11-08 MED ORDER — ONDANSETRON HCL 4 MG/2ML IJ SOLN
INTRAMUSCULAR | Status: AC
  Filled 2022-11-08: qty 2

## 2022-11-08 MED ORDER — ONDANSETRON HCL 4 MG PO TABS
4.0000 mg | ORAL_TABLET | Freq: Once | ORAL | Status: AC
  Administered 2022-11-08: 4 mg via ORAL
  Filled 2022-11-08 (×2): qty 1

## 2022-11-08 MED ORDER — PROMETHAZINE (PHENERGAN) 6.25MG IN NS 50ML IVPB
6.2500 mg | Freq: Once | INTRAVENOUS | Status: DC
  Filled 2022-11-08: qty 50

## 2022-11-08 NOTE — Progress Notes (Signed)
Paged by dialysis RN that she is requesting medications for itching, pain, nausea.  Will not order narcotic pain medications for her. Tylenol can be given by HD unit if she desires.  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  Will go ahead and order Zofran and benadryl for her today, however her medical chart is very suspicious for traveling around to multiple hospitals.  In chart review - hard to decipher why she does not have a permanent HD unit - presuming she was involuntarily discharged. Will ask renal navigator to sort this if she is admitted.  Ozzie Hoyle, PA-C [employer] Pager [phone]

## 2022-11-08 NOTE — ED Provider Notes (Signed)
[facility] EMERGENCY DEPARTMENT AT [facility] Provider Note   CSN: [id number] Arrival date & time: 11/07/22  2051     History  Chief Complaint  Patient presents with   Flank Pain    Evelyn Bryant is a 37 y.o. female.  The history is provided by the patient and medical records.  Flank Pain  Evelyn Bryant is a 37 y.o. female who presents to the Emergency Department complaining of swelling.  She presents to the emergency department for evaluation of swelling in her right neck, breast that she feels has been present since Saturday.  She also complains of left flank pain since Saturday.  She does report associated nausea and vomiting.  She has ESRD secondary to lupus nephritis and her last dialysis session was on Tuesday.  She does not currently have a dialysis center and has been getting dialysis at [facility].  She states that she is not scheduled for certain times to have dialysis and is told to present when she has symptoms.  She does have mild shortness of breath.  No fever, diarrhea.  She states that her dry weight is 77.5 kg.  She has had prior similar symptoms of swelling is secondary to missed dialysis.  She does have a history of SVC syndrome and is on Eliquis.  She also reports a history of renal hemorrhage.  Additional history of hypertension.  She is compliant with her home medications.       Home Medications Prior to Admission medications   Medication Sig Start Date End Date Taking? Authorizing Provider  amLODipine (NORVASC) 10 MG tablet Take 10 mg by mouth daily. 04/12/17  Yes [provider]  apixaban (ELIQUIS) 2.5 MG TABS tablet Take 1 tablet by mouth 2 (two) times daily. 07/11/22  Yes [provider]  calcium acetate (PHOSLO) 667 MG capsule Take 667 mg by mouth 3 (three) times daily with meals.   Yes [provider]  furosemide (LASIX) 20 MG tablet Take 20 mg by mouth daily. For 30 days 10/20/22 10/20/23 Yes [provider]   hydrALAZINE (APRESOLINE) 25 MG tablet Take 25 mg by mouth in the morning and at bedtime. 08/03/21 05/27/23 Yes [provider]  pantoprazole (PROTONIX) 40 MG tablet Take 40 mg by mouth 2 (two) times daily. 03/26/22  Yes [provider]      Allergies    Ace inhibitors, Acetaminophen, Codeine, Enalapril, Fentanyl, Hydrocodone, Oxycodone, Sulfa antibiotics, and Nsaids    Review of Systems   Review of Systems  Genitourinary:  Positive for flank pain.  All other systems reviewed and are negative.   Physical Exam Updated Vital Signs BP 121/80 (BP Location: Left Arm)   Pulse 71   Temp 97.7 F (36.5 C) (Oral)   Resp 20   Ht 5\' 6"  (1.676 m)   Wt 83.2 kg   LMP  (LMP Unknown)   SpO2 98%   BMI 29.60 kg/m  Physical Exam Vitals and nursing note reviewed.  Constitutional:      Appearance: She is well-developed.  HENT:     Head: Normocephalic and atraumatic.  Neck:     Comments: Mild soft tissue swelling. Cardiovascular:     Rate and Rhythm: Normal rate and regular rhythm.     Heart sounds: Murmur heard.  Pulmonary:     Effort: Pulmonary effort is normal. No respiratory distress.     Breath sounds: Normal breath sounds.     Comments: Varicosities of the chest wall that appear chronic in  nature Abdominal:     Palpations: Abdomen is soft.     Tenderness: There is no abdominal tenderness. There is no guarding or rebound.  Musculoskeletal:        General: No tenderness.     Comments: No lower extremity edema.  There is a fistula in the right upper extremity with palpable thrill.  Skin:    General: Skin is warm and dry.  Neurological:     Mental Status: She is alert and oriented to person, place, and time.  Psychiatric:        Behavior: Behavior normal.     ED Results / Procedures / Treatments   Labs (all labs ordered are listed, but only abnormal results are displayed) Labs Reviewed  CBC WITH DIFFERENTIAL/PLATELET - Abnormal; Notable for the following  components:      Result Value   WBC 3.3 (*)    RBC 2.69 (*)    Hemoglobin 8.2 (*)    HCT 25.0 (*)    Platelets 123 (*)    All other components within normal limits  COMPREHENSIVE METABOLIC PANEL - Abnormal; Notable for the following components:   Potassium 5.2 (*)    Chloride 96 (*)    CO2 20 (*)    BUN 77 (*)    Creatinine, Ser 16.29 (*)    Calcium 6.1 (*)    Alkaline Phosphatase 176 (*)    GFR, Estimated 3 (*)    Anion gap 20 (*)    All other components within normal limits  I-STAT CREATININE, ED - Abnormal; Notable for the following components:   Creatinine, Ser >18.00 (*)    All other components within normal limits  HCG, SERUM, QUALITATIVE  URINALYSIS, ROUTINE W REFLEX MICROSCOPIC  POC URINE PREG, ED    EKG EKG Interpretation Date/Time:  Tuesday November 08 2022 01:34:37 EDT Ventricular Rate:  66 PR Interval:  239 QRS Duration:  108 QT Interval:  485 QTC Calculation: 509 R Axis:   64  Text Interpretation: Sinus rhythm Prolonged PR interval Consider left atrial enlargement Prolonged QT interval Confirmed by Tilden Fossa [phone]) on 11/08/2022 1:56:07 AM  Radiology DG Chest 2 View  Result Date: 11/08/2022 CLINICAL DATA:  Pulmonary edema, dyspnea EXAM: CHEST - 2 VIEW COMPARISON:  07/01/2022 FINDINGS: Lungs are clear. No pneumothorax or pleural effusion. Cardiac size is within normal limits. Right subclavian and superior vena caval venous stenting has been performed. Pulmonary vascularity is normal. No acute bone abnormality. Metallic densities within the epigastric region are indeterminate and may represent objects overlying the patient. Surgical clips are seen within the right axilla. IMPRESSION: 1. No active cardiopulmonary disease. Electronically Signed   By: Helyn Numbers M.D.   On: 11/08/2022 01:34    Procedures Procedures    Medications Ordered in ED Medications  apixaban (ELIQUIS) tablet 2.5 mg (2.5 mg Oral Given 11/08/22 0102)  ondansetron (ZOFRAN)  injection 4 mg (4 mg Intravenous Given 11/08/22 0054)  HYDROmorphone (DILAUDID) injection 1 mg (1 mg Intravenous Given 11/08/22 0055)    ED Course/ Medical Decision Making/ A&P                                 Medical Decision Making Amount and/or Complexity of Data Reviewed Labs: ordered. Radiology: ordered.  Risk Prescription drug management.   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  Per patient her dry weight is 77.5 kg.  She is 83.2 kg on today's visit.  Labs significant for mild hyperkalemia, elevated creatinine, hypocalcemia.  She is anemic and this is near her baseline.  On record review she has had multiple emergency department visits for similar symptoms and has received multiple repeat images of her chest abdomen pelvis to evaluate for her complaints of chest swelling and flank pain.  Do not feel that she warrants additional imaging at this time as the risks of imaging and complication secondary to the imaging outweigh the benefits.  Discussed with Dr. Signe Colt with nephrology.  Plan to have the patient receive dialysis and she will return to the emergency department for repeat evaluation.        Final Clinical Impression(s) / ED Diagnoses Final diagnoses:  Other hypervolemia  ESRD needing dialysis [facility])    Rx / DC Orders ED Discharge Orders     None         Tilden Fossa, MD 11/08/22 [phone]

## 2022-11-08 NOTE — Progress Notes (Signed)
HD Nursing Progress:   1250: Hand over received from [employer]           Initially presented to ED due to flank pain.  1300: Received patient from ED.           Patient is alert and oriented, no complains at this time, except:           - pain on lower back/flank, not in obvious distress, on RA.           Claimed her last HD was last week, and her EDW is 77.5Kg.           Claimed no outpatient clinic and will just go to ER ofor HD's.           Baseline vitals: 157/100, 71, 97.6, 15, 100% on RA.           Pre-HD weight = 83.2 Kg (as recorded by ED).           RUE AVF cannulated without issues with 15G.  1309: Started HD treatment as prescribed (please see HD treatment flow sheet).           UF goal 3L, patient requested 4L, PA-C Kathie agreed.  1640: HD treatment completed, tolerated fairly well.           - denies cramping, no lightheadedness, no chest pain.           - denies nausea / no vomiting. NAD.                     No clotting / no loss of blood during HD.           RUE Access: dressing applied, no bleeding, kindly exposed per policy.           Ordered duration: 3.5 hours, completed.           UF removed (net): (goal met for this session).           Other medication(s) given in HD: IV Benadryl.           Post-HD weight = 75.4 Kg (standing scale).   1715: Hand over to [employer], to [place] [facility].

## 2022-11-08 NOTE — ED Provider Notes (Signed)
7:29 AM Plan for reassessment after dialysis.  Patient will likely return to the emergency department after dialysis to reassess to see if she needs admission or is safe for discharge home for outpatient management.   Evelyn Bryant, Canary Brim, MD 11/08/22 [phone]

## 2022-11-08 NOTE — ED Notes (Signed)
Patient transported to CT 

## 2022-11-08 NOTE — Progress Notes (Signed)
 Kidney Renal Quick Note:  S: Consulted for dialysis. Known ESRD d/t SLE, does not currently have an outpatient HD unit. Lives in [place] and typically goes to the ED there when gets symptomatic - last HD 1 week ago. Was visiting [place] and developed symptoms so presented here. Has known SVC syndrome and gets B breast edema and neck edema when gets overloaded. Also reports B flank pains which she tells me is new, but apparently has been evaluated in the past - tells me had spontaneous retroperitoneal bleed earlier this year. Still make urine, no hematuria. No fever or chills. In CareEverywhere - s/p abd CT 11/01/22 as well as CTA. Both read stable from prior (known vascular issues), did show L perinephric hematoma "slightly decreased from prior."  PMHX: ESRD SLE SVC syndrome - on Eliquis HTN  O:  Vitals:   11/08/22 0815 11/08/22 1030  BP: 126/80 (!) 141/96  Pulse: 69 72  Resp: 16 15  Temp:    SpO2: 97% 98%   GEN: Well appearing woman, NAD NECK: Dilated veins in neck and chest CV: RRR; 4/6 murmur PULM: CTAB; no rales ABD: soft BACK: TTP in B flanks, L > R EXTREM: 1+ BLE pitting edema ACCESS: RUE AVF + bruit/thrill, no erythema or signs of infection.     Latest Ref Rng & Units 11/07/2022   10:56 PM 07/01/2022    2:20 PM 01/22/2022    9:57 PM  CBC  WBC 4.0 - 10.5 K/uL 3.3  3.1  4.0   Hemoglobin 12.0 - 15.0 g/dL 8.2  8.0  86.5   Hematocrit 36.0 - 46.0 % 25.0  24.4  31.2   Platelets 150 - 400 K/uL 123  162  219    Lab Results  Component Value Date   NA 136 11/07/2022   K 5.2 (H) 11/07/2022   CO2 20 (L) 11/07/2022   GLUCOSE 92 11/07/2022   BUN 77 (H) 11/07/2022   CREATININE >18.00 (H) 11/07/2022   CALCIUM 6.1 (LL) 11/07/2022   GFRNONAA 3 (L) 11/07/2022    A/P:  ESRD Will dialyze today and then return to the ED for further evaluation of symptoms. Would benefit from dose of Aranesp - unclear when was last given. Hgb 9.4 on 8/13. Will do full consult if  admitted.  Ozzie Hoyle, PA-C [employer] Pager [phone]

## 2022-11-08 NOTE — ED Notes (Signed)
Pt drank ginger ale and crackers

## 2022-11-08 NOTE — Procedures (Signed)
I was present at this dialysis session, have reviewed the session and made  appropriate changes Vinson Moselle MD  [facility] 11/08/2022, 1:36 PM

## 2022-11-08 NOTE — ED Provider Notes (Signed)
  Physical Exam  BP (!) 154/106 (BP Location: Left Wrist)   Pulse 72   Temp 98.1 F (36.7 C) (Oral)   Resp 17   Ht 5\' 6"  (1.676 m)   Wt 83.2 kg Comment: from ED weight  LMP  (LMP Unknown)   SpO2 100%   BMI 29.61 kg/m     Procedures  Procedures  ED Course / MDM    Medical Decision Making Amount and/or Complexity of Data Reviewed Labs: ordered. Radiology: ordered.  Risk Prescription drug management.   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 Has returned from dialysis and continues to complain of nausea and flank pain. Has a hx of renal hematoma s/p IR embolization.    Of note, the patient recently underwent CT SVC protocol at [facility] one week ago: Impression:  1. Stent extending from right subclavian vein to the SVC which is patent  with mild stenosis in the right subclavian and brachiocephalic vein caused  by mild peripheral thrombus.   2. Occluded and/or severely stenosed left internal jugular and left  brachiocephalic veins. Focal high grade stenosis in the inferior right  internal jugular vein. These findings are similar to imaging from 08/04/22.  3. Same day abdomen CT is separately reported.  4. Extensive body wall collateralization with body wall edema; mild  presumably reactive bilateral axillary adenopathy   She states that her sx remain unchanged compared to when she underwent that CT. She endorses continued swelling of her breasts, right more than left, with dilated superficial neck veins consistent with her known SVC syndrome. She remains on Eliquis. She does endorse worsening n/v and abd pain which she says feels similar to the last time she needed to have a renal embolization.  CTA Abd/Pelvis: IMPRESSION:  1. Mild  asymmetric left perinephric edema is nonspecific but may be  related to recent embolization/resolving hematoma given history. No  evidence of active bleeding.  2. Extensive venous collaterals in the left anterolateral abdominal  wall and chest wall edema likely related to history of SVC syndrome.    PT administered Dilaudid and Phenergan, feeling improvement in sx. Tolerating PO. Stable for discharge.     Ernie Avena, MD 11/08/22 2051

## 2022-11-08 NOTE — Discharge Instructions (Addendum)
Your CT imaging is overall reassuring.  Recommend you follow-up outpatient for consideration for regularly scheduled dialysis, return if you need emergent dialysis. Continue to take your Eliquis

## 2022-12-18 ENCOUNTER — Encounter (HOSPITAL_COMMUNITY): Payer: Self-pay | Admitting: *Deleted

## 2022-12-18 ENCOUNTER — Emergency Department (HOSPITAL_COMMUNITY): Payer: 59

## 2022-12-18 ENCOUNTER — Other Ambulatory Visit: Payer: Self-pay

## 2022-12-18 ENCOUNTER — Emergency Department (HOSPITAL_COMMUNITY)
Admission: EM | Admit: 2022-12-18 | Discharge: 2022-12-18 | Disposition: A | Discharge status: Home / Self Care | Payer: 59 | Attending: Emergency Medicine | Admitting: Emergency Medicine

## 2022-12-18 DIAGNOSIS — I120 Hypertensive chronic kidney disease with stage 5 chronic kidney disease or end stage renal disease: Secondary | ICD-10-CM | POA: Insufficient documentation

## 2022-12-18 DIAGNOSIS — D631 Anemia in chronic kidney disease: Secondary | ICD-10-CM | POA: Insufficient documentation

## 2022-12-18 DIAGNOSIS — N186 End stage renal disease: Secondary | ICD-10-CM | POA: Insufficient documentation

## 2022-12-18 DIAGNOSIS — D649 Anemia, unspecified: Secondary | ICD-10-CM | POA: Insufficient documentation

## 2022-12-18 DIAGNOSIS — J45909 Unspecified asthma, uncomplicated: Secondary | ICD-10-CM | POA: Diagnosis not present

## 2022-12-18 DIAGNOSIS — Z5329 Procedure and treatment not carried out because of patient's decision for other reasons: Secondary | ICD-10-CM | POA: Insufficient documentation

## 2022-12-18 DIAGNOSIS — E875 Hyperkalemia: Secondary | ICD-10-CM | POA: Insufficient documentation

## 2022-12-18 DIAGNOSIS — Z79899 Other long term (current) drug therapy: Secondary | ICD-10-CM | POA: Insufficient documentation

## 2022-12-18 DIAGNOSIS — Z7901 Long term (current) use of anticoagulants: Secondary | ICD-10-CM | POA: Diagnosis not present

## 2022-12-18 DIAGNOSIS — Z992 Dependence on renal dialysis: Secondary | ICD-10-CM | POA: Insufficient documentation

## 2022-12-18 DIAGNOSIS — R109 Unspecified abdominal pain: Secondary | ICD-10-CM | POA: Diagnosis present

## 2022-12-18 NOTE — ED Triage Notes (Addendum)
Dry weight 77.5KG Current weight 82.25KG

## 2022-12-18 NOTE — ED Triage Notes (Signed)
Pt reporting breast swelling, bilateral flank pain, nausea and vomiting since Thursday. Last dialysis 9/24, does not have a treatment center and goes to the ED for her dialysis.

## 2022-12-18 NOTE — ED Notes (Signed)
AVS printed. Attempted to discussed with pt. Pt found to have already departed.

## 2022-12-18 NOTE — ED Provider Notes (Signed)
[place] EMERGENCY DEPARTMENT AT [facility] Provider Note   CSN: [id number] Arrival date & time: 12/18/22  2016     History  Chief Complaint  Patient presents with   Flank Pain    Evelyn Bryant is a 37 y.o. female.  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  Patient reports that her primary residence is in [facility].  She reports no current scheduled outpatient dialysis treatment.  Reports that she is visiting family here in [place].   She complains of whole body pain.  She reports that her last dialysis was on 9/24 at [facility].    The history is provided by the patient and medical records.       Home Medications Prior to Admission medications   Medication Sig Start Date End Date Taking? Authorizing Provider  amLODipine (NORVASC) 10 MG tablet Take 10 mg by mouth daily. 04/12/17   [provider]  apixaban (ELIQUIS) 2.5 MG TABS tablet Take 1 tablet by mouth 2 (two) times daily. 07/11/22   [provider]  calcium acetate (PHOSLO) 667 MG capsule Take 667 mg by mouth 3 (three) times daily with meals.    [provider]  furosemide (LASIX) 20 MG tablet Take 20 mg by mouth daily. For 30 days 10/20/22 10/20/23  [provider]  hydrALAZINE (APRESOLINE) 25 MG tablet Take 25 mg by mouth in the morning and at bedtime. 08/03/21 05/27/23  [provider]  pantoprazole (PROTONIX) 40 MG tablet Take 40 mg by mouth 2 (two) times daily. 03/26/22   [provider]      Allergies    Ace inhibitors, Acetaminophen, Codeine, Enalapril, Fentanyl, Hydrocodone, Oxycodone, Sulfa antibiotics, and Nsaids    Review of Systems   Review of Systems  All other systems reviewed and are negative.   Physical Exam Updated Vital Signs BP  (!) 154/100   Pulse 94   Temp 97.9 F (36.6 C) (Oral)   Resp (!) 27   SpO2 100%  Physical Exam Vitals and nursing note reviewed.  Constitutional:      General: She is not in acute distress.    Appearance: Normal appearance. She is well-developed.  HENT:     Head: Normocephalic and atraumatic.  Eyes:     Conjunctiva/sclera: Conjunctivae normal.     Pupils: Pupils are equal, round, and reactive to light.  Cardiovascular:     Rate and Rhythm: Normal rate and regular rhythm.     Heart sounds: Normal heart sounds.  Pulmonary:     Effort: Pulmonary effort is normal. No respiratory distress.     Breath sounds: Normal breath sounds.  Abdominal:     General: There is no distension.     Palpations: Abdomen is soft.     Tenderness: There is no abdominal tenderness.  Musculoskeletal:        General: No deformity. Normal range of motion.     Cervical back: Normal range of motion and neck supple.     Comments: Fistula in right upper extremity with palpable thrill.  Skin:    General: Skin is warm and dry.  Neurological:     General: No focal deficit present.     Mental Status: She is alert and oriented to person, place, and time.     ED Results / Procedures /  Treatments   Labs (all labs ordered are listed, but only abnormal results are displayed) Labs Reviewed  LIPASE, BLOOD  COMPREHENSIVE METABOLIC PANEL  CBC    EKG EKG Interpretation Date/Time:  Sunday December 18 2022 20:26:02 EDT Ventricular Rate:  86 PR Interval:  205 QRS Duration:  102 QT Interval:  385 QTC Calculation: 461 R Axis:   45  Text Interpretation: Sinus rhythm Borderline prolonged PR interval Confirmed by Kristine Royal [phone]) on 12/18/2022 8:59:29 PM  Radiology No results found.  Procedures Procedures    Medications Ordered in ED Medications - No data to display  ED Course/ Medical Decision Making/ A&P                                 Medical Decision Making Amount and/or Complexity of Data  Reviewed Labs: ordered. Radiology: ordered.    Medical Screen Complete  This patient presented to the ED with complaint of ESRD.  This complaint involves an extensive number of treatment options. The initial differential diagnosis includes, but is not limited to, [place]ick abnormality  This presentation is: Acute, Chronic, Self-Limited, Previously Undiagnosed, Uncertain Prognosis, Complicated, Systemic Symptoms, and Threat to Life/Bodily Function  Patient with complex prior medical history and no current outpatient dialysis center.  She presents reporting last dialysis was 5 days prior.    Last dialysis session was completed while patient was at [facility].  Primary residence appears to be in [facility].  Screening labs ordered.  Shortly after labs ordered the patient apparently decided that she wanted to leave AGAINST MEDICAL ADVICE.  She has capacity to do so.  She was made aware of the fact that dialysis services were not available at [facility].  She was informed that if she required emergent dialysis she would need to be transported to [facility] for emergent dialysis.  She reports that she is "leaving because it is taking too long".  Patient is aware that if she decides to return / seek medical care she should absolutely do so.  Patient ambulated steadily and without difficulty from the ED.   Additional history obtained: External records from outside sources obtained and reviewed including prior ED visits and prior Inpatient records.    Lab Tests:  I ordered and personally interpreted labs.  The pertinent results include: CBC, CMP, lipase   Imaging Studies ordered:  I ordered imaging studies including CXR      Cardiac Monitoring:  The patient was maintained on a cardiac monitor.  I personally viewed and interpreted the cardiac monitor which showed an underlying rhythm of: NSR   Problem List / ED Course:  ESRD, missed dialysis   Reevaluation:  After  the interventions noted above, I reevaluated the patient and found that they have: unchanged  Disposition:  After consideration of the diagnostic results and the patients response to treatment, I feel that the patent would benefit from completion of ED evaluation/close outpatient follow-up.          Final Clinical Impression(s) / ED Diagnoses Final diagnoses:  ESRD (end stage renal disease) (HCC)    Rx / DC Orders ED Discharge Orders     None         Wynetta Fines, MD 12/18/22 2216

## 2022-12-18 NOTE — ED Notes (Signed)
Went to check on the pt as she refused CXR. Found pt disconnecting herself form the monitor stating that she wanted to leave. Sts she has been sitting here with nausea and pain. Sts the doctor hasn't seen here but once. Informed plan of IV team per her request has been placed and are awaiting for IV placement. Replied by saying "I didn't ask you that." Informed MD of pts refusal of care.

## 2022-12-18 NOTE — ED Notes (Signed)
MD at bedside. 

## 2022-12-18 NOTE — ED Triage Notes (Signed)
Signed      Pt reporting breast swelling, bilateral flank pain, nausea and vomiting since Thursday. Last dialysis 9/24, does not have a treatment center and goes to the ED for her dialysis. Last dialysis treatment 9/24 VSS NAD PT on room air. Pt left before being seen at [facility] earlier.

## 2022-12-18 NOTE — ED Notes (Signed)
Pt sts she does not want to be stuck prior to seeing the MD. Sts she is stuck multiple times and is requesting an [place] IV. Awaiting EDP to assign to pt. Pt on VS monitor. Call bell within reach

## 2022-12-18 NOTE — Discharge Instructions (Signed)
Return for any problem.  ?

## 2022-12-18 NOTE — ED Notes (Signed)
Pt stated that she is a hard stick.

## 2022-12-19 ENCOUNTER — Encounter (HOSPITAL_BASED_OUTPATIENT_CLINIC_OR_DEPARTMENT_OTHER): Payer: Self-pay | Admitting: Emergency Medicine

## 2022-12-19 ENCOUNTER — Emergency Department (HOSPITAL_COMMUNITY)
Admission: EM | Admit: 2022-12-19 | Discharge: 2022-12-19 | Disposition: A | Discharge status: Home / Self Care | Payer: 59 | Source: Home / Self Care | Attending: Emergency Medicine | Admitting: Emergency Medicine

## 2022-12-19 DIAGNOSIS — Z7901 Long term (current) use of anticoagulants: Secondary | ICD-10-CM | POA: Diagnosis not present

## 2022-12-19 DIAGNOSIS — Z79899 Other long term (current) drug therapy: Secondary | ICD-10-CM | POA: Diagnosis not present

## 2022-12-19 DIAGNOSIS — I120 Hypertensive chronic kidney disease with stage 5 chronic kidney disease or end stage renal disease: Secondary | ICD-10-CM | POA: Diagnosis not present

## 2022-12-19 DIAGNOSIS — N186 End stage renal disease: Secondary | ICD-10-CM | POA: Diagnosis not present

## 2022-12-19 DIAGNOSIS — R109 Unspecified abdominal pain: Secondary | ICD-10-CM | POA: Diagnosis present

## 2022-12-19 DIAGNOSIS — J45909 Unspecified asthma, uncomplicated: Secondary | ICD-10-CM | POA: Diagnosis not present

## 2022-12-19 DIAGNOSIS — Z992 Dependence on renal dialysis: Secondary | ICD-10-CM | POA: Diagnosis not present

## 2022-12-19 DIAGNOSIS — D638 Anemia in other chronic diseases classified elsewhere: Secondary | ICD-10-CM

## 2022-12-19 DIAGNOSIS — E875 Hyperkalemia: Secondary | ICD-10-CM

## 2022-12-19 DIAGNOSIS — D631 Anemia in chronic kidney disease: Secondary | ICD-10-CM | POA: Diagnosis not present

## 2022-12-19 DIAGNOSIS — Z5329 Procedure and treatment not carried out because of patient's decision for other reasons: Secondary | ICD-10-CM | POA: Diagnosis not present

## 2022-12-19 LAB — COMPREHENSIVE METABOLIC PANEL
ALT: 9 U/L (ref 0–44)
AST: 14 U/L — ABNORMAL LOW (ref 15–41)
Albumin: 4.2 g/dL (ref 3.5–5.0)
Alkaline Phosphatase: 182 U/L — ABNORMAL HIGH (ref 38–126)
Anion gap: 17 — ABNORMAL HIGH (ref 5–15)
BUN: 82 mg/dL — ABNORMAL HIGH (ref 6–20)
CO2: 23 mmol/L (ref 22–32)
Calcium: 8.5 mg/dL — ABNORMAL LOW (ref 8.9–10.3)
Chloride: 99 mmol/L (ref 98–111)
Creatinine, Ser: 13.18 mg/dL — ABNORMAL HIGH (ref 0.44–1.00)
GFR, Estimated: 3 mL/min — ABNORMAL LOW (ref >60)
Glucose, Bld: 83 mg/dL (ref 70–99)
Potassium: 7 mmol/L (ref 3.5–5.1)
Sodium: 139 mmol/L (ref 135–145)
Total Bilirubin: 0.3 mg/dL (ref 0.3–1.2)
Total Protein: 7.3 g/dL (ref 6.5–8.1)

## 2022-12-19 LAB — CBC
HCT: 20.4 % — ABNORMAL LOW (ref 36.0–46.0)
Hemoglobin: 6.6 g/dL — CL (ref 12.0–15.0)
MCH: 29.2 pg (ref 26.0–34.0)
MCHC: 32.4 g/dL (ref 30.0–36.0)
MCV: 90.3 fL (ref 80.0–100.0)
Platelets: 183 10*3/uL (ref 150–400)
RBC: 2.26 MIL/uL — ABNORMAL LOW (ref 3.87–5.11)
RDW: 16 % — ABNORMAL HIGH (ref 11.5–15.5)
WBC: 4.3 10*3/uL (ref 4.0–10.5)
nRBC: 0 % (ref 0.0–0.2)

## 2022-12-19 LAB — HEPATITIS B SURFACE ANTIGEN: Hepatitis B Surface Ag: NONREACTIVE

## 2022-12-19 LAB — ABO/RH: ABO/RH(D): O POS

## 2022-12-19 LAB — LIPASE, BLOOD: Lipase: 37 U/L (ref 11–51)

## 2022-12-19 LAB — PREPARE RBC (CROSSMATCH)

## 2022-12-19 MED ORDER — DIPHENHYDRAMINE HCL 50 MG/ML IJ SOLN: 25.0000 mg | INTRAMUSCULAR | Status: DC | PRN

## 2022-12-19 MED ORDER — SODIUM CHLORIDE 0.9 % IV SOLN
12.5000 mg | Freq: Once | INTRAVENOUS | Status: AC
  Administered 2022-12-19: 12.5 mg via INTRAVENOUS
  Filled 2022-12-19: qty 12.5

## 2022-12-19 MED ORDER — METOCLOPRAMIDE HCL 5 MG/ML IJ SOLN
10.0000 mg | Freq: Once | INTRAMUSCULAR | Status: DC
  Filled 2022-12-19: qty 2

## 2022-12-19 MED ORDER — PENTAFLUOROPROP-TETRAFLUOROETH EX AERO: 1.0000 | INHALATION_SPRAY | CUTANEOUS | Status: DC | PRN

## 2022-12-19 MED ORDER — CHLORHEXIDINE GLUCONATE CLOTH 2 % EX PADS: 6.0000 | MEDICATED_PAD | Freq: Every day | CUTANEOUS | Status: DC

## 2022-12-19 MED ORDER — LIDOCAINE HCL (PF) 1 % IJ SOLN: 5.0000 mL | INTRAMUSCULAR | Status: DC | PRN

## 2022-12-19 MED ORDER — PROMETHAZINE HCL 25 MG RE SUPP: 25.0000 mg | Freq: Four times a day (QID) | RECTAL | 0 refills | Status: AC | PRN

## 2022-12-19 MED ORDER — DIPHENHYDRAMINE HCL 50 MG/ML IJ SOLN
INTRAMUSCULAR | Status: AC
  Administered 2022-12-19: 25 mg via INTRAVENOUS
  Filled 2022-12-19: qty 1

## 2022-12-19 MED ORDER — HEPARIN SODIUM (PORCINE) 1000 UNIT/ML DIALYSIS: 1000.0000 [IU] | INTRAMUSCULAR | Status: DC | PRN

## 2022-12-19 MED ORDER — ONDANSETRON 4 MG PO TBDP
4.0000 mg | ORAL_TABLET | Freq: Once | ORAL | Status: AC
  Administered 2022-12-19: 4 mg via ORAL
  Filled 2022-12-19: qty 1

## 2022-12-19 MED ORDER — SODIUM ZIRCONIUM CYCLOSILICATE 10 G PO PACK
10.0000 g | PACK | Freq: Once | ORAL | Status: AC
  Administered 2022-12-19: 10 g via ORAL
  Filled 2022-12-19: qty 1

## 2022-12-19 MED ORDER — SODIUM CHLORIDE 0.9% IV SOLUTION: Freq: Once | INTRAVENOUS | Status: DC

## 2022-12-19 MED ORDER — ANTICOAGULANT SODIUM CITRATE 4% (200MG/5ML) IV SOLN: 5.0000 mL | Status: DC | PRN

## 2022-12-19 MED ORDER — ALTEPLASE 2 MG IJ SOLR: 2.0000 mg | Freq: Once | INTRAMUSCULAR | Status: DC | PRN

## 2022-12-19 MED ORDER — TRAMADOL HCL 50 MG PO TABS: 50.0000 mg | ORAL_TABLET | Freq: Four times a day (QID) | ORAL | 0 refills | Status: AC | PRN

## 2022-12-19 MED ORDER — LIDOCAINE-PRILOCAINE 2.5-2.5 % EX CREA: 1.0000 | TOPICAL_CREAM | CUTANEOUS | Status: DC | PRN

## 2022-12-19 MED ORDER — HYDROMORPHONE HCL 1 MG/ML IJ SOLN
1.0000 mg | Freq: Once | INTRAMUSCULAR | Status: AC
  Administered 2022-12-19: 1 mg via INTRAVENOUS
  Filled 2022-12-19: qty 1

## 2022-12-19 NOTE — Discharge Instructions (Signed)
Your workup today revealed you did have anemia and needed dialysis.  They were able to dialyze you and give you blood.  Please use the pain and nausea medicine to help with your symptoms and rest and stay hydrated as best you can.  Please follow-up with your primary team back in [place] as soon as possible.  If any symptoms change or worsen acutely, return to the nearest emergency department.

## 2022-12-19 NOTE — ED Provider Notes (Signed)
[place] EMERGENCY DEPARTMENT AT [facility] Provider Note   CSN: [id number] Arrival date & time: 12/18/22  2312     History  Chief Complaint  Patient presents with   Nausea    Evelyn Bryant is a 37 y.o. female.  HPI     This is a 37 year old female with a history of end-stage renal disease on dialysis, lupus, antiphospholipid syndrome, severe vena cava syndrome who presents with swelling and pain all over.  Patient reports that she is visiting [place].  She does not currently have a scheduled dialysis center.  She last dialyzed on Tuesday at [facility] in [place].  She is complaining mostly of whole body pain and swelling of the breasts and legs.  No shortness of breath.  No fevers.  She does report nausea.  Home Medications Prior to Admission medications   Medication Sig Start Date End Date Taking? Authorizing Provider  amLODipine (NORVASC) 10 MG tablet Take 10 mg by mouth daily. 04/12/17   [provider]  apixaban (ELIQUIS) 2.5 MG TABS tablet Take 1 tablet by mouth 2 (two) times daily. 07/11/22   [provider]  calcium acetate (PHOSLO) 667 MG capsule Take 667 mg by mouth 3 (three) times daily with meals.    [provider]  furosemide (LASIX) 20 MG tablet Take 20 mg by mouth daily. For 30 days 10/20/22 10/20/23  [provider]  hydrALAZINE (APRESOLINE) 25 MG tablet Take 25 mg by mouth in the morning and at bedtime. 08/03/21 05/27/23  [provider]  pantoprazole (PROTONIX) 40 MG tablet Take 40 mg by mouth 2 (two) times daily. 03/26/22   [provider]      Allergies    Ace inhibitors, Acetaminophen, Codeine, Enalapril, Fentanyl, Hydrocodone, Oxycodone, Sulfa antibiotics, and Nsaids    Review of Systems   Review of Systems  Constitutional:  Negative for fever.  Respiratory:  Negative for shortness of breath.   Cardiovascular:  Positive for leg swelling.  All other systems reviewed and are negative.   Physical  Exam Updated Vital Signs BP (!) 159/104   Pulse 86   Temp 98 F (36.7 C) (Oral)   Resp (!) 22   Ht 1.676 m (5\' 6" )   Wt 82.2 kg   LMP  (LMP Unknown)   SpO2 100%   BMI 29.25 kg/m  Physical Exam Vitals and nursing note reviewed.  Constitutional:      Appearance: She is well-developed. She is obese.     Comments: Chronically ill-appearing  HENT:     Head: Normocephalic and atraumatic.     Mouth/Throat:     Mouth: Mucous membranes are moist.  Eyes:     Pupils: Pupils are equal, round, and reactive to light.  Neck:     Comments: Dilated neck veins Cardiovascular:     Rate and Rhythm: Normal rate and regular rhythm.     Heart sounds: Murmur heard.  Pulmonary:     Effort: Pulmonary effort is normal. No respiratory distress.     Breath sounds: No wheezing.  Abdominal:     Palpations: Abdomen is soft.     Tenderness: There is no abdominal tenderness. There is no guarding or rebound.  Musculoskeletal:     Cervical back: Neck supple.     Right lower leg: Edema present.     Left lower leg: Edema present.     Comments: Fistula right upper extremity  Skin:    General: Skin is warm and dry.  Neurological:  Mental Status: She is alert and oriented to person, place, and time.  Psychiatric:        Mood and Affect: Mood normal.     ED Results / Procedures / Treatments   Labs (all labs ordered are listed, but only abnormal results are displayed) Labs Reviewed  COMPREHENSIVE METABOLIC PANEL - Abnormal; Notable for the following components:      Result Value   Potassium 7.0 (*)    BUN 82 (*)    Creatinine, Ser 13.18 (*)    Calcium 8.5 (*)    AST 14 (*)    Alkaline Phosphatase 182 (*)    GFR, Estimated 3 (*)    Anion gap 17 (*)    All other components within normal limits  CBC - Abnormal; Notable for the following components:   RBC 2.26 (*)    Hemoglobin 6.6 (*)    HCT 20.4 (*)    RDW 16.0 (*)    All other components within normal limits  LIPASE, BLOOD  URINALYSIS,  ROUTINE W REFLEX MICROSCOPIC  PREGNANCY, URINE    EKG None  Radiology No results found.  Procedures .Critical Care  Performed by: Shon Baton, MD Authorized by: Shon Baton, MD   Critical care provider statement:    Critical care time (minutes):  45   Critical care was necessary to treat or prevent imminent or life-threatening deterioration of the following conditions:  Renal failure and metabolic crisis   Critical care was time spent personally by me on the following activities:  Development of treatment plan with patient or surrogate, discussions with consultants, evaluation of patient's response to treatment, examination of patient, ordering and review of laboratory studies, ordering and review of radiographic studies, ordering and performing treatments and interventions, pulse oximetry, re-evaluation of patient's condition and review of old charts     Medications Ordered in ED Medications  ondansetron (ZOFRAN-ODT) disintegrating tablet 4 mg (has no administration in time range)  sodium zirconium cyclosilicate (LOKELMA) packet 10 g (10 g Oral Given 12/19/22 0216)    ED Course/ Medical Decision Making/ A&P Clinical Course as of 12/19/22 0224  Mon Dec 19, 2022  0223 Spoke with Dr. Juel Burrow, nephrology.  Recommends ED to ED transfer for evaluation for dialysis.  Patient given [facility] for hyperkalemia. [CH]    Clinical Course User Index [CH] Ascencion Coye, Mayer Masker, MD                                 Medical Decision Making Amount and/or Complexity of Data Reviewed Labs: ordered.  Risk Prescription drug management.   This patient presents to the ED for concern of whole body pain, edema, this involves an extensive number of treatment options, and is a complaint that carries with it a high risk of complications and morbidity.  I considered the following differential and admission for this acute, potentially life threatening condition.  The differential diagnosis includes  chronic pain, edema secondary to fluid shifts and need for dialysis  MDM:    This is a 37 year old female who presents with whole body pain and general edema.  Body weight is up approximately 5 kg.  She has not had dialysis in 5 days.  EKG shows no evidence of acute arrhythmia ischemia.  She is hypertensive but otherwise her vital signs are reassuring.  Labs notable for potassium of 7.0.  This without EKG changes.  Patient was given Lokelma.  Additionally she  noted to have a hemoglobin of 6.6.  Suspect she will need some blood when she gets dialysis.  She has chronic pain.  She is requesting pain medication but has multiple allergies to medications that are oral.  Do not feel she has an indication for IV pain medication.  She was given Zofran for nausea.  Discussed with nephrology.  Will plan for transfer to [facility] for nephrology evaluation and emergent dialysis.  Will need type and screen upon arrival.  (Labs, imaging, consults)  Labs: I Ordered, and personally interpreted labs.  The pertinent results include: CBC, CMP  Imaging Studies ordered: I ordered imaging studies including none I independently visualized and interpreted imaging. I agree with the radiologist interpretation  Additional history obtained from chart review.  External records from outside source obtained and reviewed including prior evaluations  Cardiac Monitoring: The patient was maintained on a cardiac monitor.  If on the cardiac monitor, I personally viewed and interpreted the cardiac monitored which showed an underlying rhythm of: Sinus rhythm  Reevaluation: After the interventions noted above, I reevaluated the patient and found that they have :stayed the same  Social Determinants of Health:  lives independently  Disposition: Discharge  Co morbidities that complicate the patient evaluation  Past Medical History:  Diagnosis Date   Kidney failure    Lupus (HCC)    Superior vena cava syndrome       Medicines Meds ordered this encounter  Medications   sodium zirconium cyclosilicate (LOKELMA) packet 10 g   ondansetron (ZOFRAN-ODT) disintegrating tablet 4 mg   DISCONTD: metoCLOPramide (REGLAN) injection 10 mg   promethazine (PHENERGAN) 12.5 mg in sodium chloride 0.9 % 50 mL IVPB   Chlorhexidine Gluconate Cloth 2 % PADS 6 each   pentafluoroprop-tetrafluoroeth (GEBAUERS) aerosol 1 Application   lidocaine (PF) (XYLOCAINE) 1 % injection 5 mL   lidocaine-prilocaine (EMLA) cream 1 Application   heparin injection 1,000 Units   anticoagulant sodium citrate solution 5 mL   alteplase (CATHFLO ACTIVASE) injection 2 mg    I have reviewed the patients home medicines and have made adjustments as needed  Problem List / ED Course: Problem List Items Addressed This Visit   None Visit Diagnoses     Hyperkalemia    -  Primary   ESRD (end stage renal disease) (HCC)       Anemia of chronic disease                       Final Clinical Impression(s) / ED Diagnoses Final diagnoses:  Hyperkalemia  ESRD (end stage renal disease) (HCC)  Anemia of chronic disease    Rx / DC Orders ED Discharge Orders     None         Shon Baton, MD 12/19/22 [phone]

## 2022-12-19 NOTE — ED Notes (Signed)
[facility] at bedside to transport pt to [place] 

## 2022-12-19 NOTE — Progress Notes (Signed)
   12/19/22 1358  Vitals  Temp (!) 97.4 F (36.3 C)  Pulse Rate 87  Resp (!) 28  BP (!) 155/107  SpO2 100 %  O2 Device Room Air  Weight 78.3 kg  Type of Weight Post-Dialysis  Oxygen Therapy  Patient Activity (if Appropriate) In bed  Post Treatment  Dialyzer Clearance Lightly streaked  Hemodialysis Intake (mL) 200 mL  Liters Processed 102  Fluid Removed (mL) 4500 mL  Tolerated HD Treatment Yes  AVG/AVF Arterial Site Held (minutes) 10 minutes  AVG/AVF Venous Site Held (minutes) 10 minutes   Received patient in bed to unit.  Alert and oriented.  Informed consent signed and in chart.   TX duration:4.15--ran longer to completed  Patient tolerated well.  Transported back to the room  Alert, without acute distress.  Hand-off given to patient's nurse.   Access used: RUAF Access issues: no complications  Total UF removed: 4300 Medication(s) given: benedryl 25mg  iv x1 I unit PRBCs given--no s/sof any transfusion reactions   [facility]

## 2022-12-19 NOTE — Procedures (Signed)
Brief note (for ED HD):  ESRD pt discharged from the unit in [facility]. Last dialysis treatment was last Tuesday. According to the patient her EDW is 77.5kg and she is not making any urine anymore.  She is in town visiting her sister and lives in the [place] area. She has an appt with [facility] in the near future. Exam w/o resp distress, neuro intact. Goal UF is 4 L. We be sent back to ED when HD is completed.   I was present at this dialysis session, have reviewed the session and made  appropriate changes Vinson Moselle MD  [facility] 12/19/2022, 12:19 PM

## 2022-12-19 NOTE — ED Notes (Signed)
Dr. Wilkie Aye aware of 7.0 Potassium and Hgb 6.6

## 2022-12-19 NOTE — ED Notes (Signed)
Report given to [employer] at [facility].

## 2022-12-19 NOTE — ED Notes (Signed)
Report received from [facility]. 

## 2022-12-19 NOTE — ED Notes (Signed)
Patient transported to dialysis

## 2022-12-19 NOTE — ED Notes (Signed)
Report given to [place] at [facility]

## 2022-12-19 NOTE — ED Provider Notes (Signed)
7:16 AM Care assumed from Dr. Blinda Leatherwood while patient is awaiting dialysis.  Labs reviewed and patient is anemic as well.  Will contact the nephrology team to make sure they do not want [place] to assist with any blood transfusion or if patient would need admission after dialysis to start that process.  2:52 PM Patient returned from dialysis after getting blood and getting dialysis.  She is still having some nausea and pain so we will give her a dose of the medications.  She reports that Dilaudid is the only medicine that is worked in the past we will give a dose of Dilaudid and some Phenergan.  She does want to go home to see her sister now.  After medicine we will discharge her.  Will send in prescription for some pain medicine as she reports Ultram works for her and Phenergan suppository.  She will follow-up with her team in [place].  Patient agrees and will be discharged after p.o. challenge.     Evelyn Bryant, Canary Brim, MD 12/19/22 [phone]

## 2022-12-19 NOTE — Progress Notes (Signed)
Seen briefly and wrote dialysis orders for her. She is ESRD discharged from the unit in [facility]; her last dialysis treatment was last Tuesday. According to the patient her EDW is 77.5kg and she is not making any urine anymore.  Planning on a 4hr treatment utilizing her rt BBT with 1K 1st hour. She is only in town visiting her sister and lives in the [place] area; she actually has an appt with [facility] in the near future.

## 2022-12-20 LAB — BPAM RBC
Blood Product Expiration Date: 202410222359
ISSUE DATE / TIME: 202409301233
Unit Type and Rh: 5100

## 2022-12-20 LAB — TYPE AND SCREEN
ABO/RH(D): O POS
Antibody Screen: NEGATIVE
Unit division: 0

## 2022-12-20 LAB — HEPATITIS B SURFACE ANTIBODY, QUANTITATIVE: Hep B S AB Quant (Post): 193 m[IU]/mL

## 2023-11-28 ENCOUNTER — Emergency Department (HOSPITAL_COMMUNITY)
Admission: EM | Admit: 2023-11-28 | Discharge: 2023-11-30 | Disposition: A | Discharge status: Home / Self Care | Payer: MEDICAID | Attending: Emergency Medicine | Admitting: Emergency Medicine

## 2023-11-28 ENCOUNTER — Encounter (HOSPITAL_COMMUNITY): Payer: Self-pay

## 2023-11-28 ENCOUNTER — Other Ambulatory Visit: Payer: Self-pay

## 2023-11-28 ENCOUNTER — Emergency Department (HOSPITAL_COMMUNITY): Payer: MEDICAID

## 2023-11-28 DIAGNOSIS — N186 End stage renal disease: Secondary | ICD-10-CM | POA: Diagnosis not present

## 2023-11-28 DIAGNOSIS — Z992 Dependence on renal dialysis: Secondary | ICD-10-CM | POA: Insufficient documentation

## 2023-11-28 DIAGNOSIS — R079 Chest pain, unspecified: Secondary | ICD-10-CM | POA: Insufficient documentation

## 2023-11-28 DIAGNOSIS — Z79899 Other long term (current) drug therapy: Secondary | ICD-10-CM | POA: Diagnosis not present

## 2023-11-28 DIAGNOSIS — Z7901 Long term (current) use of anticoagulants: Secondary | ICD-10-CM | POA: Diagnosis not present

## 2023-11-28 DIAGNOSIS — R109 Unspecified abdominal pain: Secondary | ICD-10-CM | POA: Diagnosis present

## 2023-11-28 DIAGNOSIS — D649 Anemia, unspecified: Secondary | ICD-10-CM | POA: Diagnosis not present

## 2023-11-28 DIAGNOSIS — I120 Hypertensive chronic kidney disease with stage 5 chronic kidney disease or end stage renal disease: Secondary | ICD-10-CM | POA: Diagnosis not present

## 2023-11-28 LAB — CBC
HCT: 25.5 % — ABNORMAL LOW (ref 36.0–46.0)
Hemoglobin: 8.2 g/dL — ABNORMAL LOW (ref 12.0–15.0)
MCH: 29.9 pg (ref 26.0–34.0)
MCHC: 32.2 g/dL (ref 30.0–36.0)
MCV: 93.1 fL (ref 80.0–100.0)
Platelets: 145 K/uL — ABNORMAL LOW (ref 150–400)
RBC: 2.74 MIL/uL — ABNORMAL LOW (ref 3.87–5.11)
RDW: 18.9 % — ABNORMAL HIGH (ref 11.5–15.5)
WBC: 5.1 K/uL (ref 4.0–10.5)
nRBC: 0 % (ref 0.0–0.2)

## 2023-11-28 NOTE — ED Notes (Signed)
 Patient requesting pain medication and nausea meds, however noted to have multiple allergies preventing RN from utilizing standing orders. Patient informed that provider would be consulted for appropriate medications.

## 2023-11-28 NOTE — ED Triage Notes (Addendum)
 Pt came in via POV d/t n/v & generalized CP, plus bil flank & abd pain. More tender around her peritoneal catheter. Does have a stomach ulcer & is concerned it may be getting worse. A/Ox4, rates her pain 9/10 during triage. Pt reports she does not make urinae anymore.

## 2023-11-28 NOTE — ED Notes (Signed)
 RN contacted lab to verify additional lab work. Lab technician states lavender top was the only blood collected. RN informed triage RN

## 2023-11-28 NOTE — ED Notes (Signed)
 Stuck patient to obtain labs was unable to collect labs triage RN aware

## 2023-11-28 NOTE — ED Notes (Signed)
 Pt was a tough stick, US  IV started but would not pull back enough blood for all labs.

## 2023-11-28 NOTE — ED Notes (Signed)
 Pt requesting pain medication. Triage RN aware.

## 2023-11-29 ENCOUNTER — Emergency Department (HOSPITAL_COMMUNITY): Payer: MEDICAID

## 2023-11-29 LAB — TROPONIN I (HIGH SENSITIVITY): Troponin I (High Sensitivity): 16 ng/L (ref <18)

## 2023-11-29 LAB — COMPREHENSIVE METABOLIC PANEL WITH GFR
ALT: 19 U/L (ref 0–44)
AST: 21 U/L (ref 15–41)
Albumin: 2.9 g/dL — ABNORMAL LOW (ref 3.5–5.0)
Alkaline Phosphatase: 164 U/L — ABNORMAL HIGH (ref 38–126)
Anion gap: 27 — ABNORMAL HIGH (ref 5–15)
BUN: 76 mg/dL — ABNORMAL HIGH (ref 6–20)
CO2: 16 mmol/L — ABNORMAL LOW (ref 22–32)
Calcium: 8.3 mg/dL — ABNORMAL LOW (ref 8.9–10.3)
Chloride: 92 mmol/L — ABNORMAL LOW (ref 98–111)
Creatinine, Ser: 25.33 mg/dL — ABNORMAL HIGH (ref 0.44–1.00)
GFR, Estimated: 2 mL/min — ABNORMAL LOW (ref >60)
Glucose, Bld: 97 mg/dL (ref 70–99)
Potassium: 5.6 mmol/L — ABNORMAL HIGH (ref 3.5–5.1)
Sodium: 135 mmol/L (ref 135–145)
Total Bilirubin: 0.9 mg/dL (ref 0.0–1.2)
Total Protein: 6.5 g/dL (ref 6.5–8.1)

## 2023-11-29 LAB — BODY FLUID CELL COUNT WITH DIFFERENTIAL
Eos, Fluid: 33 %
Lymphs, Fluid: 7 %
Monocyte-Macrophage-Serous Fluid: 57 % (ref 50–90)
Neutrophil Count, Fluid: 1 % (ref 0–25)
Total Nucleated Cell Count, Fluid: 18 uL (ref 0–1000)

## 2023-11-29 LAB — GLUCOSE, PLEURAL OR PERITONEAL FLUID: Glucose, Fluid: 931 mg/dL

## 2023-11-29 LAB — CBG MONITORING, ED: Glucose-Capillary: 89 mg/dL (ref 70–99)

## 2023-11-29 LAB — LIPASE, BLOOD: Lipase: 41 U/L (ref 11–51)

## 2023-11-29 LAB — HCG, SERUM, QUALITATIVE: Preg, Serum: NEGATIVE

## 2023-11-29 MED ORDER — GENTAMICIN SULFATE 0.1 % EX CREA
1.0000 | TOPICAL_CREAM | Freq: Every day | CUTANEOUS | Status: DC
  Filled 2023-11-29: qty 15

## 2023-11-29 MED ORDER — DELFLEX-LC/1.5% DEXTROSE 344 MOSM/L IP SOLN: INTRAPERITONEAL | Status: DC

## 2023-11-29 MED ORDER — SODIUM CHLORIDE 0.9 % IV SOLN: INTRAVENOUS | Status: DC

## 2023-11-29 MED ORDER — MORPHINE SULFATE (PF) 4 MG/ML IV SOLN
6.0000 mg | Freq: Once | INTRAVENOUS | Status: AC
  Administered 2023-11-29: 6 mg via INTRAVENOUS
  Filled 2023-11-29 (×2): qty 2

## 2023-11-29 MED ORDER — SODIUM ZIRCONIUM CYCLOSILICATE 10 G PO PACK: 10.0000 g | PACK | Freq: Once | ORAL | Status: DC

## 2023-11-29 MED ORDER — PANTOPRAZOLE SODIUM 40 MG IV SOLR
40.0000 mg | Freq: Once | INTRAVENOUS | Status: AC
  Administered 2023-11-29: 40 mg via INTRAVENOUS
  Filled 2023-11-29 (×2): qty 10

## 2023-11-29 MED ORDER — SODIUM CHLORIDE 0.9 % IV SOLN
25.0000 mg | Freq: Four times a day (QID) | INTRAVENOUS | Status: DC | PRN
  Administered 2023-11-29: 25 mg via INTRAVENOUS
  Filled 2023-11-29: qty 1

## 2023-11-29 NOTE — ED Notes (Signed)
 Phenergan  ordered from pharmacy  dialysis has been called about the peritoneal exchange needed  waiting for the charge rn to return my call

## 2023-11-29 NOTE — ED Notes (Signed)
 Pt updated on next steps. Pt reassured staff haven't forgotten about her and working hard to get pt seen.

## 2023-11-29 NOTE — ED Notes (Signed)
 Pt approached nurse station inquiring about wait time. RN informed pt staff is working hard to get pt to the back.  Pt requested pain and nausea medication.  RN informed an EDP d/t pt extensive allergy list.

## 2023-11-29 NOTE — ED Notes (Signed)
 Back from CT

## 2023-11-29 NOTE — Discharge Instructions (Signed)
Please follow-up with your dialysis as scheduled.

## 2023-11-29 NOTE — ED Notes (Signed)
 Pt requesting pain medications. Pt aware of her allergies and unable to medicate at this time

## 2023-11-29 NOTE — ED Provider Notes (Signed)
 Patient care assumed as a handoff from prior provider. Case discussed in detail at signout including history, physical exam findings, diagnostic workup, and treatment course up to this point.  I have reviewed the chart, labs, imaging, and clinical course.   Please refer to initial ED note since the prior ED provider primarily managed this patient.  I am assuming care in the later phase of the ED visit.  I have reevaluated the patient to confirm clinical stability and plan of care.    Hx peritoneal dialysis Nephro - dialysis fluid send from nursing  F/u fluid and CT scan for abd pain  Physical Exam  BP (!) 134/91 (BP Location: Left Arm)   Pulse 74   Temp 98.3 F (36.8 C) (Oral)   Resp 16   Ht 5' 6 (1.676 m)   Wt 71.8 kg   SpO2 100%   BMI 25.55 kg/m   Physical Exam  Procedures  Procedures  ED Course / MDM    Medical Decision Making Amount and/or Complexity of Data Reviewed Labs: ordered. Radiology: ordered.  Risk Prescription drug management.   Patient CT scan was unremarkable for any acute abnormalities.  The fluid was drawn and no organisms were seen with normal amount of nucleated cells.  Pain has been controlled.  Nephrology was consulted and has a plan for hemodialysis.  Patient structured to follow-up for dialysis as scheduled.       Deona Novitski, DO 11/29/23 2335

## 2023-11-29 NOTE — ED Provider Notes (Signed)
 [facility] Provider Note   CSN: [id number] Arrival date & time: 11/28/23  1712     Patient presents with: Abdominal Pain, Nausea, Flank Pain, and Chest Pain   Evelyn Bryant is a 38 y.o. female.   38 year old female with history of ERSD who does peritoneal dialysis presents with abdominal pain going up to her chest.  Denies any associated cardiac symptoms.  Denies any fever.  States that she did notice some blood-tinged emesis.  Does have a history of gastric ulcer disease and is concerned that is getting worse.  Patient is anuric.  Her last session of peritoneal dialysis was about a day ago.  At that time, she noted no change to the color of her dialysate.  States that she also has a history of peritonitis and is unsure if this feels similar.  States that she gets this pain often.       Prior to Admission medications   Medication Sig Start Date End Date Taking? Authorizing Provider  amLODipine  (NORVASC ) 10 MG tablet Take 10 mg by mouth daily. 04/12/17   [provider]  apixaban  (ELIQUIS ) 5 MG TABS tablet Take 5 mg by mouth 2 (two) times daily. 11/23/22   [provider]  calcium  acetate (PHOSLO) 667 MG capsule Take 1,334 mg by mouth 3 (three) times daily with meals.    [provider]  furosemide (LASIX) 20 MG tablet Take 20 mg by mouth daily. For 30 days 10/20/22 10/20/23  [provider]  gabapentin (NEURONTIN) 100 MG capsule Take 100 mg by mouth at bedtime. 11/23/22 11/23/23  [provider]  pantoprazole  (PROTONIX ) 40 MG tablet Take 40 mg by mouth 2 (two) times daily. 03/26/22   [provider]  promethazine  (PHENERGAN ) 25 MG suppository Place 1 suppository (25 mg total) rectally every 6 (six) hours as needed for nausea or vomiting. 12/19/22   Tegeler, Lonni PARAS, MD  traMADol  (ULTRAM ) 50 MG tablet Take 1 tablet (50 mg total) by mouth every 6 (six) hours as needed. 12/19/22   Tegeler,  Lonni PARAS, MD    Allergies: Ace inhibitors, Acetaminophen, Codeine, Enalapril, Fentanyl, Hydrocodone, Oxycodone , Sulfa antibiotics, and Nsaids    Review of Systems  All other systems reviewed and are negative.   Updated Vital Signs BP (!) 142/90 (BP Location: Right Arm)   Pulse 70   Temp 98.2 F (36.8 C)   Resp 18   Ht 1.676 m (5' 6)   Wt 71.8 kg   SpO2 99%   BMI 25.55 kg/m   Physical Exam  (all labs ordered are listed, but only abnormal results are displayed) Labs Reviewed  CBC - Abnormal; Notable for the following components:      Result Value   RBC 2.74 (*)    Hemoglobin 8.2 (*)    HCT 25.5 (*)    RDW 18.9 (*)    Platelets 145 (*)    All other components within normal limits  BODY FLUID CULTURE W GRAM STAIN  GRAM STAIN  LIPASE, BLOOD  HCG, SERUM, QUALITATIVE  SYNOVIAL CELL COUNT + DIFF, W/ CRYSTALS  GLUCOSE, BODY FLUID OTHER            COMPREHENSIVE METABOLIC PANEL WITH GFR  I-STAT CHEM 8, ED  CBG MONITORING, ED  TROPONIN I (HIGH SENSITIVITY)  TROPONIN I (HIGH SENSITIVITY)    EKG: EKG Interpretation Date/Time:  Tuesday November 28 2023 17:23:17 EDT Ventricular Rate:  69 PR Interval:  220 QRS Duration:  92 QT Interval:  424 QTC Calculation: 454 R Axis:   42  Text Interpretation: Sinus rhythm with 1st degree A-V block Otherwise normal ECG When compared with ECG of 19-Dec-2022 02:27, No significant change was found Confirmed by Raford Lenis ([id number]) on 11/29/2023 12:09:45 AM  Radiology: DG Chest 2 View Result Date: 11/28/2023 CLINICAL DATA:  Chest pain with bilateral flank pain and abdominal pain. EXAM: CHEST - 2 VIEW COMPARISON:  November 08, 2022 FINDINGS: The heart size and mediastinal contours are within normal limits. Radiopaque vascular stents are seen within the medial aspect of the upper right lung and medial soft tissues of the proximal right upper extremity. Both lungs are clear. Stable curvilinear and linear opacities are seen overlying the left  upper quadrant. Radiopaque surgical clips are present within the right axilla. The visualized skeletal structures are unremarkable. IMPRESSION: No active cardiopulmonary disease. Electronically Signed   By: Suzen Dials M.D.   On: 11/28/2023 18:35     Procedures   Medications Ordered in the ED  pantoprazole  (PROTONIX ) injection 40 mg (has no administration in time range)  morphine  (PF) 4 MG/ML injection 6 mg (has no administration in time range)  promethazine  (PHENERGAN ) 25 mg in sodium chloride  0.9 % 50 mL IVPB (has no administration in time range)  0.9 %  sodium chloride  infusion (has no administration in time range)                                    Medical Decision Making Amount and/or Complexity of Data Reviewed Labs: ordered. Radiology: ordered.  Risk Prescription drug management.   Discussed with nephrology, Dr. Norine, she has given the okay to have patient's peritoneal fluid drained and sent for analysis.  Patient medicated here with Phenergan  as well as morphine .  Of note, patient did ask for hydromorphone .  Abdominal CT and labs are pending at this time.  Signed out to Dr. Jarold     Final diagnoses:  None    ED Discharge Orders     None          Dasie Faden, MD 11/29/23 [phone]

## 2023-11-29 NOTE — ED Notes (Signed)
 Patient escorted to room by sort tech, patient immediately asked to go to restroom. Patient ambulated to restroom independently with no distress noted and a steady gait.

## 2023-11-29 NOTE — ED Notes (Signed)
 Pt state that she is getting some fresh air

## 2023-11-29 NOTE — ED Notes (Signed)
 Pt refused vitals to be rechecked in [facility] [employer].

## 2023-11-29 NOTE — Progress Notes (Signed)
 Approx 1 hour and 15 minutes after the dwell of the PD solution ---drained approx 1000 of the 1500--no c/o of pain or discomfort---effluent was clear light amber--no sediment or fibrin seen---personally delivered the samples to the lab per protocol

## 2023-11-29 NOTE — ED Notes (Signed)
 RN informed pt at this time medication cannot be given in lobby d/t her extensive allergy list. Pt verbalized understanding.  RN i

## 2023-11-29 NOTE — Consult Note (Signed)
 [facility]  INPATIENT CONSULTATION  Reason for Consultation: abd pain in PD patient Requesting Provider: Dr. Corinthia  HPI: Evelyn Bryant is an 38 y.o. 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   Pt visiting [place] from [place] to be w family around anniversary of dad's death.  She follows with [facility] for PD which she's been on since mid 2025.  She was on hemodialysis previously for years.  She had a recent peritonitis treated last month and a recent f/u cell count was normal.    She presented to the ED yesterday with N/V/flank pain.  Denies any cloudy PD fluid, breaches with technique.  Her abd pain is mild and generalized, not similar to prior peritonitis.  She is awaiting CT abdomen and PD fluid sample.  She is tolerating some clear liquids in the ED.  K 5.6. BUN 76, WBC 5.1, Hb 8.2, Plt 145.  She's afebrile and normotensive.    She missed PD last night due to being in the ED but otherwise says she's been compliant with her CAPD - 4 cycles, 2.5L.  Mainly uses 2.5%.   PMH: Past Medical History:  Diagnosis Date   Kidney failure    Lupus    Superior vena cava syndrome    PSH: Past Surgical History:  Procedure Laterality Date   CARDIAC SURGERY     KIDNEY SURGERY      Past Medical History:  Diagnosis Date   Kidney failure    Lupus    Superior vena cava syndrome     Medications:  I have reviewed the patient's current medications.  (Not in a hospital admission)   ALLERGIES:   Allergies  Allergen Reactions   Ace Inhibitors Anaphylaxis   Acetaminophen Anaphylaxis   Codeine Anaphylaxis   Enalapril Anaphylaxis   Fentanyl Anaphylaxis   Hydrocodone Anaphylaxis   Oxycodone  Anaphylaxis   Sulfa Antibiotics Anaphylaxis   Nsaids      Not an actual allergy, pt cannot take as a dialysis pt    FAM HX: History reviewed. No pertinent family history.  Social History:   reports that she has never smoked. She has never used smokeless tobacco. She reports that she does not drink alcohol and does not use drugs.  ROS: 12 system ROS neg except per HPI above  Blood pressure 131/87, pulse 82, temperature 97.7 F (36.5 C), temperature source Oral, resp. rate 19, height 5' 6 (1.676 m), weight 71.8 kg, SpO2 100%. PHYSICAL EXAM: Gen: chronically ill but nontoxic appearing  Eyes: EOMI ENT: poor dentition, MMM Neck: dilated neck and chest veins which are chronic CV: RRR Abd:  PD catheter L abd, abd is fairly soft and minimally TTP, no rebound or guarding Back: clear lungs Extr:  no edema, multiple failed UE dialysis access Neuro: grossly nonfocal    Results for orders placed or performed during the hospital encounter of 11/28/23 (from the past 48 hours)  CBC     Status: Abnormal   Collection Time: 11/28/23  5:31 PM  Result Value Ref Range   WBC 5.1 4.0 - 10.5 K/uL   RBC 2.74 (L) 3.87 - 5.11 MIL/uL   Hemoglobin 8.2 (L) 12.0 - 15.0 g/dL   HCT 74.4 (L) 63.9 - 53.9 %   MCV 93.1 80.0 - 100.0 fL  MCH 29.9 26.0 - 34.0 pg   MCHC 32.2 30.0 - 36.0 g/dL   RDW 81.0 (H) 88.4 - 84.4 %   Platelets 145 (L) 150 - 400 K/uL   nRBC 0.0 0.0 - 0.2 %    Comment: Performed at [facility], [address] [place], [place] [address]  CBG monitoring, ED     Status: None   Collection Time: 11/29/23  2:46 PM  Result Value Ref Range   Glucose-Capillary 89 70 - 99 mg/dL    Comment: Glucose reference range applies only to samples taken after fasting for at least 8 hours.  Lipase, blood     Status: None   Collection Time: 11/29/23  3:03 PM  Result Value Ref Range   Lipase 41 11 - 51 U/L    Comment: Performed at [facility], [address], [place], [place] [address]  Troponin I (High Sensitivity)     Status: None   Collection  Time: 11/29/23  3:03 PM  Result Value Ref Range   Troponin I (High Sensitivity) 16 <18 ng/L    Comment: (NOTE) Elevated high sensitivity troponin I (hsTnI) values and significant  changes across serial measurements may suggest ACS but many other  chronic and acute conditions are known to elevate hsTnI results.  Refer to the Links section for chest pain algorithms and additional  guidance. Performed at [facility], [address], [place], [place] [address]   hCG, serum, qualitative     Status: None   Collection Time: 11/29/23  3:03 PM  Result Value Ref Range   Preg, Serum NEGATIVE NEGATIVE    Comment:        THE SENSITIVITY OF THIS METHODOLOGY IS >10 mIU/mL. Performed at [facility], [address] [place], [place] [address]   Comprehensive metabolic panel with GFR     Status: Abnormal   Collection Time: 11/29/23  3:03 PM  Result Value Ref Range   Sodium 135 135 - 145 mmol/L   Potassium 5.6 (H) 3.5 - 5.1 mmol/L    Comment: HEMOLYSIS AT THIS LEVEL MAY AFFECT RESULT   Chloride 92 (L) 98 - 111 mmol/L   CO2 16 (L) 22 - 32 mmol/L   Glucose, Bld 97 70 - 99 mg/dL    Comment: Glucose reference range applies only to samples taken after fasting for at least 8 hours.   BUN 76 (H) 6 - 20 mg/dL   Creatinine, Ser 74.66 (H) 0.44 - 1.00 mg/dL    Comment: RESULT CONFIRMED BY MANUAL DILUTION   Calcium  8.3 (L) 8.9 - 10.3 mg/dL   Total Protein 6.5 6.5 - 8.1 g/dL   Albumin 2.9 (L) 3.5 - 5.0 g/dL   AST 21 15 - 41 U/L    Comment: HEMOLYSIS AT THIS LEVEL MAY AFFECT RESULT   ALT 19 0 - 44 U/L    Comment: HEMOLYSIS AT THIS LEVEL MAY AFFECT RESULT   Alkaline Phosphatase 164 (H) 38 - 126 U/L   Total Bilirubin 0.9 0.0 - 1.2 mg/dL    Comment: HEMOLYSIS AT THIS LEVEL MAY AFFECT RESULT   GFR, Estimated 2 (L) >60 mL/min    Comment: (NOTE) Calculated using the CKD-EPI Creatinine Equation (2021)    Anion gap 27 (H) 5 - 15    Comment: ELECTROLYTES REPEATED TO VERIFY Performed at [facility], [address] [place], [place] [address]     DG Chest 2 View Result Date: 11/28/2023 CLINICAL  DATA:  Chest pain with bilateral flank pain and abdominal pain. EXAM: CHEST - 2 VIEW COMPARISON:  November 08, 2022 FINDINGS: The heart size and mediastinal contours are within normal limits. Radiopaque vascular stents are seen within the medial aspect of the upper right lung and medial soft tissues of the proximal right upper extremity. Both lungs are clear. Stable curvilinear and linear opacities are seen overlying the left upper quadrant. Radiopaque surgical clips are present within the right axilla. The visualized skeletal structures are unremarkable. IMPRESSION: No active cardiopulmonary disease. Electronically Signed   By: Suzen Dials M.D.   On: 11/28/2023 18:35    Assessment/PlanCrystal Cauthon is an 38 y.o. 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   **abdominal pain:  wide ddx currently pending imaging and PD fluid analysis.  Recent peritonitis but exam is fairly bland and doubt peritonitis esp with lack of fever and leukocytosis.  D/w dialysis RN who will collect PD fluid sample per protocol for cell count and culture - if c/w peritonitis would give broad spectrum IV abx tonight.   **ESRD on PD:  if requires admission (may not) will plan for PD here overnight if she gets a room.  Does CAPD but will convert to CCPD in hosp.  If she does discharge tonight she confirms she has supplies and will do PD tonight.   **Hyperkalemia:  gave lokelma  10g x 1 dose here.  PD tonight (here vs outpt) as well.   **Anemia;  Hb 8s, no indication for transfusion.  Consider ESA if remains inpt.   **BMM: cont outpt meds.   **HTN: BP normal currently.    Unclear if she'll  require admission - discussed this with her it's pending testing and clinical course.    Manuelita DELENA Barters 11/29/2023, 8:37 PM

## 2023-11-29 NOTE — ED Notes (Signed)
 I have spoken to the dialysis nurse and she reports that the order for an exchange for dialysis  from the nephrologist  before they can do the exchange;  dr dasie aware

## 2023-11-29 NOTE — ED Notes (Signed)
 Assumed care of patient.

## 2023-11-29 NOTE — Progress Notes (Signed)
 Approx 1500cc of 1.5% dextrose  PD solution instilled into this pts PD catheter per sterile technique Drain time was approx 10 to 15 minutes No pain or discomfort was assessed as well as she denied any such symptoms DSD change to this pts PD exit site---no reddness or drainage assessed---sterile dressing applied and secured To dwell 1 hour per order then samples to be taken---start time for this was 1830

## 2023-11-29 NOTE — ED Notes (Signed)
 Attempted to bring patient back for blood work. Patient refused. Triage RN aware

## 2023-11-30 LAB — PATHOLOGIST SMEAR REVIEW

## 2023-11-30 NOTE — ED Notes (Signed)
 Pt given bus pass upon discharge

## 2023-12-03 LAB — BODY FLUID CULTURE W GRAM STAIN: Culture: NO GROWTH

## 2024-01-25 NOTE — Progress Notes (Signed)
 "-------------------------------------------------------------------------------  Attestation signed by Lamar Dallas Busing, MD at 01/25/2024  1:57 PM  I have examined the patient and agree with the assessment and treatment plan as outlined in the APP note.    Interval events reviewed. She has undergone HD sessions 11/4 and 11/5 with good UF. No PD-related problems noted. She is stable and asleep at my visit.  -- Resuming CAPD for while in house as ordered.  -- Appreciate ID input for PD-associated peritonitis (initial cell count 119, improved on rechecks; PD fluid Cx 11/2-11/3 remain NGTD). As she is refusing IP antibiotics, plan for PO antibiotic regimen as below.  -- ESA ordered today for anemia.  -- Close follow-up with her home PD clinic after discharge.    Lamar Busing, M.D.  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 Most recently, was admitted to [facility] for diverticulitis     Interval Hx:  11/6: seen in room. She is eager to go home. Discussed recommendation of IP ABX in lieu of oral ABX again with pt, h/e she continues to decline despite education. Discussed need for prompt FU with HT clinic on DC- she verbalized understanding. Denies abd pain and reports PD went well this AM. Tells me she had BM this AM.     Allergies:  Allergies[1]    Medications:    apixaban   5 mg Oral BID    calcitrioL  0.5 mcg Oral Daily    calcium  acetate(phosphat bind)  1,334 mg Oral TID with meals    calcium  carbonate  1,000 mg Oral BID    epoetin  alfa-epbx  10,000 Units  Intravenous During dialysis    fluconazole  100 mg Oral Daily    HYDROmorphone   2 mg Oral Q8H    melatonin  3 mg Oral Nightly    [Held by provider] pantoprazole   40 mg Intravenous BID    perit. dialysis & dex 1.5 %  2 L Intraperitoneal 6x Daily    sodium zirconium cyclosilicate   10 g Oral TID    vancomycin  IV - no dose due monitor level  1 each Miscellaneous No dose due - monitor level       Review of Systems:    All other systems reviewed and were negative    Objective  Patient Vitals for the past 8 hrs:   BP Temp Temp src Pulse Resp SpO2   01/25/24 1107 111/62 98.5 F (36.9 C) Oral 80 18 97 %   01/25/24 0830 -- -- -- 81 -- 99 %   01/25/24 0818 -- -- -- -- 18 --   01/25/24 0735 126/66 98.3 F (36.8 C) Oral -- 18 --   01/25/24 0500 120/63 98.1 F (36.7 C) Oral 81 18 100 %     I/O this shift:  In: 100   Out: -     Physical Exam:     GENERAL : Apathetic female in no acute distress.  CV: Heart rhythm regular, normal S1 and S2.  No murmur.  No jugular venous distention.   RESP: Chest clear  to auscultation bilaterally. No respiratory distress, no rales, rhonchi, or wheezing    ABDOMEN: Soft, nontender, with normoactive bowel sounds.   EXTREMITIES:  warm and well perfused.  No peripheral edema.   MUSCULOSKELETAL: Normal muscle tone. Joints without inflammation.    NEURO: normal speech, no focal findings.    PSYCH: Mental status alert and fully oriented.  Access: RUE AVF + T/B, PD cath intact with a tegaderm drsg applied    Test Results    Results from last 7 days   Lab Units 01/24/24  0841 01/23/24  1457 01/22/24  1307 01/21/24  1400   SODIUM mmol/L 138 138 137 139   POTASSIUM mmol/L 3.9 4.2  4.2 5.2* 6.3*   CHLORIDE mmol/L 96* 96* 94* 94*   CO2 mmol/L 27 21 15* 17*   BUN mg/dL 44* 65* 91* 899*   CREATININE mg/dL 85.84* 82.91* 76.51* 71.42*   CALCIUM , TOTAL mg/dL 7.9* 8.4* 8.5* 8.1*   ALT IU/L 8 8 6* 8   AST IU/L 13 12* 10* 11*       Results from last 7 days   Lab Units 01/24/24  0841 01/23/24  1457  01/21/24  1400   WBC K/uL 2.7* 2.5* 3.4*   RBC M/uL 2.37* 2.50* 2.81*   HEMOGLOBIN g/dL 7.0* 7.6* 8.3*   HEMATOCRIT % 21* 22* 26*   MEAN CELL VOLUME fL 90 89 92   MEAN CELL HEMOGLOBIN pg 29 30 30    MEAN CELL HEMOGLOBIN CONCENTRATION g/dL 33 34 32*   RDW % 84.2 16.1 16.4   PLATELET COUNT K/uL 142* 154 204   MEAN PLATELET VOLUME fL 7.4* 7.0* 7.5         Assessment/Recommendations:  Active Problems:    SVC syndrome    Anemia due to chronic kidney disease, on chronic dialysis (CMS/HCC v28)    History of lupus nephritis    ESRD on peritoneal dialysis (CMS/HCC v28)    Colitis    Peritonitis associated with peritoneal dialysis      ESRD/PD  PD [facility] home therapy  Other access: RUA AVF +T/B  PD placement 08/18/23 and noted to be an average high transporter  Hyperkalemic upon arrive to ER  Lokelma  started and potassium lowering  meds of dextrose  and insulin   PD fluid studies sent and was positive peritonitis 11/2  Cefepime and Vanc IV was started will change to IP since no IV access  PD fluid studies done 11/3 shows improvement of WBCs and Neutrophils  Nystatin started for fungal prophylaxis  PD orders written for CAPD 6x with 1.5% solution  Primary team is considering PICC line placement and risks are discussed with patient about this option. Pt is not wanting to do IP abx which is the best recommended practice for discharge.   Pt has had several bouts of peritonitis in the last 4 months, along with hyperkalemia and increased creatinine, which may be indicative of going back to hemodialysis for better clearance.  Pt is agreeable to do some sessions of HD to improve electrolyte imbalance.   HD 11/4 & 11/5 and will reevaluate PD verus HD. Will get labs on HD.  HD 01/23/24 with UF 2.5L, SBP 138  HD 01/24/24 with UF goal 3L, SBP 105  K trending down now, hold Lokelma  (11/6)  Pt resuming PD today 11/6. She is still refusing IP antibiotics which is the optimal way to treat peritonitis. ID consulted. If decides to go with PD  and refuses IP Vanc then could  consider Linezolid and possible quinolone but standard of care would be IP antibiotics.   Tentative outpt oral ABX plan:  linezolid 600 mg PO BID and cipro 500 mg PO daily for a total of 2 weeks. Also fluconazole 100 mg QD x 2 weeks     HTN  Continue home meds  UF with PD/HD    Anemia of CKD  Hgb below goal  Dose of Epogen  given in HD 11/5  Start ESA SQ 11/6  GI consulted, no plan for inpt colonoscopy  Trend H&H, monitor for signs of decline/overt GI loss  Primary team to evaluate for transfusion orders  No IV iron  in setting infection     Secondary hyperparathyroidism  CoCa stable  Phos elevated  Tums BID  Phoslo  for phos binder  Renal diet     Dispo:Renal: Tentative discharge today 11/6. Follow up with PD clinic after discharge. Fax # [phone]. Pt will need follow up for peritonitis OP for abx- d/w pt again today                                         [1]  Allergies  Allergen Reactions    Ace Inhibitors Anaphylaxis    Acetaminophen Anaphylaxis    Enalapril Maleate Anaphylaxis    Hydrocodone Anaphylaxis     Pt has had morphine  in the past, states it makes her nauseous    Oxycodone Anaphylaxis    Oxycodone-Acetaminophen Anaphylaxis    Sulfa (Sulfonamide Antibiotics) Anaphylaxis and Other (See Comments)     Other reaction(s): Anaphylaxis    Sulfamethoxazole-Trimethoprim Anaphylaxis    Tylenol-Codeine Solution Anaphylaxis    Nsaids (Non-Steroidal Anti-Inflammatory Drug) Rash     Lupus patient; states can take phenergan   Not an actual allergy, pt cannot take as a dialysis pt   "

## 2024-02-29 NOTE — Consults (Signed)
 "-------------------------------------------------------------------------------  Attestation signed by Fairy Ceo, MD at 02/29/2024  3:11 PM  Pt personally seen and examined  at bedside and still reports significant abdominal pain from hemorrhagic renal cyst and Urology recommends conservative management  and IR not able to embolize due to prior embolization at the same arterial distribution    We will plan HD later today 12/11 to maintain outpatient TThS schedule    Agree with APP Note.    -------------------------------------------------------------------------------            [place]  [facility] Consult Note    Primary Care Provider:  [facility]    Requesting physician:  Hospitalist     Reason for Consult:  ESRD/ HD management     CC:  abdl pain/ n/v/ diarrhea     HPI:   38 y/o AAF with ESRD, prev PD, on HD at this time. HD in [place]/ [place]/ banned from [place] area. Recurrent adm between [name] and [facility]. Hospitalized at [facility] 11/2 for hyperK, hospitalized at [name]  11/6 for uremia. Poor compliance by hx.     She is here this time for n/v/d, abdl pain. Hx of gastric ulcers/ pancreatitis, diverticulitis. She is on Eliquis  for SVC Syndrome/ thrombus since 2020.  CT abd showed poss interval hemorrhagic cysts. She did drop her Hgb from 11.1--> 9.1 overnight. IR consulted for embolization, IR will not intervene/ deferred to Uro, saw, no intervention as no active extravasation on CT and hemodynamically stable.     We are following ESRD/ HD. Seen in the room, watching videos on her phone with no eye contact during interview and exam. States  I'm pissed and I need to be transferred to [facility].  Denies any SOB, on RA. Reports she flushes her PD weekly at home/ declined PD cath exam. Last HD was Tues in [place].  Appears stable/ comfortable when seen.  I offered HD today, so far in agreement.         Past Medical History:  Past Medical History[1]  Past Surgical History[2]    Medications:  Current  Medications[3]    Allergies:  Allergies[4]    Social History:  Social History[5]    Family History:  Family History[6]    Review of Systems:    All other systems were reviewed and found to be negative except as noted above.        Physical Examination:    BP 147/66 (BP Location: Right lower leg, BP Position: Supine, Cuff Size: Large Adult ([place]))   Pulse 78   Temp 98.1 F (36.7 C) (Oral)   Resp 16   Wt 77.9 kg (171 lb 11.8 oz)   SpO2 100%   BMI 27.72 kg/m          Intake/Output:  No intake/output data recorded.  I/O this shift:  In: 210 [P.O.:180; I.V.:5; IV Piggyback:25]  Out: -      Body surface area is 1.9 meters squared.  Weight change:        General: NAD, AAOx3  HEENT: op clear, eomi, NC/AT  Neck:  neck supple, no JVD  Heart: s1s2, normal apical impulse  Lungs: CTA B, normal effort  Abdomen: Soft, NTTP, +BS  Extremities: atraumatic, no edema  Skin: warm and dry.  No rashes  Access: R AVF with mod size PSA, good B/ T, no s/s of steal. She has PD cath/ refused exam.       Lab (most recent):   Results from last 7 days   Lab Units  02/29/24  1053 02/29/24  0119 02/28/24  1815   WBC K/uL  --   --  3.5*   HEMOGLOBIN g/dL 9.2* 9.1* 88.8   HEMATOCRIT % 29* 29* 34   PLATELET COUNT K/uL  --   --  234       Results from last 7 days   Lab Units 02/28/24  1815   SODIUM mmol/L 142   POTASSIUM mmol/L 4.3   CHLORIDE mmol/L 96*   CO2 mmol/L 27   BUN mg/dL 29*   CREATININE mg/dL 4.06*   ALBUMIN  g/dL 4.1   CALCIUM , TOTAL mg/dL 7.9*           Hospital Problems:  Anemia  Principal Problem:    Anemia      Assessment /Recommendations:     1.ESRD/ HD  --TTHS HD In [place]  --plan HD today 12/11 via R AVF  --can cont weekly PD flush at home     2.Abdl pain, n/v  --per primary team   --appeared comfortable when seen, benign abdl exam   --refused PD cath exam   --CT as above/ interval cyst hemorrhage   --PPI     3. Anemia   --from CKD   --acute loss, she had internal hemorrhagic renal cyst hemorrhage from CT  --Prev IR  embolization  11/19  at OSH   --IR here will not embolize. Deferred to Uro   --Uro plans no intervention. Need to follow with [facility] uro   --cont to monitor, serial check steady so far   --No Heparin with HD  --? May be able to stop Eliquis / SVC thrombus from 2020   --ESA with HD     4. HTN   --improving since admit   --UF with HD  --not on any antiHTN here yet     5. MBD  --Calcitriol  --Phoslo    --TUMS          Thank you for the opportunity to participate in the care of your patient.  We will continue to follow with you.            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    187 09/11/23 per CE outpt scanned    Hypertension     Hypoglycemia 04/22/2020    Lupus     Renal disorder     Renal hemorrhage, left 07/03/2022    Superior vena cava syndrome     VSD (ventricular septal defect and aortic arch hypoplasia    [2]  Past Surgical History:  Procedure Laterality Date    CENTRAL VENOUS CATHETER INSERTION  12/15/2014    CENTRAL VENOUS CATHETER INSERTION  12/16/2014    CENTRAL VENOUS CATHETER INSERTION  05/17/2018    CENTRAL VENOUS CATHETER REMOVAL  12/13/2014    CENTRAL VENOUS CATHETER REMOVAL  06/26/2018    hematoma removal      INTERVENTIONAL RADIOLOGY VASCULAR  04/25/2019    PR EGD TRANSORAL BIOPSY SINGLE/MULTIPLE N/A 10/05/2023    Procedure: ESOPHAGOGASTRODUODENOSCOPY W/ BIOPSY;  Surgeon: Garrie Deward Stank, MD;  Location: [facility] ENDO;  Service: Gastroenterology    removal chest port     [3]  Current Facility-Administered Medications   Medication Dose Route Frequency Provider Last Rate Last Admin    albuterol  (VENTOLIN  HFA, PROVENTIL  HFA) inhaler 4 puff  4 puff Inhalation Q6H PRN Giuseppina Andrawis Jacob, DO        [  Held by provider] apixaban  (ELIQUIS ) 5 mg tablet 5 mg  5 mg Oral BID Giuseppina Andrawis Jacob, DO        calcitrioL (ROCALTROL) capsule 0.5 mcg  0.5 mcg Oral Daily Giuseppina Andrawis Jacob, DO    0.5 mcg at 02/29/24 9075    calcium  acetate(phosphat bind) (PHOSLO ) capsule 1,334 mg  1,334 mg Oral TID with meals Giuseppina Andrawis Jacob, DO        calcium  carbonate (TUMS) chewable tablet 1,000 mg  1,000 mg Oral BID Giuseppina Andrawis Jacob, DO   1,000 mg at 02/29/24 9075    HYDROmorphone  (DILAUDID ) tablet 1 mg  1 mg Oral Q6H PRN Giuseppina Andrawis Jacob, DO   1 mg at 02/29/24 9261    ondansetron  (ZOFRAN  ODT) disintegrating tablet 4 mg  4 mg Oral Q6H PRN Giuseppina Andrawis Jacob, DO   4 mg at 02/29/24 0738    pantoprazole  (PROTONIX ) DR tablet 40 mg  40 mg Oral Daily Giuseppina Andrawis Jacob, DO   40 mg at 02/29/24 9075    promethazine  (PHENERGAN ) IVPB 6.25 mg in 25 mL NS (premix)  6.25 mg Intravenous Q6H PRN Giuseppina Andrawis Jacob, DO   Stopped at 02/29/24 1144    sodium chloride  0.9% (NS) bolus 250 mL  250 mL Intravenous PRN Mitchell Cecily Lenis, NP         Current Outpatient Medications   Medication Sig Dispense Refill    albuterol  (VENTOLIN  HFA, PROVENTIL  HFA) inhaler Inhale 4 puffs every 6 (six) hours as needed for shortness of breath. 8.5 g 0    apixaban  (ELIQUIS ) 5 mg Tab tablet Take 1 tablet (5 mg total) by mouth 2 (two) times a day. 60 tablet 2    calcitrioL (ROCALTROL) 0.5 MCG capsule Take 1 capsule (0.5 mcg total) by mouth daily for 60 days. 30 capsule 1    calcium  acetate,phosphat bind, (PHOSLO ) 667 mg capsule Take 2 capsules (1,334 mg total) by mouth 3 (three) times a day with meals for 60 days. 180 capsule 1    calcium  carbonate (TUMS) 200 mg calcium  (500 mg) chewable tablet Chew 2 tablets (1,000 mg total) 2 (two) times a day.      epoetin  alfa-epbx (RETACRIT ) 10,000 unit/mL injection Infuse 1 mL (10,000 Units total) into a venous catheter During dialysis.      pantoprazole  (PROTONIX ) 40 MG tablet Take 1 tablet (40 mg total) by mouth 2 (two) times a day for 60 days. (Patient taking differently: Take 1 tablet (40 mg total) by mouth daily.) 60 tablet 1    promethazine  (PHENERGAN )  25 MG tablet Take 1 tablet by mouth every 6 hours as needed for nausea or vomiting (First line) for up to 20 doses. 20 tablet 0   [4]  Allergies  Allergen Reactions    Ace Inhibitors Anaphylaxis    Acetaminophen Anaphylaxis    Enalapril Maleate Anaphylaxis    Hydrocodone Anaphylaxis     Pt has had morphine  in the past, states it makes her nauseous    Oxycodone Anaphylaxis    Oxycodone-Acetaminophen Anaphylaxis    Sulfa (Sulfonamide Antibiotics) Anaphylaxis and Other (See Comments)     Other reaction(s): Anaphylaxis    Sulfamethoxazole-Trimethoprim Anaphylaxis    Tylenol-Codeine Solution Anaphylaxis    Nsaids (Non-Steroidal Anti-Inflammatory Drug) Rash     Lupus patient; states can take phenergan   Not an actual allergy, pt cannot take as a dialysis pt   [5]  Social History  Socioeconomic History    Marital status: Single   Tobacco Use  Smoking status: Never    Smokeless tobacco: Never   Vaping Use    Vaping status: Never Used   Substance and Sexual Activity    Alcohol use: No    Drug use: No    Sexual activity: Not Currently     Birth control/protection: Abstinence     Social Drivers of Health     Tobacco Use: Low Risk (01/21/2024)    Patient History     Smoking Tobacco Use: Never     Smokeless Tobacco Use: Never   Alcohol Use: Not At Risk (12/13/2022)    AUDIT-C     Frequency of Alcohol Consumption: Never     Average Number of Drinks: Patient does not drink     Frequency of Binge Drinking: Never   Food Insecurity: Low Risk (12/14/2023)    Food Insecurity     Within the past 12 months, did the food you bought just not last and you didn't have money to get more?: No     Within the past 12 months, did you worry that your food would run out before you got money to buy more?: No   Transportation Needs: Low Risk (12/14/2023)    Transportation Needs     Lack of transportation: No   Housing Stability: Low Risk (12/14/2023)    Housing Stability     Unstable housing in the last year: No     Worried  about losing housing: No   Immediate Need: Low Risk (12/13/2022)    Immediate Need     Are any of your needs urgent? For example, you don't have food for tonight.: No     Would you like help with any of the needs youve identified?: No   Utilities: Low Risk (12/14/2023)    Utilities     Within the past 12 months, have you been unable to get utilities (heat, electricity) when it was really needed?: No   [6]  Family History  Problem Relation Age of Onset    Diabetes Mother     Diabetes Father    "

## 2024-03-02 NOTE — Discharge Summary (Addendum)
 "      Discharge Summary    Date of Admission: 02/28/2024 Name:  Meredith Hawkins   Date of Discharge:  03/02/2024 DOB:  Aug 26, 1985  Discharging Team:  Hospitalist Medicine MRN:  [id number]  Consultants:  Nephrology, Urology  Primary Care Physician: [facility]    Discharge Diagnoses:     Anemia          Discharge Medications        .      calcium  acetate(phosphat bind) 667 mg capsule  Take 2 capsules (1,334 mg total) by mouth 3 (three) times a day with meals for 60 days.  Commonly known as: PHOSLO      calcium  carbonate 200 mg calcium  (500 mg) chewable tablet  Chew 2 tablets (1,000 mg total) 2 (two) times a day.  Commonly known as: TUMS     Eliquis  5 mg Tab tablet  Take 1 tablet (5 mg total) by mouth 2 (two) times a day.  Generic drug: apixaban      epoetin  alfa-epbx 10,000 unit/mL injection  Infuse 1 mL (10,000 Units total) into a venous catheter During dialysis.  Commonly known as: RETACRIT      HYDROmorphone  2 MG tablet  Take 1 tablet by mouth every 8 hours as needed for moderate pain or severe pain  Commonly known as: DILAUDID      promethazine  12.5 MG tablet  Take 1 tablet (12.5 mg total) by mouth every 8 (eight) hours as needed for nausea or vomiting for up to 5 days.  Commonly known as: PHENERGAN             Other Discharge Instructions:   Disposition:  Home  Follow Up:  Primary Care in 1 week.   Activity:  As tolerated  Diet:  Regular    Results:    Last Weight:  77.8 kg (171 lb 8.3 oz)  Lab Results   Component Value Date    WBC 3.2 (L) 03/01/2024    HGB 9.6 (L) 03/01/2024    HCT 29 (L) 03/01/2024    PLT 180 03/01/2024    NA 140 03/01/2024    K 5.0 03/01/2024    CL 98 03/01/2024    CO2 32 (H) 03/01/2024    BUN 36 (H) 03/01/2024    CREAT 5.87 (H) 03/01/2024    GLU 75 03/01/2024    CA 8.5 (L) 03/01/2024    ALB 4.1 02/28/2024    TP 7.5 02/28/2024    ALT 22 02/28/2024    AST 31 02/28/2024    BILIT 0.3 02/28/2024    LIPAS 49 02/28/2024     Unresulted Labs       None          Radiology:  US  Renal  Bilateral  Result Date: 02/29/2024  IMPRESSION: 1.  Complex right upper pole cyst measuring 5 cm without vascularity. This could reflect a hemorrhagic cyst. Recommend follow-up ultrasound in 6 months to ensure stability. 2.  Echogenic renal parenchyma compatible with medical renal disease. 3.  Echogenic splenic lesion measuring 1.6 cm could reflect a hemangioma.    CT Abdomen and Pelvis With IV Contrast  Result Date: 02/28/2024  IMPRESSION: 1.  Increased size of right superior pole renal lesion measuring 5.7 cm, previously 3.9 cm. This could represent interval intracystic hemorrhage. Recommend further evaluation with renal ultrasound. 2.  Skin thickening and subcutaneous stranding in the visualized portions of the right breast. Recommend correlation with physical exam and mammography as warranted.    CT Abdomen and  Pelvis With IV Contrast  Result Date: 02/21/2024  Interval embolization of the complex right upper pole cystic mass, which has slightly decreased in size when compared to the pre-embolization exam from 02/05/2024. No definite new or increasing cysts bilaterally or new areas of hemorrhage. Again, a non-nonemergent MRI of the abdomen with and without contrast for better characterization of the cyst to exclude an underlying mass. Sequelae of chronic kidney disease with similar volume of abdominal ascites and anasarca. FOLLOW-UP RECOMMENDATION: Item for Follow Up: 1. Acuity: Non-urgent 2. Modality: MR 3. Anatomy: Abdomen 4. TimeFrame: 1 Month    IR Embolization - Hemorrhage Art Or Ven  Lymphatic Extravasation  Result Date: 02/07/2024  Right renal arteriogram was performed with abnormal contrast blush from the superior segmental branch supplying the hemorrhagic cyst which was successfully coil embolized. Signed (Electronic Signature): 02/07/2024 3:18 PM Signed By: Carlin Essex, MD    CT Abdomen and Pelvis With IV Contrast  Result Date: 02/05/2024  1.    Large complex cystic mass in the superior pole the  right kidney, favored to represent interval hemorrhage into a prior simple cyst. There is hyperdense fluid lateral to this cystic mass concerning for rupture of the cyst with extension of the hemorrhage laterally. However, an underlying mass cannot be excluded, and therefore a nonemergent MRI of the kidneys is recommended. 2.    Fluid within the rectum and descending colon, consistent with history of diarrhea. 3.    Colonic diverticulosis without evidence of acute diverticulitis. Signed (Electronic Signature): 02/05/2024 11:54 AM Signed By: Toribio Salt, MD    History of Present Illness:  Please see admission note for full details regarding the patient's presentation.  In brief, this 38 y.o. 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     Hospital Course:       Meredith Hawkins, a 38 year old female with end-stage renal disease on hemodialysis, lupus nephritis, hypertension, history of hemorrhagic renal cysts, superior vena cava syndrome, and recent admissions for diverticulitis, was admitted on 02/28/2024 with several days of abdominal pain, nausea, vomiting, and diarrhea.    On admission, she was found to have a drop in hemoglobin from 11.1 to 9.1 g/dL (believed to be her baseline), with imaging (CT abdomen/pelvis and renal ultrasound) revealing interval enlargement of the right superior pole renal cyst (5.7 cm on CT, 5 cm on ultrasound) without evidence of active extravasation or perinephric hematoma. Urology and interventional radiology were consulted; both recommended conservative management due to prior embolization and lack of access for repeat intervention, with serial hemoglobin monitoring and strict bedrest. Apixaban  was held during the period of active bleeding risk  and subsequently resumed when hemoglobin stabilized.    She received supportive care for anemia, including monitoring and pain control, and underwent scheduled hemodialysis sessions without heparin. Her hemoglobin remained stable after the initial drop, and no further acute intervention was required. Nausea and vomiting were managed with antiemetics, and pain was controlled with oral hydromorphone , with dose adjustments as needed. Her gastrointestinal symptoms improved, and she tolerated oral intake.    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 She was noted to have intermittent right breast swelling related  to her SVC syndrome, which decreased in size during admission and did not require intervention. She remained hemodynamically stable throughout, with no evidence of infection or acute diverticulitis.    At the time of discharge planning, her anemia was stable, pain was still present, however controlled on PO Dilauded and she was scheduled to continue outpatient hemodialysis and follow up with urology at [facility] for ongoing management of her renal cyst. She will be discharged on a few days of PO Dilauded and Promethazine  for PRN Nausea. It was communicate to her that we will only be providing 5 days and that she will need to see her primary care / pain specialist for long term pain management. She was discharged 03/02/2024 shortly after HD.     The patient was instructed on indications for seeking medical attention.  After physical examination and a review of our findings and recommendations, the patient was cleared for discharge.  I spent more than 30 minutes in the discharge of this patient.    Rexford R Fisher III, PA-C    Portions of this record may have been dictated using voice recognition software.  Unintentional  errors in spelling and vocabulary are possible and may sometimes remain uncorrected.    "

## 2024-03-04 NOTE — ED Provider Notes (Signed)
 "      [name] DEPART[facility] ENCOUNTER      CHIEF COMPLAINT    Chief Complaint   Patient presents with    Medical Problem       HPI    Per triage:  BIB EMS from home after patient became dizzy when making a snack and fell to the floor. Patient endorsing bilateral flank pain but right worse than left, right leg pain and right arm pain. Patient also states that she has a stomach ulcer.    Per EMS runsheet  Right arm pain Narrative    H- Patient is a 38 yo female who states she was making herself a midnight meal tonight when she felt lightheaded. The patient denies LOC but confirms falling to the ground. The patient waited two hours and then called 911. The patient does take blood thinners ( Eliquis ) . Upon Medic 70 arrival found patient walking out to ambulance.    A- See Assessments. The patient states her right side hurts, mainly her right arm, flank, and leg. No injuries, deformations, or otherwise any marks or sign of injury found.    History of Present Illness  Meredith Hawkins is a 38 year old female who presents with right leg, right arm, and bilateral flank pain after a fall.    She experienced a fall after becoming dizzy while cooking in the kitchen. She did not lose consciousness but landed on her side, resulting in pain in her right leg and arm. Felt like her leg twisted under her.  She has been able to ambulate since.    She has a history of a kidney embolism in the right flank, which has been causing pain. She has been seen at [facility] for recently for the same as below. The flank pain was present prior to the fall, but the arm and leg pain began after the incident.    She recently switched back to hemodialysis due to recurrent peritonitis and diverticulitis infections, and she received dialysis on Saturday. She has ongoing issues with nausea and vomiting, which contributed to her dizziness while cooking.    Per admission 12/10-12/13 at [facility]  38 year old female with ESRD on  hemodialysis, lupus nephritis, hypertension, history of hemorrhagic renal cysts, SVC syndrome, and recent diverticulitis admissions was hospitalized 12/10-12/13/2025 for several days of abdominal pain, nausea, vomiting, and diarrhea with associated anemia. Imaging demonstrated interval enlargement of a right superior pole renal cyst without active bleeding, and after consultation with Urology and Interventional Radiology, she was managed conservatively with bedrest and serial hemoglobin monitoring; apixaban  was temporarily held and later resumed once hemoglobin stabilized. She underwent scheduled heparin-free hemodialysis, with stable hemoglobin after initial drop, improvement of GI symptoms with supportive care, and adequate pain control on oral hydromorphone . Secondary issues including CKD-MBD and SVC syndrome were managed medically without complication. She was discharged in stable condition after HD with short-course PO hydromorphone  and antiemetics, outpatient HD arranged, and urology follow-up at [facility] for ongoing renal cyst management.     Seen at [facility] 12/3 for abdominal/flank pain.  CT as above.  VIR consulted and no intervention warranted.  Cdiff negative.  Discharged.     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 Imaging revealed a hemorrhagic right renal cyst with acute hemoglobin drop (7.0 ? 5.5), successfully treated with IR coil embolization; suspected GI bleeding was  not confirmed. PD-associated peritonitis was ruled out, and she transitioned from PD to intermittent HD with outpatient HD arranged, while retaining her PD catheter. GI symptoms improved with supportive care, and SVC syndrome symptoms were managed conservatively with anticoagulation resumed after stabilization.    CT A/P 12/10  1.  Increased size of right superior pole renal lesion measuring 5.7 cm,  previously 3.9 cm. This could represent interval intracystic hemorrhage. Recommend further evaluation with renal ultrasound.   2.  Skin thickening and subcutaneous stranding in the visualized portions of the right breast. Recommend correlation with physical exam and mammography as warranted.     US  Renal Complete 12/11  1.  Complex right upper pole cyst measuring 5 cm without vascularity. This could reflect a hemorrhagic cyst. Recommend follow-up ultrasound in 6 months to ensure stability.   2.  Echogenic renal parenchyma compatible with medical renal disease.   3.  Echogenic splenic lesion measuring 1.6 cm could reflect a hemangioma.     CT A/P with IV contrast 12/3  Interval embolization of the complex right upper pole cystic mass, which has slightly decreased in size when compared to the pre-embolization exam from 02/05/2024. No definite new or increasing cysts bilaterally or new areas of hemorrhage. Again, a non-nonemergent MRI of the abdomen with and without contrast for better characterization of the cyst to exclude an underlying mass.      Sequelae of chronic kidney disease with similar volume of abdominal ascites and anasarca.             PCP: Kennyth Kiki KATHEE Mickey., AGNP      REVIEW OF SYSTEMS    A complaint focused review of systems was discussed with the patient.  Pertinent positive and/or negative findings as noted in HPI.    PAST MEDICAL HISTORY    Past Medical History:  Diagnosis Date    Asthma (HHS-HCC)     ESRD on dialysis (CMS-HCC)     GERD (gastroesophageal reflux disease)     Hypertension     Lupus     Lupus nephritis (CMS-HCC)     Superior vena cava syndrome     Ventricular septal defect (VSD) (HHS-HCC)        SURGICAL HISTORY    Past Surgical History[1]    CURRENT MEDICATIONS    No current facility-administered medications for this encounter.    Current Outpatient Medications:     amlodipine  (NORVASC ) 10 MG tablet, Take 1 tablet (10 mg total) by mouth daily., Disp: 100 tablet, Rfl: 3     apixaban  (ELIQUIS ) 5 mg Tab, Take 1 tablet (5 mg total) by mouth two (2) times a day., Disp: , Rfl:     HYDROmorphone  (DILAUDID ) 2 MG tablet, Take 1 tablet (2 mg total) by mouth every eight (8) hours as needed for severe pain or moderate pain., Disp: , Rfl:     promethazine  (PHENERGAN ) 12.5 MG tablet, Take 1 tablet (12.5 mg total) by mouth every eight (8) hours as needed for nausea., Disp: , Rfl:     albuterol  HFA 90 mcg/actuation inhaler, Inhale 4 puffs every six (6) hours as needed for wheezing or shortness of breath. (Patient not taking: Reported on 03/04/2024), Disp: , Rfl:     calcitriol (ROCALTROL) 0.5 MCG capsule, Take 2 capsules (1 mcg total) by mouth daily. (Patient not taking: Reported on 03/04/2024), Disp: , Rfl:     calcium  acetate (PHOSLO ) 667 mg capsule, Take 2 capsules (1,334 mg total) by mouth Three (3) times a day  with a meal., Disp: , Rfl:     calcium  carbonate (TUMS) 200 mg calcium  (500 mg) chewable tablet, Chew 2 tablets (1,000 mg total) two (2) times a day., Disp: , Rfl:     pantoprazole  (PROTONIX ) 40 MG tablet, Take 1 tablet (40 mg total) by mouth Two (2) times a day (30 minutes before a meal). (Patient not taking: Reported on 03/04/2024), Disp: , Rfl:     ALLERGIES    Allergies[2]    FAMILY HISTORY    Family History[3]    SOCIAL HISTORY    Social History[4]    PHYSICAL EXAM    VITAL SIGNS:   ED Triage Vitals [03/04/24 0253]   Enc Vitals Group      BP 150/92      Pulse 93      SpO2 Pulse       Resp 17      Temp 36.5 C (97.7 F)      Temp Source Temporal      SpO2 100 %      Weight 71.7 kg (158 lb)      Height 1.676 m (5' 6)      Head Circumference       Peak Flow       Pain Score       Pain Loc       Pain Education       Exclude from Growth Chart            Physical Exam  Vitals and nursing note reviewed.   Constitutional:       Appearance: Normal appearance.   HENT:      Head: Normocephalic and atraumatic.      Nose: Nose normal.      Mouth/Throat:      Mouth: Mucous membranes are  moist.      Pharynx: Oropharynx is clear.   Eyes:      Extraocular Movements: Extraocular movements intact.      Pupils: Pupils are equal, round, and reactive to light.   Cardiovascular:      Rate and Rhythm: Normal rate.      Pulses: Normal pulses.   Pulmonary:      Effort: Pulmonary effort is normal.      Breath sounds: No wheezing, rhonchi or rales.   Abdominal:      General: Bowel sounds are normal. There is no distension.      Palpations: Abdomen is soft.      Tenderness: There is abdominal tenderness (mild diffuse). There is no guarding.      Comments: PD catheter in place   Musculoskeletal:         General: Normal range of motion.      Cervical back: Normal range of motion.      Comments: Full ROM throughout all extremities    Able to ambulate and bear weight without difficulty     RUE AVF with bruit and thrill    Skin:     General: Skin is warm and dry.      Capillary Refill: Capillary refill takes less than 2 seconds.   Neurological:      General: No focal deficit present.      Mental Status: She is alert and oriented to person, place, and time.         PROCEDURES      EKG Interpretation    Encounter Date: 03/04/24   ECG 12 Lead   Result Value    EKG Systolic  BP     EKG Diastolic BP     EKG Ventricular Rate 92    EKG Atrial Rate 92    EKG P-R Interval 202    EKG QRS Duration 86    EKG Q-T Interval 388    EKG QTC Calculation 479    EKG Calculated P Axis 58    EKG Calculated R Axis 81    EKG Calculated T Axis 68    QTC Fredericia 447    Narrative    NORMAL SINUS RHYTHM  NORMAL ECG  WHEN COMPARED WITH ECG OF 21-Feb-2024 14:39,  T WAVE INVERSION NO LONGER EVIDENT IN INFERIOR LEADS  NONSPECIFIC T WAVE ABNORMALITY NO LONGER EVIDENT IN ANTERIOR LEADS  T WAVE AMPLITUDE HAS DECREASED IN LATERAL LEADS  Confirmed by Jeannetta Asal ([id number]) on 03/04/2024 3:06:46 AM         ED COURSE & MEDICAL DECISION MAKING    Medical Decision Making  38 year old female with a complex medical history including ESRD on hemodialysis, lupus  nephritis, hypertension, SVC syndrome, hemorrhagic renal cysts s/p IR embolization, and chronic anticoagulation with Eliquis , presents after a mechanical fall preceded by lightheadedness while preparing food at home overnight. She denies loss of consciousness. She reports landing on her right side with subsequent right arm and right leg pain, as well as chronic bilateral flank pain (right > left) that was present prior to the fall and unchanged from baseline. Last completed HD on Saturday.  No chest pain, SOB, new abdominal pain, or other complaints.      On arrival, she is afebrile and HDS Physical exam demonstrates full range of motion of all extremities, no deformities, no focal bony tenderness, and the ability to ambulate and bear weight without difficulty. Abdomen is soft with mild diffuse tenderness but no guarding or peritoneal signs. PD catheter remains in place without signs of infection. RUE AV fistula with bruit and thrill present. Neurologic exam is nonfocal.    Given fall while on anticoagulation and report of lightheadedness, evaluation included CT imaging and laboratory studies to assess for intracranial hemorrhage, intra-abdominal injury, anemia, electrolyte abnormalities, and cardiac causes of presyncope.  Doubt bony fracture.    Labs:  CBC with WBC 5.7, Hgb 10 (stable as previously 10.3 on 12/3), plts 192  CMP with BUN 60, Cr 5.70  Troponin 10  Imaging:   CTH-negative  CT A/P with IV contrast-  1.    No acute process or significant change compared with the scan of 02/21/2024.       2.    Trace bilateral perinephric fluid is nonspecific and possibly physiologic. Numerous cysts in the atrophic kidneys. Chronic procedure related changes.       3.    Extensive anterior abdominal wall varices, unchanged.       4.    Colonic diverticulosis.       5.    Additional chronic changes as detailed above.   Medications: The patient received the following medications during ED stay:  Medications   iohexol  (OMNIPAQUE) 350 mg iodine/mL solution 75 mL (75 mL Intravenous Given 03/04/24 0352)   promethazine  (PHENERGAN ) tablet 12.5 mg (12.5 mg Oral Given 03/04/24 0425)   HYDROmorphone  (DILAUDID ) tablet 2 mg (2 mg Oral Given 03/04/24 0425)     Re-assessed:  0310-made aware by staff that patient is very angry that I am her doctor as she saw me a few months ago and did not like me.  I actually have not seen this patient  since 2022 so am unsure who she is referencing.  She is on her phone playing a game and appears quite comfortable.  She has also been able to ambulate without any difficulty.  Awaiting work-up at this time.     0355-patient requesting pain and nausea meds.  PO dilaudid  and phenergan  ordered.  Will avoid IV narcotics at this time.    0418-work-up negative.  Imaging without traumatic process.  Unchanged chronic findings.  Hgb stable.  ESRD consistent renal function.  Doubt cardiac etiology.  Doubt other emergent process.  On reassessment she again appears comfortable on her phone.  Stable for discharge with outpatient follow-up. She can discuss pain management with her PCP.     External records were reviewed including osh.   History obtained from patient.  Labs, imaging, and EKG reviewed and independently interpreted by myself and used in the MDM.  Addressed patient questions and concerns including ED management.  This evaluation or treatment was affected by the following social factors:none.  Reviewed discharge instructions with strict precautions given.  Advised patient to schedule follow-up with primary care doctor.  Patient verbalized understanding and agrees with plan.  Patient stable at discharge.    CLINICAL IMPRESSION:   1. Fall, initial encounter    2. Lightheaded    3. History of end stage renal disease        ED DISPOSITION:   Discharge                [1]  Past Surgical History:  Procedure Laterality Date    AV FISTULA PLACEMENT      IR EMBOLIZATION HEMORRHAGE ART OR VEN  LYMPHATIC EXTRAVASATION   02/07/2024    IR EMBOLIZATION HEMORRHAGE ART OR VEN  LYMPHATIC EXTRAVASATION 02/07/2024 Lang Carlin LABOR, MD IMG [name]    PR THORACOSCOPY SURG TOT PULM DECORT Right 08/01/2017    Procedure: THORACOSCOPY SURG; W/TOT PULM DECORTIC/PNEUMOLYS AND EVACUATION OF MEDIASTINAL HEMATOMA;  Surgeon: Morene Nadara Diamond, MD;  Location: MAIN OR [facility];  Service: Thoracic    PR UPPER GI ENDOSCOPY,BIOPSY N/A 01/05/2023    Procedure: ESOPHAGOGASTRODUODENOSCOPY WITH COLD FORCEP BIOPSIES;  Surgeon: Lang Beryl HERO, MD;  Location: ENDO PROCEDURES [name];  Service: Gastroenterology    PR UPPER GI ENDOSCOPY,BIOPSY N/A 01/05/2024    Procedure: UGI ENDOSCOPY; WITH BIOPSY, SINGLE OR MULTIPLE;  Surgeon: Helene Waddell Dragon, MD;  [facility];  Service: Gastroenterology    right chest hematoma evacuation      right chest port removal     [2]  Allergies  Allergen Reactions    Ace Inhibitors Anaphylaxis    Codeine Anaphylaxis    Fentanyl Anaphylaxis    Hydrocodone Anaphylaxis     Tolerates hydromorphone     Lynox [Oxycodone-Acetaminophen] Anaphylaxis    Nsaids (Non-Steroidal Anti-Inflammatory Drug) Anaphylaxis and Rash    Oxycodone Anaphylaxis    Sulfa (Sulfonamide Antibiotics) Anaphylaxis    Tylenol [Acetaminophen] Anaphylaxis    Ondansetron  Hcl Nausea And Vomiting   [3]  Family History  Problem Relation Age of Onset    Diabetes Mother     Hypertension Mother     Hypertension Father     Diabetes Father    [4]  Social History  Socioeconomic History    Marital status: Single    Number of children: 0    Years of education: 14    Highest education level: Some college, no degree   Occupational History    Occupation: church musician   Tobacco Use  Smoking status: Never     Passive exposure: Never    Smokeless tobacco: Never   Vaping Use    Vaping status: Never Used   Substance and Sexual Activity    Alcohol use: Never    Drug use: Never    Sexual activity: Never   Social History Narrative    **  Merged History Encounter **          Social Drivers of Health     Food Insecurity: Low Risk (02/29/2024)    Received from [facility]    Food Insecurity     Within the past 12 months, did the food you bought just not last and you didn't have money to get more?: No     Within the past 12 months, did you worry that your food would run out before you got money to buy more?: No   Tobacco Use: Low Risk (03/04/2024)    Patient History     Smoking Tobacco Use: Never     Smokeless Tobacco Use: Never     Passive Exposure: Never   Transportation Needs: High Risk (02/29/2024)    Received from [facility]    Transportation Needs     Within the past 12 months, has a lack of transportation kept you from medical appointments or from doing things needed for daily living?: Yes   Alcohol Use: Not At Risk (02/05/2024)    Alcohol Use     How often do you have a drink containing alcohol?: Never     How many drinks containing alcohol do you have on a typical day when you are drinking?: 1 - 2     How often do you have 5 or more drinks on one occasion?: Never   Housing: Low Risk (02/29/2024)    Received from [facility]    Housing Stability     Within the past 12 months, have you ever stayed: outside, in a car, in a tent, in an overnight shelter, or temporarily in someone else's home (i.e. couch-surfing)?: No     Are you worried about losing your housing? : No   Physical Activity: Patient Declined (02/05/2024)    Exercise Vital Sign     Days of Exercise per Week: Patient declined     Minutes of Exercise per Session: Patient declined   Utilities: Low Risk (02/29/2024)    Received from [facility]    Utilities     Within the past 12 months, have you been unable to get utilities (heat, electricity) when it was really needed?: No   Stress: No Stress Concern Present (02/05/2024)    [employer] of Occupational Health - Occupational Stress Questionnaire     Feeling  of Stress: Not at all   Interpersonal Safety: Not At Risk (03/04/2024)    Interpersonal Safety     Unsafe Where You Currently Live: No     Physically Hurt by Anyone: No     Abused by Anyone: No   Substance Use: Low Risk (02/05/2024)    Substance Use     In the past year, how often have you used prescription drugs for non-medical reasons?: Never     In the past year, how often have you used illegal drugs?: Never     In the past year, have you used any substance for non-medical reasons?: No   Social Connections: Socially Isolated (02/05/2024)    Social Connection and Isolation Panel  Frequency of Communication with Friends and Family: Never     Frequency of Social Gatherings with Friends and Family: Never     Attends Religious Services: More than 4 times per year     Active Member of [employer] or Organizations: No     Attends Engineer, Structural: Never     Marital Status: Never married   Programmer, Applications: Low Risk (02/05/2024)    Overall Financial Resource Strain (CARDIA)     Difficulty of Paying Living Expenses: Not hard at all   Health Literacy: Low Risk (02/05/2024)    Health Literacy     : Never   Internet Connectivity: No Internet connectivity concern identified (02/05/2024)    Internet Connectivity     Do you have access to internet services: Yes     How do you connect to the internet: Personal Device at home     Is your internet connection strong enough for you to watch video on your device without major problems?: Yes     Do you have enough data to get through the month?: Yes     Does at least one of the devices have a camera that you can use for video chat?: Yes    Jeannetta Asal, DO  03/06/24 2109    "

## 2024-03-04 NOTE — ED Provider Notes (Signed)
 "[facility]  Emergency Department Provider Note     ED Clinical Impression     Final diagnoses:   Hyperkalemia   ESRD (end stage renal disease) (CMS-HCC)   Abdominal pain, unspecified abdominal location (Primary)   Lightheaded      Impression, Medical Decision Making, ED Course     Impression: 38 y.o. 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  Patient notably was seen overnight at [name] for similar symptoms and was found to have no acute injuries of the head or abdomen/pelvis.  Upon initial evaluation in the ER, patient is awake and alert, speaking full sentences, in no acute distress.  Vital signs notable for hypertension but are otherwise within normal limits.  Physical examination reveals diffusely tender abdomen without localized tenderness, palpable masses, CVA tenderness bilaterally, rebound or guarding.  Right upper extremity and right lower extremity diffusely tender without localized tenderness, overlying skin changes, bony deformity, and with full passive and active range of motion.    DDx/MDM: CT imaging overnight reassuring against significant underlying structural abnormalities such as cholecystitis, pancreatitis, SBO, appendicitis, volvulus, significant hemorrhage.  Ongoing right sided abdominal pain with recent history of hemorrhagic renal cyst does raise concern for possible recurrence today as abdominal pain has been ongoing.  Will plan to assess patient's hemoglobin level this morning in case there is a hemorrhagic renal cyst not visualized on CT imaging that is causing recurrence of critically low hemoglobin.  No syncope, lightheadedness to suggest symptomatic anemia at this time, however patient did have a fall yesterday which could be seen in setting of hypovolemic or  hemorrhagic hypotension.  Low clinical concern at this time for hip fracture or other bony abnormalities of the right upper or right lower extremity given diffuse tenderness without deformity and patient currently ambulatory as her baseline, however will pursue x-ray imaging of the hip given fall and abdominal pain.    Proceed with CBC, CMP, x-ray right hip.  Also treat patient's pain and nausea with home Dilaudid  and promethazine .    Orders Placed This Encounter   Procedures    XR Trauma Hip Right    CBC w/ Differential    Comprehensive metabolic panel     ED Course as of 03/07/24 1115   Mon Mar 04, 2024   1018 Reassessed patient at bedside, pain and nausea minimally improved following PO pain and nausea medications. Will allow for more time to pass for meds to take effect.    - X ray without significant abnormality. Patient given dose of Lokelma  given mild elevation in K without evidence of QRS widening, peaked T waves, or other abnormalities on EKG. Patient strongly advised to proceed with HD session tomorrow. Pain well controlled prior to DC. Return precautions discussed.     MDM Elements  Discussion of Management with other Physicians, QHP or Appropriate Source: None  Independent Interpretation of Studies: X-ray(s) -  no fracture or dislocation.   I have reviewed recent and relevant previous record, including: recent ED notes and hospitalization notes reviewed  Escalation of Care including OBS/Admission/Transfer was considered: However, patient was determined to be appropriate for outpatient management.  Social Determinants that significantly affected care: none  Prescription drugs considered but not prescribed: considered insulin  and glucose for intracellular K shift, however mildly elevated K ISO HD with session  tomorrow deems this intervention unnecessary.   Diagnostic tests considered but not performed: considered repeat CT, however one was performed earlier this evening.    ____________________________________________    The case was discussed with the attending physician, who is in agreement with the above assessment and plan.      History     Chief Complaint  Chief Complaint   Patient presents with    Fall     HPI   Meredith Hawkins is a 38 y.o. 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  Patient was notably seen overnight at [name] emergency department for similar symptoms following a fall last night.  Patient is anticoagulated with Eliquis  but did not strike her head or lose consciousness with this fall, CT head at outside hospital negative for intracranial abnormalities.  Per prior note, patient felt dizzy while cooking which caused her to fall landing on her side after which she experienced pain in her right leg and right arm.  Patient has been ambulatory since this time.    Patient has had multiple recent admissions beginning in November 16 at [name] in which she had a hemorrhagic right renal cyst with acute hemoglobin drop warranting interventional radiology coil embolization.  Patient was subsequently seen at [facility] on December 3 with CT abdomen pelvis demonstrating complex right upper pole cystic mass with new bilateral cysts with areas concerning for hemorrhage,    Past Medical History[1]    Past Surgical History[2]    Active Medications[3]     Allergies[4]    Family History[5]    Short Social History[6]     Physical Exam     VITAL SIGNS:      Vitals:    03/04/24 0755 03/04/24 0800 03/04/24 0806 03/04/24 1133   BP:  (!) 174/118  (!) 151/103   Pulse: 78 69  78   Resp:  (!) 24  18   Temp:   36.9 C (98.4 F) 36.8 C (98.2 F)   TempSrc:   Oral Oral   SpO2: 98%   100%     Constitutional: Alert and oriented. No acute distress.  Eyes: Conjunctivae are normal.  HEENT: Normocephalic and  atraumatic. Conjunctivae clear. No congestion. Moist mucous membranes.   Cardiovascular: Rate as above, regular rhythm. Systolic murmur heard at RUSB and LUSB. Normal and symmetric distal pulses. Brisk capillary refill. Normal skin turgor.  Respiratory: Normal respiratory effort. Breath sounds are normal. There are no wheezing or crackles heard.  Gastrointestinal: Soft, diffusely tender, non-distended.  Genitourinary: Deferred.  Musculoskeletal: Normal range of motion in all extremitie, mild diffused tenderness to palpation of RUE and RLE without area of focal pain or deformity.   Neurologic: Normal speech and language. No gross focal neurologic deficits are appreciated. Patient is moving all extremities equally, face is symmetric at rest and with speech.  Skin: Skin is warm, dry and intact. No rash noted.  Psychiatric: Mood and affect are normal. Speech and behavior are normal.     Radiology     XR Trauma Hip Right   Final Result   No acute fracture or dislocation.        Pertinent labs & imaging results that were available during my care of the patient were independently interpreted by me and considered in my medical decision making (see chart for details).    Portions of  this record have been created using Scientist, clinical (histocompatibility and immunogenetics). Dictation errors have been sought, but may not have been identified and corrected.             [1]  Past Medical History:  Diagnosis Date    Asthma (HHS-HCC)     ESRD on dialysis (CMS-HCC)     GERD (gastroesophageal reflux disease)     Hypertension     Lupus     Lupus nephritis (CMS-HCC)     Superior vena cava syndrome     Ventricular septal defect (VSD) (HHS-HCC)    [2]  Past Surgical History:  Procedure Laterality Date    AV FISTULA PLACEMENT      IR EMBOLIZATION HEMORRHAGE ART OR VEN  LYMPHATIC EXTRAVASATION  02/07/2024    IR EMBOLIZATION HEMORRHAGE ART OR VEN  LYMPHATIC EXTRAVASATION 02/07/2024 Lang Carlin LABOR, MD IMG IR [name]    PR THORACOSCOPY SURG TOT PULM DECORT  Right 08/01/2017    Procedure: THORACOSCOPY SURG; W/TOT PULM DECORTIC/PNEUMOLYS AND EVACUATION OF MEDIASTINAL HEMATOMA;  Surgeon: Morene Nadara Diamond, MD;  Location: MAIN OR [facility];  Service: Thoracic    PR UPPER GI ENDOSCOPY,BIOPSY N/A 01/05/2023    Procedure: ESOPHAGOGASTRODUODENOSCOPY WITH COLD FORCEP BIOPSIES;  Surgeon: Lang Beryl HERO, MD;  Location: ENDO PROCEDURES [name];  Service: Gastroenterology    PR UPPER GI ENDOSCOPY,BIOPSY N/A 01/05/2024    Procedure: UGI ENDOSCOPY; WITH BIOPSY, SINGLE OR MULTIPLE;  Surgeon: Helene Waddell Dragon, MD;  [facility];  Service: Gastroenterology    right chest hematoma evacuation      right chest port removal     [3]  No current facility-administered medications for this encounter.     Current Outpatient Medications   Medication Sig Dispense Refill    albuterol  HFA 90 mcg/actuation inhaler Inhale 4 puffs every six (6) hours as needed for wheezing or shortness of breath. (Patient not taking: Reported on 03/04/2024)      amlodipine  (NORVASC ) 10 MG tablet Take 1 tablet (10 mg total) by mouth daily. 100 tablet 3    apixaban  (ELIQUIS ) 5 mg Tab Take 1 tablet (5 mg total) by mouth two (2) times a day.      calcitriol (ROCALTROL) 0.5 MCG capsule Take 2 capsules (1 mcg total) by mouth daily. (Patient not taking: Reported on 03/04/2024)      calcium  acetate (PHOSLO ) 667 mg capsule Take 2 capsules (1,334 mg total) by mouth Three (3) times a day with a meal.      calcium  carbonate (TUMS) 200 mg calcium  (500 mg) chewable tablet Chew 2 tablets (1,000 mg total) two (2) times a day.      HYDROmorphone  (DILAUDID ) 2 MG tablet Take 1 tablet (2 mg total) by mouth every eight (8) hours as needed for severe pain or moderate pain.      pantoprazole  (PROTONIX ) 40 MG tablet Take 1 tablet (40 mg total) by mouth Two (2) times a day (30 minutes before a meal). (Patient not taking: Reported on 03/04/2024)      promethazine  (PHENERGAN ) 12.5 MG tablet Take 1 tablet (12.5  mg total) by mouth every eight (8) hours as needed for nausea.     [4]  Allergies  Allergen Reactions    Ace Inhibitors Anaphylaxis    Codeine Anaphylaxis    Fentanyl Anaphylaxis    Hydrocodone Anaphylaxis     Tolerates hydromorphone     Lynox [Oxycodone-Acetaminophen] Anaphylaxis    Nsaids (Non-Steroidal Anti-Inflammatory Drug) Anaphylaxis and Rash    Oxycodone Anaphylaxis  Sulfa (Sulfonamide Antibiotics) Anaphylaxis    Tylenol [Acetaminophen] Anaphylaxis    Ondansetron  Hcl Nausea And Vomiting   [5]  Family History  Problem Relation Age of Onset    Diabetes Mother     Hypertension Mother     Hypertension Father     Diabetes Father    [6]  Social History  Tobacco Use    Smoking status: Never     Passive exposure: Never    Smokeless tobacco: Never   Vaping Use    Vaping status: Never Used   Substance Use Topics    Alcohol use: Never    Drug use: Never    Viser, Beverley BROCKS, MD  Resident  03/07/24 1120    "

## 2024-03-05 NOTE — Progress Notes (Signed)
"  Patient: Meredith Hawkins  DOB: July 03, 1985  MRN: [id number]  Note Type: Dialysis TCM  Service Date: 03/05/2024    The patient was seen for a face-to-face visit as part of Transitional Care Management services.    Dialysis Location: [facility]  Schedule: Tu-Th-Sa  Shift: 2    ------------------------------  INTERACTIVE CONTACT  ------------------------------    This face-to-face visit occurred within 2 business days of the patients discharge.    ------------------------------  HOSPITALIZATION SUMMARY  ------------------------------  Patient transitioned from: Hospital  Patient transitioned to: Home  Admit Date: 02/28/2024  Discharge Date: 03/02/2024    Discharged info reviewed: No outstanding diagnostic tests and treatments    Reason for admission: nausea, vomiting, anemia    ------------------------------  HOME MEDICATIONS  ------------------------------    Discharge med list reviewed and reconciled - no changes.    ------------------------------  TREATMENT MEDICATIONS ORDERS  ------------------------------    Epoetin  Alfa (Epogen )  2000 units IVP 3X Week  03/02/2024 - 03/01/2025    ------------------------------  PHYSICAL EXAM  ------------------------------    Exam performed.    1+ edema.    ------------------------------  DIALYSIS PRESCRIPTION  ------------------------------    Dry weight during admission reviewed - no change to EDW.    Treatment Data  --------------  Treatment Date: 03/05/2024 started at: 9:07 AM  Dialysate / Machine Temp (prescribed): 37.0*C  Dialysate / Machine Temp (actual): 37.0*C  BFR (prescribed): 500  BFR (average delivered): 450  DFR (prescribed): Autoflow 1.5  DFR (average delivered): 700  Prescribed Time: 04:00  Actual Time: 03:11  EDW (kg): 71.8  Dialyzer: 180NRe Optiflux  Dialysate: 2.0 K, 2.5 Ca, 1.0 Mg, 100 Dextrose  (G2251)  Sodium: 137  Bicarb: 35    Pre Dialysis Vitals  -------------------  Pre BP Sit: 165/112  Pre Wt (kg): 80.6  EDW Deviation (kg): 8.8  Temp:  97.8*F    Post Dialysis Vitals  --------------------  Post BP Sit: 163/98  Post Wt (kg): 77.4    ------------------------------  CARE COORDINATION  ------------------------------    Post-discharge follow-up appointments reviewed with the patient.    COMMENTS: has f/u with urology outpatient for renal cyst.    ------------------------------  VISIT DIAGNOSES  ------------------------------    CPT Code 00503 - High complexity, seen within 7 days of discharge.    Z99.2, N18.6 End stage renal failure on dialysis [facility])  COMMENTS: stable; transitioning to iHD from PD. Now at [facility]. Continues to follow with rounding  nephrology providers    N28.89 Hematoma of kidney  COMMENTS: stable; was recently in the hospital for concern of worsening renal cyst that was  embolized by [name]. CT scan shows stability and no further invasive management was done during this  recent hospitalization and has f/u with urology.      Signed by: SHARLYNN SUZEN RODES, PA-C on 03/06/2024 at 11:04:39 AM  Transcribed by: SHARLYNN SUZEN RODES, PA-C on 03/06/2024 at 11:04:39 AM  "

## 2024-03-05 NOTE — Progress Notes (Signed)
 "Patient: Meredith Hawkins, 07/09/1985, 38y, F  Dialysis Location: [facility]  Attending Nephrologist: Audrey Dunk    Service Date: 03/05/2024  Service Provider: Suzen New, PA  I met face to face with the patient today.    OVERVIEW  The patient presented with ESRD on dialysis  Primary cause of renal failure: Systemic lupus erythematosus, organ or system involvement unspecified    Comments: On Dialysis since 2016 - prior housing issues  February 07 2023 transferred here as did not have a home unit. Lupus, now lives in [place], on hd many years.  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 Noted to have Antral ulcer/esophageal erosion.    8/11 -16 at [name]: Persistent culture negative Peritonitis    Dec 2025- [facility] hospitalization for nausea, vomiting found renal cyst growing in size.    TRANSPLANT  Comments: [facility] transplant    DIALYSIS PRESCRIPTION    IHD 3x Week Start date: 02/27/24    Dialyzer: 180NRe Optiflux    BFR: 500    DFR: Autoflow 1.5    Potassium: 2.0    Sodium: 137    EDW: 71.8    Duration: 4:00    Calcium : 2.5    Bicarb: 35    [facility] updated on: 02/24/2024    TREATMENT ASSESSMENT  Comments: 5/6/25ts scan/staff; labs PEND; dw reset 70, 1459  flow good  5/15/25ts scan/staff; labs reviewed, dw to 51, pd cath placement about5/30     5/22 TR. previous notes seen. Bp and tw seen, tw increased last week but still 3-4kg over. May need to increase further but I will encourage volume education.       5/29/25JM: notes reviewed,PD cath placement tomorrow and home therapy will train her for PD, she is not sure if it is a same surgery for tomorrow, but plans to come back Saturday here for her regular treatment. She will transition to pd soon.     6/12 TR. previous notes seen.  Bp and tw seen. Labs seen. Recent PD Cath placement. She tells me after the procedure. She has been to the ER twice for abdominal pain, negative infectious ups. No recent fevers. Pain is improving and has been prescribed. Pain medicines. She will start PT training next week.  Unfortunately, she has had high phosphorus and potassium. She tells me she has been too relaxed on her diet and restrictions as well as binder compliance.   6/17/25ts scan/staff seen; dw fine, 5/30 pd cath healing, no current complaints, flow 1639 good    7/23 AK: Recent notes reviewed.   [facility] discharge notes reviewed.   -is adjusting well to CAPD . Since she doesnt have residual kidney fn we could add icodextrin at night or switch to CCPD with Ico during the day.   -reviewed low K diet .   -Labs today and then d/c lokelma  and revert to 3 hrs dwells with 4 exchanges   -PET pending  -refer to Rheumatology  -calcitriol 0.75 mcg po qd     8/27 AK: Recent notes and d/c summaries from [facility] and [name] reviewed.   She was completing a course of Vanc/Cefepime and Flucanozole when she was admitted to [name] and noted to have 450 WBC in peritoneal fluid. She  was again continued on Vanc there and then we were not informed about her needing Abx upon discharge .   Now other than nausea during her fills she is not having other symptoms - fluid is clear and doesnt have abd pain . Also K was elevated and she has been on Lokelma  qd. BNP was >10,000  She is noted to be a Average High transporter  -recheck fluid cell count and cultures today  -resume Vanc and Cefepime . Will be dosed by the nurse this time . Will need repeat cell count in a week , If there is worsening cell count then will consider PD cath removal   -we have discussed low K diet with her  -will increase exchanges to 5 exchanges with 3 hrs dwells. She will get trained on CCPD   -continue lokelma  and Flucanozole    10/23 AK: Recent notes and d/c summaries from [facility] med and [facility] reviewed.  Discharged on  10/19 following an admission for Divericulitis. D/CED on Augmentin and Fluconazole  EGD was done - MW tear. She has been continued on Eliquis  . Also had a transfusion .Amlodipine  and Hydralazine  have been discontinued.   She is interested in CCPD and will arrange for training     02/22/24 Recent notes and labs reviewed. BP and TW assessed. AF reviewed. Labs pending.     12/16/25KA: recent notes reviewed. BP and volume reviewed. TCM completed. was recently at [facility] for nausea vomiting, found to have increasing renal cyst that was previously embolized at [name]. She's feeling better but 8 kg over we will challenge her weight a little bit today and I encouraged her to improve her volume gains.  Labs pending.      Blood pressure elevated.     BP Stand Pre    02/27/2024: 160/99    02/24/2024: 137/91    02/22/2024: 134/95    BP Sit Pre    02/27/2024: 164/114    02/24/2024: 142/93    02/22/2024: 146/96    BP Stand Post    02/27/2024: 161/105    02/24/2024: 137/92    02/22/2024: 143/100    BP Sit Post    02/27/2024: 150/105    02/24/2024: 142/96    02/22/2024: 134/88    Prescribed Tx time    02/27/2024: 4:00    02/24/2024: 4:00    02/22/2024: 4:00    Tx Duration    02/27/2024: 4:14    02/24/2024: 4:01    02/22/2024: 4:15    Missed Treatments  1 - last 30 days  1 - last 60 days  12/13 - recent    FLUID ASSESSMENT    Fluid status not acceptable. Interdialytic weight gain not acceptable. No changes indicated.     EDW (kg)    02/27/2024: 71.8    02/24/2024: 71.8    02/22/2024: 71.8    Weight Pre (kg)    02/27/2024: 77.1    02/24/2024: 74.0    02/22/2024: 72.1    Weight Post (kg)    02/27/2024: 73.8    02/24/2024: 71.4    02/22/2024: 71.0    PWV (kg)    02/27/2024: 2.0    02/24/2024: -0.4    02/22/2024: -0.8    UF Rate (mL/kg/hr)    02/27/2024: 10.6    02/24/2024: 9.1    02/22/2024: 3.6    ADEQUACY ASSESSMENT    spKt/V, URR    08/26/2023: 1.8, 77.0    ACCESS ASSESSMENT    Access Type: AVFistula  Access SubType: Unknown    Access  Status: Active (In Use) - 05/28/2015    Access Location: Right Upper Arm    Created: 05/01/2015    Flow    02/22/2024: 1988    08/22/2023: 1639    07/20/2023: 1459    Comments: 08/18/23:  pd cath placement.    Vascular access reviewed. Current access is permanent and functioning well.    ANEMIA ASSESSMENT  Comments: Antibody+ - difficult to get PRBC    HGB, TSAT    02/27/2024: 10.5, -    12/28/2023: 7.1, 52.0    12/01/2023: 7.1, 86.0      Ferritin    10/26/2023: 1534.0    09/11/2023: 607.0    Epoetin  Alfa (Epogen ), Sub Q (units)    01/11/2024: 20000    BMM ASSESSMENT    PTH, Intact    10/26/2023: 788.0    09/11/2023: 496.0      Calcium , Phosphorus    12/28/2023: 8.2, 8.3    12/01/2023: 7.7, 6.2    11/08/2023: 8.6, -    NUTRITION ASSESSMENT  Comments: Lokelma  10 g qd     Potassium, Albumin     12/28/2023: 4.5, 3.5    12/01/2023: 4.5, 3.3    11/15/2023: 4.4, -      eNPCR    08/26/2023: 2.26    DIAGNOSIS  Chief Complaint: N18.6 End stage renal disease      Patient data updated 03/05/2024 at 10:02 AM  Signed By: Sharlynn Iha, PA  on 03/05/2024 10:28:56 AM  "

## 2024-03-14 NOTE — ED Provider Notes (Signed)
 "      [facility] Emergency Department Provider Note  Room: [facility]  [id number]    Medical Decision Making   38 year old female with history of ESRD on dialysis on Tuesday/Thursday/Saturday, lupus nephritis, recurrent diverticulitis, SVC syndrome, renal cyst, renal hemorrhage in 2024, HTN, VSD, aortic arch hypoplasia, asthma, and chronic pain presented with complaints of generalized thunderclap headache with nausea, non-bloody emesis, and dizziness since this morning. She stated dizziness caused her to twist her right knee and fall onto her buttocks region. No head trauma or LOC. EMS gave her morphine  and Zofran  which seemed to have improved her symptoms, but she continues to have some headache, nausea, and right knee/leg pain while in ED. She admitted chronic generalized abdominal and bilateral flank pain for which she has been seen in multiple EDs and admitted to the hospital on 12/10-12/13/25 over the past 2 months. She had a A/P CT at [facility] ED on 03/04/24 that resulted in no acute processes. She also had no acute findings on head CT without contrast and right hip XR during that visit due to similar presentation with fall and head trauma. Patient stated her abdominal and flank pain feels similarly to that ED visit without changes in behavior. She denied all other symptoms. VSS without fever, tachycardia, hypotension, tachypnea, or hypoxia. Physical exam notable for mild tenderness to right knee, generalized abdomen, and bilateral CVA regions. Abdomen is soft with normal bowel sounds and no guarding or rebound. Heart RRR. Normal breathing effort and sounds. No meningeal signs of signs of head trauma. No focal neurologic deficits. My differential diagnoses include acute headache, tension HA, cluster HA, migraine, stroke, intracranial hemorrhage, aneurysm, SAH, HTN, HTN urgency vs emergency, ACS, MI, arrhythmia, URI, sinusitis, bronchitis, electrolyte imbalance, dehydration, anemia, ESRD  needing dialysis, and many other conditions.    Work-up of EKG, CBC, CMP, Mg, troponin x 2, COVID/influenza/RSV swab, chest XR, head CT without contrast, and head/neck CTA performed in ED. EKG showed NSR without significant changes. Troponin 8. Labs notable for WBC 3.2, hgb 10.2, and hx of ESRD with Cr 8.2, BUN 24, and GFR 6 (all along her baseline). Negative respiratory swab. Head CT without contrast showed no acute intracranial processes. Chest XR and head/neck CTA pending at scheduled shift change at 2 AM. Patient care passed to Dr. Donnel pending imaging and re-assessment.   Medical Decision Making        Progress Notes              MDM Elements   Independent Interpretation of Studies: EKG(s): NSR  X-ray(s): No focal consolidation.   CT scan(s): No large intracranial hemorrhage.    I have reviewed recent and relevant previous record, including: Inpatient notes: Admitted from 12/10-12/13/25 for anemia, abdominal pain, and renal cyst  External ED note: [facility] ED visit on 12/15 for similar symptoms.    Diagnostic tests considered but not performed: Considered A/P CT or US , but I have a low suspicion of emergent intraabdominal condition due to her having no acute findings on CT on 12/15 and no changes of symptoms since then.        Disposition     Clinical Impression:    None       Final Disposition: no dispo    History     Chief Complaint in Triage   Patient presents with    Headache     Patient arrived to ED reporting thunderclap headache that came on this morning along with nausea, vomiting,  and flank pain. Patient was given morphine  by EMS, states pain is now a 5/10. Denies CP or SOB.    Nausea, vomiting    Fall     Patient also reports a fall due to dizziness. Denies HS. States right leg twisted under her during the fall.        History of Present Illness  Patient is a 38 year old female with history of ESRD on dialysis on Tuesday/Thursday/Saturday, lupus nephritis, recurrent diverticulitis, SVC syndrome,  renal cyst, renal hemorrhage in 2024, HTN, VSD, aortic arch hypoplasia, asthma, and chronic pain who presents to the ED via EMS with multiple complaints. Patient reports generalized thunderclap headache with nausea, non-bloody emesis, and dizziness since this morning. She states dizziness caused her to twist her right knee and fall onto her buttocks region. She denies head trauma or LOC during injury. She called 911 and EMS gave her morphine  and Zofran  which seemed to have improved her symptoms, but she continues to have some headache, nausea, and right knee/leg pain while in ED. She denies active dizziness. She admits chronic generalized abdominal and bilateral flank pain for which she has been seen in multiple EDs and admitted to the hospital on 12/10-12/13/25 over the past 2 months. She had a A/P CT at [facility] ED on 03/04/24 that resulted in no acute processes. Noted trace bilateral perinephric fluid, numerous cysts in atrophic kidneys, unchanged anterior abdominal wall varices, and diverticulosis. She also had no acute findings on head CT without contrast and right hip XR during that visit due to similar presentation with fall and head trauma. Patient states her abdominal and flank pain feels similarly to that ED visit without changes in behavior. While in ED, she denies all other symptoms. She denies fever, chills, nasal congestion, rhinorrhea, sneezing, sore throat, cough, SOB, dyspnea, wheezing, chest pain, palpitations, diarrhea, constipation, blood in her stool or vomit, pelvic pain, vaginal bleeding or discharge, syncope, vision changes, diplopia, blurred vision, motor weakness, numbness, paresthesias, gait or speech changes, AMS, confusion, or other symptoms. She does not urinate due to ESRD. She denies hx of head injuries or intracranial hemorrhages. She does take Eliquis . She did not try anything at home for symptoms. Morphine  and Zofran  did help from EMS. Her last dialysis session was on Tuesday and she  is scheduled to go in the morning due to today being Christmas.     Addition Historian(s): EMS - headache and fall.  Improvement of pain after morphine          MEDICATIONS:   Patient's Medications   Previous Medications    AMLODIPINE  (NORVASC ) 10 MG TABLET    Take 1 tablet (10 mg total) by mouth daily as needed.    APIXABAN  (ELIQUIS ) 5 MG TAB TABLET    Take 1 tablet (5 mg total) by mouth 2 (two) times a day.    CALCIUM  ACETATE,PHOSPHAT BIND, (PHOSLO ) 667 MG CAPSULE    Take 2 capsules (1,334 mg total) by mouth 3 (three) times a day with meals for 60 days.    CALCIUM  CARBONATE (TUMS) 200 MG CALCIUM  (500 MG) CHEWABLE TABLET    Chew 2 tablets (1,000 mg total) 2 (two) times a day.    EPOETIN  ALFA-EPBX (RETACRIT ) 10,000 UNIT/ML INJECTION    Infuse 1 mL (10,000 Units total) into a venous catheter During dialysis.    HYDRALAZINE  (APRESOLINE ) 50 MG TABLET    Take by mouth.   Modified Medications    No medications on file       ALLERGIES:  Allergies[1]    PAST MEDICAL HISTORY:  Past Medical History[2]    PAST SURGICAL HISTORY: Past Surgical History[3]    SOCIAL HISTORY:   Social History     Tobacco Use    Smoking status: Never    Smokeless tobacco: Never   Substance Use Topics    Alcohol use: No        Social Drivers of Health with Concerns     Transportation Needs: High Risk (02/29/2024)     FAMILY HISTORY:  Family History[4]       Physical Exam      ED Triage Vitals [03/14/24 1705]   Temp 98 F (36.7 C)   Temp src    Heart Rate 77   BP 162/88   Respirations 16   SpO2 99 %     Reviewed vital signs and nursing note as charted by RN.    Physical Exam    Adult Medical Physical Exam  Reviewed vital signs and nursing note as charted by RN.    CONSTITUTIONAL: Well-appearing; well-nourished. Alert and oriented and responds appropriately to questions.   HEAD: Normocephalic; atraumatic  EYES: PERRL; Intact EOMs; Conjunctivae clear, sclerae non-icteric  ENT: Normal nose; no rhinorrhea; moist mucous membranes  NECK: Supple without  meningismus; nontender; no masses  CARD: Regular rate and rhythm; symmetric distal pulses  RESP: Normal chest excursion without splinting or tachypnea; breath sounds clear and equal bilaterally; no wheezes, no rhonchi, no rales   ABD/GI: Soft, non-distended abdomen with normal bowel sounds and generalized tenderness. No guarding or rebound.   BACK: Bilateral CVA tenderness.   EXT: Mild right knee tenderness with movement. No obvious deformity, laxity, instability, swelling, ecchymosis, erythema, pallor, coolness, warmth, open wound, or signs of trauma. Good ROM, sensation, cap refill, and 2+ pulses. Compartments soft.   SKIN: Normal color for age and race; warm; dry; capillary refill < 2 seconds; no acute lesions noted  NEURO: No focal neurologic deficits. Moves all extremities equally; Motor and sensory function intact   PSYCH: The patient's mood and manner are appropriate. Grooming and personal hygiene are appropriate.         Results      I personally reviewed the results in the chart as listed below    Results for orders placed or performed during the hospital encounter of 03/14/24   COVID, RSV and Influenza A/B Amplified Probes   Result Value Ref Range    Influenza A, Amplified Probe Negative Negative    Influenza B, Amplified Probe Negative Negative    RSV, Amplified Probe Negative Negative    SARS-CoV-2 Negative Negative   CBC with Differential   Result Value Ref Range    Differential Percent Diff %     Differential Absolute Diff Absolute     WBC 3.2 (L) 3.6 - 11.2 K/uL    RBC 3.45 (L) 3.63 - 4.92 M/uL    Hemoglobin 10.2 (L) 10.9 - 14.3 g/dL    Hematocrit 31 31 - 42 %    Mean Cell Volume 90 74 - 96 fL    Mean Cell Hemoglobin 29 24 - 33 pg    Mean Cell Hemoglobin Concentration 33 33 - 36 g/dL    RDW 83.8 87.6 - 82.9 %    Platelet Count 162 150 - 450 K/uL    Mean Platelet Volume 7.7 7.5 - 11.2 fL    Neutrophils 50 43 - 77 %    Lymphocytes 24 16 - 44 %    Monocytes 12  5 - 13 %    Eosinophils 12 (H) 1 - 8 %     Basophils 2 (H) 0 - 1 %    Neutrophils Absolute 1.6 (L) 1.8 - 7.8 K/uL    Lymphocytes Absolute 0.8 (L) 1.0 - 3.0 K/uL    Monocytes Absolute 0.4 0.3 - 1.0 K/uL    Eosinophils Absolute 0.4 0.0 - 0.5 K/uL    Basophils Absolute 0.1 0.0 - 0.1 K/uL    Nucleated RBCS 0 /100 WBC   BMP   Result Value Ref Range    Sodium 136 136 - 145 mmol/L    Potassium 4.4 3.5 - 5.1 mmol/L    Chloride 97 (L) 98 - 107 mmol/L    CO2 24 21 - 31 mmol/L    BUN 24 7 - 25 mg/dL    Creatinine 1.79 (H) 0.60 - 1.20 mg/dL    Glucose, Random 80 70 - 199 mg/dL    Calcium , Total 9.0 8.6 - 10.3 mg/dL    Osmolality (calculated) 275 270 - 295 mOsm/kg    Anion Gap 15 (H) 4 - 12   Magnesium Level   Result Value Ref Range    Magnesium 2.1 1.7 - 2.4 mg/dL   Lab Add On   Result Value Ref Range    Lab Add On See Text    Troponin I High Sensitivity   Result Value Ref Range    Troponin I High Sensitivity 8 0 - 18 pg/mL   Hepatic Function Panel   Result Value Ref Range    Albumin  4.0 3.5 - 5.7 g/dL    Bilirubin, Total 0.4 0.3 - 1.0 mg/dL    Bilirubin, Direct 0.0 0.0 - 0.2 mg/dL    Alkaline Phosphatase 98 34 - 104 IU/L    Protein, Total 6.8 6.4 - 8.9 g/dL    ALT 21 7 - 52 IU/L    AST 24 13 - 39 IU/L    Albumin /Globulin Ratio 1.4 1.2 - 2.3   Troponin Delta   Result Value Ref Range    Troponin Delta <4 pg/mL   eGFR (CKD-EPI)   Result Value Ref Range    eGFR 6 (A) mL/min/1.58m2   Lab Add On   Result Value Ref Range    Lab Add On PROCESSED    hCG, Quantitative   Result Value Ref Range    HCG Quantitative <3 <3 mIU/mL   Glucose, Random, Point of Care   Result Value Ref Range    Glucose, Random, Point of Care 82 70 - 199 mg/dL     Imaging Results              CTA Head and Neck With IV Contrast (Final result)  Result time 03/15/24 03:51:54      Final result by Ozell Burnard Heinz, MD (03/15/24 03:51:54)                   Impression:    IMPRESSION:  1.  No evidence of dissection, occlusion, aneurysm, or hemodynamically significant stenosis of the arteries in the  head/neck.  2.  Left cerebellar developmental venous anomaly.  3.  Extensive thoracic varicosities.               Narrative:    CTA HEAD AND NECK    HISTORY: HA and dizzy    COMPARISONS: 10/28/2021    TECHNIQUE/PROTOCOL: Postcontrast enhanced head and neck CT arteriogram was performed. Scanning performed during the arterial phase from the thoracic inlet to the  circle of Willis.   -3-D images (volume rendered, surface rendered, and/or MIP images) were produced, and stored on PACS.  -CT source data was analyzed using artificial intelligence software for detection of large vessel occlusions (LVO) in the brain  -All carotid measurements were made according to the NASCET criteria.      CONTRAST: 100 mL Omnipaque-350 IV.  IV contrast was administered to improve disease detection and further define anatomy.\XA0\    DICOM format image data available to non-affiliated external healthcare facilities or entities on a secure, media free, reciprocally searchable basis with patient authorization for at least a 12 month period after the study.    Dose reduction technique was used on this scan by utilizing automated exposure control, adjustment of the mA and/or kV according to the patient size.    FINDINGS:   Scout film: Unremarkable    NECK VASCULATURE FINDINGS:  Arch & Subclavian Arteries:      Standard three vessel arch. Subclavian arteries are normal bilaterally.  Right carotid:      Patent from the origin to the skull base.  Left carotid:      Patent from the origin to the skull base.  Vertebral arteries:   Dominant right vertebral artery.   Patent bilaterally to the vertebrobasilar confluence.    BRAIN VASCULATURE FINDINGS:  Vasculature is smooth in contour. No aneurysm. No significant intracranial arterial vascular stenosis or occlusion.    EXTRAVASCULAR FINDINGS:   Left cerebellar developmental venous anomaly. No intracranial mass, mass effect or midline shift. No intracranial hemorrhage.  Cerebellum and brainstem appear  unremarkable. No abnormality involving the ventricles or  basal cisterns.     Please note that the phase of contrast was optimized for evaluation of vasculature. This may limit assessment of the soft tissues of the neck. No enlarged or abnormal appearing lymph nodes. Airway appears patent. Salivary glands appear unremarkable. No thyroid mass. Extensive varicosities are seen in the anterior chest soft tissues. Renal osteodystrophy. Right SVC stent in place.                                                  CT Head Without IV Contrast (Final result)  Result time 03/14/24 23:22:19   Procedure changed from CTA Head and Neck With IV Contrast     Final result by Reyes Inks, MD (03/14/24 23:22:19)                   Impression:    IMPRESSION:   No acute intracranial process.               Narrative:    CT HEAD WITHOUT CONTRAST:    HISTORY:  Headache and dizziness    COMPARISON: 05/03/2023    CONTRAST: Study was performed without contrast.    TECHNIQUE: Routine axial images from the skull base to the vertex.    DICOM format image data available to non-affiliated external healthcare facilities or entities on a secure, media free, reciprocally searchable basis with patient authorization for at least a 12 month period after the study.    Dose reduction technique was used on this scan by utilizing automated exposure control, adjustment of the mA and/or kV according to the patient size.    FINDINGS:   Scout film: Unremarkable    Parenchyma:     Stable mineralization in the right  basal ganglia and left dentate nucleus. No mass, midline shift, or edema.  White matter appears normal for age.  No intracranial hemorrhage or extraaxial fluid collection. Cerebellum and brainstem are without focal abnormality.    Ventricular system:     Ventricles appear normal.    Additional findings:     Calvarium intact. Orbits symmetric.  Sinuses and mastoid air cells are clear.                                                     XR Chest 2 Views  (In process)    Procedure changed from XR Chest 1 View                             XR Knee Right (In process)                            ECG Results              ECG 12 lead; (Final result)  Result time 03/14/24 18:19:19      Final result                   Impression:    Normal sinus rhythm  Cannot rule out Anterior infarct , age undetermined  Abnormal ECG  When compared with ECG of 21-Jan-2024 11:18,  No significant change was found  Confirmed by TRUDY, JEFFERSON ([id number]) on 03/14/2024 6:19:15 PM               Narrative:    Ventricular Rate: 76  Atrial Rate: 76  P-R Interval: 208  QRS Duration: 98  Q-T Interval: 420  QTC Calculation(Bazett): 472  P Axis: 38  R Axis: 32  T Axis: 53                                                [1]  Allergies  Allergen Reactions    Ace Inhibitors Anaphylaxis    Acetaminophen Anaphylaxis    Enalapril Maleate Anaphylaxis    Hydrocodone Anaphylaxis     Pt has had morphine  in the past, states it makes her nauseous    Oxycodone Anaphylaxis    Oxycodone-Acetaminophen Anaphylaxis    Sulfa (Sulfonamide Antibiotics) Anaphylaxis and Other (See Comments)     Other reaction(s): Anaphylaxis    Sulfamethoxazole-Trimethoprim Anaphylaxis    Tylenol-Codeine Solution Anaphylaxis    Nsaids (Non-Steroidal Anti-Inflammatory Drug) Rash     Lupus patient; states can take phenergan   Not an actual allergy, pt cannot take as a dialysis pt   [2]  Past Medical History:  Diagnosis Date    Asthma     Chronic pain     Dialysis patient     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    123.93 02/09/24 per CE outpt scanned    Hypertension     Hypoglycemia 04/22/2020    Lupus     Renal disorder     Renal hemorrhage, left 07/03/2022    Superior vena cava syndrome     VSD (ventricular septal defect and aortic arch hypoplasia    [3]  Past Surgical  History:  Procedure Laterality Date    CENTRAL VENOUS CATHETER INSERTION  12/15/2014    CENTRAL VENOUS CATHETER INSERTION  12/16/2014    CENTRAL VENOUS  CATHETER INSERTION  05/17/2018    CENTRAL VENOUS CATHETER REMOVAL  12/13/2014    CENTRAL VENOUS CATHETER REMOVAL  06/26/2018    hematoma removal      INTERVENTIONAL RADIOLOGY VASCULAR  04/25/2019    PR EGD TRANSORAL BIOPSY SINGLE/MULTIPLE N/A 10/05/2023    Procedure: ESOPHAGOGASTRODUODENOSCOPY W/ BIOPSY;  Surgeon: Garrie Deward Stank, MD;  Location: [facility] END[facility];  Service: Gastroenterology    removal chest port     [4]  Family History  Problem Relation Age of Onset    Diabetes Mother     Diabetes Father     Kallie Alexandra Seimet, PA-C  03/15/24 0401    "

## 2024-03-18 ENCOUNTER — Emergency Department: Admit: 2024-03-18 | Payer: MEDICAID

## 2024-03-18 ENCOUNTER — Inpatient Hospital Stay
Admit: 2024-03-18 | Discharge: 2024-03-19 | Disposition: A | Discharge status: Home / Self Care | Payer: MEDICAID | Arrived: VH | Attending: Student in an Organized Health Care Education/Training Program

## 2024-03-18 LAB — EXTRA TUBES HOLD

## 2024-03-18 MED ORDER — ONDANSETRON HCL 4 MG/2ML IJ SOLN
4 | INTRAMUSCULAR | Status: AC
Start: 2024-03-18 — End: 2024-03-18
  Administered 2024-03-18: 23:00:00 4 mg via INTRAVENOUS

## 2024-03-18 MED ORDER — HYDROMORPHONE 0.5MG/0.5ML IJ SOLN
1 | Freq: Once | Status: AC
Start: 2024-03-18 — End: 2024-03-18
  Administered 2024-03-18: 23:00:00 0.5 mg via INTRAVENOUS

## 2024-03-18 MED ORDER — DIPHENHYDRAMINE HCL 50 MG/ML IJ SOLN
50 | INTRAMUSCULAR | Status: AC
Start: 2024-03-18 — End: 2024-03-18
  Administered 2024-03-19: 01:00:00 12.5 mg via INTRAVENOUS

## 2024-03-18 MED FILL — ONDANSETRON HCL 4 MG/2ML IJ SOLN: 4 MG/2ML | INTRAMUSCULAR | Qty: 2 | Fill #0

## 2024-03-18 MED FILL — HYDROMORPHONE HCL 1 MG/ML IJ SOLN: 1 mg/mL | INTRAMUSCULAR | Qty: 0.5 | Fill #0

## 2024-03-18 NOTE — ED Notes (Signed)
"  12:44 PM  I have evaluated the patient as the Provider in Rapid Medical Evaluation (RME). I have reviewed her vital signs and the triage nurse assessment. I have talked with the patient and any available family and advised that I am the provider in triage and have ordered the appropriate study to initiate their work up based on the clinical presentation during my assessment. I have advised that the patient will be accommodated in the Main ED as soon as possible. I have also requested to contact the triage nurse or myself immediately if the patient experiences any changes in their condition during this brief waiting period.    38 year old female presents with complaint of generalized abdominal and flank pain over the past 5 days with associated nausea and vomiting.  She reports 5-10 episodes of vomiting per day.  Has a peritoneal catheter in place, however reports she is on hemodialysis Tuesday Thursday Saturday.  She is here visiting from [place] due to a family emergency.  She does not have a plan for dialysis tomorrow.  Denies fever or changes in bowel movements.    Gordie Belvin K Saralyn Willison, PA-C       Meridian Scherger K, PA-C  03/18/24 1244    "

## 2024-03-18 NOTE — ED Notes (Incomplete)
"  ED SIGN OUT NOTE  Care assumed at [facility]. [facility] 11:57 PM EST    Patient was signed out to me by Dr. Phylis.     Patient is awaiting ***.    BP (!) 161/95   Pulse 85   Temp 97.4 F (36.3 C) (Oral)   Resp 26   Wt 78.1 kg (172 lb 2.9 oz)   SpO2 94%   BMI 27.79 kg/m     Labs Reviewed   CBC WITH AUTO DIFFERENTIAL - Abnormal; Notable for the following components:       Result Value    RBC 3.16 (*)     Hemoglobin 9.4 (*)     Hematocrit 28.4 (*)     RDW 15.4 (*)     Eosinophils % 11.8 (*)     Basophils % 1.1 (*)     Lymphocytes Absolute 0.71 (*)     Eosinophils Absolute 0.55 (*)     All other components within normal limits   COMPREHENSIVE METABOLIC PANEL - Abnormal; Notable for the following components:    Sodium 133 (*)     Chloride 92 (*)     Anion Gap 20 (*)     Glucose 105 (*)     BUN 39 (*)     Creatinine 8.74 (*)     BUN/Creatinine Ratio 4 (*)     Est, Glom Filt Rate 6 (*)     Alk Phosphatase 110 (*)     Albumin /Globulin Ratio 1.0 (*)     All other components within normal limits   CULTURE, BODY FLUID (WITH GRAM STAIN)   EXTRA TUBES HOLD   LIPASE   POTASSIUM   BODY FLUID CELL COUNT     CT ABDOMEN PELVIS WO CONTRAST Additional Contrast? None   Final Result   Trace pneumoperitoneum is nonspecific in patient on peritoneal dialysis   Incidental sigmoid diverticulosis, third spacing, varices      Electronically signed by Devere Cal          ED Course as of 03/18/24 2357   Mon Mar 18, 2024   2146 Potassium: 4.3 [AS]   2146 No EKG changes, no hyperkalemia. Not hypoxic. Does not seem to need emergent dialysis. [AS]   2347 EKG 12 Lead (Syncope)  EKG interpreted by me, normal sinus rhythm, mild first-degree AV block, rate 80, normal axis, no STEMI.  No concerning ST or T wave changes. [AS]   2355 Spoke to nephrology tara celinda - PD nurse will come by later tonight. They can put her on the list to get hemodialysis in the AM. [AS]      ED Course User Index  [AS] Phylis Cornet, MD       Diagnosis:   No  diagnosis found.    Disposition:        Plan:   ***    Marayah Higdon, DO  "

## 2024-03-18 NOTE — ED Triage Notes (Addendum)
"  Pt c/o bilateral flank pain and abd pain , +n/v, has a peritoneal catheter , now on HD, denies changes in bowels , denies fever , hx of kidney bleed and had an embolization in the past  "

## 2024-03-18 NOTE — ED Provider Notes (Shared)
 "      [facility]  EMERGENCY DEPARTMENT ENCOUNTER      Pt Name: Meredith Hawkins  MRN: [id number]  Birthdate 1985/08/02  Date of evaluation: 03/18/2024  Provider: Juliene Duet, MD    CHIEF COMPLAINT       Chief Complaint   Patient presents with    Flank Pain       HISTORY OF PRESENT ILLNESS    HPI    38 year old female history of end-stage renal disease on hemodialysis Tuesday Thursday Saturday here for 5 days of constant right flank pain.  Reports similar episodes in the past.  Associated with nausea, vomiting.  Denies diarrhea, fever, chest pain or shortness of breath.  Has been compliant with dialysis.  States he is currently visiting from [place] , is due for dialysis tomorrow.  States she has been in contact with her own dialysis center and plans to either return there or set up something here.     She has been seen at outside ERs per the electronic medical record several times over the past few weeks for flank pain.  She had a hemorrhagic renal cyst. Also hx of diverticulitis.    Nursing notes reviewed.    REVIEW OF SYSTEMS     Review of Systems  Unless otherwise stated, a complete review of systems was asked of the patient. Pertinent positives are noted in the HPI section.    PAST MEDICAL HISTORY     Past Medical History:   Diagnosis Date    Asthma     Lupus (HCC)     VSD (ventricular septal defect)        SURGICAL HISTORY     No past surgical history on file.    CURRENT MEDICATIONS       Previous Medications    AMLODIPINE  (NORVASC ) 10 MG TABLET    Take 1 tablet by mouth daily    APIXABAN  (ELIQUIS ) 2.5 MG TABS TABLET    Take 1 tablet by mouth 2 times daily    CALCIUM  ACETATE (PHOSLO ) 667 MG CAPS CAPSULE    Take 1 capsule by mouth 3 times daily (with meals)    HYDRALAZINE  (APRESOLINE ) 25 MG TABLET    Take 1 tablet by mouth 3 times daily    METOPROLOL  SUCCINATE (TOPROL  XL) 25 MG EXTENDED RELEASE TABLET    Take 1 tablet by mouth daily    PANTOPRAZOLE  (PROTONIX ) 40 MG TABLET    Take 1  tablet by mouth daily       ALLERGIES     Ace inhibitors, Acetaminophen, Codeine, Fentanyl, Hydrocodone, Nsaids, Oxycodone, and Sulfa antibiotics    FAMILY HISTORY     No family history on file.     SOCIAL HISTORY       Social History     Socioeconomic History    Marital status: Single   Tobacco Use    Smoking status: Never    Smokeless tobacco: Never   Substance and Sexual Activity    Alcohol use: Never    Drug use: Never     Social Drivers of Psychologist, Prison And Probation Services Strain: Low Risk (02/05/2024)    Received from [facility]    Overall Financial Resource Strain (CARDIA)     How hard is it for you to pay for the very basics like food, housing, medical care, and heating?: Not hard at all   Food Insecurity: Low Risk (02/29/2024)    Received from [facility]    Food Insecurity     Within the past 12 months, did the food you bought just not last and you didn't have money to get more?: No     Within the past 12 months, did you worry that your food would run out before you got money to buy more?: No   Transportation Needs: High Risk (02/29/2024)    Received from [facility]    Transportation Needs     Within the past 12 months, has a lack of transportation kept you from medical appointments or from doing things needed for daily living?: Yes   Physical Activity: Patient Declined (02/05/2024)    Received from [facility]    Exercise Vital Sign     On average, how many days per week do you engage in moderate to strenuous exercise (like a brisk walk)?: Patient declined     On average, how many minutes do you engage in exercise at this level?: Patient declined   Stress: No Stress Concern Present (02/05/2024)    Received from [facility] - Occupational Stress Questionnaire     Do you feel stress - tense, restless, nervous, or anxious, or unable to sleep at night because your mind is troubled all the time - these days?: Not at all    Social Connections: Socially Isolated (02/05/2024)    Received from [facility]    Social Connection and Isolation Panel     In a typical week, how many times do you talk on the phone with family, friends, or neighbors?: Never     How often do you get together with friends or relatives?: Never     How often do you attend church or religious services?: More than 4 times per year     Do you belong to any clubs or organizations such as church groups, unions, fraternal or athletic groups, or school groups?: No     How often do you attend meetings of the clubs or organizations you belong to?: Never     Are you married, widowed, divorced, separated, never married, or living with a partner?: Never married   Intimate Partner Violence: Not At Risk (03/04/2024)    Received from [facility]    Humiliation, Afraid, Rape, and Kick questionnaire     Within the last year, have you been raped or forced to have any kind of sexual activity by your partner or ex-partner?: No     Within the last year, have you been kicked, hit, slapped, or otherwise physically hurt by your partner or ex-partner?: No     Within the last year, have you been humiliated or emotionally abused in other ways by your partner or ex-partner?: No     Within the last year, have you been afraid of your partner or ex-partner?: No   Housing Stability: Low Risk (02/29/2024)    Received from [facility]    Housing Stability     Within the past 12 months, have you ever stayed: outside, in a car, in a tent, in an overnight shelter, or temporarily in someone else's home (i.e. couch-surfing)?: No     Are you worried about losing your housing? : No       PHYSICAL EXAM       ED Triage Vitals [03/18/24 1243]   BP Girls Systolic BP Percentile Girls Diastolic BP Percentile Boys Systolic BP Percentile Boys Diastolic  BP Percentile Temp Temp Source Pulse   (!) 173/109 -- -- -- -- 98.4 F (36.9 C) Oral 87      Respirations SpO2 Height Weight - Scale        16 94 % -- 78.1 kg (172 lb 2.9 oz)         Physical Exam  Constitutional:       Appearance: Normal appearance.   HENT:      Head: Normocephalic and atraumatic.      Nose: Nose normal.      Mouth/Throat:      Mouth: Mucous membranes are moist.   Eyes:      General: No scleral icterus.     Extraocular Movements: Extraocular movements intact.   Cardiovascular:      Rate and Rhythm: Normal rate.      Pulses: Normal pulses.   Pulmonary:      Effort: Pulmonary effort is normal.      Breath sounds: Normal breath sounds.   Abdominal:      General: Abdomen is flat.   Musculoskeletal:         General: No deformity or signs of injury.      Cervical back: Normal range of motion and neck supple.   Skin:     General: Skin is warm.   Neurological:      General: No focal deficit present.      Mental Status: She is alert.   Psychiatric:         Mood and Affect: Mood normal.         DIAGNOSTIC RESULTS   EKG: If obtained, EKG interpretation provided in the ED course    RADIOLOGY:   CT ABDOMEN PELVIS WO CONTRAST Additional Contrast? None    (Results Pending)        LABS:  Labs Reviewed   CBC WITH AUTO DIFFERENTIAL   COMPREHENSIVE METABOLIC PANEL   EXTRA TUBES HOLD   LIPASE       All other labs were within normal range or not returned as of this dictation.    EMERGENCY DEPARTMENT COURSE and DIFFERENTIAL DIAGNOSIS/MDM:   Vitals:    Vitals:    03/18/24 1243   BP: (!) 173/109   Pulse: 87   Resp: 16   Temp: 98.4 F (36.9 C)   TempSrc: Oral   SpO2: 94%   Weight: 78.1 kg (172 lb 2.9 oz)       Medical Decision Making  R flank pain, recurrent  -appears well, no AMS or concerning symptoms to suggest SBP.  -will rule out need for emergent dialysis  -CT abd/pelvis to assess for diverticulitis or renal cyst.    Risk  Prescription drug management.        REASSESSMENT        CONSULTS:  None    PROCEDURES:  Procedures    FINAL IMPRESSION    No diagnosis found.      DISPOSITION/PLAN   DISPOSITION      (Please note that portions of this note were  completed with a voice recognition program.  Efforts were made to edit the dictations but occasionally words are mis-transcribed.)    Juliene Duet, MD (electronically signed)  Emergency Attending Physician   "

## 2024-03-19 LAB — POTASSIUM: Potassium: 4.3 mmol/L (ref 3.5–5.1)

## 2024-03-19 LAB — EKG 12-LEAD
Atrial Rate: 80 {beats}/min
P Axis: 25 degrees
P-R Interval: 220 ms
Q-T Interval: 404 ms
QRS Duration: 98 ms
QTc Calculation (Bazett): 465 ms
R Axis: 29 degrees
T Axis: 61 degrees
Ventricular Rate: 80 {beats}/min

## 2024-03-19 LAB — CBC WITH AUTO DIFFERENTIAL
Basophils %: 1.1 % — ABNORMAL HIGH (ref 0.0–1.0)
Basophils Absolute: 0.05 K/UL (ref 0.00–0.10)
Eosinophils %: 11.8 % — ABNORMAL HIGH (ref 0.0–7.0)
Eosinophils Absolute: 0.55 K/UL — ABNORMAL HIGH (ref 0.00–0.40)
Hematocrit: 28.4 % — ABNORMAL LOW (ref 35.0–47.0)
Hemoglobin: 9.4 g/dL — ABNORMAL LOW (ref 11.5–16.0)
Immature Granulocytes %: 0.2 % (ref 0.0–0.5)
Immature Granulocytes Absolute: 0.01 K/UL (ref 0.00–0.04)
Lymphocytes %: 15.2 % (ref 12.0–49.0)
Lymphocytes Absolute: 0.71 K/UL — ABNORMAL LOW (ref 0.80–3.50)
MCH: 29.7 pg (ref 26.0–34.0)
MCHC: 33.1 g/dL (ref 30.0–36.5)
MCV: 89.9 FL (ref 80.0–99.0)
MPV: 10 FL (ref 8.9–12.9)
Monocytes %: 8.3 % (ref 5.0–13.0)
Monocytes Absolute: 0.39 K/UL (ref 0.00–1.00)
Neutrophils %: 63.4 % (ref 32.0–75.0)
Neutrophils Absolute: 2.99 K/UL (ref 1.80–8.00)
Nucleated RBCs: 0 /100{WBCs}
Platelets: 233 K/uL (ref 150–400)
RBC: 3.16 M/uL — ABNORMAL LOW (ref 3.80–5.20)
RDW: 15.4 % — ABNORMAL HIGH (ref 11.5–14.5)
WBC: 4.7 K/uL (ref 3.6–11.0)
nRBC: 0 K/uL (ref 0.00–0.01)

## 2024-03-19 LAB — COMPREHENSIVE METABOLIC PANEL
ALT: 17 U/L (ref 10–35)
Albumin/Globulin Ratio: 1 — ABNORMAL LOW (ref 1.1–2.2)
Albumin: 3.6 g/dL (ref 3.5–5.2)
Alk Phosphatase: 110 U/L — ABNORMAL HIGH (ref 35–104)
Anion Gap: 20 mmol/L — ABNORMAL HIGH (ref 2–14)
BUN/Creatinine Ratio: 4 — ABNORMAL LOW (ref 12–20)
BUN: 39 mg/dL — ABNORMAL HIGH (ref 6–20)
CO2: 21 mmol/L (ref 20–29)
Calcium: 8.9 mg/dL (ref 8.6–10.0)
Chloride: 92 mmol/L — ABNORMAL LOW (ref 98–107)
Creatinine: 8.74 mg/dL — ABNORMAL HIGH (ref 0.60–1.00)
Est, Glom Filt Rate: 6 ml/min/1.73m2 — ABNORMAL LOW (ref >59)
Globulin: 3.6 g/dL (ref 2.0–4.0)
Glucose: 105 mg/dL — ABNORMAL HIGH (ref 65–100)
Sodium: 133 mmol/L — ABNORMAL LOW (ref 136–145)
Total Bilirubin: 0.3 mg/dL (ref 0.0–1.2)
Total Protein: 7.2 g/dL (ref 6.4–8.3)

## 2024-03-19 LAB — HEPATITIS B SURFACE ANTIGEN: Hepatitis B Surface Ag: NONREACTIVE

## 2024-03-19 LAB — HEPATITIS B SURFACE ANTIBODY: Hep B S Ab: 144 m[IU]/mL

## 2024-03-19 LAB — LIPASE: Lipase: 41 U/L (ref 13–60)

## 2024-03-19 MED ORDER — ALBUMIN HUMAN 25 % IV SOLN
25 | INTRAVENOUS | Status: DC | PRN
Start: 2024-03-19 — End: 2024-03-19
  Administered 2024-03-19: 15:00:00 25 g via INTRAVENOUS

## 2024-03-19 MED ORDER — HYDROMORPHONE 0.5MG/0.5ML IJ SOLN
1 | Freq: Once | Status: AC
Start: 2024-03-19 — End: 2024-03-19
  Administered 2024-03-19: 12:00:00 0.5 mg via INTRAVENOUS

## 2024-03-19 MED ORDER — DIANEAL LOW CALCIUM/2.5% DEX 395 MOSM/L IP SOLN
395 | Freq: Once | INTRAPERITONEAL | Status: DC
Start: 2024-03-19 — End: 2024-03-19

## 2024-03-19 MED ORDER — DIPHENHYDRAMINE HCL 50 MG/ML IJ SOLN
50 | Freq: Once | INTRAMUSCULAR | Status: AC
Start: 2024-03-19 — End: 2024-03-19
  Administered 2024-03-19: 07:00:00 12.5 mg via INTRAVENOUS

## 2024-03-19 MED ORDER — DIPHENHYDRAMINE HCL 50 MG/ML IJ SOLN
50 | Freq: Once | INTRAMUSCULAR | Status: AC
Start: 2024-03-19 — End: 2024-03-19
  Administered 2024-03-19: 13:00:00 12.5 mg via INTRAVENOUS

## 2024-03-19 MED ORDER — HYDROMORPHONE 0.5MG/0.5ML IJ SOLN
1 | Freq: Once | Status: AC
Start: 2024-03-19 — End: 2024-03-19
  Administered 2024-03-19: 06:00:00 0.5 mg via INTRAVENOUS

## 2024-03-19 MED ORDER — ONDANSETRON HCL 4 MG/2ML IJ SOLN
4 | Freq: Once | INTRAMUSCULAR | Status: AC
Start: 2024-03-19 — End: 2024-03-19
  Administered 2024-03-19: 12:00:00 4 mg via INTRAVENOUS

## 2024-03-19 MED ORDER — EPOETIN ALFA-EPBX 10000 UNIT/ML IJ SOLN
10000 | INTRAMUSCULAR | Status: DC
Start: 2024-03-19 — End: 2024-03-19

## 2024-03-19 MED ORDER — HYDROMORPHONE 0.5MG/0.5ML IJ SOLN
1 | Freq: Once | Status: AC
Start: 2024-03-19 — End: 2024-03-18
  Administered 2024-03-19: 02:00:00 0.5 mg via INTRAVENOUS

## 2024-03-19 MED ORDER — ONDANSETRON HCL 4 MG/2ML IJ SOLN
4 | Freq: Once | INTRAMUSCULAR | Status: AC
Start: 2024-03-19 — End: 2024-03-19
  Administered 2024-03-19: 07:00:00 4 mg via INTRAVENOUS

## 2024-03-19 MED FILL — HYDROMORPHONE HCL 1 MG/ML IJ SOLN: 1 mg/mL | INTRAMUSCULAR | Qty: 0.5 | Fill #0

## 2024-03-19 MED FILL — ONDANSETRON HCL 4 MG/2ML IJ SOLN: 4 MG/2ML | INTRAMUSCULAR | Qty: 2 | Fill #0

## 2024-03-19 MED FILL — DIPHENHYDRAMINE HCL 50 MG/ML IJ SOLN: 50 mg/mL | INTRAMUSCULAR | Qty: 1 | Fill #0

## 2024-03-19 MED FILL — ULTRABAG/DIANEAL/2.5% DEXTROSE 395 MOSM/L IP SOLN: 395 MOSM/L | INTRAPERITONEAL | Qty: 2000 | Fill #0

## 2024-03-19 MED FILL — RETACRIT 10000 UNIT/ML IJ SOLN: 10000 [IU]/mL | INTRAMUSCULAR | Qty: 1 | Fill #0

## 2024-03-19 MED FILL — ALBUTEIN 25 % IV SOLN: 25 % | INTRAVENOUS | Qty: 100 | Fill #0

## 2024-03-19 NOTE — Flowsheet Note (Signed)
 "PRE HD TREATMENT   03/19/24 0800   Treatment   Treatment Goal   (3.5L per pt's request)   Observations & Evaluations   Level of Consciousness 0   Oriented X 4   Heart Rhythm Regular   Respiratory Quality/Effort Unlabored   O2 Device None (Room air)   Bilateral Breath Sounds Clear   Skin Color   (appropriate for ethnicity)   Skin Condition/Temp Warm   Abdomen Inspection Soft   Edema Right lower extremity;Left lower extremity   RLE Edema +1   LLE Edema +1   Vital Signs   BP (!) 166/100   Temp 98.5 F (36.9 C)   Pulse 74   Respirations 18   SpO2 95 %   Pain Assessment   Pain Assessment 0-10   Pain Level 0   Patient's Stated Pain Goal 0 - No pain   Technical Checks   Dialysis Machine No. [id number]   RO Machine Number [id number]   Dialyzer Lot No. [id number]   Tubing Lot Number [id number]   All Connections Secure Yes   NS Bag Yes   Saline Line Double Clamped Yes   Dialyzer F-180   Prime Volume (mL) 200 mL   ICEBOAT I;C;E;B;O;A;T   RO Machine Log Sheet Completed Yes   Machine Alarm Self Test Completed;Passed   Child Psychotherapist Function;pH Reading   Sport And Exercise Psychologist Conductivity 13.6   Manual Ph 7.2   Bleach Test (Neg) Yes   Bath Temperature 96.8 F (36 C)   Treatment Initiation   Dialyze Hours 3   Treatment  Initiation Universal Precautions maintained;Lines secured to patient;Connections secured;Prime given;Venous Parameters set;Arterial Parameters set;It consultant engaged;[place] Device;Dialysate;Saline line double clamped;Hemo-Safe Applied;Dialyzer;F180   Dialysis Bath   K+ (Potassium) 3  (Potassium of 4.3 03/19/2024)   Ca+ (Calcium ) 2.5   Na+ (Sodium) 138   HCO3 (Bicarb) 38   Bicarbonate Concentrate Lot No. [id number]   Acid Concentrate Lot No. [id number]   Handoff   Communication Given   (pre hd report)   Handoff Given To K. Chanse Kagel, RN   Handoff Received From A. Rosana, RN   Handoff Communication Face to Face     Primary RN SBAR: Claretta Rosana,  RN  Incapacitated Nurse edu. provided:yes  Patient Education provided: HD tx order, UF goal  Preferred Education method and Primary language: Verbal, ENglish  Dialysis consent: HD clinic consent applies  Hospital General Consent Verified: yes  Hospital associated wait time; reason: none  Patient to Staff Ratio (1:1 or 2:1) : 1:1  Rationale for 1:1:  Contact isolation    Hepatitis B Surface Ag   Date/Time Value Ref Range Status   03/19/2024 08:28 AM NONREACTIVE NR   Final     Hep B S Ag Interp   Date/Time Value Ref Range Status   05/07/2022 09:35 PM Negative NEG   Final     Hep B S Ab   Date/Time Value Ref Range Status   03/19/2024 08:28 AM 144.00 mIU/mL Final     Comment:        <10.00 mIU/mL     Non-Immune  =>10.00 mIU/mL    Individual is considered to be immune to infection with HBV       Hep B S Ab Interp   Date/Time Value Ref Range Status   05/07/2022 09:35 PM REACTIVE (A) NR   Final     Hep B results, dates, and Primary Source: NEG/IMM 03/19/2024, Epic  Hep B dual verification performed by (RN): Carren Lederer, RN  Machine disinfect process: Standard heat    HD INITIATION   03/19/24 0838   During Hemodialysis Assessment   BP 131/75   Pulse 76   Blood Flow Rate (ml/min) 200 ml/min   Arterial Pressure (mmHg) -100 mmHg   Venous Pressure (mmHg) 140   TMP 70   DFR 600   Comments HD tx initiated   Access Visible Yes   Ultrafiltration Rate (ml/hr) 1330 ml/hr   Ultrafiltration Removed (ml) 0 ml      03/19/24 0905   During Hemodialysis Assessment   BP (!) 102/54   Pulse 77   Blood Flow Rate (ml/min) 400 ml/min   Arterial Pressure (mmHg) -160 mmHg   Venous Pressure (mmHg) 170   TMP 70   DFR 600   Comments UF goal lowered   Access Visible Yes   Ultrafiltration Rate (ml/hr) 1140 ml/hr   Ultrafiltration Removed (ml) 556 ml      03/19/24 0915   During Hemodialysis Assessment   BP (!) 100/50   Pulse 77   Blood Flow Rate (ml/min) 400 ml/min   Arterial Pressure (mmHg) -170 mmHg   Venous Pressure (mmHg) 170   TMP 70   DFR 600    Comments UF turned off   Access Visible Yes   Ultrafiltration Removed (ml) 703 ml      03/19/24 0920   During Hemodialysis Assessment   BP (!) 95/48   Pulse 75   Comments Requested for PRN albumin       03/19/24 0930   During Hemodialysis Assessment   BP (!) 89/49   Pulse 74   Comments UF still off. Waiting for albumin . Flushed with 100 Saline      03/19/24 0940   During Hemodialysis Assessment   BP 115/68   Pulse 73   Blood Flow Rate (ml/min) 400 ml/min   Arterial Pressure (mmHg) -170 mmHg   Venous Pressure (mmHg) 200   TMP 70   DFR 600   Comments BP better. Held off albumin    Access Visible Yes   Ultrafiltration Rate (ml/hr) 1140 ml/hr   Ultrafiltration Removed (ml) 769 ml      03/19/24 1000   During Hemodialysis Assessment   BP (!) 97/56   Pulse 69   Blood Flow Rate (ml/min) 400 ml/min   Arterial Pressure (mmHg) -170 mmHg   Venous Pressure (mmHg) 210   TMP 70   DFR 700   Comments Albumin  initiated   Access Visible Yes   Ultrafiltration Rate (ml/hr) 960 ml/hr   Ultrafiltration Removed (ml) 1103 ml      03/19/24 1139   During Hemodialysis Assessment   BP (!) 112/99   Pulse 68   Blood Flow Rate (ml/min) 400 ml/min   Arterial Pressure (mmHg) -190 mmHg   Venous Pressure (mmHg) 220   TMP 70   DFR 700   Comments HD tx completed   Access Visible Yes   Ultrafiltration Rate (ml/hr) 960 ml/hr   Ultrafiltration Removed (ml) 2700 ml     POST HD TREATMENT     03/19/24 1156   Treatment   Time On 0838   Time Off 1139   Observations & Evaluations   Level of Consciousness 0   Oriented X 4   Heart Rhythm Regular   Respiratory Quality/Effort Unlabored   O2 Device None (Room air)   Bilateral Breath Sounds Clear   Skin Color   (appropriate for ethnicity)   Skin  Condition/Temp Warm   Abdomen Inspection Soft  (PD cath)   RLE Edema Trace   LLE Edema Trace   Vital Signs   BP (!) 156/98   Pulse 71   Respirations 17   SpO2 100 %   Pain Assessment   Pain Assessment 0-10   Patient's Stated Pain Goal 0 - No pain   Pain Location  Generalized;Abdomen   Pain Descriptors Aching   Hemodialysis Fistula/Graft Arteriovenous fistula Right Arm   Placement Date/Time: 05/07/22 0820   Present on Admission/Arrival: Yes  Access Type: Arteriovenous fistula  Orientation: Right  Access Location: Arm   Site Assessment Clean, dry & intact   Thrill Present   Bruit Present   Status Deaccessed   Dressing Intervention New;Dressing changed   Dressing Status New dressing applied;Clean, dry & intact   Post-Hemodialysis Assessment   Post-Treatment Procedures Blood returned;Access bleeding time < 10 minutes   Machine Disinfection Process Acid/Vinegar Clean;Heat Disinfect;Exterior Engineer, Agricultural Volume (ml) 300 ml   Blood Volume Processed (Liters) 67 L   Dialyzer Clearance Lightly streaked   Duration of Treatment (minutes) 180 minutes   Heparin Amount Administered During Treatment (mL) 0 mL   Hemodialysis Intake (ml) 700 ml  (100 saline, 100 mL albumin )   Hemodialysis Output (ml) 2700 ml   NET Removed (ml) 2000   Tolerated Treatment Fair   Interventions Taken Medication;Ultrafiltration stopped;Ultrafiltration goal decreased   Patient Response to Treatment BP stable after albumin    Patient Disposition Remain in ICU/ED   Handoff   Communication Given   (post hd report)   Handoff Given To Sari, RN   Handoff Received From K. Daniell Paradise, RN   Handoff Communication Face to Face     Primary RN SBAR: Claretta Grave, RN/Wendy, RN  Comments: HD tx completed and tolerated fairly. Pt requested for 3L UF but unable to tolerate. Albumin  25 g given for BP support. UF removed 2L net. AVF access functioning well without issue. All possible blood returned. Needles removed and pressure held until hemostasis achieved. New dressing applied. AV access with good thrill and bruit. NO SOB. VSS.  "

## 2024-03-19 NOTE — Dialysis (Signed)
"  This dialysis RN went to patient BS for PD flush and to collect sample for cell count and culture. Discussed with primary RN patient is for discharge now and wont do the procedure. MD notified Dr. Charlena is aware of the situation.  "

## 2024-03-19 NOTE — Consults (Signed)
 "    [facility]   [address] [place], [place] [address]  Phone: [phone]   [phone]    [email or web address]     Nephrology Progress Note    Patient Name : Meredith Hawkins     DOB : 1985-08-08     MRN : [id number]  Date of Admission : 03/18/2024  Date of Servive : 03/19/24    CC:  Follow up for ESRD       Assessment and Plan   ESRD on HD TTS [facility] in [place]  Prior PD pt, has been off since the beginning of December  Hx of peritonitis  Hemorrhagic renal cyst  Diverticulitis  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 Recently on PD, but given a break due to recurrent peritonitis.  Visiting from [facility], last HD was Saturday.  Came into the ED for R flank pain and to get her routine dialysis.  CT A/P w/o neg for acute issues.  Last PD cath flush was last week.  No fevers, chills, n/v/d reported    Review of Systems: Pertinent items are noted in HPI.    Current Medications:   No current facility-administered medications for this encounter.     Current Outpatient Medications   Medication Sig    hydrALAZINE  (APRESOLINE ) 25 MG tablet Take 1 tablet by mouth 3 times daily    metoprolol  succinate (TOPROL  XL) 25 MG extended release tablet Take 1 tablet by mouth daily    pantoprazole  (PROTONIX ) 40 MG tablet Take 1 tablet by mouth daily    amLODIPine  (NORVASC ) 10 MG tablet Take 1 tablet by mouth daily    calcium  acetate (PHOSLO ) 667 MG CAPS capsule Take 1 capsule by mouth 3 times daily (with meals)    apixaban  (ELIQUIS ) 2.5 MG TABS tablet Take 1 tablet by mouth 2 times daily      Allergies   Allergen Reactions    Ace Inhibitors Anaphylaxis     Pt states all of these have closed my  throat    Acetaminophen Anaphylaxis     Pt states all of these have closed my throat    Codeine Anaphylaxis     Pt states all of these have closed my throat    Fentanyl Anaphylaxis    Hydrocodone Anaphylaxis     Pt states all of these have closed my throat    Nsaids Other (See Comments)     pcp states not to take due to kidneys     Oxycodone Anaphylaxis     Pt states all of these have closed my throat    Sulfa Antibiotics Anaphylaxis       Objective:  Vitals:    Vitals:    03/19/24 0838 03/19/24 0845 03/19/24 0900 03/19/24 0905   BP: 131/75 116/79 (!) 102/59 (!) 102/54   Pulse: 76 75 81 77   Resp:       Temp:       TempSrc:       SpO2:       Weight:  Intake and Output:  No intake/output data recorded.  No intake/output data recorded.    Physical Examination:  General: NAD,Conversant   Neck:  Supple, no mass  Resp:  Lungs CTA B/L, no wheezing , normal respiratory effort  CV:  RRR,  no murmur or rub, LE edema  GI:  Soft, NT, + BS, no HS megaly, PD cath exit site clean  Neurologic:  Non focal  Psych:             AAO x 3 appropriate affect   Skin:  No Rash  Dialysis Access : RUE AVF    []     High complexity decision making was performed  []     Patient is at high-risk of decompensation with multiple organ involvement    Lab Data Personally Reviewed: I have reviewed all the pertinent labs, microbiology data and radiology studies during assessment.    Labs:  Recent Labs     03/18/24  1823 03/18/24  2112   NA 133*  --    K Hemolyzed, Recollection Recommended 4.3   CL 92*  --    CO2 21  --    GLUCOSE 105*  --    BUN 39*  --    CREATININE 8.74*  --    CALCIUM  8.9  --        Recent Labs     03/18/24  1823   WBC 4.7   RBC 3.16*   HGB 9.4*   HCT 28.4*   MCV 89.9   MCH 29.7   MCHC 33.1   RDW 15.4*   PLT 233   MPV 10.0     Recent Labs     03/18/24  1823   GLOB 3.6     No results for input(s): INR, APTT in the last 72 hours.    Invalid input(s): PTP   No results for input(s): CPK, CKMB, TROPONINI in  the last 72 hours.    Invalid input(s): B-NP  Invalid input(s): PHI, PCO2I, PO2I, FIO2I       Ventilator:       Microbiology:  No results found for: SDES  No components found for: CULT      I have reviewed the flowsheets.  Chart and Pertinent Notes have been reviewed.   No change in PMH ,family and social history from Consult note.      Manus Severe, MD  [facility]     "

## 2024-03-19 NOTE — Dialysis (Signed)
"    Hepatitis B Surface Ag   Date/Time Value Ref Range Status   03/19/2024 08:28 AM NONREACTIVE NR   Final     Hep B S Ag Interp   Date/Time Value Ref Range Status   05/07/2022 09:35 PM Negative NEG   Final     Hep B S Ab   Date/Time Value Ref Range Status   03/19/2024 08:28 AM 144.00 mIU/mL Final     Comment:        <10.00 mIU/mL     Non-Immune  =>10.00 mIU/mL    Individual is considered to be immune to infection with HBV       Hep B S Ab Interp   Date/Time Value Ref Range Status   05/07/2022 09:35 PM REACTIVE (A) NR   Final     Hep B results, dates, and Primary Source: 03/19/24 Negative/Immune/Epic  Hep B dual verification performed for (RN): oncoming [facility] nurse  Machine disinfect process:standard    "

## 2024-03-19 NOTE — Flowsheet Note (Signed)
"     03/19/24 0400   Daily Charge   $ Daily PD Charge $ Yes   Initiation of PD Therapy   Dialysis Nurse Intiation of PD Therapy Yes   Observations & Evaluations   Level of Consciousness 0   Oriented X 4   Respiratory Quality/Effort Unlabored   O2 Device None (Room air)   Skin Color Other (comment)  (Normal for ethnicity)   Skin Condition/Temp Warm;Dry   Edema None   Vitals   BP (!) 143/87   Pulse 77   Respirations 11   ICEBOAT   ICEBOAT I;C;E;B;O;A;T   Peritoneal Dialysis Catheter Left lower abdomen   No placement date or time found.   Present on Admission/Arrival: Yes  Catheter Location: Left lower abdomen   Status Accessed   Site Condition Clean, dry, intact   Dressing Status New dressing applied  (Exit site cleansed with Exsept.  Dressed with dry/sterile gauze.)   Dressing Gauze   Date of Last Dressing Change 03/19/24   Catheter Care Given Yes     Primary RN SBAR: Lauraine Eastern RN  Comments: Called to collect peritoneal fluid for cell count and culture.  On arrival the patients catheter was found to have no mini cap present.  The patient has two transfer sets attached in succession, first and [employer] set and then a [employer].  Baxter extension set changed per [facility] policy..  A two minute Alcavis scrub/soak was then performed and a sterile syringe attached for sample collection.  No effluent was present.  Two minute Alcavis scrub/soak repeated and syringe detached.  Sterile mini cap placed.  PD catheter secured by the patient in elastic belt.  Above discussed with the primary RN and with Rexene Castor NP.  No new orders at present.     "

## 2024-03-19 NOTE — Progress Notes (Signed)
"  Consult received for hemodialysis, ESRD on HD TTS  -Chart reviewed discussed w/ ED Dr. Phylis  -Patient visiting from [place] , presented w/ abd pain, has prior PD cath. Plans to send PD fluid. Will arrange for routine hemodialysis tomorrow, discussed w/ [facility] HD nurse on-call.  "
